# Patient Record
Sex: Male | Born: 1935
Health system: Southern US, Community
[De-identification: ages and names within clinical notes are randomized; demographics above are authoritative.]

## PROBLEM LIST (undated history)

## (undated) DIAGNOSIS — I2510 Atherosclerotic heart disease of native coronary artery without angina pectoris: Secondary | ICD-10-CM

## (undated) DIAGNOSIS — Z77090 Contact with and (suspected) exposure to asbestos: Secondary | ICD-10-CM

## (undated) DIAGNOSIS — F419 Anxiety disorder, unspecified: Secondary | ICD-10-CM

## (undated) DIAGNOSIS — C61 Malignant neoplasm of prostate: Secondary | ICD-10-CM

## (undated) DIAGNOSIS — J309 Allergic rhinitis, unspecified: Secondary | ICD-10-CM

## (undated) DIAGNOSIS — I10 Essential (primary) hypertension: Secondary | ICD-10-CM

## (undated) DIAGNOSIS — J189 Pneumonia, unspecified organism: Secondary | ICD-10-CM

## (undated) DIAGNOSIS — E0590 Thyrotoxicosis, unspecified without thyrotoxic crisis or storm: Secondary | ICD-10-CM

## (undated) DIAGNOSIS — K219 Gastro-esophageal reflux disease without esophagitis: Secondary | ICD-10-CM

## (undated) DIAGNOSIS — M1990 Unspecified osteoarthritis, unspecified site: Secondary | ICD-10-CM

## (undated) DIAGNOSIS — I739 Peripheral vascular disease, unspecified: Secondary | ICD-10-CM

## (undated) DIAGNOSIS — E039 Hypothyroidism, unspecified: Secondary | ICD-10-CM

## (undated) DIAGNOSIS — M545 Low back pain, unspecified: Secondary | ICD-10-CM

## (undated) DIAGNOSIS — I6529 Occlusion and stenosis of unspecified carotid artery: Secondary | ICD-10-CM

## (undated) DIAGNOSIS — D649 Anemia, unspecified: Secondary | ICD-10-CM

## (undated) DIAGNOSIS — H9190 Unspecified hearing loss, unspecified ear: Secondary | ICD-10-CM

## (undated) DIAGNOSIS — F0390 Unspecified dementia without behavioral disturbance: Secondary | ICD-10-CM

## (undated) DIAGNOSIS — C649 Malignant neoplasm of unspecified kidney, except renal pelvis: Secondary | ICD-10-CM

## (undated) DIAGNOSIS — F32A Depression, unspecified: Secondary | ICD-10-CM

## (undated) DIAGNOSIS — L409 Psoriasis, unspecified: Secondary | ICD-10-CM

## (undated) DIAGNOSIS — K649 Unspecified hemorrhoids: Secondary | ICD-10-CM

## (undated) DIAGNOSIS — E782 Mixed hyperlipidemia: Secondary | ICD-10-CM

## (undated) DIAGNOSIS — F329 Major depressive disorder, single episode, unspecified: Secondary | ICD-10-CM

## (undated) DIAGNOSIS — J449 Chronic obstructive pulmonary disease, unspecified: Secondary | ICD-10-CM

## (undated) DIAGNOSIS — Z8619 Personal history of other infectious and parasitic diseases: Secondary | ICD-10-CM

## (undated) HISTORY — PX: CORONARY ANGIOPLASTY WITH STENT PLACEMENT: SHX49

## (undated) HISTORY — PX: POPLITEAL SYNOVIAL CYST EXCISION: SUR555

## (undated) HISTORY — DX: Atherosclerotic heart disease of native coronary artery without angina pectoris: I25.10

## (undated) HISTORY — PX: NASAL SEPTUM SURGERY: SHX37

## (undated) HISTORY — PX: ESOPHAGOGASTRODUODENOSCOPY: SHX1529

## (undated) HISTORY — DX: Malignant neoplasm of unspecified kidney, except renal pelvis: C64.9

## (undated) HISTORY — DX: Anemia, unspecified: D64.9

## (undated) HISTORY — DX: Depression, unspecified: F32.A

## (undated) HISTORY — DX: Contact with and (suspected) exposure to asbestos: Z77.090

## (undated) HISTORY — PX: COLONOSCOPY: SHX174

## (undated) HISTORY — DX: Thyrotoxicosis, unspecified without thyrotoxic crisis or storm: E05.90

## (undated) HISTORY — DX: Malignant neoplasm of prostate: C61

## (undated) HISTORY — PX: KNEE ARTHROPLASTY: SHX992

## (undated) HISTORY — PX: JOINT REPLACEMENT: SHX530

## (undated) HISTORY — DX: Peripheral vascular disease, unspecified: I73.9

## (undated) HISTORY — PX: CATARACT EXTRACTION W/ INTRAOCULAR LENS  IMPLANT, BILATERAL: SHX1307

## (undated) HISTORY — PX: CAROTID ENDARTERECTOMY: SUR193

## (undated) HISTORY — DX: Allergic rhinitis, unspecified: J30.9

## (undated) HISTORY — DX: Personal history of other infectious and parasitic diseases: Z86.19

## (undated) HISTORY — DX: Essential (primary) hypertension: I10

## (undated) HISTORY — PX: CARDIAC CATHETERIZATION: SHX172

## (undated) HISTORY — DX: Mixed hyperlipidemia: E78.2

## (undated) HISTORY — DX: Major depressive disorder, single episode, unspecified: F32.9

## (undated) HISTORY — DX: Occlusion and stenosis of unspecified carotid artery: I65.29

## (undated) HISTORY — DX: Chronic obstructive pulmonary disease, unspecified: J44.9

---

## 1976-05-01 DIAGNOSIS — E0590 Thyrotoxicosis, unspecified without thyrotoxic crisis or storm: Secondary | ICD-10-CM

## 1976-05-01 HISTORY — DX: Thyrotoxicosis, unspecified without thyrotoxic crisis or storm: E05.90

## 1993-10-29 HISTORY — PX: LAPAROSCOPIC CHOLECYSTECTOMY: SUR755

## 1999-05-02 DIAGNOSIS — Z8619 Personal history of other infectious and parasitic diseases: Secondary | ICD-10-CM

## 1999-05-02 HISTORY — DX: Personal history of other infectious and parasitic diseases: Z86.19

## 2000-02-03 ENCOUNTER — Encounter: Admission: RE | Admit: 2000-02-03 | Discharge: 2000-05-03 | Payer: Self-pay | Admitting: Anesthesiology

## 2000-08-17 ENCOUNTER — Encounter: Admission: RE | Admit: 2000-08-17 | Discharge: 2000-11-15 | Payer: Self-pay | Admitting: Anesthesiology

## 2001-11-29 ENCOUNTER — Inpatient Hospital Stay (HOSPITAL_COMMUNITY): Admission: EM | Admit: 2001-11-29 | Discharge: 2001-12-04 | Payer: Self-pay | Admitting: Dentistry

## 2001-12-17 ENCOUNTER — Inpatient Hospital Stay (HOSPITAL_COMMUNITY): Admission: EM | Admit: 2001-12-17 | Discharge: 2001-12-19 | Payer: Self-pay | Admitting: *Deleted

## 2002-03-28 ENCOUNTER — Ambulatory Visit (HOSPITAL_COMMUNITY): Admission: RE | Admit: 2002-03-28 | Discharge: 2002-03-28 | Payer: Self-pay | Admitting: Internal Medicine

## 2002-07-31 HISTORY — PX: BACK SURGERY: SHX140

## 2002-07-31 HISTORY — PX: ANTERIOR CERVICAL DECOMP/DISCECTOMY FUSION: SHX1161

## 2002-08-01 ENCOUNTER — Encounter: Payer: Self-pay | Admitting: Neurosurgery

## 2002-08-05 ENCOUNTER — Encounter: Payer: Self-pay | Admitting: Neurosurgery

## 2002-08-05 ENCOUNTER — Inpatient Hospital Stay (HOSPITAL_COMMUNITY): Admission: RE | Admit: 2002-08-05 | Discharge: 2002-08-06 | Payer: Self-pay | Admitting: Neurosurgery

## 2002-10-20 ENCOUNTER — Encounter: Admission: RE | Admit: 2002-10-20 | Discharge: 2002-10-20 | Payer: Self-pay | Admitting: Neurosurgery

## 2002-10-20 ENCOUNTER — Encounter: Payer: Self-pay | Admitting: Neurosurgery

## 2002-12-16 ENCOUNTER — Inpatient Hospital Stay (HOSPITAL_COMMUNITY): Admission: EM | Admit: 2002-12-16 | Discharge: 2002-12-17 | Payer: Self-pay | Admitting: Cardiology

## 2003-10-30 HISTORY — PX: PARTIAL KNEE ARTHROPLASTY: SHX2174

## 2003-11-26 ENCOUNTER — Inpatient Hospital Stay (HOSPITAL_COMMUNITY): Admission: RE | Admit: 2003-11-26 | Discharge: 2003-11-29 | Payer: Self-pay | Admitting: Orthopedic Surgery

## 2004-02-11 ENCOUNTER — Inpatient Hospital Stay (HOSPITAL_COMMUNITY): Admission: AD | Admit: 2004-02-11 | Discharge: 2004-02-18 | Payer: Self-pay | Admitting: Cardiology

## 2004-02-29 ENCOUNTER — Inpatient Hospital Stay (HOSPITAL_COMMUNITY): Admission: EM | Admit: 2004-02-29 | Discharge: 2004-03-01 | Payer: Self-pay | Admitting: *Deleted

## 2004-02-29 ENCOUNTER — Ambulatory Visit: Payer: Self-pay | Admitting: *Deleted

## 2004-03-11 ENCOUNTER — Ambulatory Visit: Payer: Self-pay | Admitting: Cardiology

## 2004-04-20 ENCOUNTER — Ambulatory Visit: Payer: Self-pay | Admitting: Cardiology

## 2004-05-06 ENCOUNTER — Ambulatory Visit: Payer: Self-pay | Admitting: Cardiology

## 2004-10-12 ENCOUNTER — Inpatient Hospital Stay (HOSPITAL_COMMUNITY): Admission: EM | Admit: 2004-10-12 | Discharge: 2004-10-13 | Payer: Self-pay | Admitting: Emergency Medicine

## 2004-10-12 ENCOUNTER — Ambulatory Visit: Payer: Self-pay | Admitting: Cardiology

## 2004-11-10 ENCOUNTER — Ambulatory Visit: Payer: Self-pay | Admitting: Cardiology

## 2005-04-03 ENCOUNTER — Ambulatory Visit: Payer: Self-pay | Admitting: Cardiology

## 2005-04-25 ENCOUNTER — Ambulatory Visit: Payer: Self-pay | Admitting: Cardiology

## 2005-09-07 ENCOUNTER — Ambulatory Visit: Payer: Self-pay | Admitting: Cardiology

## 2005-09-07 ENCOUNTER — Observation Stay (HOSPITAL_COMMUNITY): Admission: EM | Admit: 2005-09-07 | Discharge: 2005-09-10 | Payer: Self-pay | Admitting: Emergency Medicine

## 2005-09-14 ENCOUNTER — Ambulatory Visit: Payer: Self-pay | Admitting: Cardiology

## 2006-02-13 ENCOUNTER — Ambulatory Visit: Payer: Self-pay | Admitting: Cardiovascular Disease

## 2006-02-21 ENCOUNTER — Ambulatory Visit: Payer: Self-pay | Admitting: Cardiovascular Disease

## 2006-02-21 ENCOUNTER — Ambulatory Visit (HOSPITAL_COMMUNITY): Admission: RE | Admit: 2006-02-21 | Discharge: 2006-02-21 | Payer: Self-pay | Admitting: Cardiovascular Disease

## 2006-04-27 ENCOUNTER — Ambulatory Visit: Admission: RE | Admit: 2006-04-27 | Discharge: 2006-07-16 | Payer: Self-pay | Admitting: Radiation Oncology

## 2006-05-28 ENCOUNTER — Ambulatory Visit: Payer: Self-pay | Admitting: Cardiology

## 2006-05-31 ENCOUNTER — Ambulatory Visit: Payer: Self-pay | Admitting: Cardiology

## 2006-11-06 ENCOUNTER — Ambulatory Visit: Payer: Self-pay | Admitting: Cardiology

## 2006-11-07 ENCOUNTER — Ambulatory Visit: Payer: Self-pay | Admitting: Cardiology

## 2006-11-07 ENCOUNTER — Inpatient Hospital Stay (HOSPITAL_COMMUNITY): Admission: AD | Admit: 2006-11-07 | Discharge: 2006-11-09 | Payer: Self-pay | Admitting: Cardiology

## 2006-11-13 ENCOUNTER — Ambulatory Visit: Payer: Self-pay | Admitting: Cardiology

## 2007-02-14 ENCOUNTER — Ambulatory Visit: Payer: Self-pay | Admitting: Cardiology

## 2007-06-08 ENCOUNTER — Encounter: Payer: Self-pay | Admitting: Internal Medicine

## 2007-06-08 ENCOUNTER — Ambulatory Visit: Payer: Self-pay | Admitting: Cardiology

## 2007-08-30 HISTORY — PX: EYE SURGERY: SHX253

## 2007-09-21 ENCOUNTER — Emergency Department (HOSPITAL_COMMUNITY): Admission: EM | Admit: 2007-09-21 | Discharge: 2007-09-21 | Payer: Self-pay | Admitting: Emergency Medicine

## 2007-09-24 ENCOUNTER — Encounter: Payer: Self-pay | Admitting: Internal Medicine

## 2007-10-21 ENCOUNTER — Encounter: Payer: Self-pay | Admitting: Internal Medicine

## 2007-10-21 ENCOUNTER — Ambulatory Visit: Payer: Self-pay | Admitting: Cardiology

## 2007-10-25 ENCOUNTER — Inpatient Hospital Stay (HOSPITAL_BASED_OUTPATIENT_CLINIC_OR_DEPARTMENT_OTHER): Admission: RE | Admit: 2007-10-25 | Discharge: 2007-10-25 | Payer: Self-pay | Admitting: Internal Medicine

## 2007-10-25 ENCOUNTER — Ambulatory Visit: Payer: Self-pay | Admitting: Internal Medicine

## 2007-11-12 ENCOUNTER — Ambulatory Visit: Payer: Self-pay | Admitting: Cardiology

## 2007-11-29 ENCOUNTER — Ambulatory Visit: Payer: Self-pay | Admitting: Internal Medicine

## 2007-11-29 DIAGNOSIS — Z77090 Contact with and (suspected) exposure to asbestos: Secondary | ICD-10-CM | POA: Insufficient documentation

## 2007-11-29 DIAGNOSIS — J439 Emphysema, unspecified: Secondary | ICD-10-CM | POA: Insufficient documentation

## 2007-12-07 DIAGNOSIS — J309 Allergic rhinitis, unspecified: Secondary | ICD-10-CM | POA: Insufficient documentation

## 2007-12-09 ENCOUNTER — Telehealth: Payer: Self-pay | Admitting: Internal Medicine

## 2008-02-28 ENCOUNTER — Encounter: Payer: Self-pay | Admitting: Cardiology

## 2008-03-30 ENCOUNTER — Ambulatory Visit: Payer: Self-pay | Admitting: Internal Medicine

## 2008-05-07 ENCOUNTER — Ambulatory Visit: Payer: Self-pay | Admitting: Cardiology

## 2008-05-18 ENCOUNTER — Encounter: Payer: Self-pay | Admitting: Physician Assistant

## 2008-05-26 ENCOUNTER — Encounter: Payer: Self-pay | Admitting: Cardiology

## 2008-06-03 ENCOUNTER — Ambulatory Visit: Payer: Self-pay | Admitting: Cardiology

## 2008-06-04 ENCOUNTER — Encounter: Payer: Self-pay | Admitting: Cardiology

## 2008-06-08 ENCOUNTER — Encounter: Payer: Self-pay | Admitting: Cardiology

## 2008-06-25 ENCOUNTER — Ambulatory Visit: Payer: Self-pay | Admitting: Vascular Surgery

## 2008-06-30 ENCOUNTER — Ambulatory Visit (HOSPITAL_COMMUNITY): Admission: RE | Admit: 2008-06-30 | Discharge: 2008-06-30 | Payer: Self-pay | Admitting: Urology

## 2008-07-02 ENCOUNTER — Ambulatory Visit (HOSPITAL_COMMUNITY): Admission: RE | Admit: 2008-07-02 | Discharge: 2008-07-02 | Payer: Self-pay | Admitting: Urology

## 2008-08-11 ENCOUNTER — Ambulatory Visit: Payer: Self-pay | Admitting: Cardiology

## 2008-09-02 ENCOUNTER — Ambulatory Visit (HOSPITAL_COMMUNITY): Admission: RE | Admit: 2008-09-02 | Discharge: 2008-09-02 | Payer: Self-pay | Admitting: Ophthalmology

## 2008-09-24 ENCOUNTER — Ambulatory Visit (HOSPITAL_COMMUNITY): Admission: RE | Admit: 2008-09-24 | Discharge: 2008-09-24 | Payer: Self-pay | Admitting: Ophthalmology

## 2008-10-31 ENCOUNTER — Ambulatory Visit: Payer: Self-pay | Admitting: Cardiology

## 2008-10-31 ENCOUNTER — Encounter: Payer: Self-pay | Admitting: Cardiology

## 2008-11-06 ENCOUNTER — Encounter (INDEPENDENT_AMBULATORY_CARE_PROVIDER_SITE_OTHER): Payer: Self-pay | Admitting: *Deleted

## 2008-12-17 ENCOUNTER — Ambulatory Visit: Payer: Self-pay | Admitting: *Deleted

## 2008-12-17 ENCOUNTER — Encounter: Payer: Self-pay | Admitting: Cardiology

## 2008-12-22 DIAGNOSIS — I739 Peripheral vascular disease, unspecified: Secondary | ICD-10-CM

## 2008-12-22 DIAGNOSIS — I10 Essential (primary) hypertension: Secondary | ICD-10-CM | POA: Insufficient documentation

## 2008-12-22 DIAGNOSIS — E782 Mixed hyperlipidemia: Secondary | ICD-10-CM

## 2008-12-22 DIAGNOSIS — R0989 Other specified symptoms and signs involving the circulatory and respiratory systems: Secondary | ICD-10-CM

## 2008-12-22 DIAGNOSIS — I2510 Atherosclerotic heart disease of native coronary artery without angina pectoris: Secondary | ICD-10-CM | POA: Insufficient documentation

## 2008-12-28 ENCOUNTER — Ambulatory Visit: Payer: Self-pay | Admitting: Cardiology

## 2008-12-29 ENCOUNTER — Encounter: Payer: Self-pay | Admitting: Physician Assistant

## 2009-01-15 ENCOUNTER — Encounter (INDEPENDENT_AMBULATORY_CARE_PROVIDER_SITE_OTHER): Payer: Self-pay | Admitting: *Deleted

## 2009-02-01 ENCOUNTER — Encounter: Payer: Self-pay | Admitting: Physician Assistant

## 2009-02-01 ENCOUNTER — Ambulatory Visit: Payer: Self-pay | Admitting: Cardiology

## 2009-02-02 ENCOUNTER — Encounter (INDEPENDENT_AMBULATORY_CARE_PROVIDER_SITE_OTHER): Payer: Self-pay | Admitting: *Deleted

## 2009-02-03 ENCOUNTER — Encounter (INDEPENDENT_AMBULATORY_CARE_PROVIDER_SITE_OTHER): Payer: Self-pay | Admitting: *Deleted

## 2009-02-05 ENCOUNTER — Inpatient Hospital Stay (HOSPITAL_BASED_OUTPATIENT_CLINIC_OR_DEPARTMENT_OTHER): Admission: RE | Admit: 2009-02-05 | Discharge: 2009-02-05 | Payer: Self-pay | Admitting: Cardiovascular Disease

## 2009-02-05 ENCOUNTER — Ambulatory Visit: Payer: Self-pay | Admitting: Cardiovascular Disease

## 2009-02-25 ENCOUNTER — Ambulatory Visit: Payer: Self-pay | Admitting: Cardiology

## 2009-02-25 DIAGNOSIS — F329 Major depressive disorder, single episode, unspecified: Secondary | ICD-10-CM

## 2009-05-01 HISTORY — PX: TOTAL KNEE ARTHROPLASTY: SHX125

## 2009-06-16 ENCOUNTER — Ambulatory Visit: Payer: Self-pay | Admitting: Vascular Surgery

## 2009-07-08 ENCOUNTER — Telehealth (INDEPENDENT_AMBULATORY_CARE_PROVIDER_SITE_OTHER): Payer: Self-pay | Admitting: *Deleted

## 2009-08-05 ENCOUNTER — Encounter: Payer: Self-pay | Admitting: Physician Assistant

## 2009-08-09 ENCOUNTER — Ambulatory Visit: Payer: Self-pay | Admitting: Internal Medicine

## 2009-08-12 ENCOUNTER — Encounter: Payer: Self-pay | Admitting: Internal Medicine

## 2009-10-01 ENCOUNTER — Encounter: Payer: Self-pay | Admitting: Cardiology

## 2009-10-05 ENCOUNTER — Encounter: Payer: Self-pay | Admitting: Internal Medicine

## 2009-10-07 ENCOUNTER — Ambulatory Visit: Payer: Self-pay | Admitting: Physician Assistant

## 2009-10-08 ENCOUNTER — Ambulatory Visit: Payer: Self-pay | Admitting: Internal Medicine

## 2009-10-08 DIAGNOSIS — F17200 Nicotine dependence, unspecified, uncomplicated: Secondary | ICD-10-CM

## 2009-11-29 ENCOUNTER — Inpatient Hospital Stay (HOSPITAL_COMMUNITY): Admission: RE | Admit: 2009-11-29 | Discharge: 2009-12-02 | Payer: Self-pay | Admitting: Orthopedic Surgery

## 2009-12-20 ENCOUNTER — Telehealth (INDEPENDENT_AMBULATORY_CARE_PROVIDER_SITE_OTHER): Payer: Self-pay | Admitting: *Deleted

## 2010-02-02 ENCOUNTER — Telehealth (INDEPENDENT_AMBULATORY_CARE_PROVIDER_SITE_OTHER): Payer: Self-pay | Admitting: *Deleted

## 2010-02-04 ENCOUNTER — Encounter: Payer: Self-pay | Admitting: Cardiology

## 2010-02-04 ENCOUNTER — Ambulatory Visit: Payer: Self-pay | Admitting: Vascular Surgery

## 2010-05-05 ENCOUNTER — Ambulatory Visit
Admission: RE | Admit: 2010-05-05 | Discharge: 2010-05-05 | Payer: Self-pay | Source: Home / Self Care | Attending: Cardiology | Admitting: Cardiology

## 2010-05-28 ENCOUNTER — Inpatient Hospital Stay (HOSPITAL_COMMUNITY)
Admission: EM | Admit: 2010-05-28 | Discharge: 2010-05-30 | Payer: Self-pay | Source: Home / Self Care | Attending: Cardiology | Admitting: Cardiology

## 2010-05-28 ENCOUNTER — Encounter: Payer: Self-pay | Admitting: Cardiology

## 2010-05-28 LAB — CBC
HCT: 34.7 % — ABNORMAL LOW (ref 39.0–52.0)
Hemoglobin: 12 g/dL — ABNORMAL LOW (ref 13.0–17.0)
MCH: 32.4 pg (ref 26.0–34.0)
MCHC: 34.6 g/dL (ref 30.0–36.0)
MCV: 93.8 fL (ref 78.0–100.0)
Platelets: 273 10*3/uL (ref 150–400)
RBC: 3.7 MIL/uL — ABNORMAL LOW (ref 4.22–5.81)
RDW: 14.1 % (ref 11.5–15.5)
WBC: 9.3 10*3/uL (ref 4.0–10.5)

## 2010-05-28 LAB — POCT CARDIAC MARKERS
CKMB, poc: <1 ng/mL — ABNORMAL LOW (ref 1.0–8.0)
CKMB, poc: <1 ng/mL — ABNORMAL LOW (ref 1.0–8.0)
Myoglobin, poc: 92 ng/mL (ref 12–200)
Myoglobin, poc: 55.5 ng/mL (ref 12–200)
Troponin i, poc: <0.05 ng/mL (ref 0.00–0.09)
Troponin i, poc: <0.05 ng/mL (ref 0.00–0.09)

## 2010-05-28 LAB — LIPID PANEL
Cholesterol: 128 mg/dL (ref 0–200)
HDL: 52 mg/dL (ref >39)
LDL Cholesterol: 65 mg/dL (ref 0–99)
Total CHOL/HDL Ratio: 2.5 RATIO
Triglycerides: 55 mg/dL (ref <150)
VLDL: 11 mg/dL (ref 0–40)

## 2010-05-28 LAB — DIFFERENTIAL
Basophils Absolute: 0.1 10*3/uL (ref 0.0–0.1)
Basophils Relative: 1 % (ref 0–1)
Eosinophils Absolute: 0.3 10*3/uL (ref 0.0–0.7)
Eosinophils Relative: 3 % (ref 0–5)
Lymphocytes Relative: 16 % (ref 12–46)
Lymphs Abs: 1.5 10*3/uL (ref 0.7–4.0)
Monocytes Absolute: 0.6 10*3/uL (ref 0.1–1.0)
Monocytes Relative: 7 % (ref 3–12)
Neutro Abs: 6.8 10*3/uL (ref 1.7–7.7)
Neutrophils Relative %: 74 % (ref 43–77)

## 2010-05-28 LAB — BASIC METABOLIC PANEL
BUN: 10 mg/dL (ref 6–23)
CO2: 25 mEq/L (ref 19–32)
Calcium: 9.2 mg/dL (ref 8.4–10.5)
Chloride: 99 mEq/L (ref 96–112)
Creatinine, Ser: 1.07 mg/dL (ref 0.4–1.5)
GFR calc Af Amer: >60 mL/min (ref >60)
GFR calc non Af Amer: >60 mL/min (ref >60)
Glucose, Bld: 104 mg/dL — ABNORMAL HIGH (ref 70–99)
Potassium: 3.6 mEq/L (ref 3.5–5.1)
Sodium: 134 mEq/L — ABNORMAL LOW (ref 135–145)

## 2010-05-28 LAB — BRAIN NATRIURETIC PEPTIDE: Pro B Natriuretic peptide (BNP): <30 pg/mL (ref 0.0–100.0)

## 2010-05-28 LAB — CK TOTAL AND CKMB (NOT AT ARMC)
CK, MB: 1.2 ng/mL (ref 0.3–4.0)
Relative Index: INVALID (ref 0.0–2.5)
Total CK: 61 U/L (ref 7–232)

## 2010-05-28 LAB — TSH: TSH: 2.165 u[IU]/mL (ref 0.350–4.500)

## 2010-05-28 LAB — PROTIME-INR
INR: 0.94 (ref 0.00–1.49)
Prothrombin Time: 12.8 seconds (ref 11.6–15.2)

## 2010-05-28 LAB — MRSA PCR SCREENING: MRSA by PCR: NEGATIVE

## 2010-05-28 LAB — TROPONIN I: Troponin I: <0.01 ng/mL (ref 0.00–0.06)

## 2010-05-28 LAB — APTT: aPTT: 33 seconds (ref 24–37)

## 2010-05-29 ENCOUNTER — Encounter: Payer: Self-pay | Admitting: Cardiology

## 2010-05-29 LAB — CBC
HCT: 37.6 % — ABNORMAL LOW (ref 39.0–52.0)
Hemoglobin: 12.5 g/dL — ABNORMAL LOW (ref 13.0–17.0)
Hemoglobin: 12.7 g/dL — ABNORMAL LOW (ref 13.0–17.0)
MCH: 32.2 pg (ref 26.0–34.0)
MCHC: 33.7 g/dL (ref 30.0–36.0)
MCHC: 33.8 g/dL (ref 30.0–36.0)
MCV: 95.9 fL (ref 78.0–100.0)
RDW: 14.4 % (ref 11.5–15.5)

## 2010-05-29 LAB — CK TOTAL AND CKMB (NOT AT ARMC)
CK, MB: 0.9 ng/mL (ref 0.3–4.0)
CK, MB: 1 ng/mL (ref 0.3–4.0)
Relative Index: INVALID (ref 0.0–2.5)
Total CK: 48 U/L (ref 7–232)

## 2010-05-29 LAB — TROPONIN I
Troponin I: 0.01 ng/mL (ref 0.00–0.06)
Troponin I: 0.03 ng/mL (ref 0.00–0.06)

## 2010-05-29 LAB — HEPARIN LEVEL (UNFRACTIONATED)
Heparin Unfractionated: 0.55 IU/mL (ref 0.30–0.70)
Heparin Unfractionated: <0.1 IU/mL — ABNORMAL LOW (ref 0.30–0.70)

## 2010-05-30 ENCOUNTER — Encounter: Payer: Self-pay | Admitting: Cardiology

## 2010-05-30 LAB — HEPARIN LEVEL (UNFRACTIONATED): Heparin Unfractionated: <0.1 IU/mL — ABNORMAL LOW (ref 0.30–0.70)

## 2010-05-30 LAB — CBC
HCT: 35.8 % — ABNORMAL LOW (ref 39.0–52.0)
Hemoglobin: 12.1 g/dL — ABNORMAL LOW (ref 13.0–17.0)
MCHC: 33.8 g/dL (ref 30.0–36.0)
MCV: 96 fL (ref 78.0–100.0)
WBC: 7.5 10*3/uL (ref 4.0–10.5)

## 2010-05-31 NOTE — Progress Notes (Signed)
Summary: PHONE;MEDICATIONS  Phone Note Call from Patient Call back at [phone]   Caller: Daughter-[name] Reason for Call: Talk to Nurse Summary of Call: has questions about medication and needs refills. Please call Esau Grew [phone] Initial call taken by: Zachary George,  February 02, 2010 10:00 AM  Follow-up for Phone Call        Patient notified.    Follow-up by: Hoover Brunette, LPN,  February 02, 2010 4:21 PM    Prescriptions: ISOSORBIDE MONONITRATE CR 60 MG XR24H-TAB (ISOSORBIDE MONONITRATE) Take one tablet by mouth daily  #30 x 6   Entered by:   Hoover Brunette, LPN   Authorized by:   Lewayne Bunting, MD, [facility]   Signed by:   Hoover Brunette, LPN on 84/69/6295   Method used:   Electronically to        [facility] (retail)       [address]       [place], [place]  [address]       Ph: [phone]       Fax: [phone]   RxID:   [id number]

## 2010-05-31 NOTE — Progress Notes (Signed)
Summary: phone:medication  Phone Note Call from Patient   Caller: Michael [name] Summary of Call: needs to verify dosage of Imdur  please call [name] EXT [phone] @ [facility] Initial call taken by: Zachary George,  December 20, 2009 9:11 AM  Follow-up for Phone Call        Daughter notified of correct dose, Imdur 60mg  daily. Hoover Brunette, LPN  December 21, 2009 8:51 AM

## 2010-05-31 NOTE — Letter (Signed)
Summary: [facility] Office Visit   [facility] Visit   Imported By: Roderic Ovens 02/14/2010 17:22:42  _____________________________________________________________________  External Attachment:    Type:   Image     Comment:   External Document

## 2010-05-31 NOTE — Assessment & Plan Note (Signed)
Summary: surgical clearance   Visit Type:  surgical clearance for left knee surgery Referring Provider:  Dr. Durene Romans Primary Provider:  Dr. Leandrew Koyanagi   History of Present Illness: 77 year old male, well known to [place] with history of CAD, normal LVF, and PAD, now referred for preoperative clearance.  Patient was last seen in clinic by Dr. Andee Lineman last October, following recent cardiac catheterization secondary to recurrent chest pain. This study yielded single-vessel CAD, with patent stents in the mid and distal RCA; normal LVF. At time of post hospital visit, Imdur was increased to 60 mg daily. Since then, patient has not had any recurrent angina pectoris. He has not had to take any nitroglycerin tablets. He has enrolled in the cardiac rehabilitation program, and is able to do 3 miles on a treadmill, with no associated chest pain. He is also able to climb a full flight of stairs without stopping, with only minimal exertional dyspnea. Of note, he does continue to smoke.  Preventive Screening-Counseling & Management  Alcohol-Tobacco     Smoking Status: current     Smoking Cessation Counseling: yes     Packs/Day: <1/3 PPD  Current Medications (verified): 1)  Advair Diskus 250-50 Mcg/dose  Misc (Fluticasone-Salmeterol) .... One Inhalations Two Times A Day 2)  Trazodone Hcl 100 Mg  Tabs (Trazodone Hcl) .... Take 1 By Mouth Every Evening 3)  Lipitor 40 Mg  Tabs (Atorvastatin Calcium) .... Take 1 By Mouth Once Daily 4)  Aspirin Adult Low Strength 81 Mg  Tbec (Aspirin) .... Take 1 By Mouth Once Daily 5)  Zantac 150 Mg  Tabs (Ranitidine Hcl) .... Take 1 By Mouth Two Times A Day 6)  Synthroid 125 Mcg  Tabs (Levothyroxine Sodium) .... Take 1 By Mouth Once Daily 7)  Ferrous Sulfate 325 (65 Fe) Mg Tabs (Ferrous Sulfate) .... Take 1 Tablet By Mouth Once A Day 8)  Flomax 0.4 Mg  Xr24h-Cap (Tamsulosin Hcl) .... Take 1 By Mouth Every Evening 9)  Amlodipine Besylate 10 Mg Tabs (Amlodipine Besylate) ....  Take One Tablet By Mouth Daily 10)  Hydrochlorothiazide 25 Mg Tabs (Hydrochlorothiazide) .... Take 1 By Mouth  Every Morning 11)  Celexa 20 Mg Tabs (Citalopram Hydrobromide) .... Take 1 By Mouth  Every Morning 12)  Multivitamins  Tabs (Multiple Vitamin) .... Take 1 By Mouth Once Daily 13)  Isosorbide Mononitrate Cr 60 Mg Xr24h-Tab (Isosorbide Mononitrate) .... Take One Tablet By Mouth Daily 14)  Nitroglycerin 0.4 Mg Subl (Nitroglycerin) .... Place 1 Tablet Under Tongue As Directed 15)  Fish Oil 1000 Mg Caps (Omega-3 Fatty Acids) .... Take 2 Capsules By Mouth Two Times A Day 16)  Spiriva Handihaler 18 Mcg Caps (Tiotropium Bromide Monohydrate) .Marland Kitchen.. 1 Daily  Allergies (verified): 1)  Pcn 2)  * Oxycodone 3)  Cipro 4)  * Nuiron  Comments:  Nurse/Medical Assistant: The patient's medication list and allergies were reviewed with the patient and were updated in the Medication and Allergy Lists.  Past History:  Past Medical History: Last updated: 12/22/2008 Hyperthyroid-RAI 1978 Shingles 2001 MI Prostate cancer -xrt/IMRT 2008 vitreous hemorrhage  PVD (ICD-443.9) BRUIT (ICD-785.9) CAD, NATIVE VESSEL (ICD-414.01) -stent x 3- one is drug eluting HYPERLIPIDEMIA-MIXED (ICD-272.4) HYPERTENSION, UNSPECIFIED (ICD-401.9) ALLERGIC RHINITIS (ICD-477.9) COPD (ICD-496) ASBESTOS EXPOSURE, HX OF (ICD-V15.84) with arbitration settlement  Social History: Packs/Day:  <1/3 PPD  Review of Systems       No fevers, chills, hemoptysis, dysphagia, melena, hematocheezia, hematuria, rash, claudication, orthopnea, pnd, pedal edema. All other systems negative.   Vital Signs:  Patient profile:   77 year old male Height:      68 inches Weight:      162 pounds Pulse rate:   82 / minute BP sitting:   121 / 72  (left arm) Cuff size:   regular  Vitals Entered By: Carlye Grippe (October 07, 2009 2:58 PM)   Physical Exam  Additional Exam:  GEN: 77 year old male, sitting upright, no distress HEENT:  NCAT,PERRLA,EOMI NECK: palpable pulses, loud right bruit, much softer on the left; no JVD; no TM LUNGS: CTA bilaterally HEART: RRR (S1S2); no significant murmurs; no rubs; no gallops ABD: soft, NT; intact BS EXT: intact distal pulses; 1+ lower extremity edema SKIN: warm, dry MUSC: no obvious deformity NEURO: A/O (x3)     EKG  Procedure date:  10/07/2009  Findings:      NSR with first degree AV block; 78 bpm; LAD; no ischemic changes  Impression & Recommendations:  Problem # 1:  CAD, NATIVE VESSEL (ICD-414.01)  patient presents with no signs or symptoms suggestive of unstable angina pectoris. He had a cardiac catheterization last year which revealed nonobstructive single-vessel CAD and normal LVEF. He is, therefore, cleared to proceed with recommended left TKR, with no further cardiac workup. He can safely come off low-dose aspirin, if needed, which he can then resume when cleared to do so. Will otherwise plan on having him return to Dr. Andee Lineman in 6 months, or sooner if needed  Problem # 2:  HYPERLIPIDEMIA-MIXED (ICD-272.4)  continue current medication regimen, with target LDL of 70 or less, if feasible.  Problem # 3:  PVD (ICD-443.9) patient is followed on a regular basis by the vascular surgical group in [place]. He reportedly had recent surveillance carotid Dopplers, which suggested stable anatomy.  Problem # 4:  HYPERTENSION, UNSPECIFIED (ICD-401.9)  well controlled on current medication regimen.  Other Orders: EKG w/ Interpretation (93000)  Patient Instructions: 1)  Your physician wants you to follow-up in: 6 months. You will receive a reminder letter in the mail one-two months in advance. If you don't receive a letter, please call our office to schedule the follow-up appointment. 2)  Your physician recommends that you continue on your current medications as directed. Please refer to the Current Medication list given to you today.

## 2010-05-31 NOTE — Progress Notes (Signed)
Summary: requests PFT- appts have been scheduled  Phone Note Call from Patient Call back at Home Phone [phone]   Caller: Patient Call For: young Summary of Call: pt states that he needs a pft and f/u w/ cy because he received a letter re: asbestos exposure (time for another breathing test per pt). please advise if dr young wants pt top have a pft.  Initial call taken by: Tivis Ringer, CNA,  July 08, 2009 12:34 PM  Follow-up for Phone Call        Pt states he received a letter from his job the he is due for another pft and f/u with [facility].  Pt alst seen 03/2008. Please advise if you want to order pft for this pt. Carron Curie CMA  July 08, 2009 12:38 PM   Additional Follow-up for Phone Call Additional follow up Details #1::        Wil schedule PFT and return visit Additional Follow-up by: Waymon Budge MD,  July 08, 2009 1:35 PM    Additional Follow-up for Phone Call Additional follow up Details #2::    Please schedule PFT and ROV with [facility]. Reynaldo Minium CMA  July 08, 2009 1:40 PM   Will forward to scheduler MW to make this appt Vernie Murders  July 08, 2009 1:50 PM  since Nicholos Johns took original msg I will forward to her to schedule this pft and rov for pt and call pt with appts  Philipp Deputy [facility]  July 08, 2009 3:43 PM   Additional Follow-up for Phone Call Additional follow up Details #3:: Details for Additional Follow-up Action Taken: Pt has a PFT/ f/u scheduled for 08/09/09. Tivis Ringer, CNA  July 08, 2009 4:00 PM

## 2010-05-31 NOTE — Miscellaneous (Signed)
Summary: Orders Update pft charges  Clinical Lists Changes  Orders: Added new Service order of Carbon Monoxide diffusing w/capacity (94720) - Signed Added new Service order of Lung Volumes (94240) - Signed Added new Service order of Spirometry (Pre & Post) (94060) - Signed 

## 2010-05-31 NOTE — Assessment & Plan Note (Signed)
Summary: SURGERY CLEARANCE//kp   Copy to:  Dr. Durene Romans Primary Provider/Referring Provider:  Dr. Leandrew Koyanagi  CC:  Surgery clearance-Left Knee.  History of Present Illness: History of Present Illness: 03/25/08- Asbestos exposure, chronic monitoring per settlement Feels stable with no change. Denies cough, chest pain, discolored sputum. Had seasonal flu vax and recently resolved an incidental cold. Went again to [facility] in [place] who did repeat PFT. Results not available. Had cxr in [place]. Says he also had w/u in [facility] for diarrhea w/ many tests, neg findings.   August 09, 2009- Asbestos exposure, chronic monitoring per settlement Just had another CXR at [facility] because of hard coughing x 3 weeks. He was given a new inhaler at the ER used as needed. He doesn't know the name of the medicine or exact instructions. He says cough will come and go, usually with scant mucus. Chest rattles with no wheeze. Cough sometimes productive yellow- never bloody or frankly purulent. Denies chest pain, palpitation, ankle edema, nodes. He also takes Clairitn-D for rhinitis. He estimates he is more dyspneic with exertion over past year.  PFT- Moderate obstuctive disease, especially in small airways. FEV1 2.25L, FEV1/FVC 0.57, with response to bronchodilator. Some airtrapping c/w obstructive disease is indicated by increased Residual Volume. Total Lung Capacity is not restricted. DLCO mildly reduced.  October 08, 2009- Asbestos exposure/ chronic monitoring per settlement, COPD, CAD.................daughter here Needs preop clearance anticipating TKR left knee. No hx repiratory problems with previous surgeries.  Again deneis any acute change in breathing or any chest pain,palpitation or swelling.   He continues to smoke, indicates he is trying to quit. We suggested the nicotine patches.     Preventive Screening-Counseling & Management  Alcohol-Tobacco     Smoking Status: current-60 years  Packs/Day: 0.75     Tobacco Counseling: to quit use of tobacco products  Current Medications (verified): 1)  Advair Diskus 250-50 Mcg/dose  Misc (Fluticasone-Salmeterol) .... One Inhalations Two Times A Day 2)  Trazodone Hcl 100 Mg  Tabs (Trazodone Hcl) .... Take 1 By Mouth Every Evening 3)  Lipitor 40 Mg  Tabs (Atorvastatin Calcium) .... Take 1 By Mouth Once Daily 4)  Aspirin Adult Low Strength 81 Mg  Tbec (Aspirin) .... Take 1 By Mouth Once Daily 5)  Zantac 150 Mg  Tabs (Ranitidine Hcl) .... Take 1 By Mouth Two Times A Day 6)  Synthroid 125 Mcg  Tabs (Levothyroxine Sodium) .... Take 1 By Mouth Once Daily 7)  Ferrous Sulfate 325 (65 Fe) Mg Tabs (Ferrous Sulfate) .... Take 1 Tablet By Mouth Once A Day 8)  Flomax 0.4 Mg  Xr24h-Cap (Tamsulosin Hcl) .... Take 1 By Mouth Every Evening 9)  Amlodipine Besylate 10 Mg Tabs (Amlodipine Besylate) .... Take One Tablet By Mouth Daily 10)  Hydrochlorothiazide 25 Mg Tabs (Hydrochlorothiazide) .... Take 1 By Mouth  Every Morning 11)  Celexa 20 Mg Tabs (Citalopram Hydrobromide) .... Take 1 By Mouth  Every Morning 12)  Multivitamins  Tabs (Multiple Vitamin) .... Take 1 By Mouth Once Daily 13)  Isosorbide Mononitrate Cr 60 Mg Xr24h-Tab (Isosorbide Mononitrate) .... Take One Tablet By Mouth Daily 14)  Nitroglycerin 0.4 Mg Subl (Nitroglycerin) .... Place 1 Tablet Under Tongue As Directed 15)  Fish Oil 1000 Mg Caps (Omega-3 Fatty Acids) .... Take 2 Capsules By Mouth Two Times A Day 16)  Spiriva Handihaler 18 Mcg Caps (Tiotropium Bromide Monohydrate) .Marland Kitchen.. 1 Daily  Allergies (verified): 1)  Pcn 2)  * Oxycodone 3)  Cipro 4)  *  Nuiron  Past History:  Past Medical History: Last updated: 12/22/2008 Hyperthyroid-RAI 1978 Shingles 2001 MI Prostate cancer -xrt/IMRT 2008 vitreous hemorrhage  PVD (ICD-443.9) BRUIT (ICD-785.9) CAD, NATIVE VESSEL (ICD-414.01) -stent x 3- one is drug eluting HYPERLIPIDEMIA-MIXED (ICD-272.4) HYPERTENSION, UNSPECIFIED  (ICD-401.9) ALLERGIC RHINITIS (ICD-477.9) COPD (ICD-496) ASBESTOS EXPOSURE, HX OF (ICD-V15.84) with arbitration settlement  Past Surgical History: Last updated: 11/29/2007 Baker's cyst-knee Cholecystectomy Cervical disk fusion Right partial knee replacement SMR/ nasal septoplasty  Family History: Last updated: 12/22/2008 Allergies Heart disease cancer Family History of Coronary Artery Disease:   Social History: Last updated: 10/08/2009 Patient is a current smoker- counseled Widowed Retired Cabin crew, Psychologist, occupational, maintenance Alcohol Use - no  Risk Factors: Smoking Status: current-60 years (10/08/2009) Packs/Day: 0.75 (10/08/2009)  Social History: Patient is a current smoker- counseled Widowed Retired Cabin crew, Psychologist, occupational, maintenance Alcohol Use - no Packs/Day:  0.75 Smoking Status:  current-60 years  Review of Systems      See HPI       The patient complains of shortness of breath with activity and productive cough.  The patient denies shortness of breath at rest, non-productive cough, coughing up blood, chest pain, irregular heartbeats, acid heartburn, indigestion, loss of appetite, weight change, abdominal pain, difficulty swallowing, sore throat, tooth/dental problems, headaches, nasal congestion/difficulty breathing through nose, and sneezing.    Vital Signs:  Patient profile:   77 year old male Height:      68 inches Weight:      164 pounds BMI:     25.03 O2 Sat:      94 % on Room air Pulse rate:   84 / minute BP sitting:   114 / 60  (left arm) Cuff size:   regular  Vitals Entered By: Reynaldo Minium CMA (October 08, 2009 1:54 PM)  O2 Flow:  Room air CC: Surgery clearance-Left Knee   Physical Exam  Additional Exam:  General: A/Ox3; pleasant and cooperative, NAD, WDWN SKIN: no rash, lesions NODES: no lymphadenopathy HEENT: /AT, EOM- WNL, Conjuctivae- clear, PERRLA, TM-WNL, Nose- clear, Throat- clear and wnl, dentures, Mallampati  III NECK: Supple w/ fair ROM, JVD-  none, normal carotid impulses w/o bruits Thyroid- normal to palpation.  CHEST: Some raspy coarse breath sounds, unlabored, no dullness HEART: RRR, no m/g/r heard ABDOMEN: Soft and nl; DGU:YQIH, nl pulses, no edema, cyanosis or clubbing NEURO: Grossly intact to observation      Impression & Recommendations:  Problem # 1:  COPD (ICD-496) Moderately severe COPD with no acute change. There is bronchospastic component based on lung sounds.  He is at more than average risk of surgical complications due to his COPD and ongoing smoking. I have again discussed the smoking with daughter present. I believe he will get through the surgery and see no measures to take now, other than smoking cessation, that would sighnificantly imorve his surgical risk.  Problem # 2:  ALLERGIC RHINITIS (ICD-477.9)  Complains of increased nasal congestion blamed on weather. We discussed Nasonex and he will try it.  His updated medication list for this problem includes:    Nasonex 50 Mcg/act Susp (Mometasone furoate) .Marland Kitchen... 2 sprays each nostril once daily at bedtime  Problem # 3:  TOBACCO USER (ICD-305.1) We discussed cessation. I asked daughter to stop on the way home and pick up some 21 mg patches.  Medications Added to Medication List This Visit: 1)  Nasonex 50 Mcg/act Susp (Mometasone furoate) .... 2 sprays each nostril once daily at bedtime  Other Orders: Est. Patient Level IV (47425)  Patient Instructions: 1)  Keep scheduled appointment 2)  You are stable for necessary surgery 3)  Try sample/ script Nasonex; 2 sprays each nostril once daily at bedtime. 4)  Any that you can cut down somoking between now and surgery will help. Consider the 21 mg otc nicotine patches. Prescriptions: NASONEX 50 MCG/ACT SUSP (MOMETASONE FUROATE) 2 sprays each nostril once daily at bedtime  #1 x prn   Entered and Authorized by:   Waymon Budge MD   Signed by:   Waymon Budge MD on 10/08/2009   Method used:   Print then  Give to Patient   RxID:   [id number]

## 2010-05-31 NOTE — Letter (Signed)
Summary: [facility]  [address]   [place], [place] [address]   Phone: [phone]  Fax: [phone]    08/12/2009    Dear Dr. Leandrew Koyanagi,      The followinfg information is being sent to your office at the request of the patient. If you should have any questions or concerns please feel free to call the office at [phone]; ask to speak with Dr. Jetty Duhamel or his nurse.            Sincerely,       Reynaldo Minium CMA Nurse for Jetty Duhamel, MD

## 2010-05-31 NOTE — Letter (Signed)
Summary: [facility]  [address]   [place], [place] [address]   Phone: [phone]  Fax: [phone]    08/12/2009    Ervin Knack,      The following documents are being sent to your office at our patient's request. If you should have any questions or concerns please feel free to call our office at [phone] and ask to speak with Dr. Jetty Duhamel or his nurse.         Sincerely,       Reynaldo Minium, CMA  Nurse for Jetty Duhamel, MD

## 2010-05-31 NOTE — Letter (Signed)
Summary: External Correspondence/ OFFICE VISIT [facility]  External Correspondence/ OFFICE VISIT [facility]   Imported By: Dorise Hiss 10/14/2009 17:02:01  _____________________________________________________________________  External Attachment:    Type:   Image     Comment:   External Document

## 2010-05-31 NOTE — Assessment & Plan Note (Signed)
Summary: ROV AFTER PFT ///KP   Copy to:  Orvilla Cornwall, FNP Primary Provider/Referring Provider:  Dr. Leandrew Koyanagi  CC:  Follow up visit after PFT.Marland Kitchen  History of Present Illness: 03/25/08- Asbestos exposure, chronic monitoring per settlement Feels stable with no change. Denies cough, chest pain, discolored sputum. Had seasonal flu vax and recently resolved an incidental cold. Went again to [facility] in [place] who did repeat PFT. Results not available. Had cxr in [place]. Says he also had w/u in [facility] for diarrhea w/ many tests, neg findings.   August 09, 2009- Asbestos exposure, chronic monitoring per settlement Just had another CXR at [facility] because of hard coughing x 3 weeks. He was given a new inhaler at the ER used as needed. He doesn't know the name of the medicine or exact instructions. He says cough will come and go, usually with scant mucus. Chest rattles with no wheeze. Cough sometimes productive yellow- never bloody or frankly purulent. Denies chest pain, palpitation, ankle edema, nodes. He also takes Clairitn-D for rhinitis. He estimates he is more dyspneic with exertion over past year.  PFT- Moderate obstuctive disease, especially in small airways. FEV1 2.25L, FEV1/FVC 0.57, with response to bronchodilator. Some airtrapping c/w obstructive disease is indicated by increased Residual Volume. Total Lung Capacity is not restricted. DLCO mildly reduced.   Current Medications (verified): 1)  Advair Diskus 250-50 Mcg/dose  Misc (Fluticasone-Salmeterol) .... One Inhalations Two Times A Day 2)  Trazodone Hcl 100 Mg  Tabs (Trazodone Hcl) .... Take 1 By Mouth Every Evening 3)  Lipitor 40 Mg  Tabs (Atorvastatin Calcium) .... Take 1 By Mouth Once Daily 4)  Aspirin Adult Low Strength 81 Mg  Tbec (Aspirin) .... Take 1 By Mouth Once Daily 5)  Zantac 150 Mg  Tabs (Ranitidine Hcl) .... Take 1 By Mouth Two Times A Day 6)  Synthroid 125 Mcg  Tabs (Levothyroxine Sodium) .... Take 1 By  Mouth Once Daily 7)  Ferrous Sulfate 325 (65 Fe) Mg Tabs (Ferrous Sulfate) .... Take 1 Tablet By Mouth Once A Day 8)  Flomax 0.4 Mg  Xr24h-Cap (Tamsulosin Hcl) .... Take 1 By Mouth Every Evening 9)  Amlodipine Besylate 10 Mg Tabs (Amlodipine Besylate) .... Take One Tablet By Mouth Daily 10)  Hydrochlorothiazide 25 Mg Tabs (Hydrochlorothiazide) .... Take 1 By Mouth  Every Morning 11)  Celexa 20 Mg Tabs (Citalopram Hydrobromide) .... Take 1 By Mouth  Every Morning 12)  Multivitamins  Tabs (Multiple Vitamin) .... Take 1 By Mouth Once Daily 13)  Isosorbide Mononitrate Cr 60 Mg Xr24h-Tab (Isosorbide Mononitrate) .... Take One Tablet By Mouth Daily 14)  Nitroglycerin 0.4 Mg Subl (Nitroglycerin) .... Place 1 Tablet Under Tongue As Directed 15)  Fish Oil 1000 Mg Caps (Omega-3 Fatty Acids) .... Take 2 Capsules By Mouth Two Times A Day  Allergies (verified): 1)  Pcn 2)  * Oxycodone 3)  Cipro 4)  * Nuiron  Past History:  Past Medical History: Last updated: 12/22/2008 Hyperthyroid-RAI 1978 Shingles 2001 MI Prostate cancer -xrt/IMRT 2008 vitreous hemorrhage  PVD (ICD-443.9) BRUIT (ICD-785.9) CAD, NATIVE VESSEL (ICD-414.01) -stent x 3- one is drug eluting HYPERLIPIDEMIA-MIXED (ICD-272.4) HYPERTENSION, UNSPECIFIED (ICD-401.9) ALLERGIC RHINITIS (ICD-477.9) COPD (ICD-496) ASBESTOS EXPOSURE, HX OF (ICD-V15.84) with arbitration settlement  Past Surgical History: Last updated: 11/29/2007 Baker's cyst-knee Cholecystectomy Cervical disk fusion Right partial knee replacement SMR/ nasal septoplasty  Family History: Last updated: 12/22/2008 Allergies Heart disease cancer Family History of Coronary Artery Disease:   Social History: Last updated: 12/22/2008 Patient is a  current smoker.  Widowed Retired Cabin crew, Psychologist, occupational, maintenance Alcohol Use - no  Risk Factors: Smoking Status: current (02/25/2009) Packs/Day: 1/4 PPD (02/25/2009)  Review of Systems      See HPI       The patient  complains of dyspnea on exertion and prolonged cough.  The patient denies anorexia, fever, weight loss, weight gain, vision loss, decreased hearing, hoarseness, chest pain, syncope, peripheral edema, headaches, hemoptysis, abdominal pain, and severe indigestion/heartburn.    Vital Signs:  Patient profile:   77 year old male Height:      68 inches Weight:      162 pounds BMI:     24.72 O2 Sat:      92 % on Room air Pulse rate:   87 / minute BP sitting:   112 / 64  (left arm) Cuff size:   regular  Vitals Entered By: Reynaldo Minium CMA (August 09, 2009 2:07 PM)  O2 Flow:  Room air  Physical Exam  Additional Exam:  General: A/Ox3; pleasant and cooperative, NAD, WDWN SKIN: no rash, lesions NODES: no lymphadenopathy HEENT: [place]/AT, EOM- WNL, Conjuctivae- clear, PERRLA, TM-WNL, Nose- clear, Throat- clear and wnl NECK: Supple w/ fair ROM, JVD- none, normal carotid impulses w/o bruits Thyroid- normal to palpation CHEST: Coarse bilateral rhonchi, no dullness, unlabored HEART: RRR, no m/g/r heard ABDOMEN: Soft and nl; UVO:ZDGU, nl pulses, no edema, cyanosis or clubbing NEURO: Grossly intact to observation      Impression & Recommendations:  Problem # 1:  COPD (ICD-496) Significant bronchitis component now, has apparently been this way through the winter. Zpak and a shot helped temporarily. He denies awareness of acid reflux. We discussed and will try Spiriva. I think the "shot" was probably a steroid. He doesn't describe fever or purulent sputum and I don't expect he would have much response to an antibiotic. He should be clearing. If not, he will need more asessment for this pattern. It is easier for him to get help through his primary physician in [place], but I can see him if needed. PFT also favors an obstructive, not restrictive pattern. There may be some room for additional bronchodilator.  Problem # 2:  ASBESTOS EXPOSURE, HX OF (ICD-V15.84)  The last CXR report available didn't comment  on any changes reflective of asbestos exposure. We will get the most recent report. Previous CT had indicated bilateral pleural thickening not appreciated on CXR.Marland Kitchen  Medications Added to Medication List This Visit: 1)  Spiriva Handihaler 18 Mcg Caps (Tiotropium bromide monohydrate) .Marland Kitchen.. 1 daily  Other Orders: Est. Patient Level III (44034)  Patient Instructions: 1)  Please schedule a follow-up appointment in 1 year- sooner  or call as needed. 2)  Try sample/ script Spiriva. See if this improves the cough and rattle in your chest. 3)  Permission to get your latest CXR report from [facility] Prescriptions: SPIRIVA HANDIHALER 18 MCG CAPS (TIOTROPIUM BROMIDE MONOHYDRATE) 1 daily  #30 x prn   Entered and Authorized by:   Waymon Budge MD   Signed by:   Waymon Budge MD on 08/09/2009   Method used:   Print then Give to Patient   RxID:   [id number]

## 2010-06-02 NOTE — Assessment & Plan Note (Signed)
Summary: 6 mo fu per dec reminder   Visit Type:  Follow-up Referring Provider:  Dr. Durene Romans Primary Provider:  Dr. Leandrew Koyanagi   History of Present Illness: the patient is a 77 year old male with history of coronary artery disease, normal LV function and bilateral carotid artery stenosis. The patient has single vessel coronary artery disease with patent stents in the mid and distal RCA. He has had no recurrent chest pain. He denies any shortness of breath orthopnea PND. He has no palpitations or syncope. He is doing very well from a cardiovascular perspective. The patient has history of asbestosis and is followed for this. He underwent a successful left total knee replacement last year.  The patient also has a new girlfriend and is planning to get married  Preventive Screening-Counseling & Management  Alcohol-Tobacco     Smoking Status: current     Packs/Day: <0.25  Problems Prior to Update: 1)  Depression  (ICD-311) 2)  Pvd  (ICD-443.9) 3)  Bruit  (ICD-785.9) 4)  Cad, Native Vessel  (ICD-414.01) 5)  Hyperlipidemia-mixed  (ICD-272.4) 6)  Hypertension, Unspecified  (ICD-401.9) 7)  Allergic Rhinitis  (ICD-477.9) 8)  Tobacco User  (ICD-305.1) 9)  COPD  (ICD-496) 10)  Asbestos Exposure, Hx of  (ICD-V15.84)  Current Medications (verified): 1)  Advair Diskus 250-50 Mcg/dose  Misc (Fluticasone-Salmeterol) .... One Inhalations Two Times A Day 2)  Trazodone Hcl 100 Mg  Tabs (Trazodone Hcl) .... Take 1 By Mouth Every Evening 3)  Lipitor 40 Mg  Tabs (Atorvastatin Calcium) .... Take 1 By Mouth Once Daily 4)  Aspirin Adult Low Strength 81 Mg  Tbec (Aspirin) .... Take 1 By Mouth Once Daily 5)  Zantac 150 Mg  Tabs (Ranitidine Hcl) .... Take 1 By Mouth Two Times A Day 6)  Synthroid 125 Mcg  Tabs (Levothyroxine Sodium) .... Take 1 By Mouth Once Daily 7)  Ferrous Sulfate 325 (65 Fe) Mg Tabs (Ferrous Sulfate) .... Take 1 Tablet By Mouth Once A Day 8)  Flomax 0.4 Mg  Xr24h-Cap (Tamsulosin Hcl)  .... Take 1 By Mouth Every Evening 9)  Amlodipine Besylate 10 Mg Tabs (Amlodipine Besylate) .... Take One Tablet By Mouth Daily 10)  Hydrochlorothiazide 25 Mg Tabs (Hydrochlorothiazide) .... Take 1 By Mouth  Every Morning 11)  Celexa 20 Mg Tabs (Citalopram Hydrobromide) .... Take 1 By Mouth  Every Morning 12)  Multivitamins  Tabs (Multiple Vitamin) .... Take 1 By Mouth Once Daily 13)  Isosorbide Mononitrate Cr 60 Mg Xr24h-Tab (Isosorbide Mononitrate) .... Take One Tablet By Mouth Daily 14)  Nitroglycerin 0.4 Mg Subl (Nitroglycerin) .... Place 1 Tablet Under Tongue As Directed 15)  Fish Oil 1000 Mg Caps (Omega-3 Fatty Acids) .... Take 2 Capsules By Mouth Two Times A Day 16)  Spiriva Handihaler 18 Mcg Caps (Tiotropium Bromide Monohydrate) .Marland Kitchen.. 1 Daily 17)  Nasonex 50 Mcg/act Susp (Mometasone Furoate) .... 2 Sprays Each Nostril Once Daily At Bedtime 18)  Albuterol Sulfate (5 Mg/ml) 0.5% Nebu (Albuterol Sulfate) .Marland Kitchen.. 1-2 Ouffs Every 4-6 Hours As Needed For Cugh/sob 19)  Guaifenesin 400 Mg Tabs (Guaifenesin) .Marland Kitchen.. 1tab in Am, 1 Tab in Pm For Congestion  Allergies: 1)  ! Norco (Hydrocodone-Acetaminophen) 2)  Pcn 3)  * Oxycodone 4)  Cipro 5)  * Nuiron  Comments:  Nurse/Medical Assistant: The patient's medications and allergies were reviewed with the patient and were updated in the Medication and Allergy Lists. reviewed list w/ pt. Tammi Romine CMA (May 05, 2010 3:01 PM)  Past History:  Past  Medical History: Last updated: 12/22/2008 Hyperthyroid-RAI 1978 Shingles 2001 MI Prostate cancer -xrt/IMRT 2008 vitreous hemorrhage  PVD (ICD-443.9) BRUIT (ICD-785.9) CAD, NATIVE VESSEL (ICD-414.01) -stent x 3- one is drug eluting HYPERLIPIDEMIA-MIXED (ICD-272.4) HYPERTENSION, UNSPECIFIED (ICD-401.9) ALLERGIC RHINITIS (ICD-477.9) COPD (ICD-496) ASBESTOS EXPOSURE, HX OF (ICD-V15.84) with arbitration settlement  Past Surgical History: Last updated: 11/29/2007 Baker's  cyst-knee Cholecystectomy Cervical disk fusion Right partial knee replacement SMR/ nasal septoplasty  Family History: Last updated: 12/22/2008 Allergies Heart disease cancer Family History of Coronary Artery Disease:   Social History: Last updated: 10/08/2009 Patient is a current smoker- counseled Widowed Retired Cabin crew, Psychologist, occupational, maintenance Alcohol Use - no  Risk Factors: Smoking Status: current (05/05/2010) Packs/Day: <0.25 (05/05/2010)  Social History: Smoking Status:  current Packs/Day:  <0.25  Review of Systems  The patient denies fatigue, malaise, fever, weight gain/loss, vision loss, decreased hearing, hoarseness, chest pain, palpitations, shortness of breath, prolonged cough, wheezing, sleep apnea, coughing up blood, abdominal pain, blood in stool, nausea, vomiting, diarrhea, heartburn, incontinence, blood in urine, muscle weakness, joint pain, leg swelling, rash, skin lesions, headache, fainting, dizziness, depression, anxiety, enlarged lymph nodes, easy bruising or bleeding, and environmental allergies.    Vital Signs:  Patient profile:   77 year old male Height:      68 inches Weight:      156 pounds BMI:     23.81 Pulse rate:   68 / minute BP sitting:   115 / 67  (left arm) Cuff size:   regular  Vitals Entered By: Fuller Plan CMA (May 05, 2010 3:02 PM)  Physical Exam  Additional Exam:  General: A/Ox3; pleasant and cooperative, NAD, WDWN SKIN: no rash, lesions NODES: no lymphadenopathy HEENT: [place]/AT, EOM- WNL, Conjuctivae- clear, PERRLA, TM-WNL, Nose- clear, Throat- clear and wnl, dentures, Mallampati  III NECK: Supple w/ fair ROM, JVD- none, normal carotid impulses w/o bruits Thyroid- normal to palpation.  CHEST: Some raspy coarse breath sounds, unlabored, no dullness HEART: RRR, no m/g/r heard ABDOMEN: Soft and nl; UVO:ZDGU, nl pulses, no edema, cyanosis or clubbing NEURO: Grossly intact to observation      Impression &  Recommendations:  Problem # 1:  PVD (ICD-443.9) followed by vascular surgery. The patient has bilateral carotid stenosis between 60 and 79%. Continue risk factor modification.  Problem # 2:  CAD, NATIVE VESSEL (ICD-414.01) the patient reports no angina. He is stable His updated medication list for this problem includes:    Aspirin Adult Low Strength 81 Mg Tbec (Aspirin) .Marland Kitchen... Take 1 by mouth once daily    Amlodipine Besylate 10 Mg Tabs (Amlodipine besylate) .Marland Kitchen... Take one tablet by mouth daily    Isosorbide Mononitrate Cr 60 Mg Xr24h-tab (Isosorbide mononitrate) .Marland Kitchen... Take one tablet by mouth daily    Nitroglycerin 0.4 Mg Subl (Nitroglycerin) .Marland Kitchen... Place 1 tablet under tongue as directed  Problem # 3:  TOBACCO USER (ICD-305.1) I counseled the patient again on his tobacco use.  Problem # 4:  HYPERLIPIDEMIA-MIXED (ICD-272.4) patient is on a statin for secondary risk prevention. His updated medication list for this problem includes:    Lipitor 40 Mg Tabs (Atorvastatin calcium) .Marland Kitchen... Take 1 by mouth once daily  Patient Instructions: 1)  Your physician recommends that you schedule a follow-up appointment in: 6 months 2)  Your physician recommends that you continue on your current medications as directed. Please refer to the Current Medication list given to you today.

## 2010-06-03 NOTE — Letter (Signed)
Summary: Pre-Op Clearance/[facility]  Pre-Op Clearance/[facility]   Imported By: Sherian Rein 10/19/2009 08:35:44  _____________________________________________________________________  External Attachment:    Type:   Image     Comment:   External Document

## 2010-06-08 NOTE — H&P (Signed)
NAMEVELDON, WAGER NO.:  [id number]  MEDICAL RECORD NO.:  [id number]          PATIENT TYPE:  INP  LOCATION:  2925                         FACILITY:  [facility]  PHYSICIAN:  Georga Hacking, M.D.DATE OF BIRTH:  12-Apr-1932  DATE OF ADMISSION:  05/28/2010                             HISTORY & PHYSICAL   The patient is a 77 year old male with known coronary artery disease. He has had stents placed in his right coronary artery on three occasions, the last being in October 2005.  He has had multiple catheterizations done since that time, the last one being February 05, 2009, at which time he had a 20% left main stenosis, 40% tubular stenosis in the LAD, no significant circumflex stenoses, the right coronary artery had minimal in-stent restenosis of 30-40% with 30% stenosis after that and normal LV function.  He has been under significant stress in the past.  He was in his usual state of health and awoke this morning of admission with midsternal chest discomfort and then developed fairly severe aching pain involving his right arm beginning at around 6:00 a.m.  He was brought to the [facility] Emergency Room and had a negative cardiac enzymes initially.  His chest discomfort has resolved, he is having minimal aching involving his right arm at the present time.  EKG was normal.  He is admitted to evaluate prolonged chest pain occurring at rest in a patient with known coronary artery disease.  He continues to smoke.  He has no symptoms of congestive heart failure at this time.  He does have significant peripheral vascular disease.  PAST MEDICAL HISTORY:  Remarkable for: 1. Hypertension. 2. Hyperlipidemia. 3. Previous history of hyperthyroidism, treated with radioactive     iodine in 1978. 4. Previous cervical disk disease with anterior cervical fusion in     April 2004. 5. Significant degenerative disease of his lower back with previous     steroid injections. 6.  Vitreous hemorrhage in his eye and a possible history of ulcers     previously. 7. He has moderate coronary artery disease. 8. Degenerative disease involving his knees. 9. He has COPD and has ongoing smoking and uses inhalers.  PAST SURGICAL HISTORY: 1. Bilateral knee replacement. 2. Cervical disk fusion. 3. Cholecystectomy in July 1995. 4. Baker cyst surgery in 1980. 5. Bilateral cataract extraction.  ALLERGIES:  PENICILLIN causes hives, OXYCODONE causes hives, CIPRO causes nausea, and NU-IRON causes itching, NORCO causes confusion and rash.  He has intolerance to [facility] and to MORPHINE.  CURRENT MEDICATIONS: 1. Ranitidine 150 mg q.a.m. 2. Amlodipine 10 mg daily. 3. Hydrochlorothiazide 25 mg daily. 4. Celexa 20 mg daily. 5. Levothyroxine 0.125 mg daily. 6. Aspirin 81 mg daily. 7. Ferrous sulfate 325 mg daily. 8. Advair Diskus 250/50 one puff b.i.d. 9. Lipitor 40 mg daily. 10.Flomax 0.4 mg daily. 11.Trazodone 100 mg nightly for sleep. 12.Isosorbide 60 mg before breakfast for chest pain. 13.Omega fish oil tablets two in the morning and the evening. 14.Multivitamins daily. 15.Nitroglycerin p.r.n. 16.Nasonex 2 sprays at bedtime. 17.Spiriva inhaler 1 puff daily. 18.ProAir 1-2 puffs q.4-6 hours as needed. 19.Zyrtec over the counter  recently begun.  SOCIAL HISTORY:  He currently lives alone.  He is a retired Psychologist, occupational from [employer].  He reports that he also has asbestosis.  He continues to smoke cigarettes less than one half pack per day and has smoked for over 60 years.  No significant alcohol usage.  FAMILY HISTORY:  Father died of a motor vehicle accident.  Mother died at age 54.  Three brothers had cardiac disease.  REVIEW OF SYSTEMS:  His weight has been stable.  He has had bilateral cataract extraction and has had vitreous hemorrhage previously.  He has no eye or nose or throat problems.  No difficulty swallowing.  No diarrhea, hematochezia, or melena.  He does have a  possible history of peptic ulcer disease.  He has some nocturia.  He has a history of prostate cancer treated with radiation therapy about 5 years ago.  He reports he is in remission.  He has had significant arthritic problems involving his neck although he does not currently have recurrent neck complaints.  He has significant bilateral knee problems and has had previous epidural steroid injections for his lumbar spine.  He has moderate dyspnea with exertion and has had a recent upper respiratory infection.  He has known hypothyroidism following treatment for hyperthyroidism.  He has a history of stroke or TIA.  He has moderate carotid artery stenosis and is followed by the vascular surgeon and has a 60-80% stenosis on the right and 40-60% on the left.  Other than as noted above, remainder of review of systems is unremarkable.  PHYSICAL EXAMINATION:  GENERAL:  He is a pleasant male, appearing stated age, currently in no acute distress. VITAL SIGNS:  Blood pressure is 135/70, pulse is 60 and regular. SKIN:  Warm and dry without obvious mass or lesion. HEENT:  EOMI.  PERRLA.  C and S clear.  Fundi not examined.  Pharynx is negative. NECK:  Supple without masses.  There is a right carotid bruit noted. There is no JVD noted. LUNGS:  Increased AP diameter.  No rales. CARDIOVASCULAR:  Normal S1 and S2.  No S3 or murmur.  PMI not displaced. ABDOMEN:  Soft and nontender without aneurysm.  There are bilateral femoral bruits noted.  Dorsalis pedis and posterior tibial pulses are reduced.  There is no peripheral edema noted, no venous varicosities noted. NEUROLOGIC:  Normal cranial nerves.  Sensory and motor are intact.  EKG shows left axis deviation but no acute ST changes. Initial point-of-care enzymes, CMET, and CBC were normal. Chest x-ray showed a possible right nipple shadow versus nodule.  IMPRESSION: 1. Prolonged chest discomfort with some residual aching in the arm     with negative  enzymes and EKG, rule out myocardial infarction or     unstable angina. 2. Coronary artery disease with 3 previous stents involving the right     coronary artery, patent at last catheterization in October 2010,     moderate disease in the left anterior descending with minimal     disease in the circumflex, normal left ventricular function. 3. Hypertension. 4. Chronic obstructive pulmonary disease with ongoing tobacco abuse,     advised to stop. 5. Hyperlipidemia, under treatment. 6. Carotid artery disease with 60-80% right carotid artery stenosis     and 40-60% left carotid stenosis. 7. Peripheral vascular disease with bilateral carotid bruits and     reduced pedal pulses. 8. History of prostate cancer, treated with radiation therapy. 9. Hypothyroidism following treatment for hyperthyroidism with  radioactive iodine. 10.Significant cervical disk disease with previous anterior cervical     operation and lumbar disk disease with previous epidural steroid     injections.  RECOMMENDATIONS:  The patient will be placed on heparin.  He will have serial enzymes drawn, question will be whether he will need a nuclear study versus repeat catheterization for evaluation.     Georga Hacking, M.D.     WST/MEDQ  D:  05/28/2010  T:  05/29/2010  Job:  [id number]  cc:   Learta Codding, MD,FACC Dr. Quintin Alto, [facility]  Electronically Signed by Lacretia Nicks. Donnie Aho M.D. on 06/08/2010 09:26:33 AM

## 2010-06-15 NOTE — Discharge Summary (Signed)
NAMEHARSHA, YUSKO NO.:  [id number]  MEDICAL RECORD NO.:  [id number]          PATIENT TYPE:  INP  LOCATION:  3739                         FACILITY:  [facility]  PHYSICIAN:  Cassell Clement, M.D. DATE OF BIRTH:  05-13-1931  DATE OF ADMISSION:  05/28/2010 DATE OF DISCHARGE:  05/30/2010                              DISCHARGE SUMMARY   DISCHARGE DIAGNOSIS:  Noncardiac chest pain. A.  Ruled out with serial negative cardiac enzymes.  The patient had a treadmill stress Myoview, May 30, 2010:  Left ventricular ejection fraction 70%, no wall motion abnormalities.  Normal treadmill Myoview exercise test.  SECONDARY DIAGNOSES: 1. Coronary artery disease.     a.     Multiple interventions to the right coronary, last one in      October 2005.     b.     Multiple catheterizations since interventions, last      catheterization on February 05, 2009:  20% left main, 40% 2 tubular      stenosis in the left anterior descending, no significant      circumflex stenosis, right coronary artery had minimal in-stent      restenosis of 30-40% with 30% stenosis after that and normal left      ventricular function. 2. Hypertension. 3. Hyperlipidemia. 4. Peripheral vascular disease.     a.     A 60-79% bilateral carotid artery stenosis.     b.     Decreased pedal pulses. 5. Ongoing tobacco. 6. Chronic obstructive pulmonary disease. 7. History of hyperparathyroidism.     a.     Treated with radioactive iodine, 1958. 8. Cervical disk disease with anterior cervical fusion, April 2004. 9. Degenerative disk disease in lower back with previous steroid     injections. 10.Degenerative joint disease involving knees bilaterally. 11.Vitreous hemorrhage in his eye. 12.Questionable history of ulcers.  PAST SURGICAL HISTORY: 1. Bilateral knee replacement. 2. Cervical disk fusion. 3. Cholecystectomy, July 1995. 4. Baker cyst surgery, 1980. 5. Bilateral cataract extraction.  ALLERGIES  AND INTOLERANCES: 1. PENICILLIN (hives). 2. LEVOFLOXACIN (nausea/vomiting). 3. GABAPENTIN (unknown reaction). 4. MORPHINE SULFATE (itching). 5. OXYCODONE (hives). 6. IRON COMPLEX (itching). 7. CIPROFLOXACIN (nausea).  PROCEDURES: 1. EKG, May 28, 2010:  Sinus bradycardia, 56 bpm, no significant     ST-T wave changes, nor any significant Q-waves with RSR prime     pattern in V1 and V2.  QRS of 94, borderline first-degree AV block     with PR of 212, QTC 421, left axis deviation without evidence of     hypertrophy. 2. A chest x-ray, May 28, 2010:  No acute cardiopulmonary process.     Emphysematous changes.  Nodular density projecting over the right     lower lobe may be nipple shadow ? 3. EKG, May 29, 2010:  No significant change from prior tracing. 4. Treadmill Myoview, May 30, 2010:  LVEF 70% with no wall motion     abnormalities.  Normal treadmill Myoview.  HISTORY OF PRESENT ILLNESS:  Mr. Cromartie is a 77 year old gentleman with the above-noted complex medical history who presented to [facility] ED on May 28, 2010, with  complaints of chest discomfort.  He has been in his usual state of health except for having increased stress recently when he awoke on the morning of his presentation with midsternal chest discomfort, and then he developed fairly severe aching pain involving his right arm beginning around 6:00 a.m.  He was brought to [facility] ED and has since had negative cardiac enzymes initially along with EKG without acute changes.  His chest discomfort had resolved by the time he was evaluated by Cardiology.  He still had residual right upper extremity aching.  Unfortunately, the patient admits to ongoing tobacco abuse despite multiple attempts at convincing the importance of cessation and encouraging him to quit entirely.  He denies any symptoms consistent with congestive heart failure.  He does have significant peripheral vascular disease on exam.  HOSPITAL  COURSE:  The patient was admitted and serial cardiac enzymes were negative.  He was set up for treadmill stress Myoview as soon as possible, which was completed on May 30, 2010, and was read and determined to be normal as described above by attending cardiologist, Dr. Cassell Clement.  As the patient had no recurrence of symptoms and no objective evidence of a cardiac etiology to the symptoms leading to his presentation, he was deemed stable for discharge.  He will be instructed to follow up with his primary care provider in the next 1-2 weeks.  He will also be set up to see his primary cardiologist, Dr. Lewayne Bunting, or his physician assistant, Gene Serpe, at [facility] in the next 3-4 weeks.  At the time of his discharge, the patient received his old medication list without changes, followup instructions, and all questions and concerns were addressed prior to him leaving the hospital.  DISCHARGE LABORATORY DATA:  WBC 7.5, HGB 12.1, HCT 35.8, PLT count is 252.  Protime 12.8, INR 0.94.  Sodium 134, potassium 3.6, chloride 99, bicarb 25, BUN 10, creatinine 1.07, glucose 104.  BNP less than 30. First set of point-of-care markers was negative and second 2 full sets of cardiac enzymes were also negative.  Total cholesterol 128, triglycerides 55, HDL 52, LDL 65, total cholesterol/HDL ratio 2.5.  TSH 2.165.  FOLLOWUP PLANS AND APPOINTMENTS:  Please see hospital course.  DISCHARGE MEDICATIONS: 1. Advair Diskus 250/50 one puff inhaled b.i.d. 2. Amlodipine 10 mg 1 tablet p.o. q.a.m. 3. Enteric coated aspirin 81 mg p.o. q.a.m. 4. Citalopram 20 mg 1 tablet p.o. q.a.m. 5. Ferrous sulfate 325 mg 1 tablet p.o. nightly. 6. Fish oil 1 g 2 capsules p.o. b.i.d. 7. Flomax 0.4 mg 1 capsule p.o. nightly. 8. Guaifenesin 400 mg 1 tablet p.o. b.i.d. 9. Hydrochlorothiazide 25 mg 1 tablet p.o. q.a.m. 10.Imdur 60 mg 1 tablet p.o. q.a.m. 11.Levothyroxine 125 mcg p.o. q.a.m. 12.Lipitor 40  mg 1 tablet p.o. nightly. 13.Multivitamin 1 tablet p.o. q.a.m. 14.Nasonex 2 sprays nasally nightly. 15.Sublingual nitroglycerin q.5 minutes up to 3 doses p.r.n. for chest     discomfort. 16.ProAir inhaler 1-2 puffs inhaled q.4 h. p.r.n. 17.Ranitidine 150 mg 1 tablet p.o. q.a.m. 18.Spiriva 18 mcg p.o. q.a.m. 19.Trazodone 100 mg 1 tablet p.o. nightly p.r.n. 20.Zyrtec 10 mg 1 tablet p.o. daily p.r.n.  DURATION OF DISCHARGE ENCOUNTER:  Including physician time was 35 minutes.     Jarrett Ables, PAC   ______________________________ Cassell Clement, M.D.    MS/MEDQ  D:  05/30/2010  T:  05/31/2010  Job:  [id number]  cc:   Learta Codding, MD,FACC Gene Serpe, PA-C Dr. Donavan Foil D.  Maple Hudson, MD, [facility], FACP  Electronically Signed by Jarrett Ables PAC on 06/08/2010 02:59:44 PM Electronically Signed by Cassell Clement M.D. on 06/15/2010 01:03:03 PM

## 2010-07-07 ENCOUNTER — Encounter: Payer: Self-pay | Admitting: Cardiology

## 2010-07-07 ENCOUNTER — Encounter (INDEPENDENT_AMBULATORY_CARE_PROVIDER_SITE_OTHER): Payer: Medicare Other | Admitting: Cardiology

## 2010-07-07 DIAGNOSIS — R072 Precordial pain: Secondary | ICD-10-CM

## 2010-07-07 DIAGNOSIS — I2510 Atherosclerotic heart disease of native coronary artery without angina pectoris: Secondary | ICD-10-CM

## 2010-07-08 ENCOUNTER — Encounter: Payer: Self-pay | Admitting: Cardiology

## 2010-07-15 LAB — CBC
Hemoglobin: 9.2 g/dL — ABNORMAL LOW (ref 13.0–17.0)
MCH: 33.4 pg (ref 26.0–34.0)
MCHC: 34.4 g/dL (ref 30.0–36.0)
Platelets: 217 10*3/uL (ref 150–400)
RBC: 2.74 MIL/uL — ABNORMAL LOW (ref 4.22–5.81)
RDW: 14.6 % (ref 11.5–15.5)
WBC: 7.7 10*3/uL (ref 4.0–10.5)

## 2010-07-15 LAB — BASIC METABOLIC PANEL
BUN: 9 mg/dL (ref 6–23)
Calcium: 8 mg/dL — ABNORMAL LOW (ref 8.4–10.5)
Calcium: 8.5 mg/dL (ref 8.4–10.5)
Chloride: 98 mEq/L (ref 96–112)
Creatinine, Ser: 1.17 mg/dL (ref 0.4–1.5)
Creatinine, Ser: 1.31 mg/dL (ref 0.4–1.5)
GFR calc Af Amer: >60 mL/min (ref >60)
GFR calc Af Amer: >60 mL/min (ref >60)
GFR calc non Af Amer: >60 mL/min (ref >60)
Potassium: 4.2 mEq/L (ref 3.5–5.1)
Sodium: 132 mEq/L — ABNORMAL LOW (ref 135–145)
Sodium: 136 mEq/L (ref 135–145)

## 2010-07-15 LAB — TYPE AND SCREEN
ABO/RH(D): O POS
Antibody Screen: NEGATIVE

## 2010-07-15 LAB — ABO/RH: ABO/RH(D): O POS

## 2010-07-16 LAB — DIFFERENTIAL
Basophils Absolute: 0.1 10*3/uL (ref 0.0–0.1)
Basophils Relative: 1 % (ref 0–1)
Eosinophils Absolute: 0.2 10*3/uL (ref 0.0–0.7)
Eosinophils Relative: 3 % (ref 0–5)
Monocytes Absolute: 0.9 10*3/uL (ref 0.1–1.0)
Monocytes Relative: 9 % (ref 3–12)
Neutro Abs: 7 10*3/uL (ref 1.7–7.7)

## 2010-07-16 LAB — BASIC METABOLIC PANEL
BUN: 10 mg/dL (ref 6–23)
CO2: 24 mEq/L (ref 19–32)
GFR calc non Af Amer: >60 mL/min (ref >60)
Glucose, Bld: 98 mg/dL (ref 70–99)
Potassium: 3.2 mEq/L — ABNORMAL LOW (ref 3.5–5.1)
Sodium: 128 mEq/L — ABNORMAL LOW (ref 135–145)

## 2010-07-16 LAB — CBC
HCT: 37.3 % — ABNORMAL LOW (ref 39.0–52.0)
Hemoglobin: 12.8 g/dL — ABNORMAL LOW (ref 13.0–17.0)
MCH: 33.5 pg (ref 26.0–34.0)
MCHC: 34.2 g/dL (ref 30.0–36.0)
RDW: 14.8 % (ref 11.5–15.5)

## 2010-07-16 LAB — URINALYSIS, ROUTINE W REFLEX MICROSCOPIC
Bilirubin Urine: NEGATIVE
Hgb urine dipstick: NEGATIVE
Ketones, ur: NEGATIVE mg/dL
Nitrite: NEGATIVE
Urobilinogen, UA: 0.2 mg/dL (ref 0.0–1.0)

## 2010-07-16 LAB — SURGICAL PCR SCREEN
MRSA, PCR: NEGATIVE
Staphylococcus aureus: NEGATIVE

## 2010-07-16 LAB — APTT: aPTT: 35 seconds (ref 24–37)

## 2010-07-19 NOTE — Letter (Signed)
Summary: External Correspondence/  [facility]  External Correspondence/  [facility]   Imported By: Dorise Hiss 07/11/2010 14:12:24  _____________________________________________________________________  External Attachment:    Type:   Image     Comment:   External Document

## 2010-07-19 NOTE — Assessment & Plan Note (Signed)
Summary: [facility]-POST [facility] SACH 1/30 PER [facility] -SRS   Visit Type:  Follow-up Referring Provider:  Dr. Durene Romans Primary Provider:  Dr. Leandrew Koyanagi   History of Present Illness: The patient was recently admitted to [facility].  He presented with atypical chest pain.  He ruled out for myocardial infarction.  Stress Myoview showed an ejection fraction of 70% no wall motion abnormalities and no ischemia. The patient's last cardiac catheterization was in October of 2010 which showed a 20% left main, 40% x 2 tubular stenosis in the left anterior descending artery, no significant circumflex stenosis, right coronary artery with minimal in-stent restenosis of 30 to 40% and normal LV function. The patient has a history of hypertension and hyperlipidemia as well as perform vascular disease and ongoing tobacco use.  He also has COPD. he now presents for follow up.  He denies any recurrent chest pain.  He did develop pneumonia in February and has been treated with prednisone.  He scheduled for a follow-up chest x-ray.  He really has no shortness of breath however.  He continues to smoke but he does use nicotine gum.   Preventive Screening-Counseling & Management  Alcohol-Tobacco     Smoking Status: current     Smoking Cessation Counseling: yes     Packs/Day: <1/4 PPD  Current Medications (verified): 1)  Advair Diskus 250-50 Mcg/dose  Misc (Fluticasone-Salmeterol) .... One Inhalations Two Times A Day 2)  Trazodone Hcl 100 Mg  Tabs (Trazodone Hcl) .... Take 1 By Mouth Every Evening 3)  Lipitor 40 Mg  Tabs (Atorvastatin Calcium) .... Take 1 By Mouth Once Daily 4)  Aspirin Adult Low Strength 81 Mg  Tbec (Aspirin) .... Take 1 By Mouth Once Daily 5)  Zantac 150 Mg  Tabs (Ranitidine Hcl) .... Take 1 Tablet By Mouth Once A Day 6)  Synthroid 125 Mcg  Tabs (Levothyroxine Sodium) .... Take 1 By Mouth Once Daily 7)  Ferrous Sulfate 325 (65 Fe) Mg Tabs (Ferrous Sulfate) .... Take 1 Tablet By Mouth Once A  Day 8)  Flomax 0.4 Mg  Xr24h-Cap (Tamsulosin Hcl) .... Take 1 By Mouth Every Evening 9)  Amlodipine Besylate 10 Mg Tabs (Amlodipine Besylate) .... Take One Tablet By Mouth Daily 10)  Hydrochlorothiazide 25 Mg Tabs (Hydrochlorothiazide) .... Take 1 By Mouth  Every Morning 11)  Celexa 20 Mg Tabs (Citalopram Hydrobromide) .... Take 1 By Mouth  Every Morning 12)  Multivitamins  Tabs (Multiple Vitamin) .... Take 1 By Mouth Once Daily 13)  Isosorbide Mononitrate Cr 60 Mg Xr24h-Tab (Isosorbide Mononitrate) .... Take One Tablet By Mouth Daily 14)  Nitroglycerin 0.4 Mg Subl (Nitroglycerin) .... Place 1 Tablet Under Tongue As Directed 15)  Fish Oil Maximum Strength 1200 Mg Caps (Omega-3 Fatty Acids) .... Take 2 Tablet By Mouth Two Times A Day 16)  Spiriva Handihaler 18 Mcg Caps (Tiotropium Bromide Monohydrate) .Marland Kitchen.. 1 Daily 17)  Nasonex 50 Mcg/act Susp (Mometasone Furoate) .... 2 Sprays Each Nostril Once Daily At Bedtime 18)  Proair Hfa 108 (90 Base) Mcg/act Aers (Albuterol Sulfate) .Marland Kitchen.. 1-2 Puffs Every 4-6 Hours As Needed Cough/sob 19)  Guaifenesin 400 Mg Tabs (Guaifenesin) .Marland Kitchen.. 1tab in Am, 1 Tab in Pm For Congestion 20)  Zyrtec Allergy 10 Mg Tabs (Cetirizine Hcl) .... Take 1 Tablet By Mouth Once A Day  Allergies (verified): 1)  ! Norco (Hydrocodone-Acetaminophen) 2)  Pcn 3)  * Oxycodone 4)  Cipro 5)  * Nuiron  Comments:  Nurse/Medical Assistant: The patient's medication list and allergies were  reviewed with the patient and were updated in the Medication and Allergy Lists.  Past History:  Past Medical History: Last updated: 12/22/2008 Hyperthyroid-RAI 1978 Shingles 2001 MI Prostate cancer -xrt/IMRT 2008 vitreous hemorrhage  PVD (ICD-443.9) BRUIT (ICD-785.9) CAD, NATIVE VESSEL (ICD-414.01) -stent x 3- one is drug eluting HYPERLIPIDEMIA-MIXED (ICD-272.4) HYPERTENSION, UNSPECIFIED (ICD-401.9) ALLERGIC RHINITIS (ICD-477.9) COPD (ICD-496) ASBESTOS EXPOSURE, HX OF (ICD-V15.84) with  arbitration settlement  Past Surgical History: Last updated: 11/29/2007 Baker's cyst-knee Cholecystectomy Cervical disk fusion Right partial knee replacement SMR/ nasal septoplasty  Family History: Last updated: 12/22/2008 Allergies Heart disease cancer Family History of Coronary Artery Disease:   Social History: Last updated: 10/08/2009 Patient is a current smoker- counseled Widowed Retired Cabin crew, Psychologist, occupational, maintenance Alcohol Use - no  Risk Factors: Smoking Status: current (07/07/2010) Packs/Day: <1/4 PPD (07/07/2010)  Social History: Packs/Day:  <1/4 PPD  Review of Systems  The patient denies fatigue, malaise, fever, weight gain/loss, vision loss, decreased hearing, hoarseness, chest pain, palpitations, shortness of breath, prolonged cough, wheezing, sleep apnea, coughing up blood, abdominal pain, blood in stool, nausea, vomiting, diarrhea, heartburn, incontinence, blood in urine, muscle weakness, joint pain, leg swelling, rash, skin lesions, headache, fainting, dizziness, depression, anxiety, enlarged lymph nodes, easy bruising or bleeding, and environmental allergies.    Vital Signs:  Patient profile:   77 year old male Height:      68 inches Weight:      159 pounds Pulse rate:   80 / minute BP sitting:   119 / 62  (left arm) Cuff size:   regular  Vitals Entered By: Carlye Grippe (July 07, 2010 1:17 PM)  Physical Exam  Additional Exam:  General: Well-developed, well-nourished in no distress head: Normocephalic and atraumatic eyes PERRLA/EOMI intact, conjunctiva and lids normal nose: No deformity or lesions mouth normal dentition, normal posterior pharynx neck: Supple, no JVD.  No masses, thyromegaly or abnormal cervical nodes lungs: Normal breath sounds bilaterally without wheezing.  Normal percussion heart: regular rate and rhythm with normal S1 and S2, no S3 or S4.  PMI is normal.  No pathological murmurs.normal2 abdomen: Normal bowel sounds, abdomen is  soft and nontender without masses, organomegaly or hernias noted.  No hepatosplenomegaly musculoskeletal: Back normal, normal gait muscle strength and tone normal pulsus: Pulse is normal in all 4 extremities Extremities: No peripheral pitting edema neurologic: Alert and oriented x 3 skin: Intact without lesions or rashes cervical nodes: No significant adenopathy psychologic: Normal affect    Impression & Recommendations:  Problem # 1:  CAD, NATIVE VESSEL (ICD-414.01) substernal chest pain.  No recurrent chest pain.  Negative recent Myoview study. coronary artery disease: Refer to details above.  Normal LV function  His updated medication list for this problem includes:    Aspirin Adult Low Strength 81 Mg Tbec (Aspirin) .Marland Kitchen... Take 1 by mouth once daily    Amlodipine Besylate 10 Mg Tabs (Amlodipine besylate) .Marland Kitchen... Take one tablet by mouth daily    Isosorbide Mononitrate Cr 60 Mg Xr24h-tab (Isosorbide mononitrate) .Marland Kitchen... Take one tablet by mouth daily    Nitroglycerin 0.4 Mg Subl (Nitroglycerin) .Marland Kitchen... Place 1 tablet under tongue as directed  Problem # 2:  PVD (ICD-443.9) peripheral vascular disease with 60 to 79% bilateral carotid artery stenosis.  Carotid Dopplers were done in 2010.  Reportedly the patient has carotid Dopplers every 6 months in [place] with vascular surgery.  Follow-up will be with his office.  Problem # 3:  HYPERLIPIDEMIA-MIXED (ICD-272.4) dyslipidemia: Followed by Dr. Leandrew Koyanagi.  Patient scheduled for blood  work in the next few weeks. His updated medication list for this problem includes:    Lipitor 40 Mg Tabs (Atorvastatin calcium) .Marland Kitchen... Take 1 by mouth once daily  Patient Instructions: 1)  Your physician recommends that you continue on your current medications as directed. Please refer to the Current Medication list given to you today. 2)  Follow up in  6 months

## 2010-08-08 ENCOUNTER — Other Ambulatory Visit: Payer: Self-pay | Admitting: *Deleted

## 2010-08-08 MED ORDER — ISOSORBIDE MONONITRATE ER 60 MG PO TB24: 60.0000 mg | ORAL_TABLET | Freq: Every day | ORAL | Status: DC

## 2010-08-10 LAB — BASIC METABOLIC PANEL
BUN: 16 mg/dL (ref 6–23)
CO2: 30 mEq/L (ref 19–32)
Chloride: 97 mEq/L (ref 96–112)
Creatinine, Ser: 1.33 mg/dL (ref 0.4–1.5)
Glucose, Bld: 88 mg/dL (ref 70–99)
Potassium: 4.2 mEq/L (ref 3.5–5.1)

## 2010-08-10 LAB — HEMOGLOBIN AND HEMATOCRIT, BLOOD: Hemoglobin: 13.5 g/dL (ref 13.0–17.0)

## 2010-08-11 LAB — CREATININE, SERUM
Creatinine, Ser: 1.21 mg/dL (ref 0.4–1.5)
GFR calc non Af Amer: 59 mL/min — ABNORMAL LOW (ref >60)

## 2010-08-15 ENCOUNTER — Ambulatory Visit (INDEPENDENT_AMBULATORY_CARE_PROVIDER_SITE_OTHER): Payer: Worker's Compensation | Admitting: Internal Medicine

## 2010-08-15 ENCOUNTER — Encounter: Payer: Self-pay | Admitting: Internal Medicine

## 2010-08-15 VITALS — BP 120/60 | HR 61 | Ht 68.0 in | Wt 163.6 lb

## 2010-08-15 DIAGNOSIS — F17200 Nicotine dependence, unspecified, uncomplicated: Secondary | ICD-10-CM

## 2010-08-15 DIAGNOSIS — J449 Chronic obstructive pulmonary disease, unspecified: Secondary | ICD-10-CM

## 2010-08-15 DIAGNOSIS — Z77090 Contact with and (suspected) exposure to asbestos: Secondary | ICD-10-CM

## 2010-08-15 MED ORDER — THEOPHYLLINE 100 MG PO TB12: 100.0000 mg | ORAL_TABLET | Freq: Two times a day (BID) | ORAL | Status: DC

## 2010-08-15 NOTE — Assessment & Plan Note (Signed)
Current smoker despite repeated counseling and education efforts. He is now concerned about asbestos / mesothelioma. Give abnl CXr recently, I will have him get CT.

## 2010-08-15 NOTE — Patient Instructions (Signed)
Order- CT chest non contrast- asbestos exposure. Carry CXR disk with you  Please keep trying to stop smoking. Maybe you can find some other ways to keep your hands busy.  Try adding theophylline  1 twice daily after meals.

## 2010-08-15 NOTE — Progress Notes (Signed)
  Subjective:    Patient ID: Michael [name], male    DOB: 12-Jan-1932, 77 y.o.   MRN: [id number]  HPI 77 yo M current smoker, coming for f/u of COPD and allergic rhinitis. Had pneumonia about a month ago and brings CXR from 07/18/10 done by [name], showing COPD but NAD on my screen. . Still smokes 1 Pack/ 3days.  Admits daily morning cough, usually clear or slight yellow sputum but no blood. Admits some SOB, but exercises 3 days/ week at rehab center. Wheezes some. Continues Advair, Spiriva. Proair doesn't help a lot. Had cardiac evaluation/ stress test by cardiologist- "ok".  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 He brings a newsletter from [employer] and from [place] and [place], and from them he asks about CT to assess for asbestos related changes.    Review of Systems See HPI Constitutional:   No weight loss, night sweats,  Fevers, chills, fatigue, lassitude. HEENT:   No headaches,  Difficulty swallowing,  Tooth/dental problems,  Sore throat,                No sneezing, itching, ear ache, nasal congestion, post nasal drip,   CV:  No chest pain,  Orthopnea, PND, swelling in lower extremities, anasarca, dizziness, palpitations  GI  No heartburn, indigestion, abdominal pain, nausea, vomiting, diarrhea, change in bowel habits, loss of appetite  Resp.  No excess mucus,,  No coughing up of blood.  No change in color of mucus.  No wheezing.  No chest wall deformity  Skin: no rash or lesions.  GU: no dysuria, change in color of urine, no urgency or frequency.  No flank pain.  MS:  No joint pain or swelling.  No decreased range of motion.  No back pain.  Psych:  No change in mood or affect. No depression or anxiety.  No memory loss.       Objective:   Physical Exam General- Alert, Oriented, Affect-appropriate, Distress- none acute  Skin- rash-none, lesions- none, excoriation- none  Lymphadenopathy- none  Head- atraumatic  Eyes-  Gross vision intact, PERRLA, conjunctivae clear secretions  Ears- Hearing Normal-canals, Tm L ,   R ,  Nose- Clear,  No-Septal dev, mucus, polyps, erosion, perforation   Throat- Mallampati II , mucosa clear , drainage- none, tonsils- atrophic  Neck- flexible , trachea midline, no stridor , thyroid nl, carotid no bruit  Chest - symmetrical excursion , unlabored     Heart/CV- RRR , no murmur , no gallop  , no rub, nl s1 s2                     - JVD- none , edema- none, stasis changes- none, varices- none     Lung-Coarse wheeze bilaterally, cough- none , dullness-none, rub- none. i can't hear fine crackles.     Chest wall- Abd- tender-no, distended-no, bowel sounds-present, HSM- no  Br/ Gen/ Rectal- Not done, not indicated  Extrem- cyanosis- none, clubbing, none, atrophy- none, strength- nl  Neuro- grossly intact to observation        Assessment & Plan:

## 2010-08-15 NOTE — Assessment & Plan Note (Signed)
Moderate residual bronchitis after pneumonia. I will add trial of theophylline.

## 2010-08-16 ENCOUNTER — Ambulatory Visit (INDEPENDENT_AMBULATORY_CARE_PROVIDER_SITE_OTHER)
Admission: RE | Admit: 2010-08-16 | Discharge: 2010-08-16 | Disposition: A | Discharge status: Home / Self Care | Payer: Worker's Compensation | Source: Ambulatory Visit | Attending: Internal Medicine | Admitting: Internal Medicine

## 2010-08-16 DIAGNOSIS — Z77090 Contact with and (suspected) exposure to asbestos: Secondary | ICD-10-CM

## 2010-08-18 ENCOUNTER — Encounter: Payer: Self-pay | Admitting: Internal Medicine

## 2010-08-18 NOTE — Assessment & Plan Note (Signed)
I re-emphasized the importance of stopping completely, and outlined supportive resources.

## 2010-08-24 ENCOUNTER — Encounter: Payer: Self-pay | Admitting: Internal Medicine

## 2010-08-26 NOTE — Progress Notes (Signed)
Quick Note:  Spoke with patient-aware of results and request we fax results to [facility] in [place] as pt has appt there Monday with his PCP. ______

## 2010-08-29 ENCOUNTER — Telehealth: Payer: Self-pay | Admitting: Internal Medicine

## 2010-08-29 NOTE — Telephone Encounter (Signed)
Spoke w/ Daughter and advised her report was already faxed by Florentina Addison. She states [facility] never received report. i advised her I will re fax report.

## 2010-09-13 NOTE — Discharge Summary (Signed)
Michael [name], Michael [name] NO.:  [id number]   MEDICAL RECORD NO.:  [id number]          PATIENT TYPE:  INP   LOCATION:  3712                         FACILITY:  [facility]   PHYSICIAN:  Michael Morton. Riley Kill, MD, FACCDATE OF BIRTH:  1931-10-28   DATE OF ADMISSION:  11/07/2006  DATE OF DISCHARGE:  11/09/2006                               DISCHARGE SUMMARY   PROCEDURE:  1. Cardiac catheterization.  2. Coronary arteriogram.  3. Left ventriculogram.   DISCHARGE DIAGNOSES:  1. Chest pain with medical therapy for coronary artery disease.  2. Status post percutaneous transluminal coronary angioplasty and      power failed balloon stent to the right coronary artery in 11/2001.  3. Status post drug eluting stent to the distal right coronary artery      in 01/2004 with subendocardial myocardial infarction secondary to      distal embolization.  4. Chronic obstructive pulmonary disease.  5. Hyperlipidemia.  6. Ongoing tobacco abuse.  7. Possible remote history of peptic ulcer disease.  8. History of thyroid toxicosis treated with iodine 131 and iatrogenic      hypothyroidism.  9. Status post right knee arthroscopy.  10.Cholecystectomy.  11.Osteoarthritis.  12.Shingles.  13.ALLERGY OR INTOLERANCE TO PENICILLIN, OXYCODONE, MORPHINE, CIPRO,      NEW IRON, LEVOFLOXACIN, GABAPENTIN.  14.Family history of coronary artery disease in three brothers.  15.Status post neck disk lesion.  16.Gallbladder removal.  17.Partial knee replacement.  18.Esophagogastroduodenoscopy and colonoscopy.  19.History of prostate cancer with biopsy and radiation treatment      January through March 2008.   TIME OF DISCHARGE:  47 minutes.   HOSPITAL COURSE:  Michael [name] is a 77 year old male with known coronary  artery disease.  He went to hospital for chest pain and was evaluated by  cardiology who felt that he should be referred to [facility] for further  evaluation of cardiac catheterization.   Mr.  [name] enzymes were negative for MI.  His TSH was within normal  limits to 0.938.  Total cholesterol 122, triglycerides 85, HDL 51, LDL  54.  Hemoglobin 10.4, hematocrit 31.9, which is consistent with previous  values.   Cardiac catheterization showed 40-50% LAD and 50% diffuse in-stent  restenosis in the proximal stent with the distal stent showing no in-  stent restenosis.  He has 1+ MR and his VF was normal.   Michael [name] was evaluated by cardiac rehab and will return to cardiac  rehab maintenance as an outpatient.  The next day, his groin was without  ecchymosis or hematoma.  He was ambulating without chest pain or  shortness of breath and discharged on November 09, 2006 with outpatient  followup arranged.  Of note, there is concern that anemia is  contributing to his symptoms.  He is being discharged home with stool  guaiac cards and is to followup with Dr. Clelia Croft.   DISCHARGE INSTRUCTIONS:  1. His activity level is to be increased gradually.  2. He is to call the office for any problems with his cath site.  3. He is to follow up with Dr. Andee Lineman  on July 15th and schedule and      follow up with Dr. Clelia Croft as well.   DISCHARGE MEDICATIONS:  1. Levothyroxine 125 mcg daily.  2. Plavix 75 mg daily.  3. Lisinopril 10 mg daily.  4. Protonix 40 mg daily.  5. Aspirin 81 mg daily.  6. Lipitor 40 mg daily.  7. Lyrica 50 mg q.h.s.  8. Multivitamin and over the counter iron tablets daily.  9. Ambien 10 mg or trazodone 15 mg q.h.s. p.r.n.  10.Nitroglycerin sublingual p.r.n.  11.Advair 250 b.i.d.  12.Combivent MDI p.r.n.  13.Lexapro 10 mg daily.  14.Flomax 0.4 mg daily.  15.Allegra 180 mg daily.      Michael Demark, PA-C      Michael Morton. Riley Kill, MD, [facility]  Electronically Signed    RB/MEDQ  D:  11/09/2006  T:  11/10/2006  Job:  [id number]   cc:   [facility]  Dr. Clelia Croft

## 2010-09-13 NOTE — Assessment & Plan Note (Signed)
OFFICE VISIT   MANNIX, KROEKER L  DOB:  [birth year]                                       02/04/2010  UJWJX#:[id number]   HISTORY OF PRESENT ILLNESS:  This is a 77 year old gentleman who is  being followed for bilateral carotid stenosis, who presents today with a  chief complaint of followup bilateral carotid duplexes.  This patient  previously had been diagnosed with bilateral internal carotid stenoses.  The right side was 60% to 79% and the left 40% to 59% on a study  June 16, 2009.  Has never had any symptoms or signs of transient  ischemic attacks or cerebrovascular accidents.  Never has had any  amaurosis fugax or monocular blindness, no episodes of facial drooping  or hemiplegia.  He never had any expressive or receptive aphasia.  His  risk factors for carotid disease include hyperlipidemia, hypertension.  Denies diabetes but he does have a smoking history.  For his risk factor  management he is taking Lipitor for his hyperlipidemia, aspirin for  antiplatelets.  Is not on any type of diabetic regimen.  For his  hypertension he has hydrochlorothiazide and Norvasc currently.  On  neurologic review of systems he denies any dizziness, headache vertigo,  dementia, seizures, Parkinson's or Alzheimer's disease, and no known  paresthesias or anesthesias.   PAST MEDICAL HISTORY:  1. Coronary artery disease.  2. Peripheral arterial disease.  3. Hypertension.  4. Hyperlipidemia.  5. COPD.  6. Tobacco abuse.  7. It is not clear if it is hypothyroidism or hyperthyroidism which he      required radioactive iodine.  8. Shingles.  9. Prostate cancer, for which he underwent radiation therapy.  10.Right eye vitreous hemorrhage.   PAST SURGICAL HISTORY:  Some type of Baker cyst procedure, a  cholecystectomy, cervical fusion, a left total knee replacement, right  partial knee replacement, cardiac catheterization on 3 different  occasions with stent placement,  bilateral cataract surgery, and then  steroid injections in the back.   SOCIAL HISTORY:  He is an active smoker with greater than 40 pack-year  history.  No alcohol or illicit drug use.  He is widowed with 5 children  and retired [employer].   FAMILY HISTORY:  Neither mother or father had any medical problems.  Died of old age.  He had 3 brothers, however, with coronary artery  disease.   REVIEW OF SYSTEMS:  He has joint pain and shortness of breath with  exertion.  Otherwise, the rest of his 12-point review of system was  noted to be negative.   MEDICATIONS:  1. Zantac  2. Norvasc.  3. Hydrochlorothiazide.  4. Celexa.  5. Synthroid.  6. Aspirin.  7. Ferrous sulfate.  8. Advair.  9. Lipitor.  10.Flomax.  11.Trazodone  12.Imdur.  13.Omega-3 fish oil.  14.Multivitamin.  15.Nitroglycerin.  16.Nasonex.  17.Spiriva.  18.Albuterol inhaler.  19.Guaifenesin.   ALLERGIES:  1. Penicillin, which gives him hives.  2. Oxycodone, which gives him hives.  3. Cipro gives him nausea.  4. Nu-Iron causes itching.  5. Norco gives him confusion, rash.  6. Lyrica.  7. Morphine.   PHYSICAL EXAMINATION:  Blood pressure is 133/69 with a heart rate of 80,  respirations were 12.  On general examination, alert and oriented x3, well-developed, well-  nourished, no apparent distress.  Head exam:  Normocephalic, atraumatic.  No temporalis wasting.  ENT examination:  Hearing is grossly intact.  Nares without erythema or  exudate.  Oropharynx without any erythema or exudate.  Eye exam:  Pupils are equal, round and react to light.  Extraocular  movements are intact.  Neck: Neck was supple with no nuchal rigidity.  There was no palpable  lymphadenopathy.  Pulmonary exam:  He has symmetric expansion, good air movement.  Clear  to auscultation bilaterally.  No rales, rhonchi or wheezing.  Cardiac exam:  Regular rate and rhythm.  Normal S1 and S2.  No murmurs,  rubs, thrills or gallops.  Back exam:   Easily palpable pulses throughout  all extremities.  Carotids did not have any bruits.  The aorta was not  palpable.  GI exam:  Soft, nontender, nondistended.  No guarding, no rebound, no  hepatosplenomegaly, no masses.  No costovertebral angle tenderness.  Musculoskeletal exam:  All extremities had 5/5 strength.  There were no  wounds on any extremities.  Neurologic exam:  Cranial nerves II-XII were intact.  The sensation to  all extremities was intact.  The motor exam was as noted above.  Psych exam:  Judgment was intact.  Mood and affect were appropriate for  his clinical situation.  Skin exam was as noted in the extremity exam.  Otherwise, I did not note  any rashes otherwise.  Lymphatic exam:  There was no obviously palpable lymphadenopathy in the  cervical, axillary or inguinal basins.   NONINVASIVE VASCULAR IMAGING:  I finalized the review of his bilateral  carotid duplex.  On the right side we have a peak systolic of 215/31  with an ICA to CC ratio of 3.6.  This would make on the right side  consistent with a 60%-79% stenosis.  On the left side he had a peak  systolic of 162 with diastolic of 32.  This would be consistent with a  40%-59% stenosis.  Compared to his previous studies, we see that this is  completely unchanged from his previous exam, which was unchanged from  the one previous to that.   MEDICAL DECISION-MAKING:  This is a 77 year old gentleman with  completely stable bilateral internal carotid artery disease that is  asymptomatic.  From ACAS data we note that the maximal benefit from  carotid endarterectomy is actually performed in those with greater than  80% stenosis.  This patient does not meet that criteria, in fact, his  velocities would put him on the lower end of the 60%-79%, so there is no  advantage in this patient who is on appropriate medical management to  proceeding froward with a carotid endarterectomy.  I stressed to him  that he needs to stop  smoking.  We discussed this for about 5 minutes.  He is aware of what the data shows.  He is going to discuss it with his  primary care doctor considering possibly using a combination of  counseling and pharmacotherapy to help with the smoking cessation.  At  this point that I would continue with yearly screening and if at any  point he develops TIA or stroke symptoms, clearly the data would  demonstrate that he needs a carotid endarterectomy but as he is  asymptomatic at this point there is no advantage, I believe, until he  exceeds the 80% threshold.   I appreciate being given the opportunity to participate in this  patient's care.  Thank you very much.  We will continue to on an annual  basis.  Leonides Sake, MD  Electronically Signed   BC/MEDQ  D:  02/04/2010  T:  02/07/2010  Job:  [id number]   cc:   [facility] Dr. Trena Platt, MD,FACC

## 2010-09-13 NOTE — Assessment & Plan Note (Signed)
[facility]                          [facility] NOTE   Michael [name], Michael [name]                      MRN:          [id number]  DATE:08/11/2008                            DOB:          Oct 08, 1931    PRIMARY CARE PHYSICIAN:  Dr. Kirstie Peri.   HISTORY OF PRESENT ILLNESS:  The patient is a 77 year old male with a  history of coronary artery disease.  The patient was recently admitted  to [facility] and ruled out for myocardial function.  A stress  test was within normal limits.  Last catheterization was in 2009 when he  had nonobstructive coronary artery disease.  He has had stent placed to  the RCA in 2005.  He also has moderate right carotid artery disease and  was referred to the Vascular surgeons.  The patient denies coronary  chest pressure, shortness of breath, orthopnea, PND, palpitations, or  syncope.  He is doing well.   MEDICATIONS:  1. Zantac 150 mg p.o. b.i.d.  2. Amlodipine 10 mg p.o. q.a.m.  3. Hydrochlorothiazide 25 mg q.a.m.  4. Celexa 20 mg q.a.m.  5. Levothyroxine 125 mcg p.o. q.a.m.  6. Aspirin 81 mg q.a.m.  7. Ferrous sulfate 325 q.p.m.  8. Advair 250/50 one puff b.i.d.  9. Lipitor 40 mg p.o. q.p.m.  10.Flomax 0.4 mg p.o. q.p.m.  11.Trazodone 100 mg p.o. nightly.  12.Isosorbide 30 mg q.a.m.  13.Omega-3 fish oil.  14.Multivitamin.   PHYSICAL EXAMINATION:  VITAL SIGNS:  Blood pressure 128/66, heart rate  63, and weight 160.  GENERAL:  A well-nourished white male in no apparent distress.  HEENT:  Pupils are equal.  Conjunctivae clear.  NECK:  Supple.  Normal carotid upstrokes.  The patient does have right  carotid bruit.  LUNGS:  Clear breath sounds bilaterally.  HEART:  Regular rate and rhythm.  Normal S1 and S2.  No murmurs or  gallops.  ABDOMEN:  Soft and nontender.  No rebound or guarding.  Good bowel  sounds.  EXTREMITIES:  No cyanosis, clubbing, or edema.   PROBLEM LIST:  1. Coronary artery  disease.      a.     Nonobstructive coronary artery disease with patent right       coronary artery stent in June 2009.      b.     Normal left ventricular ejection fraction.      c.     Status post multiple prior percutaneous coronary       intervention of the right coronary artery and Cypher stenting of       the right coronary artery in 2005.  2. Peripheral arterial disease.      a.     Moderate abdominal aortic plaque.      b.     50% right renal artery stenosis.  3. Right carotid bruit.  4. History of heme-positive anemia.  5. History of right eye vitreous hemorrhage in May 2009.  6. Chronic obstructive pulmonary disease, ongoing tobacco use.  7. Dyslipidemia.  8. Hypertension.  9. Hypothyroidism.   PLAN:  1. The patient is planning to  undergo knee surgery.  I do not think he      needs any further stress testing or cardiac catheterization, is      currently asymptomatic.  2. I have made no changes in medical regimen.  He will follow up with      Dr. Edilia Bo regarding his carotid artery disease.     Learta Codding, MD,FACC  Electronically Signed    GED/MedQ  DD: 08/11/2008  DT: 08/12/2008  Job #: [id number]   cc:   Kirstie Peri, MD

## 2010-09-13 NOTE — Cardiovascular Report (Signed)
NAMESHAARAV, RIPPLE NO.:  [id number]   MEDICAL RECORD NO.:  [id number]          PATIENT TYPE:  OIB   LOCATION:  1963                         FACILITY:  [facility]   PHYSICIAN:  Bevelyn Buckles. Bensimhon, MDDATE OF BIRTH:  07/02/1931   DATE OF PROCEDURE:  10/25/2007  DATE OF DISCHARGE:  10/25/2007                            CARDIAC CATHETERIZATION   PRIMARY CARE Erek Kowal:  Ms. Loman Chroman, nurse practitioner at Dr. Sandy Salaam office in [place].   CARDIOLOGIST:  Learta Codding, MD, [facility]   INDICATIONS:  [name] is a very pleasant 77 year old male with a  history of coronary artery disease status post several stents to the  right coronary artery in the past.  He also has COPD.  Over the past  month, he has had progressive chest pain and dyspnea as well as fatigue.  He was seen in Dr. Margarita Mail office and referred for outpatient  catheterization.   PROCEDURES PERFORMED:  1. Right selective coronary angiography.  2. Left heart cath.  3. Left ventriculogram.  4. Abdominal aortogram.   DESCRIPTION OF PROCEDURE:  The risks and indications of catheterization  were explained.  Consent was signed and placed on the chart.  The right  groin area was prepped and draped in routine sterile fashion, and  anesthetized with 4% local lidocaine.  Standard catheters including a JL-  4, 3-D RCA and angled pigtail were used for catheterization.  All  catheter exchanges were made over wire.  There were no apparent  complications.   HEMODYNAMICS:  Central aortic pressure was 97/47 with a mean of 67.  LV  pressure was 98/70 with an EDP of 11.  There was no aortic stenosis.   Coronary anatomy:  Left main:  Had distal 20% lesion.   LAD was a long vessel wrapping the apex.  It gave off a tiny first  diagonal and a large branching second diagonal.  In the ostium of the  first diagonal, there was a 50% stenosis.  In the mid-LAD just after the  second diagonal, there was tubular but smooth  40%-50% lesion.  This was  not flow-limiting.   Left circumflex was a moderate-sized vessel made up primarily of an OM-  1.  It is angiographically normal.   Right coronary artery was a large dominant vessel.  It gave off a PDA  and several posterolateral branches.  There was a large area of stenting  throughout the midportion of the RCA and also a focal area of stenting  distally prior to the PDA.  Throughout the stenting of the midportion,  there was approximately 30%-40% in-stent narrowing.  This area seemed  calcified.   Just before the distal stent, there was a 30% focal stenosis and just  mild narrowing of 20%-30% in the more distal stent.   Left ventriculogram done in the RAO position showed an EF of 50% with no  regional wall motion abnormalities.  No significant mitral  regurgitation.   Abdominal aortogram showed a moderate abdominal aortic plaquing.  There  was heavy calcification and no aneurysm.  The right renal artery was  difficult to see.  The ostium apparently appeared to have a 40%-50%  focal ostial lesion.  The left renal artery just had some mild tapering.   ASSESSMENT/PLAN:  1. Nonobstructive coronary artery disease as described above.  There      are patent right coronary artery stents with just minimal in-stent      narrowing.  2. Normal left ventricular ejection fraction with normal left      ventricular end-diastolic pressure.  3. Peripheral arterial disease as described above.   Mr. Rinker coronary anatomy appear stable and stents are patent.  We  would proceed with medical therapy at this time.      Bevelyn Buckles. Bensimhon, MD  Electronically Signed     DRB/MEDQ  D:  10/25/2007  T:  10/25/2007  Job:  [id number]   cc:   Learta Codding, MD,FACC

## 2010-09-13 NOTE — Assessment & Plan Note (Signed)
[facility]                          [facility] NOTE   Michael [name], Michael [name]                      MRN:          [id number]  DATE:11/13/2006                            DOB:          1931-12-24    [facility] NOTE   PRIMARY CARDIOLOGIST:  Dr. Lewayne Bunting.   REASON FOR VISIT:  Post-catheterization followup.   Michael [name] is a 77 year old male, with known coronary artery disease  status post multiple prior percutaneous interventions, who was recently  seen here in consultation at [facility] by Dr. Andee Lineman for  evaluation of chest pain.  His symptoms were worrisome for angina  pectoris, despite negative serial cardiac markers.  He was thus  transferred for cardiac catheterization to rule out CAD progression.  Of  note, his previous catheterization in May of 2007 suggested a 50-60% in-  stent restenosis of the RCA.   Subsequent cardiac catheterization by Dr. Shawnie Pons, however,  suggested continued patency of the RCA with approximately 50% diffuse  stent healing in the mid non-drug-eluting stent, as well as a widely  patent distal drug-eluting stent.  Residual anatomy suggested  nonobstructive disease and left ventricular function was preserved with  mild mitral regurgitation.   Dr. Riley Kill recommended continued medical management and also noted the  mild anemia with a hemoglobin of 11.3.   Since his release, the patient has not had any recurrent angina  pectoris.  He was discharged with Hemoccult cards, which he is still in  the midst of using.  He denies any overt evidence of bleeding, but does  cite dark stools secondary to supplemental iron.   Unfortunately, the patient continues to smoke but appears motivated to  stop doing so.   CURRENT MEDICATIONS:  1. Plavix.  2. Aspirin 81 daily.  3. Levoxyl 0.125 daily.  4. Lisinopril 10 daily.  5. Protonix.  6. Lipitor 40 daily.  7. Advair 250/50 b.i.d.  8.  Combivent p.r.n.  9. Lyrica 50 daily.  10.Flomax 0.4 daily.  11.Lexapro 10 daily.  12.Allegra.   PHYSICAL EXAM:  Blood pressure 147/70, pulse 57 and regular.  Weight  156.  GENERAL:  A 77 year old male sitting upright in no distress.  HEENT:  Normocephalic, atraumatic.  NECK:  Palpable bilateral carotid pulses without bruits.  LUNGS:  Clear to auscultation in all fields.  HEART:  Regular rate and rhythm (S1, S2).  No significant murmurs.  ABDOMEN:  Soft and nontender with intact bowel sounds.  EXTREMITIES:  Right groin is soft, nontender, with no hematoma; minimal  ecchymosis; faint bilateral bruits (left greater than right); palpable  distal pulses.  NEURO:  No focal deficits.   IMPRESSION:  1. Coronary artery disease.      a.     Status post recent cardiac catheterization revealing       continued patency of the right coronary artery stent sites.      b.     Normal left ventricular function.      c.     Status post multiple prior cardiac catheterizations and a       stenting  of the right coronary artery, most recently October 2005.  2. Chronic asymptomatic bradycardia.  3. Normocytic anemia.      a.     Previously evaluated by Dr. Cyndie Chime.  4. Hyperlipidemia.  5. Chronic obstructive pulmonary disease/ongoing tobacco.  6. Hypothyroidism.   PLAN:  1. Continue medical management.  2. Prescribe Chantix for aggressive smoking cessation.  3. The patient was instructed to keep scheduled followup with Dr. Sherryll Burger      in the next several weeks for further evaluation of recently noted      anemia.  In the meantime, we will check a followup CBC today.  4. Schedule return clinic followup with myself and Dr. Andee Lineman in      approximately 3 months.      Gene Serpe, PA-C  Electronically Signed      Learta Codding, MD,FACC  Electronically Signed   GS/MedQ  DD: 11/13/2006  DT: 11/13/2006  Job #: [id number]   cc:   Kirstie Peri, MD

## 2010-09-13 NOTE — Consult Note (Signed)
VASCULAR SURGERY CONSULTATION   CUFFEE, Bascom L  DOB:  06-07-1931                                       06/25/2008  ZOXWR#:[id number]   REFERRING PHYSICIAN:  Learta Codding, MD,FACC, [facility].   PRIMARY CARE PHYSICIAN:  Kirstie Peri, MD, [facility].   REFERRAL DIAGNOSIS:  Bilateral carotid artery stenosis.   HISTORY:  The patient is a 77 year old gentleman referred for evaluation  of abnormal carotid Dopplers.  He has no history of stroke.  Denies  visual, sensory or motor deficit.  No speech problems.  No gait  abnormality.  No syncope or presyncope.   Risk factors for carotid artery disease include coronary artery disease,  peripheral vascular disease, dyslipidemia, hypertension and tobacco use.   He underwent carotid Doppler evaluation at [facility] which  reveals a 50-69% proximal right internal carotid artery stenosis and a  less than 50% left internal carotid artery stenosis.  Calcified and  noncalcified plaque present at the bulb bilaterally.  Antegrade  vertebral flow bilaterally.   PAST MEDICAL HISTORY:  1. Coronary artery disease.  2. Peripheral vascular disease.  3. Hypertension.  4. Hyperlipidemia.  5. COPD.  6. Tobacco abuse.  7. Hypothyroidism.   MEDICATIONS:  1. Zantac 150 mg b.i.d.  2. Amlodipine 10 mg daily.  3. Hydrochlorothiazide 25 mg daily.  4. Celexa 20 mg daily.  5. Levothyroxine 125 mcg daily.  6. Aspirin 81 mg daily.  7. Ferrous sulfate 325 mg daily.  8. Advair Diskus 250/50 b.i.d.  9. Lipitor 40 mg daily.  10.Flomax 0.4 mg daily.  11.Trazodone 200 mg q.h.s.  12.Imdur 30 mg daily.  13.Lidoderm patch 12 hours daily.  14.Omega 3 fish oil 2 tablets b.i.d.  15.Multivitamin 1 tablet daily.  16.Nitroglycerin 0.4 mg p.r.n.   ALLERGIES:  Penicillin, oxycodone, Cipro and NuIron.   SOCIAL HISTORY:  The patient is widowed with five children.  He is a  retired Psychologist, occupational.  Smokes one pack of  cigarettes every 3 days.  No regular  alcohol use.   FAMILY HISTORY:  Mother deceased age 30 of complications of old age.  Father died age 66 in an accident.  Three brothers are deceased from  complications of coronary artery disease.   REVIEW OF SYSTEMS:  Refer to patient encounter form.  This was reviewed  today.  The patient notes shortness of breath with exertion.  He has  some depression feelings and a history of chronic anemia.   PHYSICAL EXAM:  General:  A 77 year old male appears approximately his  stated age.  Alert and oriented.  No acute distress.  Vital signs:  BP  133/70 both arms, pulse 80 per minute, respirations 16 per minute.  HEENT:  Mouth and throat are clear.  Normocephalic.  Extraocular  movements intact.  Neck:  Supple.  No thyromegaly or adenopathy.  Cardiovascular:  Right carotid bruit.  Normal heart sounds without  murmurs.  Regular rate and rhythm.  No gallops or rubs.  Chest:  Distant  breath sounds.  No rales or rhonchi.  Abdomen:  Soft, nontender.  No  masses or organomegaly.  Normal bowel sounds.  Extremities:  Full range  of motion.  No ankle edema.  Neurological:  Cranial nerves intact.  Strength equal bilaterally.  1+ reflexes.   IMPRESSION:  1. Moderate asymptomatic right internal carotid  artery stenosis.  2. Coronary artery disease.  3. Hypertension.  4. Hyperlipidemia.  5. Chronic obstructive pulmonary disease.  6. Tobacco abuse.   RECOMMENDATIONS:  The patient has asymptomatic moderate right internal  carotid artery stenosis.  No history of stroke.  Will plan followup in 6  months with carotid Doppler.  Continue current medications for chronic  medical conditions.  Counseled regarding smoking cessation.   Balinda Quails, M.D.  Electronically Signed  PGH/MEDQ  D:  06/25/2008  T:  06/26/2008  Job:  [id number]   cc:   Kirstie Peri, MD  Learta Codding, MD,FACC

## 2010-09-13 NOTE — Cardiovascular Report (Signed)
Michael [name], GRILLO NO.:  [id number]   MEDICAL RECORD NO.:  [id number]          PATIENT TYPE:  INP   LOCATION:  3712                         FACILITY:  [facility]   PHYSICIAN:  [name], MD, FACCDATE OF BIRTH:  Sep 23, 1931   DATE OF PROCEDURE:  DATE OF DISCHARGE:                            CARDIAC CATHETERIZATION   INDICATIONS:  The patient has had intermittent recurrent chest pain.  He  has had multiple prior stents in the right coronary artery.  The current  study was done to assess coronary anatomy.   PROCEDURES:  1. Left heart catheterization  2. Selective coronary arteriography.  3. Selective left ventriculography.   DESCRIPTION OF PROCEDURE:  The patient was brought to the  catheterization laboratory approximately 7-1/2 hours after the  administration of enoxaparin.  A right femoral puncture was utilized and  we used small 5-French catheters.  Views of the left and right  coronaries were then obtained in multiple angiographic projections.  Central aortic and left ventricular pressures were measured with pigtail  and ventriculography was performed in the RAO projection.  He tolerated  procedure without complication.  He was taken to the holding area in  satisfactory clinical condition.  I have reviewed the films with the  patient's daughter and also reviewed the films with the patient.   HEMODYNAMIC DATA:  1. Central aortic pressure 155/60, mean 97.  2. Left ventricular pressure 153/16.  3. No gradient on pullback across aortic valve.   ANGIOGRAPHIC DATA:  1. Ventriculography done in the RAO projection reveals preserved      global systolic function.  There is perhaps very mild mitral      regurgitation.  2. The right coronary artery is a previously-stented vessel that is      markedly tortuous in the proximal vessel.  There is a long area of      stenting in the mid artery, which represents a non drug-eluting      platform from 2003.  In the  area of the right coronary, there is      about 50% diffuse stent healing.  This does not represent high-      grade restenosis and it appears relatively smooth and stable.  The      distal vessel has a drug-eluting stent, it is widely patent without      significant renarrowing.  There is a large posterior descending and      posterolateral system, all with minimal luminal irregularity but no      critical disease.  3. The left main is free of critical disease.  4. The LAD courses to the apex after the major diagonal, and      overlapping the septal is an area of segmental plaquing of about      40%.  This appears to be largely unchanged from the previous study,      and the distal vessel wraps the apex.  There is a major diagonal      branch, which is free of critical disease.  The AV circumflex is      small and  without critical narrowing.   CONCLUSIONS:  1. Preserved left ventricular function with very mild mitral      regurgitation.  2. Continued patency of the right coronary artery with a non drug-      eluting stent mid vessel with moderate healing, and a widely patent      drug-eluting stent in the distal vessel.  3. Segmental plaquing of the mid left anterior descending artery      without critical stenosis.   DISPOSITION:  The patient has been under quite a bit of stress recently.  He has also been somewhat anemic.  We will arrange early follow-up with  Dr. Andee Lineman.  His MCV is 83 and his hemoglobin 11.3.      [name], MD, [facility]  Electronically Signed     TDS/MEDQ  D:  11/08/2006  T:  11/09/2006  Job:  [id number]

## 2010-09-13 NOTE — Assessment & Plan Note (Signed)
[facility]                          [facility] OFFICE NOTE   KENNETT, SYMES                      MRN:          [id number]  DATE:11/12/2007                            DOB:          Dec 15, 1931    CARDIOLOGIST:  Learta Codding, MD, [facility]   PRIMARY CARE Zuleika Gallus:  Orvilla Cornwall at Dr. Margaretmary Eddy office.   REASON FOR VISIT:  Postcatheterization followup.   HISTORY OF PRESENT ILLNESS:  Mr. Carew is a 77 year old male patient  with a history of CAD status post multiple percutaneous coronary  interventions to the RCA who recently presented for followup.  He  complained of chest pain, and we set him up for cardiac catheterization  to rule out progression of disease.  Cardiac catheterization was done on  October 25, 2007, by Dr. Gala Romney.  This revealed a distal left main  stenosis of 20%, ostial first diagonal stenosis of 50%, 40-50% mid LAD  stenosis, normal circumflex, and 30-40% in-stent restenosis in the RCA.  His EF was noted to be 50%.  He did have 40-50% ostial right renal  artery stenosis.  Continued medical therapy is warranted.  He returns  today for followup.   The patient notes his tightness resolved initially but then returned  recently with a cough and wheezing.  He has seen Ms. Hairfield in  followup and apparently received some sort of injection and is now on  antibiotic therapy for COPD exacerbation.  His shortness of breath with  exertion is essentially unchanged.  He denies orthopnea, PND, or pedal  edema.  He denies any syncope.   Of note, during his pre-catheterization workup, his labs revealed a  hemoglobin of 11.6 and MCV of 94.4.  He had previously followed with Dr.  Cyndie Chime for anemia.  The patient tells me he did see Dr. Cleotis Nipper,  and it was not felt that he needed relook colonoscopy.  He did have  stool cards, but these have not been completed yet.  Some other type of  radiographic study was ordered by Dr.  Cleotis Nipper, and followup is planned  in several weeks.   Also, pre-catheterization labs revealed a potassium of 5.3 and  lisinopril was held.  Followup blood work revealed a potassium of 4.6.  The patient is now back on his lisinopril.   The patient needs an upcoming dental procedure.  He needs to hold his  aspirin and Plavix.  He is inquiring whether or not he can come off his  aspirin and Plavix.   CURRENT MEDICATIONS:  1. Ranitidine 150 mg daily.  2. Allegra 180 mg daily.  3. Levoxyl 0.125 mg daily.  4. Plavix 75 mg daily.  5. Lisinopril 10 mg daily.  6. Aspirin 81 mg daily.  7. Lipitor 40 mg daily.  8. Multivitamin daily.  9. Trazodone p.r.n.  10.Advair 250/50 b.i.d.  11.Flomax 0.4 mg daily.   PHYSICAL EXAMINATION:  GENERAL:  He is well nourished, well developed,  in no distress.  VITAL SIGNS:  Blood pressure is 129/76, pulse 84, and weight 159.2  pounds.  HEENT:  Normal.  NECK:  Without  JVD.  CARDIAC:  S1 and S2.  Regular rate and rhythm.  LUNGS:  Clear to auscultation bilaterally.  ABDOMEN:  Soft and nontender.  EXTREMITIES:  Without edema.  VASCULAR:  Right femoral arteriotomy site without hematoma, but positive  bruit is noted.   IMPRESSION:  1. Coronary artery disease.      a.     Recent episode of chest discomfort - likely noncardiac.      b.     Status post multiple percutaneous coronary interventions to       the right coronary artery (Cypher stenting to the right coronary       artery in 2005).      c.     Recent cardiac catheterization, with patent right coronary       artery stent and mild to moderate nonobstructive disease - medical       therapy.  2. Good left ventricular function with an ejection fraction of 50% per      recent cardiac catheterization.  3. Peripheral arterial disease.      a.     Ostial 40% to 50% right renal artery stenosis per recent       cardiac catheterization.  4. Normocytic anemia.      a.     Previously followed by Dr.  Cyndie Chime.  5. Hyperlipidemia.  6. Chronic obstructive pulmonary disease with recent exacerbation.  7. Hypothyroidism.  8. Gastroesophageal reflux disease.  9. History of vitreous hemorrhage.  10.Right groin bruit status post catheterization.   PLAN:  1. Mr. Wessinger returns for postcatheterization followup.  Overall, he      is doing well.  I think most of his symptoms are probably related      to his pulmonary status rather than his cardiac status.  His      coronary anatomy looked to be stable.  He did not have any      improvement in symptoms with the nitrate therapy.  He is currently      being treated with antibiotic therapy for COPD exacerbation.  2. The patient had anemia noted recently on pre-catheterization labs.      He is being followed closely by Ms. Romeo Apple for this.  He is in      need of blood work today as outlined below, and we will get a      repeat CBC with that blood work and pass along to Ms. Romeo Apple.  Of      note, he has followup pending with Dr. Cleotis Nipper in the near      future.  3. The patient had recent hyperkalemia on lisinopril and does have      moderate nonobstructive renal artery stenosis on the right.  He is      back on lisinopril.  We will recheck a BMET today.  If his      potassium goes up again, then he will need his ACE inhibitor      discontinued.  4. He has a right groin bruit status post catheterization.  He will      have a right groin ultrasound to rule out pseudoaneurysm.  5. The patient would like a referral back to cardiac rehabilitation      and we will facilitate that.  6. The patient will follow up with Dr. Andee Lineman in the next 3 months or      sooner p.r.n.      Tereso Newcomer, PA-C  Electronically Signed  Jonelle Sidle, MD  Electronically Signed   SW/MedQ  DD: 11/12/2007  DT: 11/13/2007  Job #: [id number]   cc:   Orvilla Cornwall, NP

## 2010-09-13 NOTE — Procedures (Signed)
CAROTID DUPLEX EXAM   INDICATION:  Carotid disease.   HISTORY:  Diabetes:  No.  Cardiac:  Yes.  Hypertension:  Yes.  Smoking:  Yes.  Previous Surgery:  No.  CV History:  Currently asymptomatic.  Amaurosis Fugax No, Paresthesias No, Hemiparesis No                                       RIGHT             LEFT  Brachial systolic pressure:         126               126  Brachial Doppler waveforms:         Normal            Normal  Vertebral direction of flow:        Antegrade         Antegrade  DUPLEX VELOCITIES (cm/sec)  CCA peak systolic                   88                73  ECA peak systolic                   190               138  ICA peak systolic                   242               188  ICA end diastolic                   29                39  PLAQUE MORPHOLOGY:                  Mixed / calcific  Mixed / calcific  PLAQUE AMOUNT:                      Moderate / severe Moderate  PLAQUE LOCATION:                    ICA / ECA         ICA / ECA / CCA   IMPRESSION:  1. Low end 60%-79% stenosis of the right internal carotid artery.  2. 40%-59% stenosis of the left internal carotid artery.  3. No significant change noted when compared to the previous exam on      12/17/2008.   ___________________________________________  Quita Skye. Hart Rochester, M.D.   CH/MEDQ  D:  06/17/2009  T:  06/17/2009  Job:  [id number]

## 2010-09-13 NOTE — Procedures (Signed)
CAROTID DUPLEX EXAM   INDICATION:  Evaluation of carotid artery disease.   HISTORY:  Diabetes:  No.  Cardiac:  Yes.  Hypertension:  Yes.  Smoking:  Yes.  Previous Surgery:  No.  CV History:  No.  Amaurosis Fugax No, Paresthesias No, Hemiparesis No                                       RIGHT             LEFT  Brachial systolic pressure:         120               120  Brachial Doppler waveforms:         Biphasic          Biphasic  Vertebral direction of flow:        Antegrade         Antegrade  DUPLEX VELOCITIES (cm/sec)  CCA peak systolic                   115               99  ECA peak systolic                   253               217  ICA peak systolic                   288               211  ICA end diastolic                   55                47  PLAQUE MORPHOLOGY:                  Calcified         Calcified  PLAQUE AMOUNT:                      Moderate          Moderate  PLAQUE LOCATION:                    Bif/ICA/ECA       Bif/ICA/ECA   IMPRESSION:  1. 60-79% right internal carotid artery stenosis.  2. High end 40-59% left internal carotid artery stenosis.  3. Bilateral external carotid artery stenosis.         ___________________________________________  Di Kindle. Edilia Bo, M.D.   AC/MEDQ  D:  12/17/2008  T:  12/17/2008  Job:  [id number]

## 2010-09-13 NOTE — Assessment & Plan Note (Signed)
[name]                          [facility]ICE NOTE   Michael, [name]                      MRN:          [id number]  DATE:10/21/2007                            DOB:          09/26/1931    CARDIOLOGIST:  Michael Codding, MD, [name]   PRIMARY CARE Michael [name]:  Ms. Michael Chroman, NP at [name] office.   REASON FOR VISIT:  Annual followup and chest pain.   HISTORY OF PRESENT ILLNESS:  Michael [name] is a very pleasant 77 year old  male patient with a history of CAD status post multiple cardiac  catheterizations and PCI's, mainly to the RCA in the past.  He last  underwent cardiac catheterization in July 2008.  At that time, he had  preserved LV function and patent stents in the RCA and nonobstructive  disease in the LAD.  He was treated medically.  He was apparently  somewhat anemic at that time.  We have not seen him since followup in  July 2008.   In the office today, the patient notes recurrence of chest discomfort  over the last 3-4 weeks.  He also notes significant fatigue as well as  dyspnea with exertion.  He has seen Michael [name], Nurse Practitioner  with [name] recently, who performed blood work.  His labs from Sep 03, 2007 revealed a creatinine of 1.2, normal LFTs, sodium 134, potassium  4.9, iron 43, TSH 1.27, hemoglobin 13.3, and platelet count 358,000.  He  is apparently being set up with [name] for a colonoscopy.  Recently, he developed a vitreous hemorrhage and is under the care of  [name] at [name].  Apparently, his vitreous  hemorrhage is improving, and there are no plans for surgery at this  point in time.  He is to remain on aspirin and Plavix therapy.   The patient's chest discomfort is fairly constant and it is left-sided.  He describes it as a tightness.  He notes some tingling in his left arm  as well.  He notes dyspnea with exertion with just about any activity.  He describes  NYHA Class II-IIB symptoms.  He denies orthopnea or PND.  Denies any pedal edema.  Denies any syncope or near syncope.  He notes  that his chest discomfort and shortness of breath are quite similar to  what he has had in the past with his anginal symptoms that required  stenting.  He thinks that his shortness of breath with exertion at this  point in time is worse than it has been in the past.  He does note that  his chest pain was constant prior to his previous stenting.   CURRENT MEDICATIONS:  1. Levoxyl 125 mcg daily.  2. Plavix 75 mg daily.  3. Lisinopril 10 mg daily.  4. Aspirin 81 mg daily.  5. Lipitor 40 mg daily.  6. Multivitamin daily.  7. Trazodone p.r.n.  8. Advair 250/50 b.i.d.  9. Lexapro 10 mg daily.  10.Omeprazole 20 mg daily.  11.Flomax 0.4 mg daily.  12.Nitroglycerin p.r.n.  13.Allegra p.r.n.   ALLERGIES:  PENICILLIN  causes hives, OXYCODONE, CIPRO, and NEW IRON.   SOCIAL HISTORY:  The patient continues to smoke cigarettes.   REVIEW OF SYSTEMS:  Please see HPI.  Denies any fevers, chills, melena,  hematochezia, hematuria, or dysuria.  He does note a cough that is  productive of clear sputum.  He denies any dysphagia or odynophagia.  Denies any significant GERD symptoms.  Rest of the review of systems are  negative.   PHYSICAL EXAMINATION:  GENERAL:  He is a well-nourished and well-  developed man in no distress.  VITAL SIGNS:  Blood pressure is 131/71, pulse 67, weight 159.8 pounds,  and oxygen saturation 97% on room air.  HEENT:  Normal.  NECK:  Without JVD.  No lymphadenopathy.  No thyromegaly.  CARDIAC:  Normal S1 and S2.  Regular rate and rhythm.  LUNGS:  Decreased breath sounds bilaterally.  Mild scattered expiratory  wheezes.  ABDOMEN:  Soft and nontender with normoactive bowel sounds.  No  organomegaly.  EXTREMITIES:  Without clubbing, cyanosis, or edema.  MUSCULOSKELETAL:  Without joint deformity.  NEUROLOGIC:  He is alert and oriented x3.   Cranial II-XII are grossly  intact.  VASCULAR:  I cannot appreciate any carotid artery bruits bilaterally.  Femoral pulses are 2+ bilaterally without bruits.   Electrocardiogram in the office today reveals sinus rhythm with a heart  rate of 67, normal axis, and no acute changes.   IMPRESSION:  1. Chest pain with shortness of breath.  2. Coronary artery disease.      a.     Status post multiple prior cardiac catheterizations and       multiple episodes of stenting to the right carotid artery.      b.     Cardiac catheterization in July 2008 with patent RCA stent       with 50% in-stent restenosis in the mid RCA and nonobstructive       disease in the left anterior descending.  3. Preserved left ventricular function.  4. Peripheral arterial disease.      a.     Distal aortogram performed by Michael [name], October 2007:  30%       proximal SMA stenosis, 50% ostial celiac artery stenosis - lesions       were not felt to be severe enough to cause mesenteric ischemia at       that time.  5. History of normocytic anemia, previously evaluated by Dr.      Cyndie [name].      a.     Recent normal hemoglobin and hematocrit by labs done in May       2009.  6. Hyperlipidemia.  7. Chronic obstructive pulmonary disease with ongoing tobacco abuse.  8. Treated hypothyroidism.  9. Gastroesophageal reflux disease.  10.Recent history of vitreous hemorrhage.   PLAN:  1. Michael [name] returns to the office today for followup.  He has had      symptoms of chest discomfort and shortness of breath over the last      several weeks.  His chest pain is somewhat atypical for ischemia in      that he has had constant chest discomfort over the last 3-4 weeks.      However, his dyspnea with exertion is reminiscent of his symptoms      prior to his RCA stenting in the past.  I discussed this with Dr.      Andee [name], who also saw the patient today.  At this point in time,  we      recommend proceeding with cardiac  catheterization to further assess      his symptoms.  Risks and benefits have been explained.  The patient      agrees to proceed.  We will plan on outpatient cardiac      catheterization later this week.  2. I have initiated Imdur 30 mg a day for symptom management.  I      elected not to start him on a beta-blocker secondary to his ongoing      wheezing and COPD.  3. He will continue on aspirin and Plavix and use p.r.n.      nitroglycerin.  4. A chest x-ray will be obtained with his pre-catheterization workup.  5. The patient's omeprazole will be discontinued and he will be placed      on ranitidine 150 mg b.i.d. given the recent evidence of proton      pump inhibitors interfering with Plavix therapy.  6. The patient will return for followup post catheterization.      Tereso Newcomer, PA-C  Electronically Signed      Michael Codding, MD,FACC  Electronically Signed   SW/MedQ  DD: 10/21/2007  DT: 10/22/2007  Job #: [id number]   cc:   Kirstie Peri, MD  Randall An, MD

## 2010-09-13 NOTE — Assessment & Plan Note (Signed)
OFFICE VISIT   Michael [name], Michael [name]  DOB:  [birth year]                                       12/17/2008  [id number]#:[id number]   The patient is a 77 year old gentleman initially seen in consultation in  February of this year with bilateral carotid stenosis.  He was  asymptomatic at that time.  Severity was moderate on the left and  moderate to severe on the right by Doppler.   He returns at this time for elective followup visit.  He has remained  asymptomatic.  He notes some mild blurring of vision in his eyes.  No  amaurosis fugax.  No sensory motor or visual deficit.   His carotid Doppler reveals a 60-79% right ICA stenosis, 40-59% left ICA  stenosis.   On evaluation the patient appears well.  Alert and oriented.  BP is  128/61 in both arms, pulse is 60 per minute.  He has no carotid bruits.  Cranial nerves intact.  Strength equal bilaterally.  1+ reflexes.   The patient continues to do well without neurologic symptoms.  We will  plan followup again in 6 months with repeat carotid Doppler and office  visit.   Balinda Quails, M.D.  Electronically Signed   PGH/MEDQ  D:  12/17/2008  T:  12/18/2008  Job:  [id number]   cc:   Learta Codding, MD,FACC  Margot Ables, MD

## 2010-09-13 NOTE — Procedures (Signed)
CAROTID DUPLEX EXAM   INDICATION:  Carotid disease.   HISTORY:  Diabetes:  No.  Cardiac:  Yes.  Hypertension:  Yes.  Smoking:  Yes.  Previous Surgery:  No.  CV History:  Currently asymptomatic.  Amaurosis Fugax No, Paresthesias No, Hemiparesis No.                                       RIGHT             LEFT  Brachial systolic pressure:         120               122  Brachial Doppler waveforms:         Normal            Normal  Vertebral direction of flow:        Antegrade         Antegrade  DUPLEX VELOCITIES (cm/sec)  CCA peak systolic                   60                89  ECA peak systolic                   154               144  ICA peak systolic                   215               162  ICA end diastolic                   31                32  PLAQUE MORPHOLOGY:                  Mixed/calcific    Mixed/calcific  PLAQUE AMOUNT:                      Moderate/severe   Moderate  PLAQUE LOCATION:                    ICA/ECA           ICA/ECA/CCA   IMPRESSION:  1. Doppler velocities suggest a high-end 40% to 59% stenosis of the      right proximal internal carotid artery and a 40% to 59% stenosis of      the left proximal internal carotid artery; however, the velocities      may be underestimated due to calcific  plaque shadowing.  2. No significant change in Doppler velocities when compared to a      previous examination on 06/16/2009.   ___________________________________________  Leonides Sake, MD   CH/MEDQ  D:  02/04/2010  T:  02/04/2010  Job:  [id number]

## 2010-09-13 NOTE — Assessment & Plan Note (Signed)
[facility]                          [facility] OFF[facility] NOTE   RENE, Michael [name]                      MRN:          [id number]  DATE:05/07/2008                            DOB:          02/20/1932    REASON FOR VISIT:  Scheduled followup.   Mr. Baggerly reports no interim development of exertional angina pectoris,  since his last office visit here in July.  At that time, he was referred  for right groin ultrasound for further evaluation of a noted bruit, and  this was negative for pseudoaneurysm.  He was also noted to have mild  hyperkalemia on an ACE inhibitor, and I subsequently stopped this  secondary to potassium level of 5.4, with no supplemental potassium on  board.  Given his history of normal left ventricular function, and mild  renal artery stenosis, I felt that we could discontinue treatment with  an ACE inhibitor for his hypertension.  He has since been placed on  amlodipine 5 mg daily, with noted improvement in his blood pressure.   Mr. Littler has also since undergone extensive evaluation for previously  documented heme-positive anemia.  He reports having had a negative  colonoscopy, and we also have the results of a small bowel/upper GI  series, which was within normal limits.   Mr. Franchini has also since followed with Dr. Jetty Duhamel in [place],  for his history of COPD, and follows with him annually.   CURRENT MEDICATIONS:  1. Plavix.  2. Aspirin 81 daily.  3. Lipitor 40 daily.  4. Amlodipine 5 daily.  5. Ranitidine.  6. Celexa.  7. Levothyroxine 0.125 daily.  8. Trazodone.  9. Fergon.  10.Flomax.  11.Advair.   PHYSICAL EXAMINATION:  VITAL SIGNS:  Blood pressure 148/74, pulse 80 and  regular, and weight 161.  GENERAL:  A 77 year old male, sitting upright, in no distress.  HEENT:  Normocephalic, atraumatic.  NECK:  Palpable bilateral carotid pulses with soft right carotid bruit.  LUNGS:  Clear to auscultation in  all fields.  HEART:  Regular rate and rhythm.  No significant murmurs.  ABDOMEN:  Soft, intact bowel sounds.  EXTREMITIES:  Minimally palpable peripheral pulses with no significant  edema.  NEUROLOGIC:  Flat affect, but no focal deficit.   IMPRESSION:  1. Coronary artery disease.      a.     Nonobstructive CAD with patent RCA stent, June 2009.      b.     Normal LVF.      c.     Status post multiple prior PCIs of RCA; Cypher stenting of       RCA in 2005.  2. Peripheral arterial disease.      a.     Moderate abdominal aortic plaquing.      b.     A 50% ostial right renal artery stenosis.  3. Right carotid bruit.  4. History of heme-positive anemia.  5. History of right eye vitreous hemorrhage, May 2009.  6. COPD/ongoing tobacco.  7. Dyslipidemia.  8. Hypertension.  9. Hypothyroidism.   PLAN:  1. Discontinue Plavix.  The patient  is now several years out since      undergoing drug-eluting stenting and most recent catheterization in      last June showed continued patency of the RCA stent sites.      Moreover, given his history of heme-positive anemia, as well as      vitreous hemorrhage, I feel that he no longer needs to be on      bimodal therapy with aspirin and Plavix.  Therefore, he is to      remain on low-dose aspirin, indefinitely.  2. Add omega-3 fish oil to his lipid-lowering regimen.  Most recent      profile notable for an LDL of 80, this past September.  I prefer to      add fish oil, rather than increase his Lipitor to full dose, at      this point in time.  3. I will schedule carotid Dopplers to exclude significant right ICA      disease.  4. Schedule return clinic followup with myself and Dr. Andee Lineman in 1      year, or sooner as needed.      Gene Serpe, PA-C  Electronically Signed      Learta Codding, MD,FACC  Electronically Signed   GS/MedQ  DD: 05/07/2008  DT: 05/08/2008  Job #: [id number]   cc:   Kirstie Peri, MD

## 2010-09-16 NOTE — Discharge Summary (Signed)
Michael [name], MCCORKEL                           ACCOUNT NO.:  [id number]   MEDICAL RECORD NO.:  [id number]                   PATIENT TYPE:  INP   LOCATION:  6524                                 FACILITY:  [facility]   PHYSICIAN:  Annett Fabian, PA [facility]              DATE OF BIRTH:  [birth year]   DATE OF ADMISSION:  12/17/2001  DATE OF DISCHARGE:  12/19/2001                                 DISCHARGE SUMMARY   DISCHARGE DIAGNOSES:  1. Chest pain, status post cardiac catheterization.  2. Coronary artery disease, status post stent to the right coronary artery     on 12/03/01.  3. Chronic obstructive pulmonary disease.  4. Hypothyroidism.  5. Arthritis.  6. History of shingles.  7. Hyperlipidemia.   HOSPITAL COURSE:  The patient is a 77 year old male who had a stent placed  on 12/03/01 to the right coronary artery.  Of note, there was residual disease  of 70 to 75% in the left anterior descending artery.  On the day of  admission, the patient initially presented to [facility] ER with  chest pain.  He was subsequently transferred to [facility] for  further evaluation.  The patient was seen and admitted by Dr. Graceann Congress.  Given the similarity of the patient's symptoms to his prior  angina, Dr. Corinda Gubler felt that the patient should undergo coronary  angiography the next day.   The next day the patient was taken to the catheterization laboratory by Dr.  Revonda Humphrey.  Dr. Joycelyn Rua noted that the previously placed stent in the  right coronary artery was widely patent.  The residual disease in the left  anterior descending was felt to be hemodynamically insignificant by pressure  wire criteria.  He felt that the patient's chest pain was likely non-cardiac  in origin.   The next day, the patient was seen by Dr. Rollene Rotunda.  Dr. Antoine Poche felt  the patient's groin was stable.  He also noted the patient was mildly  anemic, and recommended followup by the patient's primary  care physician.  He also noted the patient's TSH was markedly elevated at 8.025, and he felt  that followup should be with the patient's primary care physician.  Otherwise, the patient was felt to be ready for discharge.   DISCHARGE MEDICATIONS:  1. Combivent 2 puffs b.i.d.  2. Zoloft 60 mg q.d.  3. Plavix 75 mg q.d.  4. Enteric coated aspirin 325 mg q.d.  5. Synthroid 100 mcg q.d.  6. Pravachol 40 mg q.h.s.  7. Advair discus 100/50 one puff b.i.d.  8. Trazodone 50 to 100 mg at bedtime p.r.n.  9. Sublingual nitroglycerin p.r.n.   LABORATORY DATA:  White blood cell count 10.1, hemoglobin 11.3, hematocrit  33.2, platelets 307.  TSH 8.025.  Sodium 136, potassium 4.4, chloride 105,  CO2 27, BUN 12, creatinine 1.2, glucose 97.   ACTIVITY:  No driving,  heavy lifting or tub baths for two days.   DIET:  He is to follow a low fat, low cholesterol diet.   WOUND CARE:  He is to watch the catheterization site for any pain, bleeding,  or swelling, and call the [place] office with any of these problems.   FOLLOWUP:  1. Dr. Eliberto Ivory p.r.n. or as scheduled.  2. Dr. Diona Browner as previously scheduled.                                               Annett Fabian, PA [facility]    CKM/MEDQ  D:  12/19/2001  T:  12/22/2001  Job:  [id number]   cc:   Haig Prophet

## 2010-09-16 NOTE — H&P (Signed)
NAMETRAVONTE, BYARD NO.:  [id number]   MEDICAL RECORD NO.:  [id number]          PATIENT TYPE:  EMS   LOCATION:  MAJO                         FACILITY:  [facility]   PHYSICIAN:  Willa Rough, M.D.     DATE OF BIRTH:  Feb 24, 1932   DATE OF ADMISSION:  10/12/2004  DATE OF DISCHARGE:                                HISTORY & PHYSICAL   HISTORY OF THE PRESENT ILLNESS:  Michael [name] is 77 years old.  He lives in  the [place] area.  He is followed by Dr. Diona Browner in the [place] office.  The  patient has known coronary disease.  His last intervention was done on  February 17, 2005.  The patient had a distal embolus at that time with some  enzyme elevation.  Because of that and because of some recurring pain he had  a relook cath on February 28, 2005.  The site looked good.  There was  residual 70% LAD disease. He did have a follow up Cardiolite scan done at  [facility] in January.  The family reports that this was okay.  I  have office notes from our [place] office as late as 2004, but there are none  available to me from 2005 or 2006.   During the past month or two the patient has had some type of  pulmonary  illness.  He was treated by Dr. Eliberto Ivory with meds including a Z-Pak and  tapering steroids.  I think that the dose of steroids was 60, 60, 40, 40,  followed by 30 mg once a day for one week and 20 mg once a day for one week.  Currently he is in the middle of the one-week of 30 mg of prednisone.   Today the patient had pain in his left anterior chest that was more similar  to his original to his original cardiac pain.  He became quite anxious, was  assessed and is brought to [facility].  He did receive nitroglycerin and  feels better.  He has no acute EKG changes.  Enzymes so far are negative.  He is stable.   ALLERGIES:  There is a PENICILLIN allergy.   MEDICATIONS:  1.  Advair 1 b.i.d.  2.  Combivent inhaler 2 puffs q.i.d.  3.  Trazodone 50 mg q.h.s. p.r.n.  4.   Synthroid 0.125 mg daily.  5.  Plavix 75 mg daily.  6.  Aspirin 325 mg daily.  7.  Protonix 40 mg daily.  8.  Vitamin.  9.  Lisinopril 10 mg p.o. daily.   PAST MEDICAL HISTORY:  For other medical problems please see the complete  list below.   SOCIAL HISTORY:  The patient has smoked over time.  He lives with his wife  in [place] and he is retired.   FAMILY HISTORY:  The family history is positive for coronary disease.   REVIEW OF SYSTEMS:  As mentioned the patient has had some type of  respiratory illness.  He does have a COPD history.  He had decreased  appetite for a while, but this has improved in  the last few days.  He is not  having any GU symptoms.  He has no major musculoskeletal symptoms.  Otherwise his review of systems is negative.   PHYSICAL EXAMINATION:  VITAL SIGNS:  Blood pressure is 120/60 with a pulse  of 80.  Respirations are 18 and not labored.  Temperature is 98.2.  GENERAL APPEARANCE:  The patient is oriented to person, time and place.  His  affect is normal.  LUNGS:  The lungs are clear.  Respiratory effort is not labored.  HEENT:  The HEENT reveals no xanthelasma.  He has normal extraocular motion.  NECK:  There are no carotid bruits.  Thee is no jugular venous distention.  CHEST:  The chest exam is stable.  HEART:  The cardiac exam reveals an S1 with an S2.  There are no clicks or  significant murmurs.  ABDOMEN:  The patient's abdomen is soft.  There is no organomegaly.  No  bruits are heard.  There is no liver tenderness.  VASCULAR:  The distal pulses reveal 2+ pulses in both feet.  He has no  significant peripheral edema.  There are no major musculoskeletal  deformities.   LABORATORY DATA:  The EKG shows not acute abnormality.  His creatinine is  1.2.  Hemoglobin is 11.   PROBLEM LIST:  Problems include:  1.  Coronary artery disease with unstable angina at this time.  We known      that he had stents in the past, the last of which placed in October       2005.  He has a residual 70% left anterior descending lesion.   Because of his presentation with pain today, the prior history and the  residual to 70% lesion I feel that the most appropriate approach will be  catheterization; and, this will be arranged for tomorrow.  The patient and  his wife are in agreement.   1.  History of hyperthyroidism followed by ablation, followed by replacement      therapy.   The thyroid stimulating hormone will be checked.   1.  Lipid abnormalities.  2.  Chronic obstructive pulmonary disease.  3.  Cigarette history.  4.  Anxiety.  5.  Insomnia.  6.  Status post cholecystectomy.  7.  Gastroesophageal reflux disease history.  8.  PENICILLIN allergy.  9.  Pulmonary illness over several weeks with a tapering steroid dose and      the patient is still on 30 milligrams of steroids daily.   PLAN:  The patient is admitted with steroids to be continued.  Aspirin is  being used.  The patient is already on Plavix.  Heparin will be started,  enzymes will be checked and the patient will undergo catheterization  tomorrow.       JK/MEDQ  D:  10/12/2004  T:  10/12/2004  Job:  [id number]   cc:   Dr. Edilia Bo , [facility]  [address]  [place], [place] Robbie Lis   Jonelle Sidle, M.D. [facility]

## 2010-09-16 NOTE — Procedures (Signed)
[facility]  Patient:    Michael [name], Michael [name]                        MRN: [id number] Proc. Date: 09/11/00 Adm. Date:  [id number] Attending:  Thyra Breed CC:         Weyman Pedro, M.D.   Procedure Report  PROCEDURE:  Left occipital nerve block.  DIAGNOSIS:  Occipital headaches with underlying cervical spondylosis.  INTERVAL HISTORY:  The patient has noted that the right side of his head is essentially pain free.  He now has some mild pain at level of ______ over the left side, predominantly emanating from the base of the neck and the front of the trigger point and at the left lesser occipital groove.  His medications are unchanged.  PHYSICAL EXAMINATION:  Blood pressure 129/61, heart rate 63, respiratory rate 18, O2 saturations 96%.  Pain level is 4/10.  The patient demonstrates moderate restriction of range of motion of the neck with tenderness over the left lesser occipital groove and a trigger point in the body of the trapezius. Both of these areas are marked.  Deep tendon reflexes are symmetric.  DESCRIPTION OF PROCEDURE:  After informed consent was obtained, the patient was placed in the sitting position and monitored.  The areas marked were prepped with Betadine x 3, and I injected 2 cc of local anesthetic at each site.  The local anesthetic consisted of a mixture of 1% lidocaine and 0.5% levobupivacaine in a 1:1 ratio.  The needle was removed intact.  Postprocedure condition - stable.  DISCHARGE INSTRUCTIONS: 1. Resume previous diet. 2. Limitations on activities per instruction sheet. 3. Continue on current medications. 4. I advised the patient that we could schedule him back in six months for a    repeat series if he needs it or if he still has some residual pain on the    left side, he could let us know, and we would see him back sooner. DD:  09/11/00 TD:  09/11/00 Job: [id number]

## 2010-09-16 NOTE — Cardiovascular Report (Signed)
Michael [name], ARORA NO.:  [id number]   MEDICAL RECORD NO.:  [id number]          PATIENT TYPE:  INP   LOCATION:  6524                         FACILITY:  [facility]   PHYSICIAN:  Carole Binning, M.D. LHCDATE OF BIRTH:  06-07-1931   DATE OF PROCEDURE:  10/13/2004  DATE OF DISCHARGE:                              CARDIAC CATHETERIZATION   PROCEDURE PERFORMED:  Left heart catheterization with coronary angiography  and left ventriculography.   INDICATIONS:  The patient is a 77 year old male with a history of coronary  artery disease and prior stents to the right coronary artery.  He was  admitted to hospital with chest pain and referred for cardiac  catheterization.   PROCEDURAL NOTE:  A 6-French sheath was placed in the right femoral artery.  Coronary angiography was performed standard Judkins 6-French catheters.  Left ventriculography was performed with an angled pigtail catheter.  Contrast was Omnipaque.  There were no complications.   RESULTS:   HEMODYNAMICS:  Left ventricular pressure 145/16.  Aortic pressure 140/56.  There is no aortic valve gradient.   LEFT VENTRICULOGRAM:  Wall motion is normal.  Ejection fraction estimated at  65%.  There is no mitral regurgitation.   CORONARY ARTERIOGRAPHY:  Left main is normal.   Left anterior descending artery has a tubular 60-70% stenosis in the mid  vessel.  This is unchanged from previous catheterization.  The LAD gives  rise to a small first diagonal branch and a large second diagonal branch.   Left circumflex gives rise to single normal sized obtuse marginal branch.  The left circumflex is normal.   Right coronary artery is a dominant vessel.  There are two overlapping  stents in the proximal vessel.  In the proximal stent there is a diffuse 50%  stenosis.  The second stent in the mid vessel is widely patent.  The distal  right coronary artery has a 20% stenosis.  Following this, there is a stent  in the  distal right coronary which is widely patent with 0% stenosis within  the stent.  The distal right coronary artery gives rise to normal sized  posterior descending artery and two small to normal sized posterolateral  branches.   IMPRESSION:  1.  Normal left ventricular systolic function.  2.  Moderate, but nonobstructive coronary artery disease with patent stents      in the right coronary artery.  There is a 50% stenosis within the more      proximal stent in the right coronary artery.  However, this is unchanged      compared to previous catheterization.  In addition, the disease in the      left anterior descending artery is unchanged compared to previous      catheterization.   RECOMMENDATIONS:  Medical therapy.  If the patient does have recurrent  symptoms would consider a stress nuclear study.  However, patient reports  having had a stress nuclear study since his last catheterization which did  not show any significant ischemia.       MWP/MEDQ  D:  10/13/2004  T:  10/13/2004  Job:  [id number]   cc:   Weyman Pedro, M.D.

## 2010-09-16 NOTE — Consult Note (Signed)
Michael [name], ZACCONE NO.:  [id number]   MEDICAL RECORD NO.:  [id number]          PATIENT TYPE:  INP   LOCATION:  1823                         FACILITY:  [facility]   PHYSICIAN:  Charlton Haws, M.D.     DATE OF BIRTH:  16-Jul-1931   DATE OF CONSULTATION:  02/29/2004  DATE OF DISCHARGE:                                   CONSULTATION   HISTORY OF PRESENT ILLNESS:  The patient is a delightful 77 year old patient  of Dr. Jonelle Sidle and Dr. Linna Darner in [place].  He was recently admitted  at the end of October with unstable angina, and had a angioplasty and  stenting of the distal right coronary artery.  He did have a subendocardial  myocardial infarction with distal embolization.  He had some residual LAD  disease.  Since going home, he has had some recurrent chest pain.  Some of this is  exertional, but a lot of it is constant.  It is relieved with nitroglycerin.  He came to the emergency room where there were no acute electrocardiogram  changes.  I talked to the patient and his wife at length and recommended a re-look  heart catheterization, particularly given the distal embolization, to make  sure that there is no restenosis.  The patient has hypercholesterolemia, and is on Lipitor.  He has some chronic obstructive pulmonary disease and takes Combivent.  The past medical history if otherwise remarkable for hypothyroidism on  replacement.  The patient has been taking his aspirin and Plavix.  He has  also been ambulating.  He has gotten up to 30 minutes of walking before he  started having chest pains.  The past medical history is otherwise remarkable for a history of tobacco  abuse, anxiety, insomnia.  He is status post a cholecystectomy.   ALLERGIES:  PENICILLIN which causes a rash.  His blood pressure gets low on  MORPHINE.  He also gets a rash with OXYCODONE.   MEDICATIONS:  1.  Lipitor 40 mg daily.  2.  Combivent p.r.n.  3.  Nexium 40 mg daily.  4.  Advair  100/50 mg b.i.d.  5.  Synthroid 100 mcg daily.  6.  Trazodone 50 mg daily.  7.  Plavix 75 mg daily.  8.  One aspirin daily.   PHYSICAL EXAMINATION:  SKIN:  He has a tattoo on his right forearm.  VITAL SIGNS:  Blood pressure 130/80, pulse 88 and regular.  LUNGS:  Clear.  NECK:  Carotids are normal.  HEART:  There is an S1, S2 without murmur, rub, gallop, or click.  There are  no rubs.  Distal pulses are intact.  He has no bruits and no significant  bruising.  His electrocardiogram shows no acute changes.  A chest x-ray and labs are pending.   IMPRESSION:  Recurrent chest pain soon after subendocardial myocardial  infarction with stenting of his right coronary artery and residual disease.   PLAN:  He will be referred for a cardiac catheterization.  He is on heparin  and nitroglycerin.  Further recommendations will be based on the results of  this.        ___________________________________________  Charlton Haws, M.D.    PN/MEDQ  D:  02/29/2004  T:  02/29/2004  Job:  [id number]   cc:   Jonelle Sidle, M.D. [facility]   Michael [name], M.D.

## 2010-09-16 NOTE — Letter (Signed)
February 13, 2006    Michael Pedro, MD  [address]  [place], [place] [address]   RE:  Michael [name], RADLER  MRN:  [id number]  /  DOB:  [birth year]   Dear Dr. Eliberto Ivory,   It was my pleasure to see Michael [name] today at the [facility].  As you know, Michael [name] is a very pleasant 77 year old gentleman  who is well known to our practice from previous percutaneous coronary  interventions.  He has had recent problems with postprandial abdominal pain  and has undergone an initial evaluation that has included a CAT scan of his  abdomen.  The CT scan demonstrated significant stenosis in the proximal  portion of the superior mesenteric artery.  He had some plaque seen in the  celiac artery but this appeared nonobstructive and he had a small diameter  inferior mesenteric artery.  As you know, he has been seen by Dr. Cleotis Nipper  who has recommended evaluation for intestinal angina.   Michael [name] describes a three-month history of right-sided abdominal pain.  He has some abdominal pain at rest but has worsened symptoms after eating.  He states that large meals significantly exacerbates his pain.  The pain  occurs throughout the right side and sometimes goes to the back.  He has not  had any nausea, vomiting or diarrhea.   He has had a thorough evaluation that has included upper endoscopy as well  as a CT scan of the abdomen.  He has a prior cholecystectomy back in 1995.  He denies anorexia or weight loss.  The specific type of food does not seem  to make a difference in his pain symptoms.  He has noted that occasional  nitroglycerin has helped with his abdominal pain.   PAST MEDICAL HISTORY:  1. Pertinent for coronary artery disease.  He has undergone multiple      cardiac catheterizations and he has had coronary stent placement on      three separate occasions, most recently in October of 2005.  His most      recent heart cath in May of this year did not necessitate any  intervention.  His chart is in our [place] office and I do not have all      the specifics of his coronary disease at present time.  Of note, he has      not had any change in his anginal pattern.  He takes approximately two      nitroglycerin per month.  2. Cholecystectomy in 1995.  3. Knee replacement in 2005.  4. Neck disc effusion 2004.  5. Gastroesophageal reflux disease.  6. Chronic obstructive pulmonary disease.  7. Osteoarthritis.  8. Thyroid disease.  9. Dyslipidemia.   FAMILY HISTORY:  Pertinent for his father died at age 17 in his car  accident.  His mother died at age 79 of dementia.  He has three brothers who  died in their 34's of heart disease.   SOCIAL HISTORY:  The patient lives in [place].  He is widowed and has  five children.  He continues to smoke less than a half pack per day but has  been smoking for 50 years.  He does not drink alcohol.  He exercises  regularly at cardiac rehab three times weekly.   CURRENT MEDICATIONS:  1. Levothyroxine 125 mcg daily.  2. Plavix 75 mg daily.  3. Lisinopril 10 mg daily.  4. Protonix 40 mg daily.  5. Aspirin 81 mg daily.  6.  Lipitor 40 mg daily.  7. Multivitamin daily.  8. Trazodone 100 mg at bed time.  9. Nitroglycerin as needed.  10.Advair Diskus as needed.  11.Combivent as needed.  12.Carafate 1 gram daily.   ALLERGIES:  1. PENICILLIN.  2. OXYCODONE.   REVIEW OF SYSTEMS:  A complete 12-point review of systems was performed.  Pertinent positives includes anemia, seasonal allergies, COPD, depression  and elevated PSA level, gastroesophageal reflux, joint pain secondary to  arthritis.  All other systems were reviewed and are negative except as  described above.   PHYSICAL EXAMINATION:  The patient is alert and oriented.  He is in no acute  distress.  His weight is 160 pounds.  Blood pressure is 90/60, heart rate  46, respiratory rate is 12.  HEENT:  Oropharynx is clear.  Moist oral mucosa.  Sclerae anicteric.   Conjunctivae pink.  NECK:  Normal carotid upstrokes without bruits.  Jugular venous pressure is  normal.  LUNGS:  Clear to auscultation bilaterally.  CARDIOVASCULAR:  The apex is discreet and nondisplaced.  The heart sounds  are slightly distant.  There are no murmurs or gallops.  ABDOMEN:  Soft.  There is right-sided tenderness, most pronounced in the  right lower quadrant.  There is no rebound or guarding.  There are positive  bowel sounds.  There are no abdominal bruits present.  There is no  organomegaly present.  EXTREMITIES:  There is no clubbing, cyanosis, or edema.  Peripheral pulses  are intact and equal throughout.  SKIN:  There is no rash.  Skin is warm and dry.  LYMPHATICS:  There is no adenopathy.  NEUROLOGICAL:  Grossly intact.  5/5 motor strength in the arms and legs  bilaterally.   EKG demonstrates marked sinus bradycardia with first degree AV block with a  PR interval of 214 msec.  There is left axis deviation and the ECG is  otherwise normal.  CT scan of the abdomen is reported as high-grade stenosis  in the superior mesenteric artery approximately 2 cm from the origin.  There  is calcific plaque near the celiac artery origin that does not appear  obstructive and there is a small internal mesenteric artery measuring less  than 2 mm at the origin.  There were some hypodense cortical lesions in the  kidneys and no other abnormalities were seen.   ASSESSMENT:  Michael [name] is a 77 year old gentleman with coronary artery  disease as well as peripheral arterial disease.  His symptoms are somewhat  suggestive of mesenteric ischemia and intestinal angina.  He does not have a  classic story as he has not loss any weight in the recent past.  However,  his symptoms are clearly postprandial and he has no other explanation based  on other studies.  I have reviewed the risks and indications for abdominal angiography and potential percutaneous superior mesenteric artery   intervention.  The patient understands and would like to proceed.  I have  told Mr. Hollibaugh that I think there is a reasonable chance that intervening  on a high-grade superior mesenteric artery stenosis may improve his symptoms  but I am not certain of this.  He and his daughter both understand this and  would like to proceed accordingly.  We will continue him on his current  medical therapy and schedule him for an  abdominal angiography and potential superior mesenteric artery intervention.  I will plan on doing this procedure along side with Dr. Geralynn Rile in the  peripheral vascular  lab.   Dr. Eliberto Ivory, I would like to thank you again for the opportunity to evaluate  Mr. Santerre.  Please feel free to contact me at any time with questions  regarding his care.  I will be in contact with you after his abdominal  angiogram next week.   Sincerely,     Veverly Fells. Excell Seltzer, MD    MDC/MedQ  /  Job #:  [id number]  DD:  02/13/2006 / DT:  02/14/2006   CC:    Sherilyn Cooter A. Cleotis Nipper, M.D.

## 2010-09-16 NOTE — Procedures (Signed)
[facility]  Patient:    Michael [name], Michael [name]                        MRN: [id number] Proc. Date: 08/28/00 Adm. Date:  [id number] Attending:  Thyra Breed CC:         Weyman Pedro, M.D.   Procedure Report  PROCEDURE:  Right occipital nerve block.  DIAGNOSIS:  Recurrent headaches with underlying cervical spondylosis, neck pain, and headache.  INTERVAL HISTORY:  The patient continues to note improvement with the injections.  His medications are unchanged.  He does have a bit of pain today. He rates it at 5/10.  PHYSICAL EXAMINATION:  Blood pressure 141/71, heart rate 61, respiratory rate 16, O2 saturations 97%.  The patient demonstrates no change with range of motion of his neck.  He is tender over the right greater and lesser occipital grooves.  These areas were marked.  DESCRIPTION OF PROCEDURE:  After informed consent was obtained, the patient was placed in the sitting position and monitored.  The areas marked were prepped with Betadine x 3.  In injected each site with 1.5 cc of a mixture of 1% lidocaine and 0.5% levobupivacaine in a 1:1 ratio mixed with 0.25 cc of Medrol in a total volume of 5 cc.  The needle was removed intact.  The patient noted that he had good anesthesia over the occipital area on the right side.  Postprocedure condition - stable.  DISCHARGE INSTRUCTIONS: 1. Resume previous diet. 2. Limitations on activities per instruction sheet. 3. Continue on current medications. 4. Follow up with me in two weeks for a repeat injection. DD:  08/28/00 TD:  08/28/00 Job: [id number]

## 2010-09-16 NOTE — Consult Note (Signed)
Michael [name], Michael [name]                           ACCOUNT NO.:  [id number]   MEDICAL RECORD NO.:  [id number]                  PATIENT TYPE:   LOCATION:                                       FACILITY:   PHYSICIAN:  Lionel December, M.D.                 DATE OF BIRTH:  [birth year]   DATE OF CONSULTATION:  03/18/2002  DATE OF DISCHARGE:                                   CONSULTATION   REASON FOR CONSULTATION:  Chest pain.   HISTORY OF PRESENT ILLNESS:  The patient is a 77 year old Caucasian male who  is referred through the courtesy of Dr. Eliberto Ivory for evaluation of chest pain  that he has had for over a year.  He describes this to be a bad cold or  gnawing which is almost constant.  At times he is more aware than at other  times.  He has had intermittent hoarseness, and he also has cough, usually  in the a.m. and p.m.  He has not experienced any nausea, vomiting, or  dysphagia but does complain of intermittent soreness.  He has a history of  cardiac disease.  He had coronary stenting on December 03, 2001, to the artery  that was 85% occluded.  He was noted to have noncritical 65% stenosis to  another artery.  He was readmitted two weeks later and had another  catheterization on December 17, 2001, and the stented artery was patent, and  there were no new abnormalities.  He recalled that while he continues to  have chest pain he has not experienced dyspnea or weakness that he was  having prior to coronary stenting.  He has been on PPI for three months but  really could not tell any difference.  He denies abdominal pain, melena, or  rectal bleeding.  His bowels move regularly.  He has a very good appetite  and has not lost any weight.   MEDICATIONS:  1. Levoxyl 100 mcg q.d.  2. Enteric-coated aspirin 81 mg q.d.  3. MVI q.d.  4. Prilosec 20 mg q.d.  5. Nitroglycerin spray p.r.n.  6. Combivent inhaler 2 puffs b.i.d.  7. Advair Diskus 1 puff b.i.d.  8. Zoloft 50 mg q.h.s.  9. Pravachol 40 mg  q.h.s.  10.      Trazodone 50-100 mg q.h.s.   PAST MEDICAL HISTORY:  Medical problems include hypercholesterolemia,  anxiety, insomnia, asthma, and COPD.  Remote history of peptic ulcer disease  which was a clinical diagnosis.  He also has a history of thyrotoxicosis  treated with radioiodine ablation, and he is now on maintenance therapy.  History of coronary artery disease as above.  He had right knee arthroscopy  with excision of a cyst in the 1980s.  He had cholecystectomy in 1995, for  biliary dyskinesia.  At that time he presented with abdominal pain.  He did  have colonoscopy by Dr. Cleotis Nipper in 1996, which  reportedly was normal.   ALLERGIES:  PENICILLIN, causing rash.   FAMILY HISTORY:  Both parents are deceased.  Father died of MVA at age 23,  and mother lived to be 20.  He has two sisters in good health.  One brother  is living and four are deceased.  One died at a young age.  Three died of  MI, all in their 58s.  Two brothers died a week apart nine months ago,  resulting in stress disorder and insomnia, etc..   SOCIAL HISTORY:  He is married.  His wife has had recurrence of breast CA,  another source of a great deal of stress for him.  They have five children  who are in good health.  He is a retired Psychologist, occupational.  He retired in 1997.  He  also worked in [employer] for over 20 years.  He has been smoking since he was  77 years old and trying very hard to quit.  He is now smoking a pack every  four days.  He drank alcohol socially and every now and then heavily but has  not had any in the last six years.   PHYSICAL EXAMINATION:  GENERAL:  Pleasant, well-developed, well-nourished  Caucasian male who is in no acute distress.  VITAL SIGNS:  Weight 163 pounds, height 5 feet 8 inches.  Pulse 60 per  minute, blood pressure 128/72, temperature 96.5.  HEENT:  Conjunctivae pink.  Sclerae nonicteric.  Oropharyngeal mucosa is  normal.  He has complete upper and partial lower plate.  NECK:   Without masses or thyromegaly.  Carotids are 2+ bilaterally.  Without  bruits.  CARDIAC:  With regular rhythm.  Normal S1 and S2.  No murmur or gallop  noted.  LUNGS:  Clear to auscultation.  ABDOMEN:  Symmetrical.  Bowel sounds are normal.  Palpation reveals soft  abdomen without tenderness, organomegaly, or masses.  He does not have  clubbing or peripheral edema.   ASSESSMENT:  The patient is a 77 year old Caucasian male who has chronic  retrosternal chest pain.  He was found to have high-grade stricture to one  of the coronary arteries which has been stented, but this has not had any  effect on his chest pain.  He also has been on a PPI for three months but,  again, without any benefit.  He does have intermittent hoarseness and cough  as well as soreness.  These symptoms could be a clue that this is due to  gastroesophageal reflux disease, but these could also be originating from  his respiratory tract given that he is a chronic smoker.  He describes his  pain to be bad cold, and I wonder if he has chronic tracheobronchitis.  He  could have reflux esophagitis not responding to therapy.   He may have Helicobacter pylori infection according to Dr. Magnus Ivan note.  He mentions not sure whether he should be treated or not.  I do not feel  that if he has Helicobacter pylori that it has anything to do with his  symptoms.   RECOMMENDATIONS:  1. The patient is strongly advised that he must quit cigarette smoking     altogether.  2. He will undergo a diagnostic esophagogastroduodenoscopy at [facility] in the     near future.   I have reviewed the procedure and risks with the patient, and he is  agreeable.   I would like to thank Dr. Eliberto Ivory for the opportunity to participate in the  care of  this nice patient.                                               Lionel December, M.D.    NR/MEDQ  D:  03/18/2002  T:  03/19/2002  Job:  [id number]   cc:   Weyman Pedro, M.D.   Dr. Diona Browner [facility]

## 2010-09-16 NOTE — Cardiovascular Report (Signed)
[name], [name] NO.:  [id number]   MEDICAL RECORD NO.:  [id number]          PATIENT TYPE:  INP   LOCATION:  2027                         FACILITY:  [facility]   PHYSICIAN:  Salvadore Farber, M.D. LHCDATE OF BIRTH:  [birth year]   DATE OF PROCEDURE:  09/08/2005  DATE OF DISCHARGE:                              CARDIAC CATHETERIZATION   PROCEDURE:  Left heart catheterization, left ventriculography, coronary  angiography, Starclose closure right common femoral arteriotomy site.   INDICATIONS:  Michael [name] is a 77 year old gentleman with coronary disease  for which is status post multiple prior stent placements in his RCA.  He now  presents with chest discomfort occurring primarily at rest.  He is ruled out  for myocardial infarction by serial enzymes and electrocardiograms.  Given  his known disease, he is referred for diagnostic angiography.   PROCEDURAL TECHNIQUE:  Informed consent was obtained.  Under 1% lidocaine  local anesthesia, a 5-French sheath was placed in the right common femoral  artery using modified Seldinger technique.  Diagnostic angiography and  ventriculography were performed using JL-4, JR-4 and pigtail catheters.  The  arteriotomy was then closed using a Starclose device.  Complete hemostasis  was obtained.  He was then transferred to holding room in stable condition.   COMPLICATIONS:  None.   FINDINGS:  1.  LV:  123/5/12.  EF 60% without regional wall motion abnormality.  2.  No aortic stenosis or mitral regurgitation.  3.  Left main:  Angiographically normal.  4.  LAD:  Relatively large vessel giving rise to a single large diagonal.      There is a 50-60% stenosis of the mid portion of the LAD which is      unchanged from previous.  5.  Circumflex:  Relatively small vessel giving rise to a single marginal.      It is angiographically normal.  6.  RCA:  Large and tortuous vessel.  There are two stents in the proximal      and mid vessel and  which overlapped.  There is area of 40-50% in-stent      restenosis in the mid portion of the stents.  This is unchanged from      previous.  The previously placed Cypher stent in the distal RCA is      widely patent without any in-stent restenosis.   IMPRESSION/PLAN:  There has been no change in his coronary anatomy compared  with June 2006.  I suspect a noncardiac etiology to his chest pain, perhaps  stress due to his wife's recent diagnosis of pancreatic cancer.  We will  discharge him home today.      Salvadore Farber, M.D. [facility]  Electronically Signed     WED/MEDQ  D:  09/08/2005  T:  09/09/2005  Job:  [id number]   cc:   Truitt Merle, M.D. [facility]  [address]  [place]  [place] [address]

## 2010-09-16 NOTE — Discharge Summary (Signed)
NAMEROYER, CRISTOBAL NO.:  [id number]   MEDICAL RECORD NO.:  [id number]          PATIENT TYPE:  INP   LOCATION:  2027                         FACILITY:  [facility]   PHYSICIAN:  Salvadore Farber, M.D. LHCDATE OF BIRTH:  [birth year]   DATE OF ADMISSION:  09/07/2005  DATE OF DISCHARGE:  09/08/2005                                 DISCHARGE SUMMARY   TIME AT DISCHARGE:  22 minutes.   PROCEDURES:  1.  Cardiac catheterization.  2.  Coronary arteriogram.  3.  Left ventriculogram.   HOSPITAL COURSE:  Mr. Yanke is a 77 year old male with a history of  coronary artery disease.  He had a stent to his RCA in 2005 and a moderate  amount of obstructive disease by catheterization in 2006.  He recently had  weakness with lack of energy and then had an episode of substernal chest  pain.  Symptoms were relieved by nitroglycerin.  He was seen in the office  and admitted for further evaluation and treatment.   His cardiac enzymes were negative for MI.  It was felt that a cardiac  catheterization was indicated to further define his anatomy.  The cardiac  catheterization was performed on Sep 08, 2005.  The cardiac catheterization  showed 40% in-stent restenosis in the right coronary artery and 50-60%  stenosis in the LAD, which was unchanged.  Dr. Samule Ohm evaluated the films  and felt that there was no change in his coronary anatomy since June 2006.  Dr. Samule Ohm suspected a noncardiac etiology to his chest pain, as he has been  under significant stress recently because of his wife's diagnosis with  pancreatic cancer.   Post procedure Mr. Bogan vital signs are within normal limits.  His groin  is without ecchymosis or hematoma.  If he completes his bed rest without  difficulty, he is tentatively considered stable for discharge on Sep 08, 2005, with outpatient follow-up arranged.   DISCHARGE INSTRUCTIONS:  1.  His activity level is to be increased gradually.  2.  He is toc all  our office with problems with the catheterization site.  3.  He is to follow up with Dr. Andee Lineman on May 17 at 2:20 and with Dr. Eliberto Ivory      as needed.   DISCHARGE MEDICATIONS:  1.  Levothyroxine 125 mcg daily.  2  Plavix 75 mg daily.  1.  Lisinopril 10 mg daily.  2.  Protonix 40 mg daily.  3.  Aspirin 81 mg daily.  4.  Lipitor 40 mg daily.  5.  Multivitamin daily.  6.  Trazodone 50-100 mg p.r.n.  7.  Nitroglycerin sublingual p.r.n.  8.  Advair b.i.d.  9.  Combivent p.r.n.      Theodore Demark, P.A. [facility]      Salvadore Farber, M.D. [facility]  Electronically Signed    RB/MEDQ  D:  09/08/2005  T:  09/09/2005  Job:  [id number]   cc:   [facility] [place], [place]   Dr. Edilia Bo, [place]

## 2010-09-16 NOTE — Discharge Summary (Signed)
NAMECASTLE, LAMONS NO.:  [id number]   MEDICAL RECORD NO.:  [id number]          PATIENT TYPE:  OBV   LOCATION:  2027                         FACILITY:  [facility]   PHYSICIAN:  Learta Codding, M.D. LHCDATE OF BIRTH:  1931-08-01   DATE OF ADMISSION:  09/07/2005  DATE OF DISCHARGE:  09/10/2005                                 DISCHARGE SUMMARY   ADDENDUM:  Mr. Tetrault was initially prepared for discharge on Sep 09, 2005;  however, he experienced some post cardiac catheterization using to his right  groin cath site.  Pressure dressing was applied with continued oozing.  Sandbag was placed on patient overnight.  The patient was up and ambulating  this morning with just a trace of oozing; otherwise stable.  The patient is  being discharged home at this time.  At time of discharge, the patient was  sinus brady and sinus rhythm, afebrile, heart rate 51, blood pressure 130/69  and satting 94% on room air.  The patient is being discharged home with  instructions to keep dressing to cath site x24 hours and remove in a.m.  The  patient instructed to call answering service with any further problems from  cath site.  Follow up with Dr. Andee Lineman on May 17 at 2:20 and Dr. Eliberto Ivory as  needed.      Dorian Pod, NP      Learta Codding, M.D. [facility]  Electronically Signed    MB/MEDQ  D:  09/10/2005  T:  09/11/2005  Job:  [id number]   cc:   Dr. Eliberto Ivory

## 2010-09-16 NOTE — Cardiovascular Report (Signed)
Michael [name], Michael [name] NO.:  [id number]   MEDICAL RECORD NO.:  [id number]          PATIENT TYPE:  INP   LOCATION:  3733                         FACILITY:  [facility]   PHYSICIAN:  Carole Binning, M.D. LHCDATE OF BIRTH:  23-Feb-1932   DATE OF PROCEDURE:  02/15/2004  DATE OF DISCHARGE:                              CARDIAC CATHETERIZATION   PROCEDURES PERFORMED:  1.  Left heart catheterization with coronary angiography and left      ventriculography.  2.  Percutaneous transluminal coronary angioplasty with placement of a drug-      eluting stent in the distal right coronary artery.   INDICATION:  Michael [name] is a 77 year old male who has had previous coronary  interventions.  He presented to [facility] with unstable angina and  was transferred to [facility].   CATHETERIZATION PROCEDURAL NOTE:  A 6-French sheath was placed in the right  femoral artery.  Coronary angiography was performed using standard Judkins 6-  French catheters.  Left ventriculography was performed with an angled  pigtail catheter.  Contrast was Omnipaque.  There were no complications.   RESULTS:   HEMODYNAMICS:  Left ventricular pressure 146/14.  Aortic pressure 142/70.  There is minimal to no gradient across the aortic valve on catheter  pullback.   LEFT VENTRICULOGRAPHY:  There is possibly mild hypokinesis of the anterior  wall.  Otherwise, wall motion is normal, ejection fraction calculated at  65%.  There is no mitral regurgitation.   CORONARY ARTERIOGRAPHY (RIGHT DOMINANT):  Left main is normal.   Left anterior descending artery has a tubular 70% stenosis in the mid-  vessel.  This has been interrogated previously with fractional flow reserve  and found not to be hemodynamically significant.  It does not appear to be  changed compared to previous catheterizations.  The distal LAD has a 30%  stenosis.  The LAD gives rise to a small first diagonal and a normal-sized  second  diagonal branch.   Left circumflex gives rise to a single small obtuse marginal branch.  The  left circumflex is normal.   Right coronary artery is a large dominant vessel.  It is very tortuous with  a very sharp shepherd's crook-type bend in the proximal vessel.  There is a  30% stenosis in the proximal right coronary artery.  There are tandem  overlapping stents in the mid-vessel with a 40% to 50% stenosis within these  stents.  Distal to the stents there is a tubular 20% stenosis.  In the  distal right coronary artery just proximal to the posterior descending  artery, there is a discrete 95% stenosis.  The distal right coronary artery  gives rise to a normal-sized posterior descending artery which has a 30%  stenosis at its ostium.  There is also a small first posterolateral branch  and normal-sized second and third posterolateral branches arising from the  distal right coronary artery.   IMPRESSIONS:  1.  Preserved left ventricular systolic function.  2.  Two-vessel coronary artery disease characterized by critical disease in      the distal right coronary  artery.  There is moderate disease in the mid      left anterior descending artery which is not obstructed, based on      previous functional assessments.   PLAN:  Percutaneous coronary intervention to the right coronary artery, see  below.   PERCUTANEOUS TRANSLUMINAL CORONARY ANGIOPLASTY PROCEDURAL NOTE:  Following  completion of the diagnostic catheterization, we proceeded with percutaneous  coronary intervention.  We utilized the pre-existing 6-French sheath in the  right femoral artery.  Angiomax was administered per protocol.  We used a 6-  [place] shepherd's crook right guiding catheter.  We initially advanced an  ASAHI soft coronary guidewire under fluoroscopic guidance into the distal  vessel.  However, because of significant tortuosity of the proximal and mid-  vessel, we were not able to advance this wire __________   much beyond the  lesion.  We therefore advanced a luge coronary guidewire along side our  ASAHI wire and were able to advance this luge wire into the very distal  vessel.  We then left both wires in place to utilize the buddy-wire  technique.  We then performed PTCA with a 3.0 x 12.0-mm Quantum balloon  advanced over the luge wire.  This balloon was inflated to 6 atmospheres.  Following this, we advanced a 3.0 x 13.0-mm CYPHER drug-eluting stent over a  luge wire.  We positioned this stent across the lesion and pulled out ASAHI  wire back.  We then deployed the stent at 11 atmospheres.  Following this,  we went back in with a 3.0 x 12.0-mm Quantum balloon and inflated it to 16  and then 18 atmospheres within the stent.  Intermittent doses of  nitroglycerin were administered.  Final angiographic images were obtained,  revealing patency of the right coronary artery at the stent site with 0%  residual stenosis and TIMI-3 flow.  Of note, the very distal portion of the  third posterolateral branch appeared to be occluded by a distal  thromboembolism.  Because of the very distal nature of this embolus and  small vessel size and extreme tortuosity of the vessel proximal, we did not  attempt to treat that area.   COMPLICATIONS:  Distal embolization with occlusion of the very distal  portion of a small posterolateral branch, as described.   RESULTS:  Successful PTCA with placement of a drug-eluting stent in the  distal right coronary artery.  A 95% stenosis with probable thrombus was  reduced to 0% residual with TIMI-3 flow.   PLAN:  Angiomax will be discontinued.  The patient will be treated with  Plavix for the recommended 1 year.  He needs continued aggressive risk  factor modification.  The patient will be observed in our stepdown unit and  serial enzyme markers will be obtained to rule out and assess degree of myocardial damage from the distal thromboembolism.         ___________________________________________  Carole Binning, M.D. [facility]   MWP/MEDQ  D:  02/15/2004  T:  02/15/2004  Job:  [id number]   cc:   Weyman Pedro, M.D   Jonelle Sidle, M.D. [facility]

## 2010-09-16 NOTE — Discharge Summary (Signed)
Michael [name], Michael [name]                         ACCOUNT NO.:  [id number]   MEDICAL RECORD NO.:  [id number]                   PATIENT TYPE:  INP   LOCATION:  5029                                 FACILITY:  [facility]   PHYSICIAN:  Loreta Ave, M.D.              DATE OF BIRTH:  [birth year]   DATE OF ADMISSION:  11/26/2003  DATE OF DISCHARGE:  11/29/2003                                 DISCHARGE SUMMARY   ADMISSION DIAGNOSIS:  Advanced degenerative joint disease of the right knee.   DISCHARGE DIAGNOSES:  1.  Advanced degenerative joint disease of the right knee.  2.  History of asthma.   PROCEDURE:  Right total knee replacement.   HISTORY:  A 77 year old white male with persistent pain and discomfort of  the right knee over the past three years.  He is now having pain with all  activity as well as pain at nighttime.  He does have radiographic end-stage  DJD of the medial compartment.  He is indicated at this time for right total  knee replacement versus medial unit compartmental knee replacement.   HOSPITAL COURSE:  A 77 year old white male admitted November 26, 2003 after  appropriate laboratory studies were obtained, as well as 1 gram vancomycin  IV on call at the operating room, he was taken to the operating room where  he underwent a right lateral meniscectomy arthroscopically and medial  unicompartmental knee replacement.  He tolerated the procedure well.  He was  continued on vancomycin 1 gram IV q.12h. times two doses.  Heparin 5000  units subcu q.12h. was started until his Coumadin became therapeutic.  A  Foley was placed intraoperatively.  Consults with PT and OT were made.  Ambulating weight-bearing as tolerated on the right.  CPM 0-50 degrees for 8-  10 hours per day increasing daily by 10 degrees.  Hemovacs were placed  intraoperatively.  He was placed on a reduced dose Dilaudid PCA pump.  He  was allowed out of bed to a chair the following day.  OS-Cal 500 mg daily  was  started for hypocalcemia.  Benadryl 25 mg p.o. q.6h. p.r.n. pruritus.  On postop day two he had his dressing removed and his Hemovacs were pulled.  He was able to ambulate in PT comfortably.  His heparin was discontinued on  November 28, 2003, and he was started on Lovenox 30 mg injected b.i.d.  He was  instructed in Lovenox teaching, however, by the time of his discharge on  November 29, 2003, his pro time was therapeutic and this was discontinued.  He  was then discharged on November 29, 2003, to return back to our office in 10-14  days for recheck evaluation.  EKG reveals marked sinus bradycardia with  occasional premature ventricular complexes which are new since prior  tracing.  Inferior infarct age indeterminate.  Radiographic studies right  knee revealed satisfactory appearance following medial compartment joint  replacement.   LABORATORY STUDIES:  Admitted with a hemoglobin of 13.6, hematocrit 40.4  percent, white count 11,700, platelets 307,000.  Discharge hemoglobin 11.9,  hematocrit 35.4 percent, white count 8900, platelets 259,000.  Discharge pro  time was 19.5 with an INR of 2.0.  Preop chemistries -- sodium 138,  potassium 4.6, chloride 104, CO2 29, glucose 85, BUN 10, creatinine 1.2,  calcium 9.4, total protein 6.4, albumin 3.8, AST 23, ALT 16, ALP78, total  bilirubin 0.6.  Discharge sodium 138, potassium 4.1, chloride 103, CO2 29,  glucose 98, BUN 5, creatinine 1.2, calcium 9.0.  Urinalysis was benign for a  voided urine.  Blood type was O positive, antibody screen negative.   DISCHARGE INSTRUCTIONS:  Given a prescription for Percocet 5/325 1 to 2  every 4 hours as needed for pain.  Coumadin 5 mg as directed by Genevieve Norlander  pharmacist.  Colace 100 mg b.i.d.  Iron sulfate 325 1 daily.  Activity as  taught in PT.  No restrictions in diet.  Keep his wound clean and dry and  cover daily.  He may shower.  He is also to follow the blue instruction  sheet.  [facility] PT, RN, and CPM as  provided.  Follow up in 10-14  days for recheck evaluation.  Call in the interim if he has problems.      Oris Drone Petrarca, P.A.-C.                Loreta Ave, M.D.    BDP/MEDQ  D:  01/01/2004  T:  01/02/2004  Job:  [id number]

## 2010-09-16 NOTE — Op Note (Signed)
NAMEDEREK, LAUGHTER NO.:  [id number]   MEDICAL RECORD NO.:  [id number]                   PATIENT TYPE:  OIB   LOCATION:  2899                                 FACILITY:  [facility]   PHYSICIAN:  Kathaleen Maser. Pool, M.D.                 DATE OF BIRTH:  27-Jan-1932   DATE OF PROCEDURE:  08/05/2002  DATE OF DISCHARGE:                                 OPERATIVE REPORT   PREOPERATIVE DIAGNOSES:  C3-4, C4-5, and C5-6 spondylosis with instability  and chronic neck pain.   POSTOPERATIVE DIAGNOSES:  C3-4, C4-5, C5-6 spondylosis with instability and  chronic neck pain.   PROCEDURE:  C3-4, C4-5, and C5-6 anterior cervical diskectomy and fusion  with allograft anterior plating.   SURGEON:  Kathaleen Maser. Pool, M.D.   ASSISTANT:  Donalee Citrin, M.D.   ANESTHESIA:  General orotracheal.   INDICATIONS:  The patient is a 77 year old male with history of chronic neck  pain failing all conservative management.  Work-up has demonstrated severe  spondylosis at C3-4, C4-5, and C5-6 with evidence of dynamic instability  present on bending films.  The patient had been counseled as to his options.  He has decided to proceed with three level anterior cervical diskectomy and  fusion with allograft anterior plating with hopes of improvement of  symptoms.   OPERATIVE NOTE:  The patient brought to the operating room and placed on the  operating table in a supine position.  After an adequate level of anesthesia  achieved, patient positioned supine with his neck slightly extended, held in  place with halter traction.  The patient's anterior cervical region was  prepped and draped sterilely.  A 10 blade was used to make a linear incision  overlying the C4 vertebral body.  This was carried down sharply to the  platysma.  The platysma was then divided vertically.  Dissection then  proceeded along the medial border of the sternocleidomastoid muscle and  carotid sheath.  Trachea and esophagus were  mobilized and retracted towards  the left.  Prevertebral fascia was stripped off the anterior spinal column  bilaterally using electrocautery.  Deep self-retaining retractor was placed.  Intraoperative fluoroscopy was used and the C3-4, C4-5, and C5-6 levels were  confirmed.  Disk space was then incised with 15 blade in rectangular  fashion.  A wide disk space clean-out was then achieved at all three levels  using pituitary Rongeurs, forward- and back-angled Carlens curettes,  Kerrison rongeurs, and the high-speed drill.  All elements of the disk were  removed down to the level of the posterior annulus.  Microscope was brought  into the field and used throughout the remainder of diskectomies, starting  first at C3-4.  At C3-4, remaining aspects of annulus and osteophytes  removed using the high-speed drill down to the level of the posterior  longitudinal ligament.  Posterior longitudinal ligament was then elevated  and  resected free from the fascia using Kerrison rongeurs.  The underlying  thecal sac was identified.  Wide central decompression was performed by  undercutting the bodies of C3 and C4.  Decompression then proceeded out to  each neural foramen.  C4 nerve roots were identified proximally and wide  anterior foraminotomies were formed along their course.  At this point, a  very thorough decompression was achieved.  There was no evidence of injury  to thecal sac or nerve roots.  The procedure was then repeated at C4-5 and  C5-6, again without complication.  At all three levels the wound was  irrigated with antibiotic solution.  Gelfoam was placed topically for  hemostasis, found to be good.  A 7-mm patellar wedge allograft was then  impacted into place at all three levels and recessed approximately 1 mm from  the anterior cortical surface.  A 57 mm Atlantis anterior cervical plate was  then placed over the C3, C4, C5, C6 levels.  This was then attached under  fluoroscopic guidance  using 15 mm variable-angle screws, two each at all  four levels.  All eight screws were given a final tightening and found to be  solid within bone.  Locking screws were engaged at all levels.  Final images  revealed good position of bone grafts and hardware at proper operative level  with normal alignment of the spine.  Wound was then irrigated with  antibiotic solution.  Gelfoam was placed along the longus for hemostasis,  found to be good.  It was then removed.  Wound was then inspected for  hemostasis, found to be good.  It was then closed in typical fashion.  Steri-  Strips and sterile dressing were applied.  There were no apparent  intraoperative complications.  The patient tolerated procedure well and he  returned to recovery room postoperatively.                                               Henry A. Pool, M.D.    HAP/MEDQ  D:  08/05/2002  T:  08/05/2002  Job:  [id number]

## 2010-09-16 NOTE — Procedures (Signed)
[facility]  Patient:    Michael [name], Michael [name]                        MRN: [id number] Proc. Date: 02/20/00 Adm. Date:  [id number] Attending:  Thyra Breed CC:         Stefani Dama, M.D.  Weyman Pedro, M.D.   Procedure Report  PREOPERATIVE DIAGNOSIS:  Cervical spondylosis with neck pain.  POSTOPERATIVE DIAGNOSIS:  Cervical spondylosis with neck pain.  PROCEDURE:  Trigger point injection in the right side of the neck.  SURGEON:  Thyra Breed, M.D.  INTERVAL HISTORY:  The patients occipital neuralgia has essentially resolved. He now has a discrete area of tenderness on the right side of his neck at about C3 or 4, in the region of the facet joints.  PHYSICAL EXAMINATION:  Blood pressure 147/79, heart rate 90, respiratory rate 12, and O2 saturations 95%.  Pain level is 4 out of 10.  He has intact range of motion of the neck with no tenderness over the greater or lesser occipital grooves.  Neurologic exam and the upper extremities and neck is unchanged.  DESCRIPTION OF PROCEDURE:  The area of tenderness on the right side of the neck was prepped with Betadine x 3, and injected with 1 cc of a mixture of 1% lidocaine to 0.5% levobupivacaine in a 1:1 ratio with approximately 8 mg of Medrol.  The needle was removed intact.  POSTPROCEDURE CONDITION:  Stable.  DISPOSITION: 1. Resume previous diet. 2. Limitation and activities per instruction sheet as outlined by assistant    today. 3. Continue current medications. 4. Followup with me in 3-6 months if his pain recurs. DD:  02/20/00 TD:  02/20/00 Job: [id number]

## 2010-09-16 NOTE — Procedures (Signed)
[facility]  Patient:    Michael [name], Michael [name]                        MRN: [id number] Proc. Date: 08/10/00 Adm. Date:  [id number] Attending:  Thyra Breed CC:         Weyman Pedro, M.D.  Stefani Dama, M.D.   Procedure Report  PROCEDURE:  Bilateral occipital nerve block.  DIAGNOSIS:  Cervical spondylosis with neck pain.  INTERVAL HISTORY:  The patient noted marked improvement after his last series of injection only to have the pain reoccur when he had to do more plowing on the tractor. The constant twisting and rotation of his neck increases his neck discomfort significantly. Its bilateral but right is greater than left. He notes that nothing seems to decrease his pain. He has no associated numbness and tingling, weakness, or bowel or bladder incontinence.  The patients had a bout of shingles and has neuralgia over his anterior chest wall for which he was treated by Dr. Eliberto Ivory but does not recall the medications except that was some OxyCodone and Neurontin and the Neurontin was not helpful and resulted in CNS side effects. He states he still has some discomfort from this.  CURRENT MEDICATIONS:  Levothroid, Serevent, Beconase and multivitamin.  ALLERGIES:  penicillin.  PHYSICAL EXAMINATION:  Blood pressure is 147/93, heart rate is 68, respiratory rate 18, O2 saturations 97%, and pain level is 6/10. The patient demonstrates crepitus on range of motion of his neck but fairly intact range of motion. Deep tendon reflexes were symmetric in the upper extremities. Motor was grossly intact. Sensory showed no focal neuro deficits of the upper extremities.  DESCRIPTION OF PROCEDURE:  After informed consent was obtained, the patient had the greater and lesser occipital grooves bilaterally marked and prepped with alcohol. I injected each site with 1.5 cc of a mixture of 1% lidocaine-- 5 ml mixed with 0.5% levobupivacaine 5 ml with a 0.5 cc of Medrol  containing 20 mg. The needle was removed intact. The patient noted occipital anesthesia over the posterior aspect of his scalp bilaterally. His pain was reduced.  CONDITION POST PROCEDURE:  Stable.  DISPOSITION: 1. Resume previous diet. 2. Limitations in activities per instruction sheet as outlined by my assistant    today. His wife was his driver today. 3. Continue on current medications. We discussed starting some nonsteroidals    but he has had some dyspepsia with vioxx and does not feel like Celebrex    helped him in the past. 4. He plans to bring copies of medications used for his post herpetic    neuralgia and we will address whether something like a tricyclic    antidepressant may be of some benefit in reducing some of his discomfort. 5. Followup with me in one week. DD:  08/20/00 TD:  08/20/00 Job: [id number]

## 2010-09-16 NOTE — Assessment & Plan Note (Signed)
[facility]                          [facility] OFFICE NOTE   Michael [name], Michael [name]                      MRN:          [id number]  DATE:05/31/2006                            DOB:          1931/09/04    REFERRING PHYSICIAN:  Weyman Pedro   HISTORY OF PRESENT ILLNESS:  The patient is a 77 year old male with a  history of known coronary artery disease.  He was recently hospitalized  January 27, 2007 with chest pain.  The patient ruled out for  myocardial infarction.  An outpatient Cardiolite study was requested.  The perfusion study was within normal limits.  No ischemia was noted and  ejection fraction was 60%.   The patient presents now for followup in the office.  He reports no  recurrent substernal chest pain.  He has no shortness of breath,  orthopnea, PND.  Unfortunately, he continues to smoke.   MEDICATIONS:  Levoxyl.  Plavix.  Lisinopril.  Protonix.  Aspirin.  Lipitor.  Multivitamin.  Trazodone.  Advair.  Combivent.  Lyrica.  Flomax.   PHYSICAL EXAMINATION:  VITAL SIGNS:  Blood pressure 140/70, heart rate  68 beats per minute.  GENERAL:  Well-nourished white male in no apparent distress.  HEENT:  Pupils and eyes are clear.  Conjunctivae clear.  NECK:  Supple.  Normal carotid upstroke.  No carotid bruits.  LUNGS:  Clear breath sounds bilaterally.  HEART:  Regular rate and rhythm.  Normal S1, S2.  No murmurs, rubs, or  gallops.  ABDOMEN:  Soft.  EXTREMITIES:  No cyanosis, clubbing, or edema.   PROBLEM LIST:  1. Coronary artery disease.      a.     Negative Cardiolite stress study.      b.     Catheterization February 21, 2006 showed nonobstructive       disease.  2. Dyslipidemia.  3. Chronic obstructive pulmonary disease.  4. Tobacco use.  5. Hypothyroidism on treatment.  6. Mild anemia.  7. Asymptomatic bradycardia.  8. Prostate cancer.   PLAN:  1. The patient's chest pain has essentially resolved.  2. Cardiolite study was  negative for ischemia and appears to be low      risk.  3. The patient to continue on his current medical regimen and I      counseled him again regarding his tobacco use.     Learta Codding, MD,FACC  Electronically Signed    GED/MedQ  DD: 06/03/2006  DT: 06/03/2006  Job #: [id number]   cc:   Weyman Pedro

## 2010-09-16 NOTE — H&P (Signed)
Michael [name], Michael [name] NO.:  [id number]   MEDICAL RECORD NO.:  [id number]                   PATIENT TYPE:  INP   LOCATION:  2001                                 FACILITY:  [facility]   PHYSICIAN:  Cecil Cranker, M.D. [facility]         DATE OF BIRTH:  Feb 25, 1932   DATE OF ADMISSION:  12/17/2001  DATE OF DISCHARGE:                                HISTORY & PHYSICAL   HISTORY OF PRESENT ILLNESS:  The patient is a 77 year old white married male  who was recently admitted to [facility] for chest pain and  transferred here for cardiac evaluation.  He had a Cardiolite scan from  February 2003 revealing a mild inferior defect which was equivocal.  He  underwent coronary angiography by Dr. Riley Kill who found two-vessel disease  with an ejection fraction of 53%.  The left main was normal.  The LAD had a  70 to 75% stenosis in the mid vessel.  The __________ circumflex was normal.  The RCA was a large dominant vessel with a moderate amount of calcium and  there was a lesion of 70 to 80% proximally.  There were normal right heart  pressures.  Dr. Riley Kill performed PTCA and stent with a Zeta stent to the  RCA and continued medical therapy.  The patient now returns with a 24-hour  history of recurrent chest pain similar to that which he had prior to his  initial study.  He describes this as a squeezing-type pain with some  shortness of breath and nausea.   MEDICATIONS:  1. Prevacid 40 mg.  2. Plavix 675 mg.  3. Aspirin 325.  4. Nitroglycerin.  5. Levothroid.  6. Trazodone.  7. Zoloft 50 mg daily.   PAST MEDICAL HISTORY:  1. Coronary artery disease.  2. Chronic obstructive pulmonary disease and unfortunately he continues to     smoke.  3. Arthritis.  4. History of shingles.   SOCIAL HISTORY:  He is married and lives in [place].  He has five  children.  No alcohol. He worked as a Psychologist, occupational.   FAMILY HISTORY:  Father died of a motor vehicle accident.   Mother died at  3.  Three brothers have heart disease.   REVIEW OF SYSTEMS:  Unchanged.   EKG:  Sinus bradycardia, poor R wave progression in inferior leads.   BMET and CBC are unremarkable.  Enzymes are negative.   IMPRESSION:  1. Known coronary artery disease with chest pain suggesting unstable angina     with an ejection fraction of 53%.  2. Chronic obstructive pulmonary disease.  3. Hypothyroidism.  4. Hyperlipidemia.   PLAN:  Intravenous heparin, intravenous nitroglycerin and plan  catheterization tomorrow.  EKG now is completely normal.   Since this could represent restenosis of his stent or progression of the LAD  which was not intervened, we plan to schedule him for a coronary angiography  on December 18, 2001.  Cecil Cranker, M.D. [facility]    EJL/MEDQ  D:  12/17/2001  T:  12/17/2001  Job:  [id number]   cc:   Jonelle Sidle, M.D. [facility]   Haig Prophet

## 2010-09-16 NOTE — Cardiovascular Report (Signed)
NAMEGALAN, GHEE                         ACCOUNT NO.:  [id number]   MEDICAL RECORD NO.:  [id number]                   PATIENT TYPE:  INP   LOCATION:  6526                                 FACILITY:  [facility]   PHYSICIAN:  Salvadore Farber, M.D.             DATE OF BIRTH:  1931/11/11   DATE OF PROCEDURE:  12/16/2002  DATE OF DISCHARGE:                              CARDIAC CATHETERIZATION   PROCEDURE:  Left heart catheterization, left ventriculography, coronary  angiography.   INDICATIONS:  Mr. Fees is a 77 year old gentleman with known coronary  disease, status post stenting of the RCA in August 2003.  He also has a 70-  75% stenosis of the mid LAD that was not physiologically significant based  on FFR measurement in August 2003.  He now presents with unstable angina,  having had the abrupt onset of severe substernal chest pain associated with  profound diaphoresis on the night prior to presentation.  He then awoke with  recurrent chest discomfort during that same night and presented this  morning.  Initial cardiac enzymes were normal, as was electrocardiogram.  He  was referred for urgent angiography.   PROCEDURAL TECHNIQUE:  Informed consent was obtained.  Under 1% lidocaine  local anesthesia, a 6-French sheath was placed in the right femoral artery  using the modified Seldinger technique.  Diagnostic angiography and  ventriculography were performed JL4, JR4, and pigtail catheters.  The case  then proceeded to intervention.   Anticoagulation was initiated eptifibatide and heparin to achieve an ACT of  greater than 200 seconds. Plavix 300 mg was administered orally.  A 6-French  Kiesz right guide was advanced over a wire and engaged in the ostium of the  right coronary.  A Luge wire was advanced over the wire and positioned in  the distal RCA without difficulty.  Attempt was then made to perform IVUS.  However, due to extreme tortuosity of the proximal vessel, I was unable  to  pass the IVUS catheter to the lesion.  It was, therefore, removed.  During  removal, the guiding wire became entangled with the IVUS catheter.  Both  were, therefore, removed via the guide.  The lesion was then again crossed  with a second Luge wire.  The lesion was then predilated using a 2.75 x 8 mm  Quantum at 10 atm.  The lesion yielded readily.  It was then stented using a  2.75 x 13 mm CYPHER at 16 atm.  Final angiograms demonstrated no residual  stenosis and TIMI-3 flow to the distal vessel.  The patient tolerated the  procedure well and was transferred to the holding room in stable condition.   COMPLICATIONS:  None.   FINDINGS:  1. LV:  130/6/13.  EF 72%, without regional wall motion abnormality.  2. Left main:  Angiographically normal.  3. LAD:  Moderate-sized vessel giving rise to a single large diagonal  branch.  There is a 75% stenosis of the mid LAD which is unchanged     compared with the prior films.  4. Circumflex:  Small vessel which is angiographically normal.  5. RCA:  Large, dominant vessel which has diffuse but mild disease     throughout its course.  The previously placed stent in the mid RCA is     widely patent.  Just distal to this stent there is a new very focal 80%     stenosis.  This was successfully stented using drug-eluding stent with no     residual stenosis.    IMPRESSION/RECOMMENDATIONS:  Successful drug-eluding stent placement in the  thrombotic lesion at the mid right coronary artery.  Recommend Plavix for 12  months given his acute presentation.  Aspirin should be continued  indefinitely.  Eptifibatide will be continued for 18 hours.  Beta blocker is  currently contraindicated due to relative bradycardia.                                               Salvadore Farber, M.D.    WED/MEDQ  D:  12/16/2002  T:  12/17/2002  Job:  [id number]   cc:   Jonelle Sidle, M.D. [facility]

## 2010-09-16 NOTE — Cardiovascular Report (Signed)
NAME:  Michael [name], Michael [name] NO.:  [id number]   MEDICAL RECORD NO.:  [id number]                    PATIENT TYPE:   LOCATION:                                       FACILITY:   PHYSICIAN:  Salvadore Farber, MD [facility]            DATE OF BIRTH:   DATE OF PROCEDURE:  DATE OF DISCHARGE:                              CARDIAC CATHETERIZATION   PROCEDURE:  Coronary angiography, pressure wire measurement of the  LAD.   INDICATIONS:  The patient is a  77 year old gentleman with coronary disease.  On December 03, 2001, he underwent placement of a stent in his mid right  coronary artery by Dr. Riley Kill. He now presents with rest chest pain with  negative enzymes and an unchanged electrocardiogram. He is referred for  diagnostic angiography.   DIAGNOSTIC TECHNIQUE:  Informed consent was obtained. Under 3% lidocaine  local anesthesia, the 6 French sheath was placed in the right femoral artery  using the modified Seldinger technique. Angiography was performed with a 6  [place] JR-4 catheter  and a 6 [place] CLS guiding catheter. Angiography  was  performed by hand injection. Images revealed an unchanged moderate stenosis  of the  mid LAD. I therefore proceeded to evaluate this with a flow wire.   I administered 15 units/kg of heparin. The ACT was confirmed to be greater  than 225 seconds. The flow wire was zeroed outside of the  body  and then  normalized to guide tip pressure in the left main coronary artery. The wire  was then advanced to the LAD just distal to the lesion. Baseline pressure  ratio was 0.97.  Then 40 mcg of intracoronary adenosine administered. The  pressure ratio nadired at 0.92. The wire was then removed. Final angiograms  demonstrated no change in the stenosis.   FINDINGS:  1. Left main:  Angiographically normal.   1. LAD:  The LAD is a large vessel which gave rise to one small and one very     large diagonal branch. Just distal to the large second  diagonal branch,     there is a 60% to 70% smooth stenosis which is unchanged from December 03, 2001.   1. Circumflex:  The circumflex is small and gave rise to a single small     obtuse marginal branch.   1. RCA. The RCA is a large vessel, supplying the PDA and a very large     posterolateral branch. The mid RCA stent is widely patent with TIMI 3     flow.   1. Flow wire findings:  Baseline pressure ratio 0.97, hyperemic pressure     ratio 0.92.    IMPRESSION/PLAN:  The patient has a widely patent RCA stent.  The moderate  LAD stenosis is unchanged from previous. A flow wire demonstrates it to be  hemodynamically insignificant. Plan to discharge him to home in  the morning  and evaluate for a noncardiac source of chest pain.                                                 Salvadore Farber, MD [facility]    WED/MEDQ  D:  12/18/2001  T:  12/21/2001  Job:  [id number]

## 2010-09-16 NOTE — Discharge Summary (Signed)
[name], BALLOW NO.:  [id number]   MEDICAL RECORD NO.:  [id number]          PATIENT TYPE:  INP   LOCATION:  6524                         FACILITY[facility]  [facility]   PHYSICIAN:  Carole Binning, M.D. LHCDATE OF BIRTH:  07-09-1931   DATE OF ADMISSION:  10/12/2004  DATE OF DISCHARGE:  10/13/2004                                 DISCHARGE SUMMARY   SUMMARY OF HISTORY:  [name] is a 77 year old male who is followed by Dr.  Diona Browner in the [facility] office with known coronary artery disease.  Over the  preceding month or two, the patient has had and been treated for some type  of pulmonary illness by his primary care physician, which has included  antibiotics and tapering steroids.  However, on the day of admission he  developed left anterior chest discomfort that was similar to his original  chest discomfort with his cardiac history.  He took a nitroglycerin and felt  better.  On transport to [facility], he did not have any acute EKG changes.   His history is notable for coronary artery disease.  His last intervention  was in October.  He had recurring discomfort with a re-look catheterization  in October.  However, this site looked good.  He did have a residual 70% LAD  and a follow-up stress test in [place] in January, and this was okay.  His medical history is also notable for COPD, hyperlipidemia, continued  tobacco use, anxiety, insomnia, status post cholecystectomy, GERD.   PENICILLIN allergy.   LABORATORY DATA:  H&H of 10.8 and 32.3, normal indices, platelets 337, WBC  12.5.  PT 12.9, PTT 26.  Sodium 129, potassium 4.7, BUN 16, creatinine 1.2,  glucose 115.  Normal LFTs.  CK, MBs and troponins x2 were negative for  myocardial infarction.  TSH was 0.602.  EKG showed sinus bradycardia with a  rate of 43.  EKG showed COPD, minimal left basilar atelectasis.   HOSPITAL COURSE:  [name] was admitted to [facility] by Dr. Myrtis Ser  for further evaluation.  He  was continued on his home medications and placed  on IV heparin.  On October 13, 2004, he underwent cardiac catheterization by  Dr. Gerri Spore.  Ejection fraction was normal, and he had a 60-70% mid-LAD.  The stents in the RCA proximally and distally were patent; however, the  proximal stent did show a 50% restenosis.  Recommended continued medical  treatment.  Post catheterization he was ambulating without difficulty and  catheterization site was intact.  It was felt that he could be discharged  home.   DISCHARGE DIAGNOSES:  1.  Chest discomfort of undetermined etiology.  2.  Coronary artery disease as previously described.  3.  Recent pulmonary illness of uncertain etiology.  4.  Sinus bradycardia.  5.  Normocytic anemia.  6.  History as previously.   DISPOSITION:  He is asked to maintain a low salt, fat and cholesterol diet.  He was discharged per catheterization instructions discharge sheet.  His  medications remain the same.  These include:   1.  Advair b.i.d.  2.  Combivent two puffs four times a day.  3.  Trazodone 50 mg q.h.s. p.r.n.  4.  Synthroid 0.125 mg daily.  5.  Plavix 75 mg daily.  6.  Aspirin 325 mg daily.  7.  Protonix 40 mg daily.  8.  Multivitamin daily.  9.  Lisinopril 10 mg daily.  10. Nitroglycerin 0.4 mg p.r.n.  11. He was asked to finish his prednisone taper per Dr. Raul Del.  12. Lipitor 40 mg q.h.s.   He was asked to call Dr. Ival Bible office the following morning to arrange  a two-week appointment.      Joellyn Rued, P.A. [facility]      Carole Binning, M.D. [facility]     EW/MEDQ  D:  01/10/2005  T:  01/10/2005  Job:  [id number]   cc:   Wyline Copas, M.D. [facility]  [address]  [place]  [place] [address]

## 2010-09-16 NOTE — Discharge Summary (Signed)
Michael [name], [name] NO.:  [id number]   MEDICAL RECORD NO.:  [id number]                   PATIENT TYPE:  INP   LOCATION:  6522                                 FACILITY:  [facility]   PHYSICIAN:  Joellyn Rued                        DATE OF BIRTH:  1932/03/04   DATE OF ADMISSION:  11/29/2001  DATE OF DISCHARGE:  12/04/2001                           DISCHARGE SUMMARY - REFERRING   SUMMARY AND HISTORY:  [name] is a 77 year old white male who was  admitted to [facility] by Dr. Eliberto Ivory.  We were asked to see in  consultation on November 29, 2001.  He gave a past medical history of fairly  progressive fatigue, dyspnea on exertion, and intermittent chest tightness  with radiation to both arms over the last few months.  The symptoms would  occur with activity, but more recently have occurred at rest, specifically,  on the morning of admission after he got up to go to the bathroom.  The  patient was not clear on whether there were specific external triggers for  his symptoms.  He does state that at times he was able to walk around  without problems.  A prior exercise Cardiolite in February of 2003 did not  show any EKG changes, did show a mild inferior wall defect, and was felt to  be equivocal on review by Dr. Bethann Punches.  His EF was 55%, and Dr. Eliberto Ivory has  proceeded with a pulmonary workup, including pulmonary function tests, which  were reported to be mildly abnormal.   PAST MEDICAL HISTORY:  His past medical history is notable for some  arthritis, recent stress secondary to the recent death of his 2 brothers,  hypothyroidism, possible COPD, history of asbestos exposure, allergic  rhinitis, shingles with post-therapeutic neuralgia, intolerant to Neurontin,  hypothyroidism, status post thyroidectomy, and tobacco use.   LABORATORY DATA:  At [facility], H&H was 14.8 and 43.0, normal  indices.  Platelets 299.  WBCs 8.9.  PT 12.2.  PTT 30.1.  Sodium 134,  potassium 4.1, BUN 14, creatinine 1.4, glucose 102.  Admission total CK at  [facility] was 70, with MB of 0.8 and troponin of 0.01.  Second CK total was  58, MB 0.6, troponin less than 0.01.  TSH was 4.24.  On transfer from  [facility], serial 3 CK-MBs were negative for  myocardial infarction.  Fasting lipids showed a total cholesterol 194,  triglycerides 153, HDL 44, LDL 119.  On the morning of discharge, H&H was  12.5 and 37.1, normal indices.  Platelets were 233.  WBCs 7.3.  Post-  procedure CK-MB were within normal limits.  Initially, sodium was 135,  potassium 4.2, BUN 11, creatinine 1.3.  Chest x-ray did not show any active  disease.  EKG showed sinus bradycardia, first degree AV block, nonspecific  ST-T wave  changes.   HOSPITAL COURSE:  [name] was transferred to [facility] facility in  anticipation of cardiac catheterization.  Overnight, he did not have any  further chest discomfort of shortness of breath.  Repeat enzymes were  negative for myocardial infarction.  On December 02, 2001, right and left heart  catheterization was performed by Dr. Riley Kill.  According to his progress  note, he had a 70-80% proximal mid calcified RCA, 70-75% mid LAD.  EF of  63%.  AV were 123/6, aorta 132/63, PA 15/7, RV 16/3, RA 2, mean PA recovery  was 6, cardiac output thermo was 3.86, 3.7.  Sats showed a 59% in the PA,  59% in the FVC, and 94% in the aorta.  Dr. Riley Kill spent approximately 1  hour discussing results and possible options in regard to his coronary  artery disease.  Dr. Riley Kill favored angioplasty and possible stenting of  the RCA, but he felt that the drug-eluding stent may not be available due to  the bend in the RCA and the proximal tortuosity.  Dr. Diona Browner reviewed the  films with Dr. Riley Kill, and he also felt that the RCA stenosis was clearly  more significant than the LAV.  Cardiolite from February 2003 did show a  mild inferior defect that was somewhat  equivocal.  No anterior defect.  Thus, he agreed with the tent intervention on the RCA.  On August 5, Dr.  Riley Kill performed angioplasty stenting to the RCA lesion, reducing it to  less than 10% without difficulty.  Thus, he was moved on to bed rest.  He  was able to ambulate in the hall without difficulty.  Dr. Eden Emms and Dr.  Riley Kill saw him on the morning of the 6th and felt that he could be  discharged home.    DISCHARGE DIAGNOSES:  1. Unstable angina status post angioplasty stenting of the RCA as previously     described.  2. Residual coronary artery disease in the LAV distribution.  3. Hyperlipidemia.   DISPOSITION:  He is discharged home.  He was given new prescription for  Pravachol 40 mg q. h.s., Plavix 75 mg q.d. for 3 months, and asked to  increase his aspirin to 325 q.d., and a new prescription for nitroglycerin  0.4 as needed.  He was asked to continue his Combivent inhaler 2 puffs  b.i.d., Levothroid 0.1 q.d., Advair Diskus 100/50 1 puff b.i.d., trazodone  50 to 100 mg at bedtime as needed, Zoloft 50 mg q.d., and to continue his  antibiotic for another 6 days in regards to his prostate.  He was advised no  lifting, driving, sexual activity, or heavy exertion for 2 days, maintain  low-salt/fat/cholesterol diet.  If he has any problems with his  catheterization site, he was asked to call our office immediately.  He was  advised no smoking or tobacco products.  He will see Dr. Grayling Congress. in  the [place] office on Wednesday, August 20, at 3 p.m.  In approximately 6  weeks, he will need fasting lipids and LFTs to re-evaluate the  administration of Pravachol.  He will follow up with Dr. Rito Ehrlich and Dr.  Eliberto Ivory as well.  Dr. Riley Kill notes that he should be on antibiotics for 2  months if he has any dental or medical procedures.  Followup Cardiolite scan  in approximately 6 months to assess the LAV.  Just prior to discharge, [name] received instructions cardiac rehab,  dietary, and information about.  His new prescriptions.  Joellyn Rued    EW/MEDQ  D:  12/04/2001  T:  12/08/2001  Job:  [id number]   cc:   Dennie Maizes, M.D.   [facility]  [address]  [address]  [place], [place] [address]   Dr. Eliberto Ivory

## 2010-09-16 NOTE — Cardiovascular Report (Signed)
Michael [name], FISCHEL NO.:  [id number]   MEDICAL RECORD NO.:  [id number]                   PATIENT TYPE:  INP   LOCATION:  6522                                 FACILITY:  [facility]   PHYSICIAN:  [name], M.D. [facility]         DATE OF BIRTH:  15-Feb-1932   DATE OF PROCEDURE:  12/02/2001  DATE OF DISCHARGE:  12/04/2001                              CARDIAC CATHETERIZATION   INDICATIONS:  The patient is a very delightful gentleman who has had some  shortness of breath. This has been evaluated with pulmonary function  studies, as well as radionuclide imaging. A Cardiolite study suggested the  possibility of some inferior ischemia. The patient had a segmental lesion of  the right coronary artery as well as a moderate lesion of the mid left  anterior descending artery, and various options were discussed with the  patient in detail. Dr. Diona Browner and I then extensively reviewed the films,  and Dr. Diona Browner spoke with the patient in detail as well. Risks, benefits  and alternatives were discussed in detail with the patient and his family.   PROCEDURES:  Percutaneous stenting of the right coronary artery.   DESCRIPTION OF PROCEDURE:  Following informed consent, the patient was  brought to the catheterization lab and prepped and draped  in the usual fashion. Through anterior puncture, the left femoral artery was  easily entered. The patient had received preprocedural Plavix.  Following  access with the sheath, heparin and Integrilin were given according to  protocol. Versed was given for relaxation. The ACT was checked and was 237.  Prior to Integrilin bolus a right 3.5 mm shepherd's crook guiding catheter  was utilized to intubate the right artery. We were unable to use a luge wire  and replaced this with a BMW wire. Using the [facility] wire, we were able to cross  the lesion. By lifting up on the guide, we were able to deliver a 2.75 x 28  mm length Zeta stent to  the treatment site. We elected not to use a Cordis  Cypher stent largely because of a steep bend in the distal portion of the  lesion. Because of this, we then crossed with the stent, and this was taken  up to 14 atmospheres. The lesion was then post-dilated using a 3.0 x 8  PowerSail balloon up to 16 atmospheres.  Because of the distal portion of  the lesion being larger, we then passed a 3.5 mm x 8 PowerSail balloon and  this was taken up to as high as 11 atmospheres throughout the course of the  stent. There was marked improvement in the appearance of the artery. All  catheters were subsequently removed and the femoral sheath sewn into place.  The patient was taken to the holding area in satisfactory clinical  condition.   ANGIOGRAPHIC DATA:  The right coronary artery is as previously noted in the diagnostic study.  There  is a shepherd's crook takeoff with a steep bend. There is moderate  calcification throughout the vessel. There is a long 70-80%  lesion leading into a steep bend just after the proximal portion of the mid  vessel.  Following stenting, this is reduced to less than 10% residual  luminal narrowing with a smooth transition into the bend with what appears  to be full apposition of the stent.  Distal runoff is excellent and the  distal vessel was unchanged from the diagnostic study.   CONCLUSIONS:  Successful percutaneous stenting of the right coronary artery.   DISPOSITION:  The patient will follow up in [place]. He will need aspirin and  Plavix.  As noted, we elected not to use a drug-eluting stent partially  because of delivery, but more importantly because of the steep bend in the  distal portion of the lesion. The final angiographic result was excellent.  The patient did not have known ischemia in the  LAD territory, but ultimately functional testing will be necessary to  reassess this. Risk factor reduction and discontinuation of smoking would  also be recommended.  Followup will be with Dr. Eliberto Ivory and Dr. Diona Browner. I  have notified both physicians of the outcome.                                                    [name], M.D. [facility]    TDS/MEDQ  D:  12/07/2001  T:  12/12/2001  Job:  [id number]   cc:   Jonelle Sidle, M.D. [facility]   Winona Legato, M.D.  [place]   [facility]

## 2010-09-16 NOTE — Procedures (Signed)
[facility]  Patient:    Michael [name], Michael [name]                        MRN: [id number] Proc. Date: 02/06/00 Adm. Date:  [id number] Attending:  Thyra Breed CC:         Weyman Pedro, M.D.  Stefani Dama, M.D.   Procedure Report  PREOPERATIVE DIAGNOSIS:  Neck and occipital discomfort.  POSTOPERATIVE DIAGNOSIS:  Neck and occipital discomfort.  PROCEDURE:  Right occipital nerve block.  SURGEON:  Thyra Breed, M.D.  INTERVAL HISTORY:  The patient is a very pleasant 77 year old gentleman who was sent to [place] by Dr. Barnett Abu for trial of occipital nerve blocks.  The patient states that he was in his usual state of health up until approximately 18 months ago when he developed the gradual onset of neck discomfort radiating up into the right occipital region.  He was seen by Dr. Eliberto Ivory, his primary care physician, and an MRI was performed at [facility] back in December 24, 1998.  This showed cervical spondylosis with neural canal impingement at C4-5 level and C5-6.  He was sent to Dr. Barnett Abu who I initially saw back in October of 2000.  He was treated conservatively with Vioxx, chiropractic, and physical therapy with no sustained improvement.  He presents today with what he describes as a discomfort in his right occipital and neck region which is made worse by turning his head from side-to-side, and he is still looking for something that makes him feel better.  He denied any numbness or tingling or bowel or bladder incontinence or weakness from it.  He has stopped the Vioxx and has not noted appreciable deterioration of his neck discomfort.  He rates his pain at 6 out of 10.  CURRENT MEDICATIONS:  Levothroid, vitamin E, multivitamins, Allegra, and Beconase.  ALLERGIES:  PENICILLIN CHARACTERIZED BY RASHES.  FAMILY HISTORY:  Positive for coronary artery disease and cancer.  PAST SURGICAL HISTORY:  Significant for arthroscopy of his knee,  gallbladder surgery, and turbinectomies of his nose.  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  He smokes a half-pack-per-day of cigarettes and does not drink alcohol.  ACTIVE MEDICAL PROBLEMS:  Include questionable history of asbestos which he is currently being evaluated for, hypothyroidism on replacement, nasal stuffiness, breathing problems related to his allergies.  REVIEW OF SYSTEMS:  General negative.  Head significant for pain on the back of his head which he describes as a vague discomfort, more neck pain than anything else.  Eyes negative.  Nose, mouth, and throat is significant for nasal stuffiness.  Ears negative.  Lungs significant for cough on occasion. Cardiovascular negative.  GI negative.  GU negative.  Musculoskeletal significant for knee discomfort and neck discomfort.  Neurologic - see HPI for pertinent positives.  No history of seizure or stroke.  Cutaneous negative. Hematologic negative.  Endocrine - thyroid disregulation with hypothyroidism for 15 years.  No history of diabetes.  Psychiatric negative.  Allergy and immunologic positive for hay fever.  PHYSICAL EXAMINATION:  Blood pressure is 162/72, heart rate is 53, respiratory rate if 14, O2 saturations are 96%.  Temperature is 97.4.  Pain level is 6 out of 10.  In general, this is a pleasant male in no acute distress.  The head was normocephalic and atraumatic.  Eyes - extraocular movements intact with conjunctivae  and sclerae clear.  Nose patent.  Nares clear.  The oropharynx is free of lesions with upper dental plate.  Neck demonstrated modest restriction and range of motion of the neck with tenderness over the right occipital and right neck regions.  Carotids are 2+ and symmetric without bruits.  Lungs are clear.  Heart was regular rate and rhythm.  Extremities - no cyanosis, clubbing, or edema.  Radial pulses and  dorsalis pedis pulses 2+ and symmetric. Back exam revealed no tenderness to percussion over the vertebrae with negative straight leg raise signs.  Genitalia, rectal, and abdominal exams were not performed.  Neurologic - the patient was oriented x 4.  Cranial nerves 2-12 are grossly intact.  Deep tendon reflexes are symmetric in the upper and lower extremities with downgoing toes.  The motor was 5/5 with symmetric bulk and tone.  Coordination was grossly intact.  Sensory exam was intact to pinprick, light touch, and vibratory sense.  Coordination was grossly intact.  He had no clonus noted.  IMPRESSION: 1. Neck pain with occipital neuralgia on the basis of a cervical spondylosis. 2. Allergic rhinitis and cough related to his allergies. 3. Hypothyroidism on replacement. 4. Osteoarthritis involving his knee as well as his neck.  DISPOSITION:  I discussed the potential risks, benefits, and limitations of an occipital nerve block in detail.  He wishes to proceed.  DESCRIPTION OF PROCEDURE:  After informed consent was obtained, the patient was placed in the sitting position and monitored.  His neck was prepped with Betadine x 3 over the occipital groove on the right side.  Using a 25-gauge needle, I injected 2 cc of 1% lidocaine.  The patient noted decreased sensation over the occipital nerve distribution on the right side, relative to the left and a continuation of his neck discomfort.  POSTPROCEDURE CONDITION:  Stable.  DISCHARGE INSTRUCTIONS: 1. Resume previous diet. 2. Limitation and activities per instruction sheet. 3. Continue on current medications. 4. Followup with me next week.  In the interim, I have encouraged him to use    apple cider vinegar with honey to see if this will reduce some of his    discomfort. DD:  02/06/00 TD:  02/07/00 Job: [id number]

## 2010-09-16 NOTE — H&P (Signed)
Michael [name], Michael [name]                         ACCOUNT NO.:  [id number]   MEDICAL RECORD NO.:  [id number]                   PATIENT TYPE:  INP   LOCATION:  6526                                 FACILITY:  [facility]   PHYSICIAN:  Vida Roller, M.D.                DATE OF BIRTH:  Jan 18, 1932   DATE OF ADMISSION:  12/16/2002  DATE OF DISCHARGE:  12/17/2002                                 DISCHARGE SUMMARY   HISTORY OF PRESENT ILLNESS:  The patient is a 77 year old white male who  presented to [facility] with recurrent chest discomfort.  He claims that on  the evening prior to admission, he abruptly broke out in sweats.  He went to  bed and was awakened at 5 a.m. with substernal left upper chest discomfort  that radiated to his left neck, jaw, and down his arm and into his hand. His  fingers felt tingly. His jaw felt numb. He tried three sublingual  nitroglycerin without relief and presented to the emergency room where he  received an additional four sublingual nitroglycerin, placed on  nitroglycerin, placed on O2, and received morphine. After that, he was  almost totally pain-free, except for some residual shoulder discomfort.  He  did state that he was cutting his lawn yesterday with a riding lawnmower and  may have twisted something. He also states that he has recently had an  anterior cervical diskectomy in April and was cleared prior to surgery.  Since that surgery, he has had some left neck and left arm paresthesia and  discomfort.  At [facility], initial enzymes and EKG's did not show  any changes.   His history is notable for hypertension, hyperlipidemia, continued tobacco  use, known coronary artery disease with stenting to the right coronary  artery on December 03, 2001.  A recath two weeks after that continued to show  patency with recurrent discomfort.  He did not have any change at that time  in his 30 to 75% LAD which was felt not to be hemodynamically significant.  For  recurrent chest discomfort, he has also had an upper GI by Dr. Sharyon Medicus  which showed some anterior gastritis and esophagitis.  A stress test for  recurring discomfort in February of 2004 showed an EF of 64%, no evidence of  ischemia.   LABORATORY DATA:  PT at [facility] was 12.5, PTT 31.7, sodium 138, potassium  4.1, BUN 12, creatinine 1.2, glucose 116.  H&H 13.9 and 40.6, normal  indices, platelets 312, WBC's 9.9.  CK total, MB, and troponin were within  normal limits at [facility].   At [facility], post procedure CK-MB was negative for myocardial  infarction.  On August 18, post procedure, sodium was 135, potassium 3.1,  BUN 11, creatinine 1.1, glucose 124.  H&H of 11.9 and 34.2, normal indices.  Platelets 242.  WBC 7.2.  Repeat chemistry at noon post supplementation  showed a magnesium of 2.0 and potassium of 4.5.   EKG from [facility] showed normal sinus rhythm, first degree AV block, left  axis deviation, left anterior fascicular block. Subsequent EKG's were  essentially the same.   HOSPITAL COURSE:  The patient was transferred to [facility] for  cardiac catheterization. This was performed on December 16, 2002, by Dr.  Samule Ohm. According to Dr. Melinda Crutch note, his EF was 72% without wall motion  abnormalities. He has 75% mid LAD stent to the RCA was patent, however, he  did have an 80% lesion post stent in the mid RCA.  Dr. Samule Ohm utilized a new  Cypher stent and reduced this lesion from 80% to 0% without difficulty.  He  recommended Plavix for at least 12 months and aspirin indefinitely.  Post  sheath removal and bed rest, the patient was ambulating without difficulty.  Catheterization site was intact. On August 18, his labs showed him to be  hypokalemic. He was supplemented.  Dr. Dorethea Clan felt that if his potassium was  within normal limits at noon, he could be discharged home.  Further check on  his potassium was within normal limits and he was discharged home.  Cardiac  rehab saw the patient prior to discharge for education and ambulation.    DISCHARGE DIAGNOSES:  1. Unstable angina.  2. Progressive coronary artery disease, status post angioplasty stenting of     the right coronary artery as previously described.  3. Continue tobacco use.  4. History as previously.   DISCHARGE MEDICATIONS:  He received a new prescription for Plavix 75 mg  daily for one year as well as sublingual nitroglycerin 0.4 as needed.  He  was asked to increase his aspirin to 325 mg daily.  He will continue his  Advair 100/50 one puff b.i.d., Trazodone 50 q.h.s., Pravachol 80 q.h.s.,  however at the time of review of medications, the patient states that Dr.  Raul Del changed him to Lipitor of unknown dosage.  Levothroid 0.1 mg daily  and Nexium 40 mg daily.   He was advised no lifting, driving, sexual activity, or heavy exertion for  two days. Maintain low salt, fat, and cholesterol diet.  If he has any  problems with his catheterization site, he is asked to call immediately. He  was advised no smoking or tobacco products. He will see Jonelle Sidle,  M.D. [facility] in the [facility] on September 8, at 11:30 a.m.   The patient was asked to bring him all of his medications for review with  Dr. Diona Browner.  Dr. Diona Browner will clarify if he should continue his Lipitor  or be maintained on Pravachol.      Joellyn Rued, P.A. [facility]                    Vida Roller, M.D.    EW/MEDQ  D:  12/17/2002  T:  12/17/2002  Job:  [id number]   cc:   Elvin So, M.D., [facility]

## 2010-09-16 NOTE — Cardiovascular Report (Signed)
NAMEKODEY, XUE               ACCOUNT NO.:  [id number]   MEDICAL RECORD NO.:  [id number]          PATIENT TYPE:  AMB   LOCATION:  SDS                          FACILITY:  [facility]   PHYSICIAN:  Veverly Fells. Excell Seltzer, MD  DATE OF BIRTH:  04-Oct-1931   DATE OF PROCEDURE:  DATE OF DISCHARGE:  02/21/2006                              CARDIAC CATHETERIZATION   DATE OF PROCEDURE:  February 21, 2006   DATE OF BIRTH:  1932-02-18   PROCEDURE:  Aortogram, selective superior mesenteric artery angiogram,  selective celiac angiogram, and Star[employer] of the right femoral artery.   INDICATION:  Mr. Fuhs is a very nice 77 year old gentleman who was  referred for abdominal pain and question of mesenteric ischemia.  He has  known coronary artery disease and underwent a CT scan of the abdomen that  demonstrated SMA stenosis that was estimated in the range of 70% to 75%.  He  was subsequently referred for peripheral vascular angiogram and potential  stenting of the SMA for mesenteric ischemia.   PROCEDURAL DETAILS:  Risks and indications of the procedure were explained  in detail to the patient.  Informed consent was obtained.  The right groin  was prepped, draped, and anesthetized with 1% lidocaine under normal sterile  conditions.  Using the modified Seldinger technique, a 5-French arterial  sheath was placed in the right common femoral artery.  A pigtail catheter  was placed in the abdominal aorta just above the celiac axis and an AP and  lateral aortogram was performed.  Following aortography, selective SMA  angiography was performed utilizing a 5-French LIMA catheter to select the  SMA.  These images were taken in multiple angiographic views.  Following SMA  angiography, the LIMA catheter was used to image the celiac artery, also in  multiple views.   FINDINGS:  Aortic pressure was 116/50 with a mean of 73.   The abdominal aorta had mild luminal irregularities in the infrarenal area.  There was  no significant stenoses in the aorta or proximal iliac vessels.   Renal arteries appeared widely patent bilaterally.   The SMA had a 30% tubular proximal stenosis.  It appeared nonobstructive.  The ostium of the SMA was angiographically normal.  There was mild calcium  in the stenotic area of the SMA.   The celiac artery had a focal 50% ostial stenosis.  There was a 15-20 mm  pressure gradient of that vessel.  Otherwise, the celiac artery and its  branches were widely patent.   ASSESSMENT:  1. Moderate celiac stenosis.  2. Mild superior mesenteric artery stenosis.   PLAN:  The patient's SMA and celiac disease do not appear angiographically  severe enough to cause significant mesenteric ischemia.  I would recommend  continued medical therapy for the patient's overall vascular disease.  He  will undergo further evaluation as indicated for his abdominal pain.      Veverly Fells. Excell Seltzer, MD  Electronically Signed     MDC/MEDQ  D:  02/21/2006  T:  02/22/2006  Job:  [id number]   cc:   Weyman Pedro

## 2010-09-16 NOTE — Discharge Summary (Signed)
NAMEJODEN, Michael [name] NO.:  [id number]   MEDICAL RECORD NO.:  [id number]          PATIENT TYPE:  INP   LOCATION:  3709                         FACILITY:  [facility]   PHYSICIAN:  Carole Binning, M.D. LHCDATE OF BIRTH:  1931/08/03   DATE OF ADMISSION:  02/11/2004  DATE OF DISCHARGE:  02/18/2004                           DISCHARGE SUMMARY - REFERRING   DISCHARGE DIAGNOSES:  1.  Coronary artery disease, status post percutaneous coronary intervention      to the right coronary artery.  2.  Subendocardial myocardial infarction with distal embolization.  3.  Hyperlipidemia, treated.  4.  Chronic obstructive pulmonary disease.  5.  History of tobacco abuse.  6.  Allergy to morphine which causes a rash and hypotension.  7.  Allergy to oxycodone which causes rash.   HOSPITAL COURSE:  [name] is a 77 year old male patient who was admitted  to [facility] with recurrent chest pain on February 11, 2004.  He was  consulted by Jonelle Sidle, M.D., and he felt that his pain was  concerning for unstable angina.  He does have known coronary artery disease  and had a stent placed to the right coronary artery in 2002 and in 2004 with  residual 70% LAD disease.   The patient was transferred and underwent cardiac catheterization on February 15, 2004.  He was found to have stenosis in the distal right coronary artery  and this area underwent drug-eluding stent placement resulting in 0% post  procedure.  Distal embolization with occlusion of a small distal PLA did  occur with a mild bump in troponin.  The patient was kept in the hospital  for several days for observation and by February 18, 2004, he was ready for  discharge to home.   DISCHARGE MEDICATIONS:  He was discharged home on the following medications:  1.  Lipitor 40 mg q.h.s.  2.  Combivent.  3.  Nexium.  4.  Advair.  5.  Levothyroxine.  6.  Trazodone.  7.  Plavix 75 mg a day for one year.  8.   Enteric-coated aspirin 325 mg a day.  9.  Sublingual nitroglycerin p.r.n. chest pain.   ACTIVITY:  No straining or heavy lifting for one week.  No lifting over 10  pounds for one week.  No driving for two days.   DIET:  Remain on a low-fat diet.   WOUND CARE:  Clean over catheterization site with soap and water.  No  scrubbing.   SPECIAL INSTRUCTIONS:  No smoker.  Call for questions or concerns at [phone].   FOLLOWUP:  He has a followup appointment with Suszanne Conners. [name], M.D., on March 03, 2004, at 11:30 a.m.       ________________________________________  Guy Franco, P.A. [facility]  ___________________________________________  Carole Binning, M.D. [facility]    LB/MEDQ  D:  02/18/2004  T:  02/18/2004  Job:  [id number]   cc:   Weyman Pedro, M.D.   [facility]  [address]  [place], [place] [address]

## 2010-09-16 NOTE — Op Note (Signed)
Michael [name], Michael [name]                         ACCOUNT NO.:  [id number]   MEDICAL RECORD NO.:  [id number]                   PATIENT TYPE:  INP   LOCATION:  5029                                 FACILITY:  [facility]   PHYSICIAN:  Loreta Ave, M.D.              DATE OF BIRTH:  1931-05-02   DATE OF PROCEDURE:  11/26/2003  DATE OF DISCHARGE:                                 OPERATIVE REPORT   PREOPERATIVE DIAGNOSES:  End-stage degenerative arthritis, medial  compartment, right knee.  Lateral meniscal tear.   POSTOPERATIVE DIAGNOSES:  End-stage degenerative arthritis, medial  compartment, right knee.  Lateral meniscal tear.   OPERATION PERFORMED:  1. Examination under anesthesia, arthroscopy with partial medial and lateral     meniscectomies and assessment of degree of degenerative changes.  2. Medial unicompartmental joint replacement, Osteonics prosthesis.  Euis     prosthesis.  A cemented medium medial femoral component.  A cemented     large medial 8 mm tibial component.  Soft tissue balancing with medial     capsular release.   SURGEON:  Loreta Ave, M.D.   ASSISTANT:  Arlys John D. Petrarca, P.A.-C.   ANESTHESIA:  General.   ESTIMATED BLOOD LOSS:  Minimal.   SPECIMENS:  Excised bone and soft tissue.   TOURNIQUET TIME:  One hour 30 minutes.   CULTURES:  None.   COMPLICATIONS:  None.   DRESSING:  Soft compressive with knee immobilizer.   DRAINS:  Hemovac times one.   DESCRIPTION OF PROCEDURE:  The patient was brought to the operating room and  placed on the operating table in supine position.  After adequate anesthesia  had been obtained, the knee examined.  Varus correctable to neutral from 5  degrees.  Very mild flexion contracture.  Good flexion, good stability.  Tourniquet and leg holder applied.  Leg prepped and draped in the usual  sterile fashion.  Exsanguinated elevation Esmarch.  Tourniquet inflated to  350 mmHg.  Three portals were created, one  superolateral, one each medial  and lateral parapatellar.  Inflow catheter introduced.  Knee distended.  Arthroscope introduced, knee inspected.  Patellofemoral joint had excellent  cartilage, good tracking.  Lateral compartment had a little fraying on the  tibial plateau but otherwise looked excellent.  Marked complex tearing,  entire lateral meniscus saucerized out  leaving about half the meniscus all  the way around at completion.  Cruciate ligaments intact.  Medical  compartment extensive grade 4 changes.  I did remove all of the remaining  medial meniscus in preparation for unicompartmental replacement.  Felt that  the changes in the knee definitely lent themselves to uni-knee versus total  knee replacement.  Instruments and fluid removed. Incision was then made  medial parapatellar from the patella down to the tibial tubercle.  Skin,  subcutaneous tissue and capsule divided.  Medial capsular release.  Knee  inspected.  Remnant of menisci, hypertrophic synovial tissue,  periarticular  spurs removed.  Attention turned to the tibia.  Proximal cut with the  extramedullary guide.  8 mm removed protecting the intercondylar spine.  All  of the structures protected. Sized for a large 8 mm trial component.  Attention turned to the femur.  This was prepared with the appropriate jigs  which were used to size for a medium component bringing up to the tide mark.  Posterior cut was made and then the burs were used to trough out the condyle  for definitive placement of the femoral component.  Attention turned to the  appropriate tilting of the component along the condyle and also appropriate  longitudinal alignment.  When that was complete, trials put in place.  Medium on the femur and a large 8 mm on the tibia.  With these components  and with medial capsular release and removal of spurs, I had a nicely  balanced knee with full extension, full flexion, no liftoff and I was very  pleased with  alignment and stability.  The tibia was marked for appropriate  placement and then punched for the keel.  All trials were removed.  Copious  irrigation with the pulse irrigating device.  Cement prepared and placed on  both components which were firmly seated in place.  All excessive cement  removed.  Tongue blade placed between components to hold them in place while  the cement hardened.  Once the cement was hardened, the knee was re-  examined.  Again, full extension, full flexion, good alignment, good  stability, set at about 5 degrees of valgus.  Wounds irrigated.  Hemovac  placed.  Arthrotomy closed with #1 Vicryl.  Skin and subcutaneous tissue  with Vicryl and staples.  Portals were closed with staples as well.  Margins  of the wound and knee injected with Marcaine.  Sterile compressive dressing  applied.  Tourniquet deflated and removed.  Knee immobilizer applied.  Anesthesia reversed.  Brought to recovery room.  Tolerated surgery well.  No  complications.                                               Loreta Ave, M.D.    DFM/MEDQ  D:  11/26/2003  T:  11/26/2003  Job:  [id number]

## 2010-09-16 NOTE — Procedures (Signed)
[facility]  Patient:    Michael [name], Michael [name]                        MRN: [id number] Proc. Date: 02/13/00 Adm. Date:  [id number] Attending:  Thyra Breed                           Procedure Report  PROCEDURE:  Occipital nerve block.  DIAGNOSIS:  Occipital neuralgia with neck pain and headaches.  INTERVAL HISTORY:  The patients noted marked improvement with regard to his neck discomfort and headaches after his first injection. He has mild discomfort at best which he rates at 6/10.  PHYSICAL EXAMINATION:  VITAL SIGNS:  Blood pressure 139/71, respiratory rate 18, O2 saturations 97%, pain level 6/10.  He exhibits tenderness over both lesser occipital grooves bilaterally. Range of motion of his neck and deep tendon reflexes are unchanged from previously.  DESCRIPTION OF PROCEDURE:  After informed consent was obtained, the patient was placed in the sitting position and monitored. The greater occipital grooves were prepped out bilaterally with Betadine and injected with 2 cc of a mixture of 1% lidocaine with 0.5% levobupivacaine in a 1:1 ratio. The needle was removed intact.  CONDITION POST PROCEDURE:  The patient noted marked attenuation of his pain after 15 minutes.  DISPOSITION: 1. Resume previous diet. 2. Limitations in activities per instruction sheet as outlined by my assistant    today. 3. Continue on current medications. 4. Followup with me in 1 week if he continues to have discomfort otherwise    follow-up with me on an as needed basis. DD:  02/13/00 TD:  02/13/00 Job: [id number]

## 2010-09-16 NOTE — H&P (Signed)
NAMEARISTIDES, LUCKEY NO.:  [id number]   MEDICAL RECORD NO.:  [id number]            PATIENT TYPE:   LOCATION:                                 FACILITY:   PHYSICIAN:  Learta Codding, M.D. [facility]     DATE OF BIRTH:   DATE OF ADMISSION:  DATE OF DISCHARGE:                                HISTORY & PHYSICAL   PRIMARY CARDIOLOGIST:  Dr. Diona Browner   REASON FOR ADMISSION:  Unstable angina.   HISTORY OF PRESENT ILLNESS:  The patient is a 77 year old male with a  history of known coronary artery disease status post drug-eluting stent  placement to the right coronary artery in 2005.  Previously he had also two  stents to the mid right coronary artery.  The patient had a repeat cardiac  catheterization in June of 2006 and he was found to have 50% stenosis of the  proximal stent in the mid RCA and 70% LAD stenosis.  Cardiolite in follow-up  showed no definite ischemia.  The patient had been doing well up until  recently.  He states that one day several days ago he started complaining of  significant weakness and lack of energy.  Wednesday, the day he went to see  Dr. Eliberto Ivory in the morning he had an episode of severe substernal chest pain  lasting approximately 30 minutes with radiation to the left neck, left  shoulder, and left arm.  He took three nitroglycerin and aborted his  symptoms.  He was then seen in the office by Dr. Eliberto Ivory and mentioned his  complaints.  He was set up for an office appointment today.  Yesterday in  the office also he was found to have a heart rate of approximately 40 beats  per minute.  Today when seen in the office the patient again reported an  episode of chest pain early this morning around 6 a.m., although again he  had left-sided chest pain radiating to the left arm requiring three  nitroglycerin with subsequent resolution.  He also reports ongoing symptoms  of weakness and lethargy and chest pressure on exertion.  He denies any  orthopnea or PND.   He has no palpitation or syncope.  The patient is rather  tearful in the office today as his wife has also been recently diagnosed  with pancreatic cancer.  He is here in the office today with his daughter  and they are anticipating that he may require an admission to the hospital  for management of unstable angina.   ALLERGIES:  PENICILLIN, MORPHINE, and OXYCODONE.   MEDICATIONS:  1.  ADVAIR DISKUS b.i.d.  2.  Trazodone 50 mg p.o. q.h.s.  3.  Lipitor 40 mg p.o. daily.  4.  Levoxyl 0.125 mg p.o. daily.  5.  Protonix 40 mg p.o. daily.  6.  Plavix 75 mg daily.  7.  Enteric-coated aspirin 81 mg a day.  8.  Nasonex.   PAST MEDICAL HISTORY:  1.  Please see problem list below regarding history of coronary artery      disease.  The patient never  presented with myocardial infarction      previously.  2.  Chronic obstructive pulmonary disease.  3.  Gastroesophageal reflux disease.  4.  Dyslipidemia.   SOCIAL HISTORY:  The patient has a history of tobacco use and continues to  smoke a small amount.  He denies any significant alcohol use.  He is married  and lives with his wife who has been diagnosed with pancreatic cancer.  He  has been taking care of his wife.   FAMILY HISTORY:  Contributory for history of coronary artery disease.   REVIEW OF SYSTEMS:  As described in the H&P.  No cough or hemoptysis.  Denies any fever, chills.  No orthopnea, PND.  No palpitations or syncope.  No melena or hematochezia.  No dysuria or frequency.   PHYSICAL EXAMINATION:  VITAL SIGNS:  Blood pressure 142/63, heart rate is 61  beats per minute.  NECK:  Normal carotid upstroke.  No carotid bruits.  LUNGS:  Clear breath sounds bilaterally.  HEART:  Regular rate and rhythm with a normal S1, S2.  No murmurs, rubs, or  gallops.  ABDOMEN:  Soft, nontender.  No rebound or guarding.  Good bowel sounds.  EXTREMITIES:  No clubbing, cyanosis, edema.  NEUROLOGIC:  The patient is alert, oriented, and grossly  nonfocal.   LABORATORIES:  Pending and laboratory work was done by Dr. Eliberto Ivory in the  office yesterday.  He reports to me that his hemoglobin was 12.  Also, his  lipid panel has been under control.   EKG:  Normal sinus rhythm.  No acute ischemic changes.   PROBLEM LIST:  1.  Unstable angina.  2.  History of coronary artery disease.      1.  Status post stent x2 to the mid right coronary artery.      2.  Status post CYPHER stent to the distal right coronary artery October          2005.      3.  Relook catheterization June 2006 with 50% stenosis, proximal stent          70% left anterior descending stenosis.      4.  Normal Cardiolite in 2006 with no ischemia.      5.  Ejection fraction 65%.  3.  Dyslipidemia.  4.  Allergy to penicillin, morphine, and oxycodone.  5.  Chronic obstructive pulmonary disease.  6.  Tobacco use.  7.  History of anemia (stable with recent hemoglobin 12).      1.  Status post work-up per Dr. Cyndie Chime.      2.  Gastrointestinal work-up with evidence for subacute gastritis felt          to be secondary to aspirin and Plavix, although the patient is          currently tolerating this.  8.  Pruritus secondary to Nu-Iron.  9.  Bradycardia, asymptomatic.  Heart rate in the 40s in Dr. Magnus Ivan      office.  10. Elevated PSA.   PLAN:  1.  The patient's symptoms are consistent with unstable angina.  He has had      two episodes in succession over the last two days, each time resolved      with several nitroglycerin.  The patient's coronary anatomy is high risk      and will plan on admitting him for cardiac catheterization.  2.  The patient will continue aspirin and Plavix and will prescribe him      Nitro-Paste  and possibly calcium channel blocker.  Will hold off on beta      blocker secondary to low heart rate.  3.  Lovenox will be instituted at 1 mg/kg subcutaneous q.12h.  4.  We have notified [facility] and the patient will be scheduled      for a catheterization tomorrow.  He will go immediate to the ER today.      Learta Codding, M.D. [facility]  Electronically Signed     GED/MEDQ  D:  09/07/2005  T:  09/07/2005  Job:  [id number]

## 2010-09-16 NOTE — Cardiovascular Report (Signed)
NAMEJABRIEL, Michael [name] NO.:  [id number]   MEDICAL RECORD NO.:  [id number]          PATIENT TYPE:  INP   LOCATION:  2021                         FACILITY:  [facility]   PHYSICIAN:  Charlton Haws, M.D.     DATE OF BIRTH:  1931-06-02   DATE OF PROCEDURE:  02/29/2004  DATE OF DISCHARGE:                              CARDIAC CATHETERIZATION   PROCEDURE:  Coronary arteriography.   INDICATIONS FOR PROCEDURE:  Recent angioplasty and stenting of distal right  coronary artery with recurrent chest pain within a two weeks time.   DESCRIPTION OF PROCEDURE:  Cardiac catheterization was done from the right  femoral artery.   The left main coronary artery has a 20% discrete stenosis.   The left anterior descending artery had a smooth 70% tubular lesion which  was seen previously.   The circumflex coronary artery was normal.   The right coronary artery had stents in the proximal to mid portion with 30%  residual stenosis.  The new stents in the distal right coronary artery were  widely patent just before the PDA.  The PDA, itself, was normal.  Apparently  during the last case, there was some distal embolization to the  posterolateral branches, these were not seen and would appear to have lysed  as both posterolateral branches were widely patent without significant  disease or cut off.   Ventriculography was not performed since the patient just had cath less than  two weeks ago. Aortic pressure was 110/64.  LV pressure was 115/8.   IMPRESSION:  The patient's chest pain would appear to be noncardiac in  etiology.  He will need a follow up Cardiolite study in 8-12 weeks.  I  suspect at some point in the future, he may need intervention to the LAD but  it does not appear to be currently flow limiting.  Dr. Gerri Spore commented  on his last cath that there was no ischemia by Cardiolite in the LAD  distribution.  If the patient's groin heals well, he will be discharged in  the  morning.       PN/MEDQ  D:  02/29/2004  T:  02/29/2004  Job:  [id number]   cc:   Jonelle Sidle, M.D. [facility]   Carole Binning, M.D. [facility]

## 2010-09-16 NOTE — Cardiovascular Report (Signed)
NAMERivka Barbara NO.:  [id number]   MEDICAL RECORD NO.:  [id number]                   PATIENT TYPE:   LOCATION:                                       FACILITY:   PHYSICIAN:  [name], M.D. [facility]         DATE OF BIRTH:   DATE OF PROCEDURE:  12/02/2001  DATE OF DISCHARGE:                              CARDIAC CATHETERIZATION   INDICATIONS:  The patient is a 77 year old gentleman who has had exertional  shortness of breath. He has had a prior Cardiolite done which suggested  possible inferior ischemia. The current study was done to assess coronary  anatomy.   PROCEDURES:  1. Right and left heart catheterization.  2. Selective coronary arteriography.  3. Selective left ventriculography.   DESCRIPTION OF PROCEDURE:  The procedure was performed from the right  femoral artery using 6 French catheters.  He tolerated the procedure without  complication. We did initially left heart catheterization with selective  coronary arteriography. Because of his history of dyspnea and the fact that  the patient was scheduled for right heart catheterization, right heart  catheterization was then performed to exclude alternative explanations for  dyspnea.  There were no complications from the procedure. He was taken to  the holding area in satisfactory clinical condition.   HEMODYNAMIC DATA:   PRESSURES:  1. Right atrium 2.  2. Right ventricle 16/13.  3. Pulmonary artery 15/7.  4. Pulmonary capillary wedge 6.  5. Left ventricle 123/6.  6. Aortic 132/63.  7. Pulmonary artery saturation 59%.  8. Superior vena cava saturation 59%.  9. Aortic saturation 94%.  10.      Thermodilution cardiac outputs 3.8 L/min., 2.06 L/min. per sq m.  11.      Fick output 3.7 L/min., 2.0 L/min. per sq m.   ANGIOGRAPHIC DATA:  1. Ventriculography was performed in the RAO projection. Overall systolic     function was preserved, but ejection fraction appeared to be  low-normal.     EF was calculated at 53%.  2. The left main coronary artery was long and was free of critical disease.  3. The LAD coursed to the apex.  There was a large diagonal. Beyond the     large diagonal, at the origin of the large septal there is about a 70-75%     area of stenosis in the mid vessel. This is slightly enhanced by     intracoronary nitroglycerin.  The distal vessel was free of critical     disease.  4. The AV circumflex is without critical disease.  5. The right coronary artery is a large dominant vessel. There is a fair     amount of calcification in the mid LAD. There is a shepherd's crook     takeoff. Beyond the shepherd's crook takeoff is a segmental area of     disease measuring about 70% to at worst 80% luminal reduction.  The vessel     bends in the midportion distally.  The PDA and posterolateral are free of     critical disease.   CONCLUSION:  1. Normal right heart pressures.  2. Preserved overall left ventricular function with low-normal calculated     ejection fraction.  3. Two-vessel coronary artery disease with 70-75% narrowing in the mid left     anterior descending and 70-80% narrowing segmentally in the right     coronary artery.   DISPOSITION:  I plan to review with my colleagues. There are multiple  options here. I will discuss potential options with the patient.                                                 [name], M.D. [facility]    TDS/MEDQ  D:  12/02/2001  T:  12/06/2001  Job:  [id number]   cc:   [facility]   Weyman Pedro, M.D.   Jonelle Sidle, M.D. [facility]

## 2010-09-16 NOTE — Op Note (Signed)
NAMECELEDONIO, SORTINO                           ACCOUNT NO.:  [id number]   MEDICAL RECORD NO.:  [id number]                   PATIENT TYPE:  AMB   LOCATION:  DAY                                  FACILITY:  [facility]   PHYSICIAN:  Lionel December, M.D.                 DATE OF BIRTH:  02-Nov-1931   DATE OF PROCEDURE:  03/28/2002  DATE OF DISCHARGE:                                 OPERATIVE REPORT   PROCEDURE:  Esophagogastroduodenoscopy.   ENDOSCOPIST:  Lionel December, M.D.   INDICATIONS:  This patient is a 77 year old Caucasian male who has chest  pain felt to be noncardiac.  He has not responded to acid suppression. He  undergoing a diagnostic esophagogastroduodenoscopy.   The risks were reviewed with the patient and informed consent was obtained.   PREOPERATIVE MEDICATIONS:  Cetacaine spray for oropharyngeal topical  anesthesia, Demerol 25 mg IV and Versed 4 mg IV in divided dose.   INSTRUMENT:  Olympus video system.   FINDINGS:  Procedure performed in endoscopy suite.  The patient's vital  signs and O2 saturation were monitored during the procedure and remained  stable.  The patient was placed in the left lateral recumbent position and  endoscope was passed via the oropharynx without any difficulty into the  esophagus.   ESOPHAGUS:  Mucosa of the esophagus was normal throughout.  There was focal  erythema at the GE junction.  No hernia was noted.   STOMACH:  It was empty and distended very well with insufflation.  The folds  of the proximal stomach were normal.  Examination of the mucosa revealed  patchy erythema at antrum.  Please note that the patient was treated for H.  pylori infection in 1996 by Dr. Eliberto Ivory.  Pyloric channel was patent.  Angularis and fundus were examined by retroflexing the scope and were  normal.   DUODENUM:  Examination of the bulb and second and third part of the duodenum  was normal.   The endoscope was withdrawn.  The patient tolerated the procedure  well.   FINAL DIAGNOSES:  1. Mild changes of reflux esophagitis limited to GE junction.  2. Nonerosive antral gastritis.   DISCUSSION:  These findings would not explain patient's symptomatology.    RECOMMENDATIONS:  1. He will continue PPI for now.  2. Antibiotics trial with Levaquin 500 mg p.o. q.i.d. for 10 days to see if     it helps with symptoms (he may have chronic bronchitis).  The patient     will call me with progress report 2 weeks from now.                                               Lionel December, M.D.    NR/MEDQ  D:  03/28/2002  T:  03/28/2002  Job:  [id number]   cc:   Weyman Pedro, M.D.  [facility]

## 2010-09-16 NOTE — Discharge Summary (Signed)
Michael [name], Michael [name] NO.:  [id number]   MEDICAL RECORD NO.:  [id number]          PATIENT TYPE:  INP   LOCATION:  2021                         FACILITY:  [facility]   PHYSICIAN:  Guy Franco, P.A. [facility]   DATE OF BIRTH:  05/25/1931   DATE OF ADMISSION:  02/29/2004  DATE OF DISCHARGE:  03/01/2004                           DISCHARGE SUMMARY - REFERRING   DISCHARGE DIAGNOSES:  1.  Chest pain.  2.  Known coronary artery disease.  3.  Thyroid toxicosis, status post ablation, now on thyroid replacement      therapy.  4.  Hyperlipidemia.  5.  Chronic obstructive pulmonary disease.  6.  History of tobacco abuse.  7.  Anxiety.  8.  Insomnia.  9.  Status post cholecystectomy.   HISTORY OF PRESENT ILLNESS:  The patient is a 77 year old male patient with  a known history of coronary artery disease who was admitted to [facility] in mid-October 2005, with unstable angina, who proceeded  to receive a drug-eluting stent to right coronary artery with distal  embolization, and therefore ruled in for a subendocardial myocardial  infarction.  He was eventually discharged on February 18, 2004.  He did have  some post-PCI chest discomfort but stated that he had no discomfort with  ambulation and the pain was similar to cardiac pain; however, over the past  week he has complained of chest pain that has worsened and is similar to his  cardiac pain, and he is now here for a re-look cardiac catheterization.   HOSPITAL COURSE:  He underwent a re-look cardiac catheterization on February 29, 2004.  He was found to have the following:  Left main 20%, LAD 70%, mid-  circumflex angiographically normal.  The RCA had a 30% in-stent restenosis.  At this point Dr. Charlton Haws and Dr. Emilie Rutter. Pulsipher consulted regarding  this patient, and felt that the patient needs to have an outpatient  Cardiolite in approximately six to eight weeks to assess the LAD territory.  Apparently he  has had a Cardiolite in the past that did not show any  ischemia in that area; however, this needs to be redefined.   DISPOSITION:  The patient is discharged to home on March 01, 2004, in  stable condition.   DISCHARGE MEDICATIONS:  1.  Protonix 40 mg daily.  2.  Otherwise he is to continue his same medications which include Lipitor      40 mg q.h.s.  3.  Combivent MDI.  4.  Advair 100/50 mg b.i.d.  5.  Levothyroxine 100 mcg daily.  6.  Trazodone 50 mg q.h.s.  7.  Plavix 75 mg daily.  8.  Aspirin 325 mg daily.  9.  Sublingual nitroglycerin p.r.n. chest pain.   DISCHARGE INSTRUCTIONS:  1.  No strenuous activity or heavy lifting of over 10 pounds for one week.  2.  He may resume cardiac rehabilitation next week.  3.  To remain on a low-fat diet.  4.  Clean the cardiac catheterization site with soap and water.  5.  Call for questions or concerns.  FOLLOWUP:  Follow up with Suszanne Conners. Julious Oka. on March 10, 2004, at 11  a.m.  At this appointment we need to set up the re-stress Cardiolite to  assess his LAD territory.        ___________________________________________  Guy Franco, P.A. [facility]    LB/MEDQ  D:  03/01/2004  T:  03/01/2004  Job:  [id number]   cc:   Jonelle Sidle, M.D. [facility]   Weyman Pedro, M.D

## 2010-09-19 ENCOUNTER — Encounter: Payer: Self-pay | Admitting: *Deleted

## 2010-09-23 ENCOUNTER — Encounter: Payer: Self-pay | Admitting: Internal Medicine

## 2010-09-23 ENCOUNTER — Ambulatory Visit (INDEPENDENT_AMBULATORY_CARE_PROVIDER_SITE_OTHER): Payer: TRICARE For Life (TFL) | Admitting: Internal Medicine

## 2010-09-23 VITALS — BP 122/62 | HR 89 | Ht 68.0 in | Wt 160.2 lb

## 2010-09-23 DIAGNOSIS — I2510 Atherosclerotic heart disease of native coronary artery without angina pectoris: Secondary | ICD-10-CM

## 2010-09-23 DIAGNOSIS — F17200 Nicotine dependence, unspecified, uncomplicated: Secondary | ICD-10-CM

## 2010-09-23 DIAGNOSIS — Z77090 Contact with and (suspected) exposure to asbestos: Secondary | ICD-10-CM

## 2010-09-23 DIAGNOSIS — J449 Chronic obstructive pulmonary disease, unspecified: Secondary | ICD-10-CM

## 2010-09-23 NOTE — Patient Instructions (Signed)
Please don't smoke !!  Ok to go forward with [name] planning surgery as guided by him.

## 2010-09-23 NOTE — Progress Notes (Signed)
Subjective:    Patient ID: Michael [name], male    DOB: [birth year], 77 y.o.   MRN: [id number]  HPI    Review of Systems     Objective:   Physical Exam        Assessment & Plan:   Subjective:    Patient ID: Michael [name], male    DOB: Jul 09, 1931, 77 y.o.   MRN: [id number]  HPI 08/14/00- 60 yo M current smoker, coming for f/u of COPD and allergic rhinitis. Had pneumonia about a month ago and brings CXR from 07/18/10 done by [name], showing COPD but NAD on my screen. . Still smokes 1 Pack/ 3days.  Admits daily morning cough, usually clear or slight yellow sputum but no blood. Admits some SOB, but exercises 3 days/ week at rehab center. Wheezes some. Continues Advair, Spiriva. Proair doesn't help a lot. Had cardiac evaluation/ stress test by cardiologist- "ok".  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 He brings a newsletter from [employer] and from [place] [facility] [place], and from them he asks about CT to assess for asbestos related changes.   09/23/10 Smoker with hx asbestos exposure, COPD, allergic rhinitis. Newly dx'd renal cancer- to see [name]. Daughter here He notes a little cough occasionallly , not much. Denies chest pain, palpitation.He has not really tried to stop smoking. I reviewed his CT abd which included some lung level views and shared the images with them today. No lung abnormality described, but thee is significant atherosclerosis.   Review of Systems See HPI Constitutional:   No weight loss, night sweats,  Fevers, chills, fatigue, lassitude. HEENT:   No headaches,  Difficulty swallowing,  Tooth/dental problems,  Sore throat,                No sneezing, itching, ear ache, nasal congestion, post nasal drip,   CV:  No chest pain,  Orthopnea, PND, swelling in lower extremities, anasarca, dizziness, palpitations  GI  No heartburn, indigestion, abdominal pain, nausea, vomiting, diarrhea, change in bowel habits, loss of  appetite  Resp.  No excess mucus,,  No coughing up of blood.  No change in color of mucus.  No wheezing.    Skin: no rash or lesions.  GU: no dysuria, change in color of urine, no urgency or frequency.  No flank pain.  MS:  No joint pain or swelling.  No decreased range of motion.  No back pain.  Psych:  No change in mood or affect. No depression or anxiety.  No memory loss.       Objective:   Physical Exam General- Alert, Oriented, Affect-appropriate, Distress- none acute  Skin- rash-none, lesions- none, excoriation- none  Lymphadenopathy- none  Head- atraumatic  Eyes- Gross vision intact, PERRLA, conjunctivae clear secretions  Ears- Hearing Normal-  Nose- Clear,  No-Septal dev, mucus, polyps, erosion, perforation   Throat- Mallampati II , mucosa clear , drainage- none, tonsils- atrophic  Neck- flexible , trachea midline, no stridor , thyroid nl, carotid no bruit  Chest - symmetrical excursion , unlabored     Heart/CV- RRR , no murmur , no gallop  , no rub, nl s1 s2                     - JVD- none , edema- none, stasis changes- none, varices- none     Lung-Coarse sounds bilaterally, cough- none , dullness-none, rub- none. I can't hear fine crackles.  Chest wall- Abd- tender-no, distended-no, bowel sounds-present, HSM- no  Br/ Gen/ Rectal- Not done, not indicated  Extrem- cyanosis- none, clubbing, none, atrophy- none, strength- nl  Neuro- grossly intact to observation        Assessment & Plan:

## 2010-09-23 NOTE — Assessment & Plan Note (Signed)
PFT reviewed. I don't see major barrier to necessary Urologic surgery. He is stable now.

## 2010-09-26 ENCOUNTER — Encounter: Payer: Self-pay | Admitting: Internal Medicine

## 2010-09-26 NOTE — Assessment & Plan Note (Signed)
Smoking cessation reiterated.

## 2010-09-26 NOTE — Assessment & Plan Note (Signed)
Extensive atherosclerotic disease.

## 2010-09-26 NOTE — Assessment & Plan Note (Signed)
We will need to target the lungs with CT to really address the question of asbestos disease.

## 2010-09-30 ENCOUNTER — Encounter: Payer: Self-pay | Admitting: Internal Medicine

## 2010-10-06 ENCOUNTER — Other Ambulatory Visit: Payer: Self-pay | Admitting: *Deleted

## 2010-10-06 ENCOUNTER — Other Ambulatory Visit: Payer: Self-pay | Admitting: Urology

## 2010-10-06 DIAGNOSIS — N2889 Other specified disorders of kidney and ureter: Secondary | ICD-10-CM

## 2010-10-06 MED ORDER — TIOTROPIUM BROMIDE MONOHYDRATE 18 MCG IN CAPS: 18.0000 ug | ORAL_CAPSULE | Freq: Every day | RESPIRATORY_TRACT | Status: DC

## 2010-10-13 ENCOUNTER — Encounter: Payer: Self-pay | Admitting: Cardiology

## 2010-10-19 ENCOUNTER — Ambulatory Visit
Admission: RE | Admit: 2010-10-19 | Discharge: 2010-10-19 | Disposition: A | Discharge status: Home / Self Care | Payer: Medicare Other | Source: Ambulatory Visit | Attending: Urology | Admitting: Urology

## 2010-10-19 VITALS — BP 122/64 | HR 73 | Resp 16 | Ht 68.0 in | Wt 160.0 lb

## 2010-10-19 DIAGNOSIS — N2889 Other specified disorders of kidney and ureter: Secondary | ICD-10-CM

## 2010-10-20 ENCOUNTER — Ambulatory Visit (INDEPENDENT_AMBULATORY_CARE_PROVIDER_SITE_OTHER): Payer: Medicare Other | Admitting: Adult Health

## 2010-10-20 ENCOUNTER — Encounter: Payer: Self-pay | Admitting: Adult Health

## 2010-10-20 VITALS — BP 121/86 | HR 76 | Ht 68.0 in | Wt 159.0 lb

## 2010-10-20 DIAGNOSIS — R9389 Abnormal findings on diagnostic imaging of other specified body structures: Secondary | ICD-10-CM

## 2010-10-20 DIAGNOSIS — R918 Other nonspecific abnormal finding of lung field: Secondary | ICD-10-CM

## 2010-10-20 DIAGNOSIS — I2510 Atherosclerotic heart disease of native coronary artery without angina pectoris: Secondary | ICD-10-CM

## 2010-10-20 DIAGNOSIS — I10 Essential (primary) hypertension: Secondary | ICD-10-CM

## 2010-10-20 NOTE — Progress Notes (Signed)
HPI: [name] is a pleasant 77 y/o male patient of Dr. Andee [name] and Michael Searing PA in the [place] office.  He is followed there for ongoing assessment and treatment of of nonobstructive CAD, with most recent stress myoview in January of 2012 demonstrating no ischemia and normal LV fx.  He also has history of hypertension, hypothyroidism, hyperlipidemia, and asbestosis exposure with associated lung disease.  He is followed by Dr. Maple [name] for this.  He has had a recent CT scan of the chest demonstrating bilateral pleural plaques with subpleural reticulation, also incidental imaging of the upper abdomen which showed a 8 mm low attenuation lesion in the dome of the liver, low attenuation lesions in the right kidney measuring up to 1.2 cm.  He is to undergo biopsy of this lesion under general anesthesia and is to have a pre-op clearance from cardiology.  He is without symptoms of chest pain, diaphoresis or exertional angina.  He continues to have chronic issues with his breathing.  Allergies  Allergen Reactions  . Ciprofloxacin     REACTION: nausea  . Hydrocodone-Acetaminophen   . Oxycodone     hives  . Penicillins     REACTION: hives    Current Outpatient Prescriptions  Medication Sig Dispense Refill  . albuterol (PROAIR HFA) 108 (90 BASE) MCG/ACT inhaler Inhale 2 puffs into the lungs every 6 (six) hours as needed.       Marland Kitchen amLODipine (NORVASC) 10 MG tablet Take 10 mg by mouth daily.        Marland Kitchen aspirin 81 MG tablet Take 81 mg by mouth daily.        Marland Kitchen atorvastatin (LIPITOR) 40 MG tablet Take 40 mg by mouth daily.        . cetirizine (ZYRTEC) 10 MG tablet Take 10 mg by mouth daily.        . citalopram (CELEXA) 20 MG tablet Take 20 mg by mouth daily.        . ferrous sulfate 325 (65 FE) MG tablet Take 325 mg by mouth at bedtime.       . Fluticasone-Salmeterol (ADVAIR DISKUS) 250-50 MCG/DOSE AEPB Inhale 1 puff into the lungs every 12 (twelve) hours.        Marland Kitchen guaifenesin (HUMIBID E) 400 MG TABS Take 400 mg  by mouth 2 (two) times daily.        . hydrochlorothiazide 25 MG tablet Take 25 mg by mouth daily.        . isosorbide mononitrate (IMDUR) 60 MG 24 hr tablet Take 1 tablet (60 mg total) by mouth daily.  30 tablet  6  . levothyroxine (SYNTHROID, LEVOTHROID) 125 MCG tablet Take 125 mcg by mouth daily.        . mometasone (NASONEX) 50 MCG/ACT nasal spray 2 sprays by Nasal route daily.        . Multiple Vitamin (MULTIVITAMIN) tablet Take 1 tablet by mouth daily.        . nitroGLYCERIN (NITROSTAT) 0.4 MG SL tablet Place 0.4 mg under the tongue every 5 (five) minutes as needed.        . Omega-3 Fatty Acids (FISH OIL) 1200 MG CAPS Take 2 capsules by mouth 2 (two) times daily.        . ranitidine (ZANTAC) 150 MG tablet Take 150 mg by mouth daily.        . Tamsulosin HCl (FLOMAX) 0.4 MG CAPS Take 0.4 mg by mouth daily.        . theophylline (THEOPHYLLINE)  100 MG 12 hr tablet Take 1 tablet (100 mg total) by mouth 2 (two) times daily.  60 tablet  2  . tiotropium (SPIRIVA) 18 MCG inhalation capsule Place 1 capsule (18 mcg total) into inhaler and inhale daily.  30 capsule  5  . traZODone (DESYREL) 100 MG tablet Take 100 mg by mouth at bedtime.          Past Medical History  Diagnosis Date  . Hyperthyroidism 1978    RAI  . Shingles 2001  . Heart attack   . Prostate cancer     xrt/IMRT 2008  . Vitreous hemorrhage   . PVD (peripheral vascular disease)   . Other symptoms involving cardiovascular system   . Coronary atherosclerosis of native coronary artery     stent x 3-one drug eluting  . Other and unspecified hyperlipidemia   . Unspecified essential hypertension   . Allergic rhinitis, cause unspecified   . Chronic airway obstruction, not elsewhere classified   . History of asbestos exposure   . Left renal mass     Past Surgical History  Procedure Date  . Popliteal synovial cyst excision     knee  . Cholecystectomy   . Cervical disc surgery     fusion  . Partial knee arthroplasty      Right-replacement  . Nasal septum surgery     SMR    EAV:WUJWJX of systems complete and found to be negative unless listed above PHYSICAL EXAM BP 121/86  Pulse 76  Ht 5\' 8"  (1.727 m)  Wt 159 lb (72.122 kg)  BMI 24.18 kg/m2  SpO2 93% General: Well developed, well nourished, in no acute distress Head: Eyes PERRLA, No xanthomas.   Normal cephalic and atramatic  Lungs: Clear bilaterally to auscultation and percussion. Heart: HRRR S1 S2,.  Pulses are 2+ & equal.            No carotid bruit. No JVD.  No abdominal bruits. No femoral bruits. Abdomen: Bowel sounds are positive, abdomen soft and non-tender without masses or                  Hernia's noted. Msk:  Back normal, normal gait. Normal strength and tone for age. Extremities: No clubbing, cyanosis or edema.  DP +1 Neuro: Alert and oriented X 3. Psych:  Good affect, responds appropriately  EKG: SR with 1st degree AVB, LAFB rate of 68 bpm.  Unchanged from EKG in January of 2012.  ASSESSMENT AND PLAN

## 2010-10-20 NOTE — Patient Instructions (Signed)
Your physician recommends that you schedule a follow-up appointment in:6 months Surgical clearance given

## 2010-10-20 NOTE — Assessment & Plan Note (Signed)
Well controlled at present.  Will not make any medication changes at this time.

## 2010-10-20 NOTE — Assessment & Plan Note (Signed)
Mr. Mengel is stable from a cardiac standpoint. He has no changes in his EKG from January, he is not having chest pain, or cardiac symptoms. He has also been seen by Dr. Juanito Doom on this visit who has also reviewed his history.  He is cleared for biopsy under general anesthesia with low risk for cardiac event. He will follow-up with Dr. Andee Lineman in [place] post operatively.

## 2010-10-28 ENCOUNTER — Other Ambulatory Visit (HOSPITAL_COMMUNITY): Payer: Self-pay | Admitting: Interventional Radiology

## 2010-10-28 ENCOUNTER — Encounter: Payer: Self-pay | Admitting: Adult Health

## 2010-10-28 DIAGNOSIS — C649 Malignant neoplasm of unspecified kidney, except renal pelvis: Secondary | ICD-10-CM

## 2010-10-30 HISTORY — PX: RENAL CRYOABLATION: SHX2322

## 2010-10-31 ENCOUNTER — Encounter: Payer: Self-pay | Admitting: Adult Health

## 2010-11-14 ENCOUNTER — Telehealth: Payer: Self-pay | Admitting: Adult Health

## 2010-11-14 NOTE — Telephone Encounter (Signed)
Faxed EKG & PFT to [facility] ([phone]).

## 2010-11-21 ENCOUNTER — Other Ambulatory Visit: Payer: Self-pay | Admitting: Interventional Radiology

## 2010-11-21 ENCOUNTER — Encounter (HOSPITAL_COMMUNITY): Payer: Medicare Other

## 2010-11-21 LAB — CBC
Platelets: 258 10*3/uL (ref 150–400)
RBC: 4.11 MIL/uL — ABNORMAL LOW (ref 4.22–5.81)
WBC: 9.8 10*3/uL (ref 4.0–10.5)

## 2010-11-21 LAB — BASIC METABOLIC PANEL
Calcium: 9.7 mg/dL (ref 8.4–10.5)
Chloride: 93 mEq/L — ABNORMAL LOW (ref 96–112)
Creatinine, Ser: 1.2 mg/dL (ref 0.50–1.35)
GFR calc Af Amer: >60 mL/min (ref >60)
Sodium: 130 mEq/L — ABNORMAL LOW (ref 135–145)

## 2010-11-21 LAB — PROTIME-INR: INR: 1.04 (ref 0.00–1.49)

## 2010-11-21 LAB — APTT: aPTT: 35 seconds (ref 24–37)

## 2010-11-24 ENCOUNTER — Other Ambulatory Visit: Payer: Self-pay | Admitting: *Deleted

## 2010-11-24 MED ORDER — MOMETASONE FUROATE 50 MCG/ACT NA SUSP: 2.0000 | Freq: Every day | NASAL | Status: DC

## 2010-11-25 ENCOUNTER — Ambulatory Visit (HOSPITAL_COMMUNITY)
Admission: RE | Admit: 2010-11-25 | Discharge: 2010-11-25 | Disposition: A | Discharge status: Home / Self Care | Payer: Medicare Other | Source: Ambulatory Visit | Attending: Interventional Radiology | Admitting: Interventional Radiology

## 2010-11-25 ENCOUNTER — Ambulatory Visit (HOSPITAL_COMMUNITY)
Admission: RE | Admit: 2010-11-25 | Discharge: 2010-11-26 | Disposition: A | Discharge status: Home / Self Care | Payer: Medicare Other | Source: Ambulatory Visit | Attending: Interventional Radiology | Admitting: Interventional Radiology

## 2010-11-25 ENCOUNTER — Other Ambulatory Visit: Payer: Self-pay | Admitting: Interventional Radiology

## 2010-11-25 ENCOUNTER — Other Ambulatory Visit (HOSPITAL_COMMUNITY): Payer: Medicare Other

## 2010-11-25 DIAGNOSIS — D4959 Neoplasm of unspecified behavior of other genitourinary organ: Secondary | ICD-10-CM | POA: Insufficient documentation

## 2010-11-25 DIAGNOSIS — N189 Chronic kidney disease, unspecified: Secondary | ICD-10-CM | POA: Insufficient documentation

## 2010-11-25 DIAGNOSIS — Z77090 Contact with and (suspected) exposure to asbestos: Secondary | ICD-10-CM | POA: Insufficient documentation

## 2010-11-25 DIAGNOSIS — C649 Malignant neoplasm of unspecified kidney, except renal pelvis: Secondary | ICD-10-CM

## 2010-11-25 DIAGNOSIS — Z87891 Personal history of nicotine dependence: Secondary | ICD-10-CM | POA: Insufficient documentation

## 2010-11-25 DIAGNOSIS — J984 Other disorders of lung: Secondary | ICD-10-CM | POA: Insufficient documentation

## 2010-11-25 DIAGNOSIS — Z9861 Coronary angioplasty status: Secondary | ICD-10-CM | POA: Insufficient documentation

## 2010-11-25 DIAGNOSIS — I2510 Atherosclerotic heart disease of native coronary artery without angina pectoris: Secondary | ICD-10-CM | POA: Insufficient documentation

## 2010-11-25 DIAGNOSIS — I129 Hypertensive chronic kidney disease with stage 1 through stage 4 chronic kidney disease, or unspecified chronic kidney disease: Secondary | ICD-10-CM | POA: Insufficient documentation

## 2010-11-25 DIAGNOSIS — J438 Other emphysema: Secondary | ICD-10-CM | POA: Insufficient documentation

## 2010-11-25 DIAGNOSIS — E785 Hyperlipidemia, unspecified: Secondary | ICD-10-CM | POA: Insufficient documentation

## 2010-11-25 DIAGNOSIS — Z01812 Encounter for preprocedural laboratory examination: Secondary | ICD-10-CM | POA: Insufficient documentation

## 2010-11-25 LAB — CROSSMATCH: Unit division: 0

## 2010-11-26 LAB — BASIC METABOLIC PANEL
BUN: 10 mg/dL (ref 6–23)
CO2: 29 mEq/L (ref 19–32)
Calcium: 8.1 mg/dL — ABNORMAL LOW (ref 8.4–10.5)
Glucose, Bld: 95 mg/dL (ref 70–99)
Potassium: 3.1 mEq/L — ABNORMAL LOW (ref 3.5–5.1)
Sodium: 131 mEq/L — ABNORMAL LOW (ref 135–145)

## 2010-11-26 LAB — CBC
HCT: 33.1 % — ABNORMAL LOW (ref 39.0–52.0)
Hemoglobin: 11.5 g/dL — ABNORMAL LOW (ref 13.0–17.0)
MCH: 32.2 pg (ref 26.0–34.0)
RBC: 3.57 MIL/uL — ABNORMAL LOW (ref 4.22–5.81)

## 2010-12-02 ENCOUNTER — Inpatient Hospital Stay (HOSPITAL_COMMUNITY)
Admission: AD | Admit: 2010-12-02 | Discharge: 2010-12-02 | DRG: 287 | Disposition: A | Discharge status: Home / Self Care | Payer: Medicare Other | Source: Other Acute Inpatient Hospital | Attending: Cardiovascular Disease | Admitting: Cardiovascular Disease

## 2010-12-02 DIAGNOSIS — Z880 Allergy status to penicillin: Secondary | ICD-10-CM

## 2010-12-02 DIAGNOSIS — Z77090 Contact with and (suspected) exposure to asbestos: Secondary | ICD-10-CM

## 2010-12-02 DIAGNOSIS — F17200 Nicotine dependence, unspecified, uncomplicated: Secondary | ICD-10-CM | POA: Diagnosis present

## 2010-12-02 DIAGNOSIS — E039 Hypothyroidism, unspecified: Secondary | ICD-10-CM | POA: Diagnosis present

## 2010-12-02 DIAGNOSIS — I2510 Atherosclerotic heart disease of native coronary artery without angina pectoris: Secondary | ICD-10-CM

## 2010-12-02 DIAGNOSIS — Z888 Allergy status to other drugs, medicaments and biological substances status: Secondary | ICD-10-CM

## 2010-12-02 DIAGNOSIS — Z8249 Family history of ischemic heart disease and other diseases of the circulatory system: Secondary | ICD-10-CM

## 2010-12-02 DIAGNOSIS — I10 Essential (primary) hypertension: Secondary | ICD-10-CM | POA: Diagnosis present

## 2010-12-02 DIAGNOSIS — Z886 Allergy status to analgesic agent status: Secondary | ICD-10-CM

## 2010-12-02 DIAGNOSIS — E785 Hyperlipidemia, unspecified: Secondary | ICD-10-CM | POA: Diagnosis present

## 2010-12-02 DIAGNOSIS — IMO0002 Reserved for concepts with insufficient information to code with codable children: Secondary | ICD-10-CM | POA: Diagnosis present

## 2010-12-02 DIAGNOSIS — I200 Unstable angina: Secondary | ICD-10-CM

## 2010-12-02 DIAGNOSIS — Z9861 Coronary angioplasty status: Secondary | ICD-10-CM

## 2010-12-02 DIAGNOSIS — R0789 Other chest pain: Secondary | ICD-10-CM | POA: Diagnosis present

## 2010-12-02 DIAGNOSIS — J449 Chronic obstructive pulmonary disease, unspecified: Secondary | ICD-10-CM | POA: Diagnosis present

## 2010-12-02 DIAGNOSIS — Z96659 Presence of unspecified artificial knee joint: Secondary | ICD-10-CM

## 2010-12-02 DIAGNOSIS — M1990 Unspecified osteoarthritis, unspecified site: Secondary | ICD-10-CM | POA: Diagnosis present

## 2010-12-02 DIAGNOSIS — I679 Cerebrovascular disease, unspecified: Secondary | ICD-10-CM | POA: Diagnosis present

## 2010-12-02 DIAGNOSIS — J4489 Other specified chronic obstructive pulmonary disease: Secondary | ICD-10-CM | POA: Diagnosis present

## 2010-12-02 LAB — PROTIME-INR: INR: 0.99 (ref 0.00–1.49)

## 2010-12-02 LAB — CBC
HCT: 34.9 % — ABNORMAL LOW (ref 39.0–52.0)
Hemoglobin: 12.5 g/dL — ABNORMAL LOW (ref 13.0–17.0)
MCHC: 35.8 g/dL (ref 30.0–36.0)
MCV: 91.4 fL (ref 78.0–100.0)
RDW: 13.9 % (ref 11.5–15.5)
WBC: 8.6 10*3/uL (ref 4.0–10.5)

## 2010-12-02 LAB — APTT: aPTT: 35 seconds (ref 24–37)

## 2010-12-06 ENCOUNTER — Other Ambulatory Visit: Payer: Self-pay | Admitting: Interventional Radiology

## 2010-12-06 DIAGNOSIS — N2889 Other specified disorders of kidney and ureter: Secondary | ICD-10-CM

## 2010-12-22 ENCOUNTER — Ambulatory Visit (INDEPENDENT_AMBULATORY_CARE_PROVIDER_SITE_OTHER): Payer: Medicare Other | Admitting: Cardiovascular Disease

## 2010-12-22 ENCOUNTER — Encounter: Payer: Self-pay | Admitting: *Deleted

## 2010-12-22 DIAGNOSIS — F17200 Nicotine dependence, unspecified, uncomplicated: Secondary | ICD-10-CM

## 2010-12-22 DIAGNOSIS — I2510 Atherosclerotic heart disease of native coronary artery without angina pectoris: Secondary | ICD-10-CM

## 2010-12-22 DIAGNOSIS — Z720 Tobacco use: Secondary | ICD-10-CM | POA: Insufficient documentation

## 2010-12-22 DIAGNOSIS — E785 Hyperlipidemia, unspecified: Secondary | ICD-10-CM

## 2010-12-22 NOTE — Assessment & Plan Note (Signed)
Continue treatment with atorvastatin 40 mg once daily. Goal LDL is less than 70.

## 2010-12-22 NOTE — Assessment & Plan Note (Signed)
The patient has known history of coronary artery disease status post angioplasty and stent placements to the right coronary artery. His recent cardiac catheterization showed patent stents with moderate instent restenosis. He does have a moderate stenosis in left anterior descending artery.  Aggressive medical therapy is recommended. I discussed with him the importance of lifestyle changes as well as regular exercise.

## 2010-12-22 NOTE — Progress Notes (Signed)
HPI  This is a 77 year old gentleman who is here today for a followup visit. He presented recently to [facility] with chest pain. He ruled out for myocardial infarction. Due to his recurrent symptoms of chest pain, I recommended repeat cardiac catheterization which was done at [facility]. This showed patent stents in the right coronary artery with mild instent restenosis. There is a moderate disease in the proximal to mid left anterior descending artery as well as mild disease in the left main and left circumflex arteries. Since then, the patient has not had any further chest pain. He has chronic exertional dyspnea. He does have lung disease thought to be related to previous asbestos exposure. He continues to smoke unfortunately.  Allergies  Allergen Reactions  . Ciprofloxacin     REACTION: nausea  . Hydrocodone-Acetaminophen   . Oxycodone     hives  . Penicillins     REACTION: hives     Current Outpatient Prescriptions on File Prior to Visit  Medication Sig Dispense Refill  . albuterol (PROAIR HFA) 108 (90 BASE) MCG/ACT inhaler Inhale 2 puffs into the lungs every 6 (six) hours as needed.       Marland Kitchen amLODipine (NORVASC) 10 MG tablet Take 10 mg by mouth daily.        Marland Kitchen aspirin 81 MG tablet Take 81 mg by mouth daily.        Marland Kitchen atorvastatin (LIPITOR) 40 MG tablet Take 40 mg by mouth daily.        . cetirizine (ZYRTEC) 10 MG tablet Take 10 mg by mouth daily.        . citalopram (CELEXA) 20 MG tablet Take 20 mg by mouth daily.        . ferrous sulfate 325 (65 FE) MG tablet Take 325 mg by mouth at bedtime.       . Fluticasone-Salmeterol (ADVAIR DISKUS) 250-50 MCG/DOSE AEPB Inhale 1 puff into the lungs every 12 (twelve) hours.        . hydrochlorothiazide 25 MG tablet Take 25 mg by mouth daily.        . isosorbide mononitrate (IMDUR) 60 MG 24 hr tablet Take 1 tablet (60 mg total) by mouth daily.  30 tablet  6  . levothyroxine (SYNTHROID, LEVOTHROID) 125 MCG tablet Take 125 mcg by  mouth daily.        . mometasone (NASONEX) 50 MCG/ACT nasal spray Place 2 sprays into the nose daily.  17 g  2  . Multiple Vitamin (MULTIVITAMIN) tablet Take 1 tablet by mouth daily.        . nitroGLYCERIN (NITROSTAT) 0.4 MG SL tablet Place 0.4 mg under the tongue every 5 (five) minutes as needed.        . Omega-3 Fatty Acids (FISH OIL) 1200 MG CAPS Take 2 capsules by mouth 2 (two) times daily.        . ranitidine (ZANTAC) 150 MG tablet Take 150 mg by mouth daily.        . Tamsulosin HCl (FLOMAX) 0.4 MG CAPS Take 0.4 mg by mouth daily.        . theophylline (THEOPHYLLINE) 100 MG 12 hr tablet Take 1 tablet (100 mg total) by mouth 2 (two) times daily.  60 tablet  2  . tiotropium (SPIRIVA) 18 MCG inhalation capsule Place 1 capsule (18 mcg total) into inhaler and inhale daily.  30 capsule  5  . traZODone (DESYREL) 100 MG tablet Take 100 mg by mouth at bedtime.  Past Medical History  Diagnosis Date  . Hyperthyroidism 1978    RAI  . Shingles 2001  . Heart attack   . Vitreous hemorrhage   . PVD (peripheral vascular disease)   . Other symptoms involving cardiovascular system   . Other and unspecified hyperlipidemia   . Unspecified essential hypertension   . Allergic rhinitis, cause unspecified   . Chronic airway obstruction, not elsewhere classified   . History of asbestos exposure   . Left renal mass   . Tobacco abuse   . Prostate cancer     xrt/IMRT 2008  . Renal cancer     cryoablation in 2012  . Coronary atherosclerosis of native coronary artery     stent x 3-one drug eluting     Past Surgical History  Procedure Date  . Popliteal synovial cyst excision     knee  . Cholecystectomy   . Cervical disc surgery     fusion  . Partial knee arthroplasty     Right-replacement  . Nasal septum surgery     SMR  . Cardiac catheterization   . Coronary angioplasty     RCA      Family History  Problem Relation Age of Onset  . Allergies    . Heart disease    . Cancer      . Coronary artery disease       History   Social History  . Marital Status: Widowed    Spouse Name: N/A    Number of Children: N/A  . Years of Education: N/A   Occupational History  . Retired      [employer]   Social History Main Topics  . Smoking status: Current Everyday Smoker -- 0.2 packs/day for 60 years    Types: Cigarettes  . Smokeless tobacco: Never Used  . Alcohol Use: No  . Drug Use: No  . Sexually Active: Not on file   Other Topics Concern  . Not on file   Social History Narrative  . No narrative on file      PHYSICAL EXAM   BP 114/66  Pulse 80  Ht 5\' 8"  (1.727 m)  Wt 160 lb (72.576 kg)  BMI 24.33 kg/m2  Constitutional: He is oriented to person, place, and time. He appears well-developed and well-nourished. No distress.  HENT: No nasal discharge.  Head: Normocephalic and atraumatic.  Eyes: Pupils are equal, round, and reactive to light. Right eye exhibits no discharge. Left eye exhibits no discharge.  Neck: Normal range of motion. Neck supple. No JVD present. No thyromegaly present.  Cardiovascular: Normal rate, regular rhythm, normal heart sounds and intact distal pulses. Exam reveals no gallop and no friction rub.  No murmur heard.  Pulmonary/Chest: Effort normal and breath sounds normal. No stridor. No respiratory distress. He has no wheezes. He has no rales. He exhibits no tenderness.  Abdominal: Soft. Bowel sounds are normal. He exhibits no distension. There is no tenderness. There is no rebound and no guarding.  Musculoskeletal: Normal range of motion. He exhibits no edema and no tenderness.  Neurological: He is alert and oriented to person, place, and time. Coordination normal.  Skin: Skin is warm and dry. No rash noted. He is not diaphoretic. No erythema. No pallor.  Psychiatric: He has a normal mood and affect. His behavior is normal. Judgment and thought content normal.  Right radial area is intact with no hematoma. Radial  pulse is normal.      ASSESSMENT AND PLAN

## 2010-12-22 NOTE — Patient Instructions (Signed)
Your physician wants you to follow-up in: 6 months. You will receive a reminder letter in the mail one-two months in advance. If you don't receive a letter, please call our office to schedule the follow-up appointment. Your physician recommends that you continue on your current medications as directed. Please refer to the Current Medication list given to you today. Your physician discussed the hazards of tobacco use. Tobacco use cessation is recommended and techniques and options to help you quit were discussed. 

## 2010-12-22 NOTE — Assessment & Plan Note (Signed)
I had a prolonged discussion with him about the importance of smoking cessation. He does not seem to be mentally ready to quit.

## 2010-12-26 ENCOUNTER — Encounter: Payer: Self-pay | Admitting: Vascular Surgery

## 2010-12-27 NOTE — Discharge Summary (Signed)
[name], [name] NO.:  [id number]  MEDICAL RECORD NO.:  [id number]  LOCATION:  6522                         FACILITY:  [facility]  PHYSICIAN:  Michael [name], MDDATE OF BIRTH:  January 21, 1932  DATE OF ADMISSION:  12/02/2010 DATE OF DISCHARGE:  12/02/2010                              DISCHARGE SUMMARY   PROCEDURES: 1. Cardiac catheterization. 2. Coronary arteriogram. 3. Left ventriculogram.  PRIMARY FINAL DISCHARGE DIAGNOSIS:  Chest pain, medical therapy for coronary artery disease recommended.  SECONDARY DIAGNOSES: 1. History of chest pain in January 2012, cardiac enzymes and Myoview     negative. 2. History of multiple percutaneous interventions with stent to the     right coronary artery, none since 2005. 3. Status post cardiac catheterization last in 2010, showing left main     20%, left anterior descending 40%, no obstructive disease in the     circumflex, right coronary artery with in-stent restenosis between     30 and 40% and an ejection fraction of 65%. 4. Hypertension. 5. Hyperlipidemia. 6. Cerebrovascular disease with 60-79% stenosis bilaterally. 7. Ongoing tobacco use. 8. Chronic obstructive pulmonary disease. 9. History of hypothyroidism, thyroidectomy. 10.Cervical disk disease with anterior cervical fusion. 11.Degenerative disk disease and degenerative joint disease. 12.History of hemorrhage. 13.Possible history of ulcers. 14.History of herpes zoster. 15.History of exposure to asbestosis. 16.Family history of coronary artery disease in 3 brothers. 17.Status post cholecystectomy and right knee surgery as well as     colonoscopy. 18.Status post bilateral knee replacements, Baker cyst surgery and     cataract extraction. 19.Allergy or intolerance to penicillin, levofloxacin, gabapentin,     morphine, oxycodone, iron, and Cipro.  TIME AT DISCHARGE:  32 minutes.  HOSPITAL COURSE:  Michael [name] is a 77 year old male with a history  of coronary artery disease.  He had chest pain and went to [facility] where his symptoms were concerning for anginal pain.  He was admitted and seen there by Cardiology.  A D-dimer was elevated but a V/Q scan was low prompt for PE.  Dr. Kirke [name] recommended cardiac catheterization and he was transferred to [facility] for the procedure.  The cardiac catheterization showed no significant disease in the circumflex, LAD 50%, diagonal II 30% and 20-30% lesions including in- stent restenosis in the RCA.  His EF was 55%.  Dr. Gala [name] reviewed the films and recommended medical therapy.  He also recommended smoking cessation strongly.  Postprocedure, Michael [name] is ambulating without chest pain or shortness of breath.  He is tentatively considered stable for discharge, to follow up as an outpatient.  DISCHARGE INSTRUCTIONS:  He is encouraged to increase his activity slowly.  He is to stick to a low-sodium heart-healthy diet.  He is not to use tobacco.  He is not to do any lifting for a week with his right arm.  He is not to do any driving for a day.  He is to call our office for problems with the cath site.  He is to follow up with [facility] and a message will be left.  He is to follow up with primary care as needed.  DISCHARGE MEDICATIONS: 1. Celexa 20 mg a day. 2.  Trazodone 100 mg at bedtime p.r.n. 3. Fish oil 1200 mg 2 capsules b.i.d. 4. Spiriva daily. 5. Nasonex at bedtime. 6. Amlodipine 10 mg a day. 7. Lipitor 40 mg at bedtime. 8. Ranitidine 150 mg q.a.m. 9. Iron 325 mg a day. 10.Multivitamins daily. 11.Imdur 60 mg daily. 12.Sublingual nitroglycerin p.r.n. 13.Aspirin 81 mg a day. 14.Theophylline 200 mg 1-1/2 tablet b.i.d. 15.Zyrtec daily p.r.n. 16.Flomax 0.4 mg daily. 17.Advair 250/50 b.i.d. 18.ProAir q.6 h. p.r.n. 19.Hydrochlorothiazide 25 mg a day. 20.Levothyroxine 125 mcg a day.     Michael Demark, PA-C   ______________________________ Michael Buckles.  Bensimhon, MD    RB/MEDQ  D:  12/02/2010  T:  12/03/2010  Job:  [id number]  cc:   Michael Alto, MD  Electronically Signed by Michael Demark PA-C on 12/08/2010 02:21:55 PM Electronically Signed by Michael Meres MD on 12/27/2010 05:21:14 PM

## 2010-12-27 NOTE — Cardiovascular Report (Signed)
Michael [name] NO.:  [id number]  MEDICAL RECORD NO.:  [id number]  LOCATION:  6522                         FACILITY:  [facility]  PHYSICIAN:  Bevelyn Buckles. Bensimhon, MDDATE OF BIRTH:  07/20/1931  DATE OF PROCEDURE:  12/02/2010 DATE OF DISCHARGE:  12/02/2010                           CARDIAC CATHETERIZATION   PATIENT IDENTIFICATION:  [name] is a 77 year old male with a history of coronary artery disease status post multiple percutaneous interventions on his right coronary artery.  Most recently, he underwent cath in 2010 which showed just mild in-stent restenosis in the right coronary artery with mild nonobstructive disease in the left system. Unfortunately, he continues to smoke cigarettes.  He was admitted to [facility] with recurrent chest pain.  D-dimer is mildly elevated.  He had a V/Q scan for pulmonary embolus with low probability.  His cardiac markers were normal.  EKG was unremarkable.  He was referred for cardiac catheterization for possible unstable angina.  PROCEDURES PERFORMED: 1. Selective coronary angiography. 2. Left heart cath. 3. Left ventriculogram.  DESCRIPTION OF PROCEDURE:  The risks and indications were explained. Consent was signed and placed on the chart.  After confirmation of a normal Allen test in the right upper extremity, the right wrist area was prepped and draped in routine sterile fashion, anesthetized with 1% local lidocaine.  A 5-French arterial sheath was placed using a modified Seldinger technique.  Once the sheath was in place, he got 3 mg of intra- arterial verapamil and 3500 units of systemic heparin.  Standard catheters including JL-3.5, JR-4, and straight pigtail were used.  All catheter exchanges were made over wire.  There were no apparent complications.  Central aortic pressure was 95/55 with a mean of 72.  LV pressure was 111/3 with an EDP of 11.  There was no aortic stenosis.  Left main was normal.  LAD  coursed to the apex.  It gave off a moderate-sized first diagonal, a large second diagonal.  In the mid LAD, there was a 50% smooth lesion. In the second diagonal, there was a 30% mid lesion.  Circumflex was made up of predominantly first marginal branch, was angiographically normal.  Right coronary artery was a large dominant vessel, was tortuous.  It had a long area of stenting in the proximal to mid section.  It gave off a PDA and 2 posterolaterals.  Within the stent, there was about 30% in- stent restenosis.  This was unchanged from 2010.  Around the distal bend, there was a 30% stenosis and more distally, there was a 20% stenosis.  Left ventriculogram done in the RAO position showed an EF of 55% with no regional wall motion abnormalities.  ASSESSMENT: 1. Nonobstructive coronary artery disease.  His right coronary artery     stent is patent with only mild in-stent restenosis.  This is     unchanged from 2010.  There is mild-to-moderate nonobstructive     disease in the left anterior descending. 2. Normal left ventricular function.  PLAN:  [name] continues to have patent revascularization.  We will continue with medical therapy.  I have advised him to quit smoking. Hopefully, he can be discharged home later today.  Bevelyn Buckles. Bensimhon, MD     DRB/MEDQ  D:  12/02/2010  T:  12/03/2010  Job:  [id number]  Electronically Signed by Arvilla Meres MD on 12/27/2010 05:21:36 PM

## 2011-01-04 ENCOUNTER — Encounter (HOSPITAL_COMMUNITY): Payer: Self-pay

## 2011-01-04 ENCOUNTER — Ambulatory Visit
Admission: RE | Admit: 2011-01-04 | Discharge: 2011-01-04 | Disposition: A | Discharge status: Home / Self Care | Payer: Medicare Other | Source: Ambulatory Visit | Attending: Interventional Radiology | Admitting: Interventional Radiology

## 2011-01-04 ENCOUNTER — Ambulatory Visit (HOSPITAL_COMMUNITY)
Admission: RE | Admit: 2011-01-04 | Discharge: 2011-01-04 | Disposition: A | Discharge status: Home / Self Care | Payer: Medicare Other | Source: Ambulatory Visit | Attending: Interventional Radiology | Admitting: Interventional Radiology

## 2011-01-04 VITALS — BP 130/67 | HR 84 | Temp 97.9°F | Resp 16

## 2011-01-04 DIAGNOSIS — N2889 Other specified disorders of kidney and ureter: Secondary | ICD-10-CM

## 2011-01-04 DIAGNOSIS — D4959 Neoplasm of unspecified behavior of other genitourinary organ: Secondary | ICD-10-CM | POA: Insufficient documentation

## 2011-01-04 DIAGNOSIS — C61 Malignant neoplasm of prostate: Secondary | ICD-10-CM | POA: Insufficient documentation

## 2011-01-04 DIAGNOSIS — Q619 Cystic kidney disease, unspecified: Secondary | ICD-10-CM | POA: Insufficient documentation

## 2011-01-04 MED ORDER — IOHEXOL 300 MG/ML  SOLN
75.0000 mL | Freq: Once | INTRAMUSCULAR | Status: AC | PRN
  Administered 2011-01-04: 75 mL via INTRAVENOUS

## 2011-01-06 NOTE — Progress Notes (Signed)
Pt states that he has been experiencing some left flank pain.  Denies hematuria.  Denies problems w/ urination.

## 2011-01-19 ENCOUNTER — Telehealth: Payer: Self-pay | Admitting: Radiology

## 2011-01-19 ENCOUNTER — Other Ambulatory Visit: Payer: Self-pay | Admitting: Interventional Radiology

## 2011-01-19 DIAGNOSIS — N2889 Other specified disorders of kidney and ureter: Secondary | ICD-10-CM

## 2011-01-19 NOTE — Telephone Encounter (Signed)
Phoned pt to check status post Cryo of Left Renal mass (11-25-2010).  Pt states that pain has decreased since f/u office visit w/ Dr Fredia Sorrow on 01/04/2011.    Continues to take Rx pain med as needed, not needed as frequently.  Denies any other concerns at present.    Pt instructed to call as needed.  Our office will contact pt late Dec 2012/early Jan 2013 to schedule 6 mo f/u Cryoablation CT & OV w/ Dr Fredia Sorrow.

## 2011-01-23 ENCOUNTER — Other Ambulatory Visit: Payer: Self-pay | Admitting: Emergency Medicine

## 2011-02-02 ENCOUNTER — Encounter: Payer: Self-pay | Admitting: Vascular Surgery

## 2011-02-03 ENCOUNTER — Other Ambulatory Visit (INDEPENDENT_AMBULATORY_CARE_PROVIDER_SITE_OTHER): Payer: Medicare Other | Admitting: *Deleted

## 2011-02-03 ENCOUNTER — Ambulatory Visit (INDEPENDENT_AMBULATORY_CARE_PROVIDER_SITE_OTHER): Payer: Medicare Other | Admitting: Internal Medicine

## 2011-02-03 ENCOUNTER — Encounter: Payer: Self-pay | Admitting: Vascular Surgery

## 2011-02-03 ENCOUNTER — Encounter: Payer: Self-pay | Admitting: Internal Medicine

## 2011-02-03 ENCOUNTER — Ambulatory Visit (INDEPENDENT_AMBULATORY_CARE_PROVIDER_SITE_OTHER): Payer: Medicare Other | Admitting: Vascular Surgery

## 2011-02-03 VITALS — BP 121/63 | HR 77 | Resp 20 | Ht 68.0 in | Wt 156.0 lb

## 2011-02-03 VITALS — BP 110/68 | HR 81 | Ht 68.0 in | Wt 156.4 lb

## 2011-02-03 DIAGNOSIS — F17200 Nicotine dependence, unspecified, uncomplicated: Secondary | ICD-10-CM

## 2011-02-03 DIAGNOSIS — Z77090 Contact with and (suspected) exposure to asbestos: Secondary | ICD-10-CM

## 2011-02-03 DIAGNOSIS — I6529 Occlusion and stenosis of unspecified carotid artery: Secondary | ICD-10-CM | POA: Insufficient documentation

## 2011-02-03 DIAGNOSIS — J4489 Other specified chronic obstructive pulmonary disease: Secondary | ICD-10-CM

## 2011-02-03 DIAGNOSIS — J449 Chronic obstructive pulmonary disease, unspecified: Secondary | ICD-10-CM

## 2011-02-03 NOTE — Progress Notes (Signed)
Patient ID: Michael [name], male    DOB: 1931/10/06, 77 y.o.   MRN: [id number] PCP Dr Leandrew Koyanagi HPI 08/14/00- 48 yo M current smoker, coming for f/u of COPD and allergic rhinitis. Had pneumonia about a month ago and brings CXR from 07/18/10 done by [name], showing COPD but NAD on my screen. . Still smokes 1 Pack/ 3days.  Admits daily morning cough, usually clear or slight yellow sputum but no blood. Admits some SOB, but exercises 3 days/ week at rehab center. Wheezes some. Continues Advair, Spiriva. Proair doesn't help a lot. Had cardiac evaluation/ stress test by cardiologist- "ok".  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 He brings a newsletter from [employer] and from [place] and [place], and from them he asks about CT to assess for asbestos related changes.   09/23/10 Smoker with hx asbestos exposure, COPD, allergic rhinitis. Newly dx'd renal cancer- to see [name]. Daughter here He notes a little cough occasionallly , not much. Denies chest pain, palpitation.He has not really tried to stop smoking. I reviewed his CT abd which included some lung level views and shared the images with them today. No lung abnormality described, but there is significant atherosclerosis.   02/03/11- Smoker with hx asbestos exposure, COPD, allergic rhinitis. Newly dx'd renal cancer  .. family here Had cryosurgery for his left renal cancer.- so far so good. Has had flu vaccine. Had chest x-ray since last here. Chest feels better with theophylline which was well tolerated. He still smokes one half pack per day we discussed trying again. We suggested nicotine replacement approaches since I notice he is chewing gum today.   Review of Systems See HPI Constitutional:   No-   weight loss, night sweats, fevers, chills, fatigue, lassitude. HEENT:   No-  headaches, difficulty swallowing, tooth/dental problems, sore throat,       No-  sneezing, itching, ear ache, nasal  congestion, post nasal drip,  CV:  No-   chest pain, orthopnea, PND, swelling in lower extremities, anasarca, dizziness, palpitations Resp: No- acute  shortness of breath with exertion or at rest.              No-   productive cough, little non-productive cough,  No-  coughing up of blood.              No-   change in color of mucus.  No- wheezing.   Skin: No-   rash or lesions. GI:  No-   heartburn, indigestion, abdominal pain, nausea, vomiting, diarrhea,                 change in bowel habits, loss of appetite GU: No-   dysuria, change in color of urine, no urgency or frequency.  No- flank pain. MS:  No-   joint pain or swelling.  No- decreased range of motion.  No- back pain. Neuro- grossly normal to observation, Or:  Psych:  No- change in mood or affect. No depression or anxiety.  No memory loss.   OBJ General- Alert, Oriented, Affect-appropriate, Distress- none acute; normal weight Skin- rash-none, lesions- none, excoriation- none Lymphadenopathy- none Head- atraumatic            Eyes- Gross vision intact, PERRLA, conjunctivae clear secretions            Ears- Hearing, canals-normal            Nose- Clear, no-Septal dev, mucus, polyps, erosion, perforation  Throat- Mallampati II , mucosa clear , drainage- none, tonsils- atrophic Neck- flexible , trachea midline, no stridor , thyroid nl, carotid no bruit Chest - symmetrical excursion , unlabored           Heart/CV- RRR , no murmur , no gallop  , no rub, nl s1 s2                           - JVD- none , edema- none, stasis changes- none, varices- none           Lung- decreased breath sounds, unlabored, wheeze- none, slight dry cough , dullness-none, rub- none           Chest wall-  Abd- tender-no, distended-no, bowel sounds-present, HSM- no Br/ Gen/ Rectal- Not done, not indicated Extrem- cyanosis- none, clubbing, none, atrophy- none, strength- nl Neuro- grossly intact to observation

## 2011-02-03 NOTE — Progress Notes (Signed)
Subjective:    Patient ID: Michael [name], male    DOB: [birth year], 77 y.o.   MRN: [id number]  HPI    Review of Systems     Objective:   Physical Exam        Assessment & Plan:   Subjective:    Patient ID: Michael [name], male    DOB: 30-Nov-1931, 77 y.o.   MRN: [id number]  HPI 08/14/00- 28 yo M current smoker, coming for f/u of COPD and allergic rhinitis. Had pneumonia about a month ago and brings CXR from 07/18/10 done by [name], showing COPD but NAD on my screen. . Still smokes 1 Pack/ 3days.  Admits daily morning cough, usually clear or slight yellow sputum but no blood. Admits some SOB, but exercises 3 days/ week at rehab center. Wheezes some. Continues Advair, Spiriva. Proair doesn't help a lot. Had cardiac evaluation/ stress test by cardiologist- "ok".  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 He brings a newsletter from [employer] and from [place] and [place], and from them he asks about CT to assess for asbestos related changes.   09/23/10 Smoker with hx asbestos exposure, COPD, allergic rhinitis. Newly dx'd renal cancer- to see [name]. Daughter here He notes a little cough occasionallly , not much. Denies chest pain, palpitation.He has not really tried to stop smoking. I reviewed his CT abd which included some lung level views and shared the images with them today. No lung abnormality described, but thee is significant atherosclerosis.   Review of Systems See HPI Constitutional:   No weight loss, night sweats,  Fevers, chills, fatigue, lassitude. HEENT:   No headaches,  Difficulty swallowing,  Tooth/dental problems,  Sore throat,                No sneezing, itching, ear ache, nasal congestion, post nasal drip,   CV:  No chest pain,  Orthopnea, PND, swelling in lower extremities, anasarca, dizziness, palpitations  GI  No heartburn, indigestion, abdominal pain, nausea, vomiting, diarrhea, change in bowel habits, loss of  appetite  Resp.  No excess mucus,,  No coughing up of blood.  No change in color of mucus.  No wheezing.    Skin: no rash or lesions.  GU: no dysuria, change in color of urine, no urgency or frequency.  No flank pain.  MS:  No joint pain or swelling.  No decreased range of motion.  No back pain.  Psych:  No change in mood or affect. No depression or anxiety.  No memory loss.       Objective:   Physical Exam General- Alert, Oriented, Affect-appropriate, Distress- none acute  Skin- rash-none, lesions- none, excoriation- none  Lymphadenopathy- none  Head- atraumatic  Eyes- Gross vision intact, PERRLA, conjunctivae clear secretions  Ears- Hearing Normal-  Nose- Clear,  No-Septal dev, mucus, polyps, erosion, perforation   Throat- Mallampati II , mucosa clear , drainage- none, tonsils- atrophic  Neck- flexible , trachea midline, no stridor , thyroid nl, carotid no bruit  Chest - symmetrical excursion , unlabored     Heart/CV- RRR , no murmur , no gallop  , no rub, nl s1 s2                     - JVD- none , edema- none, stasis changes- none, varices- none     Lung-Coarse sounds bilaterally, cough- none , dullness-none, rub- none. I can't hear fine crackles.  Chest wall- Abd- tender-no, distended-no, bowel sounds-present, HSM- no  Br/ Gen/ Rectal- Not done, not indicated  Extrem- cyanosis- none, clubbing, none, atrophy- none, strength- nl  Neuro- grossly intact to observation        Assessment & Plan:

## 2011-02-03 NOTE — Progress Notes (Signed)
[facility]   Established Carotid Patient  History of Present Illness  Michael [name] is a 77 y.o. male who presents with chief complaint: routine carotid surveillance.  Previous carotid studies demonstrated: RICA 40-59% stenosis, LICA 40-59% stenosis.  Patient has no history of TIA or stroke symptom.  The patient has never had amaurosis fugax or monocular blindness.  The patient has never had facial drooping or hemiplegia.  The patient has never had receptive or expressive aphasia.    Past Medical History, Past Surgical History, Social History, Family History, Medications, Allergies, and Review of Systems are unchanged from previous evaluation on 02/04/10.  Physical Examination  Filed Vitals:   02/03/11 1555  BP: 121/63  Pulse: 77  Resp: 20  Height: 5\' 8"  (1.727 m)  Weight: 156 lb (70.761 kg)    General: A&O x 3, WDWN  Eyes: PERRLA, EOMI  Pulmonary: Sym exp, good air movt, CTAB, no rales, rhonchi, & wheezing  Cardiac: RRR, Nl S1, S2, no Murmurs, rubs or gallops  Vascular: Vessel Right Left  Radial Palpable Palpable  Brachial Palpable Palpable  Carotid Palpable, without bruit Palpable, without bruit  Aorta Non-palpable N/A  Femoral Palpable Palpable  Popliteal Non-palpable Non-palpable  PT Palpable Palpable  DP Palpable Palpable   Gastrointestinal: soft, NTND, -G/R, - HSM, - masses, - CVAT B  Musculoskeletal: M/S 5/5 throughout , Extremities without ischemic changes   Neurologic: CN 2-12 intact , Pain and light touch intact in extremities , Motor exam as listed above  Non-Invasive Vascular Imaging  CAROTID DUPLEX (Date: 02/03/11):   R ICA stenosis: 40-59%  R VA: patent and antegrade  L ICA stenosis: 40-59%  L VA:  patent and antegrade  Medical Decision Making  [name] is a 77 y.o. male who presents with: B low grade ICA stenoses.   Unfortunately, calcification is evident on today's study so his carotid velocities may  be artificially lowered by the acoustic shadowing  He recently underwent cryoablation to a kidney for renal cell cancer so I do not recommend immediate exposure to nephrotoxic contrast  Based on the patient's vascular studies and examination, I have offered the patient: CTA Neck in 6 months.  He will follow up with [place] at that time.  Thank you for allowing [place] to participate in this patient's care.  Leonides Sake, MD [facility] [place] Office: [phone] Pager: [phone]

## 2011-02-03 NOTE — Patient Instructions (Signed)
Please keep trying to stop smoking- it's important

## 2011-02-05 ENCOUNTER — Encounter: Payer: Self-pay | Admitting: Internal Medicine

## 2011-02-05 NOTE — Assessment & Plan Note (Signed)
We emphasized the magnified risk from combining asbestos exposure and tobacco exposure. Smoking cessation programs have been offered.

## 2011-02-05 NOTE — Assessment & Plan Note (Signed)
We discussed how often justify the radiation and cost of a chest CT as opposed to x-ray followup. We anticipate chest x-ray on return in 6 months unless symptoms chang

## 2011-02-05 NOTE — Assessment & Plan Note (Signed)
He is tolerating theophylline and believes that helps him.

## 2011-02-07 NOTE — Procedures (Unsigned)
CAROTID DUPLEX EXAM  INDICATION:  Carotid disease.  HISTORY: Diabetes:  No. Cardiac:  Yes. Hypertension:  Yes. Smoking:  Yes. Previous Surgery:  No. CV History:  Currently asymptomatic. Amaurosis Fugax No, Paresthesias No, Hemiparesis No.                                      RIGHT             LEFT Brachial systolic pressure:         120               122 Brachial Doppler waveforms:         Normal            Normal Vertebral direction of flow:        Antegrade         Antegrade DUPLEX VELOCITIES (cm/sec) CCA peak systolic                   71                73 ECA peak systolic                   230               196 ICA peak systolic                   231               250 ICA end diastolic                   34                29 PLAQUE MORPHOLOGY:                  Calcific          Calcific PLAQUE AMOUNT:                      Moderate          Moderate PLAQUE LOCATION:                    ICA, ECA          ICA, ECA, CCA  IMPRESSION: 1. Right internal carotid artery velocities suggest 40% to 59%     stenosis. 2. Left internal carotid velocities suggest 40% to 59% stenosis. 3. Velocities may be under-estimated due to calcific plaque with     acoustic shadowing.  ___________________________________________ [name], MD  EM/MEDQ  D:  02/03/2011  T:  02/03/2011  Job:  [id number]

## 2011-02-14 LAB — LIPID PANEL
Cholesterol: 122
LDL Cholesterol: 54
Total CHOL/HDL Ratio: 2.4

## 2011-02-14 LAB — CARDIAC PANEL(CRET KIN+CKTOT+MB+TROPI)
Relative Index: INVALID
Total CK: 93
Troponin I: 0.01

## 2011-02-14 LAB — BASIC METABOLIC PANEL
BUN: 11
CO2: 25
Chloride: 104
Glucose, Bld: 88
Potassium: 4.3
Sodium: 135

## 2011-02-14 LAB — CBC
HCT: 31 — ABNORMAL LOW
Hemoglobin: 10.2 — ABNORMAL LOW
MCHC: 32.7
MCHC: 33.1
MCV: 85.1
MCV: 86.2
Platelets: 228
Platelets: 234
RBC: 3.75 — ABNORMAL LOW
RDW: 27.1 — ABNORMAL HIGH

## 2011-03-06 ENCOUNTER — Other Ambulatory Visit: Payer: Self-pay | Admitting: *Deleted

## 2011-03-06 MED ORDER — ISOSORBIDE MONONITRATE ER 60 MG PO TB24: 60.0000 mg | ORAL_TABLET | Freq: Every day | ORAL | Status: DC

## 2011-04-12 ENCOUNTER — Other Ambulatory Visit: Payer: Self-pay | Admitting: Interventional Radiology

## 2011-04-12 DIAGNOSIS — N2889 Other specified disorders of kidney and ureter: Secondary | ICD-10-CM

## 2011-05-03 ENCOUNTER — Other Ambulatory Visit: Payer: Self-pay | Admitting: Emergency Medicine

## 2011-05-03 DIAGNOSIS — N2889 Other specified disorders of kidney and ureter: Secondary | ICD-10-CM

## 2011-05-05 DIAGNOSIS — M1990 Unspecified osteoarthritis, unspecified site: Secondary | ICD-10-CM | POA: Diagnosis not present

## 2011-06-06 ENCOUNTER — Other Ambulatory Visit (HOSPITAL_COMMUNITY): Payer: Medicare Other

## 2011-06-12 DIAGNOSIS — I2510 Atherosclerotic heart disease of native coronary artery without angina pectoris: Secondary | ICD-10-CM | POA: Diagnosis not present

## 2011-06-12 DIAGNOSIS — I6529 Occlusion and stenosis of unspecified carotid artery: Secondary | ICD-10-CM | POA: Diagnosis not present

## 2011-06-12 DIAGNOSIS — Z79899 Other long term (current) drug therapy: Secondary | ICD-10-CM | POA: Diagnosis not present

## 2011-06-12 DIAGNOSIS — M542 Cervicalgia: Secondary | ICD-10-CM | POA: Diagnosis not present

## 2011-06-12 DIAGNOSIS — E785 Hyperlipidemia, unspecified: Secondary | ICD-10-CM | POA: Diagnosis not present

## 2011-06-12 DIAGNOSIS — R42 Dizziness and giddiness: Secondary | ICD-10-CM | POA: Diagnosis not present

## 2011-06-12 DIAGNOSIS — J309 Allergic rhinitis, unspecified: Secondary | ICD-10-CM | POA: Diagnosis not present

## 2011-06-12 DIAGNOSIS — I10 Essential (primary) hypertension: Secondary | ICD-10-CM | POA: Diagnosis not present

## 2011-06-12 DIAGNOSIS — M545 Low back pain: Secondary | ICD-10-CM | POA: Diagnosis not present

## 2011-06-12 DIAGNOSIS — J449 Chronic obstructive pulmonary disease, unspecified: Secondary | ICD-10-CM | POA: Diagnosis not present

## 2011-06-12 DIAGNOSIS — R5381 Other malaise: Secondary | ICD-10-CM | POA: Diagnosis not present

## 2011-06-12 DIAGNOSIS — Z9861 Coronary angioplasty status: Secondary | ICD-10-CM | POA: Diagnosis not present

## 2011-06-12 DIAGNOSIS — R0789 Other chest pain: Secondary | ICD-10-CM | POA: Diagnosis not present

## 2011-06-12 DIAGNOSIS — Z880 Allergy status to penicillin: Secondary | ICD-10-CM | POA: Diagnosis not present

## 2011-06-12 DIAGNOSIS — E039 Hypothyroidism, unspecified: Secondary | ICD-10-CM | POA: Diagnosis not present

## 2011-06-12 DIAGNOSIS — Z96659 Presence of unspecified artificial knee joint: Secondary | ICD-10-CM | POA: Diagnosis not present

## 2011-06-12 DIAGNOSIS — R079 Chest pain, unspecified: Secondary | ICD-10-CM | POA: Diagnosis not present

## 2011-06-12 DIAGNOSIS — R0602 Shortness of breath: Secondary | ICD-10-CM | POA: Diagnosis not present

## 2011-06-12 DIAGNOSIS — Z923 Personal history of irradiation: Secondary | ICD-10-CM | POA: Diagnosis not present

## 2011-06-12 DIAGNOSIS — N400 Enlarged prostate without lower urinary tract symptoms: Secondary | ICD-10-CM | POA: Diagnosis not present

## 2011-06-12 DIAGNOSIS — F329 Major depressive disorder, single episode, unspecified: Secondary | ICD-10-CM | POA: Diagnosis not present

## 2011-06-12 DIAGNOSIS — M79609 Pain in unspecified limb: Secondary | ICD-10-CM | POA: Diagnosis not present

## 2011-06-12 DIAGNOSIS — M719 Bursopathy, unspecified: Secondary | ICD-10-CM | POA: Diagnosis not present

## 2011-06-12 DIAGNOSIS — Z8546 Personal history of malignant neoplasm of prostate: Secondary | ICD-10-CM | POA: Diagnosis not present

## 2011-06-12 DIAGNOSIS — M19019 Primary osteoarthritis, unspecified shoulder: Secondary | ICD-10-CM | POA: Diagnosis not present

## 2011-06-12 DIAGNOSIS — F17200 Nicotine dependence, unspecified, uncomplicated: Secondary | ICD-10-CM | POA: Diagnosis not present

## 2011-06-12 DIAGNOSIS — E871 Hypo-osmolality and hyponatremia: Secondary | ICD-10-CM | POA: Diagnosis not present

## 2011-06-13 DIAGNOSIS — R079 Chest pain, unspecified: Secondary | ICD-10-CM | POA: Diagnosis not present

## 2011-06-13 DIAGNOSIS — I658 Occlusion and stenosis of other precerebral arteries: Secondary | ICD-10-CM | POA: Diagnosis not present

## 2011-06-13 DIAGNOSIS — I10 Essential (primary) hypertension: Secondary | ICD-10-CM | POA: Diagnosis not present

## 2011-06-13 DIAGNOSIS — F17200 Nicotine dependence, unspecified, uncomplicated: Secondary | ICD-10-CM | POA: Diagnosis not present

## 2011-06-15 DIAGNOSIS — F17200 Nicotine dependence, unspecified, uncomplicated: Secondary | ICD-10-CM | POA: Diagnosis not present

## 2011-06-15 DIAGNOSIS — E782 Mixed hyperlipidemia: Secondary | ICD-10-CM | POA: Diagnosis not present

## 2011-06-15 DIAGNOSIS — J309 Allergic rhinitis, unspecified: Secondary | ICD-10-CM | POA: Diagnosis not present

## 2011-06-15 DIAGNOSIS — E039 Hypothyroidism, unspecified: Secondary | ICD-10-CM | POA: Diagnosis not present

## 2011-06-15 DIAGNOSIS — I708 Atherosclerosis of other arteries: Secondary | ICD-10-CM | POA: Diagnosis not present

## 2011-06-15 DIAGNOSIS — I10 Essential (primary) hypertension: Secondary | ICD-10-CM | POA: Diagnosis not present

## 2011-06-15 DIAGNOSIS — M1990 Unspecified osteoarthritis, unspecified site: Secondary | ICD-10-CM | POA: Diagnosis not present

## 2011-06-15 DIAGNOSIS — J438 Other emphysema: Secondary | ICD-10-CM | POA: Diagnosis not present

## 2011-06-22 DIAGNOSIS — N200 Calculus of kidney: Secondary | ICD-10-CM | POA: Diagnosis not present

## 2011-06-22 DIAGNOSIS — D4959 Neoplasm of unspecified behavior of other genitourinary organ: Secondary | ICD-10-CM | POA: Diagnosis not present

## 2011-06-27 ENCOUNTER — Ambulatory Visit
Admission: RE | Admit: 2011-06-27 | Discharge: 2011-06-27 | Disposition: A | Discharge status: Home / Self Care | Payer: Medicare Other | Source: Ambulatory Visit | Attending: Interventional Radiology | Admitting: Interventional Radiology

## 2011-06-27 ENCOUNTER — Encounter (HOSPITAL_COMMUNITY): Payer: Self-pay

## 2011-06-27 ENCOUNTER — Ambulatory Visit (HOSPITAL_COMMUNITY)
Admission: RE | Admit: 2011-06-27 | Discharge: 2011-06-27 | Disposition: A | Discharge status: Home / Self Care | Payer: Medicare Other | Source: Ambulatory Visit | Attending: Interventional Radiology | Admitting: Interventional Radiology

## 2011-06-27 DIAGNOSIS — M47814 Spondylosis without myelopathy or radiculopathy, thoracic region: Secondary | ICD-10-CM | POA: Diagnosis not present

## 2011-06-27 DIAGNOSIS — I700 Atherosclerosis of aorta: Secondary | ICD-10-CM | POA: Diagnosis not present

## 2011-06-27 DIAGNOSIS — K7689 Other specified diseases of liver: Secondary | ICD-10-CM | POA: Insufficient documentation

## 2011-06-27 DIAGNOSIS — C649 Malignant neoplasm of unspecified kidney, except renal pelvis: Secondary | ICD-10-CM | POA: Insufficient documentation

## 2011-06-27 DIAGNOSIS — Z5189 Encounter for other specified aftercare: Secondary | ICD-10-CM | POA: Diagnosis not present

## 2011-06-27 DIAGNOSIS — D4959 Neoplasm of unspecified behavior of other genitourinary organ: Secondary | ICD-10-CM | POA: Diagnosis not present

## 2011-06-27 DIAGNOSIS — N2889 Other specified disorders of kidney and ureter: Secondary | ICD-10-CM

## 2011-06-27 DIAGNOSIS — Z9089 Acquired absence of other organs: Secondary | ICD-10-CM | POA: Insufficient documentation

## 2011-06-27 DIAGNOSIS — N289 Disorder of kidney and ureter, unspecified: Secondary | ICD-10-CM | POA: Insufficient documentation

## 2011-06-27 MED ORDER — IOHEXOL 300 MG/ML  SOLN
100.0000 mL | Freq: Once | INTRAMUSCULAR | Status: AC | PRN
  Administered 2011-06-27: 100 mL via INTRAVENOUS

## 2011-06-27 NOTE — Progress Notes (Signed)
Denies flank pain.  Denies hematuria.   Overall, patient is doing well.

## 2011-06-30 ENCOUNTER — Encounter: Payer: Medicare Other | Admitting: Physician Assistant

## 2011-07-03 ENCOUNTER — Encounter: Payer: Self-pay | Admitting: Physician Assistant

## 2011-07-03 ENCOUNTER — Other Ambulatory Visit (HOSPITAL_COMMUNITY): Payer: Self-pay | Admitting: Interventional Radiology

## 2011-07-03 ENCOUNTER — Ambulatory Visit (INDEPENDENT_AMBULATORY_CARE_PROVIDER_SITE_OTHER): Payer: Medicare Other | Admitting: Physician Assistant

## 2011-07-03 ENCOUNTER — Other Ambulatory Visit: Payer: Self-pay | Admitting: Interventional Radiology

## 2011-07-03 DIAGNOSIS — E785 Hyperlipidemia, unspecified: Secondary | ICD-10-CM | POA: Diagnosis not present

## 2011-07-03 DIAGNOSIS — I10 Essential (primary) hypertension: Secondary | ICD-10-CM

## 2011-07-03 DIAGNOSIS — I2510 Atherosclerotic heart disease of native coronary artery without angina pectoris: Secondary | ICD-10-CM

## 2011-07-03 DIAGNOSIS — I6529 Occlusion and stenosis of unspecified carotid artery: Secondary | ICD-10-CM

## 2011-07-03 DIAGNOSIS — F17200 Nicotine dependence, unspecified, uncomplicated: Secondary | ICD-10-CM | POA: Diagnosis not present

## 2011-07-03 NOTE — Progress Notes (Signed)
HPI: Patient presents for post hospital followup.  He was recently briefly hospitalized here at [facility] with chest pain, ruled out for MI with normal cardiac markers, and referred to [place] in consultation. We increased Imdur to 90 mg daily, and suggested considering a stress Myoview, if he were to have persistent CP.  Patient presents today reporting no recurrent angina. Moreover, he has significantly curtailed his tobacco smoking, currently down to 1-2 cigs a day. He has not had to use any SL NTG.  Allergies  Allergen Reactions  . Ciprofloxacin     REACTION: nausea  . Hydrocodone-Acetaminophen   . Iron Itching  . Lyrica (Pregabalin)   . Morphine And Related   . Oxycodone     hives  . Penicillins     REACTION: hives    Current Outpatient Prescriptions  Medication Sig Dispense Refill  . albuterol (PROAIR HFA) 108 (90 BASE) MCG/ACT inhaler Inhale 2 puffs into the lungs every 6 (six) hours as needed.       Marland Kitchen amLODipine (NORVASC) 10 MG tablet Take 10 mg by mouth daily.        Marland Kitchen aspirin 81 MG tablet Take 81 mg by mouth daily.        Marland Kitchen atorvastatin (LIPITOR) 40 MG tablet Take 40 mg by mouth daily.        . cetirizine (ZYRTEC) 10 MG tablet Take 10 mg by mouth daily.        . citalopram (CELEXA) 20 MG tablet Take 20 mg by mouth daily.        . ferrous sulfate 325 (65 FE) MG tablet Take 325 mg by mouth at bedtime.       . Fluticasone-Salmeterol (ADVAIR DISKUS) 250-50 MCG/DOSE AEPB Inhale 1 puff into the lungs every 12 (twelve) hours.        . hydrochlorothiazide 25 MG tablet Take 25 mg by mouth daily.        . isosorbide mononitrate (IMDUR) 60 MG 24 hr tablet Take 90 mg by mouth daily.      Marland Kitchen levothyroxine (SYNTHROID, LEVOTHROID) 125 MCG tablet Take 125 mcg by mouth daily.        . mometasone (NASONEX) 50 MCG/ACT nasal spray Place 2 sprays into the nose daily.  17 g  2  . Multiple Vitamin (MULTIVITAMIN) tablet Take 1 tablet by mouth daily.        . nitroGLYCERIN (NITROSTAT) 0.4 MG SL tablet  Place 0.4 mg under the tongue every 5 (five) minutes as needed.        . Omega-3 Fatty Acids (FISH OIL) 1200 MG CAPS Take 2 capsules by mouth 2 (two) times daily.        . ranitidine (ZANTAC) 150 MG tablet Take 150 mg by mouth 2 (two) times daily.       . Tamsulosin HCl (FLOMAX) 0.4 MG CAPS Take 0.4 mg by mouth daily.        . theophylline (THEOPHYLLINE) 100 MG 12 hr tablet Take 1 tablet (100 mg total) by mouth 2 (two) times daily.  60 tablet  2  . tiotropium (SPIRIVA) 18 MCG inhalation capsule Place 1 capsule (18 mcg total) into inhaler and inhale daily.  30 capsule  5  . traZODone (DESYREL) 100 MG tablet Take 100 mg by mouth at bedtime as needed.         Past Medical History  Diagnosis Date  . Hyperthyroidism 1978    RAI  . Shingles 2001  . Heart attack   .  Vitreous hemorrhage   . PVD (peripheral vascular disease)   . Other symptoms involving cardiovascular system   . Other and unspecified hyperlipidemia   . Unspecified essential hypertension   . Allergic rhinitis, cause unspecified   . Chronic airway obstruction, not elsewhere classified   . History of asbestos exposure   . Left renal mass   . Tobacco abuse   . Prostate cancer     xrt/IMRT 2008  . Renal cancer     cryoablation in 2012  . Coronary atherosclerosis of native coronary artery     stent x 3-one drug eluting  . Depression   . Anemia   . Peripheral arterial disease   . CAD (coronary artery disease)   . Carotid artery occlusion     Past Surgical History  Procedure Date  . Popliteal synovial cyst excision     knee  . Cholecystectomy   . Cervical disc surgery     fusion  . Partial knee arthroplasty     Right-replacement  . Nasal septum surgery     SMR  . Cardiac catheterization   . Coronary angioplasty     RCA   . Neck disk fusion 07/2002    History   Social History  . Marital Status: Widowed    Spouse Name: N/A    Number of Children: N/A  . Years of Education: N/A   Occupational History  .  Retired      [employer]   Social History Main Topics  . Smoking status: Current Everyday Smoker -- 0.2 packs/day for 60 years    Types: Cigarettes  . Smokeless tobacco: Never Used  . Alcohol Use: No  . Drug Use: No  . Sexually Active: Not on file   Other Topics Concern  . Not on file   Social History Narrative  . No narrative on file    Family History  Problem Relation Age of Onset  . Allergies    . Heart disease    . Cancer    . Coronary artery disease    . Coronary artery disease Brother   . Cancer Brother   . Heart disease Brother     ROS: no nausea, vomiting; no fever, chills; no melena, hematochezia; no claudication  PHYSICAL EXAM: BP 114/71  Pulse 79  Ht 5\' 8"  (1.727 m)  Wt 158 lb (71.668 kg)  BMI 24.02 kg/m2  SpO2 95% GENERAL: 78 year old male, sitting upright; NAD HEENT: NCAT, PERRLA, EOMI; sclera clear; no xanthelasma NECK: palpable bilateral carotid pulses, no bruits; no JVD; no TM LUNGS: CTA bilaterally CARDIAC: RRR (S1, S2); no significant murmurs; no rubs or gallops ABDOMEN: soft, non-tender; intact BS EXTREMETIES: intact distal pulses; no significant peripheral edema SKIN: warm/dry; no obvious rash/lesions MUSCULOSKELETAL: no joint deformity NEURO: no focal deficit; NL affect   EKG:    ASSESSMENT & PLAN:

## 2011-07-03 NOTE — Assessment & Plan Note (Addendum)
Recent carotid Dopplers indicated interval progression of bilateral disease, with 70-99% bilateral ICA stenosis (R. greater than left). A followup CT angiogram of the neck has been scheduled.

## 2011-07-03 NOTE — Assessment & Plan Note (Signed)
Patient encouraged to stop smoking altogether. He has significantly decreased his smoking, since his recent hospitalization, and appears motivated to continue doing so.

## 2011-07-03 NOTE — Assessment & Plan Note (Signed)
Well-controlled on current medication regimen 

## 2011-07-03 NOTE — Assessment & Plan Note (Signed)
Aggressive management recommended with target LDL 70 or less, if feasible. 

## 2011-07-03 NOTE — Patient Instructions (Signed)
Your physician wants you to follow-up in: 6 months. You will receive a reminder letter in the mail one-two months in advance. If you don't receive a letter, please call our office to schedule the follow-up appointment. Your physician recommends that you continue on your current medications as directed. Please refer to the Current Medication list given to you today. 

## 2011-07-03 NOTE — Assessment & Plan Note (Signed)
Continue current medication regimen, including recent increased dose of Imdur. Patient now reporting no recurrent CP. We'll plan clinic followup in 6 months, at which time he we'll reestablish with Dr. Andee Lineman.

## 2011-07-21 DIAGNOSIS — H903 Sensorineural hearing loss, bilateral: Secondary | ICD-10-CM | POA: Diagnosis not present

## 2011-07-27 DIAGNOSIS — Z01812 Encounter for preprocedural laboratory examination: Secondary | ICD-10-CM | POA: Diagnosis not present

## 2011-07-27 DIAGNOSIS — I6529 Occlusion and stenosis of unspecified carotid artery: Secondary | ICD-10-CM | POA: Diagnosis not present

## 2011-08-03 ENCOUNTER — Encounter: Payer: Self-pay | Admitting: Neurosurgery

## 2011-08-04 ENCOUNTER — Ambulatory Visit: Payer: Medicare Other | Admitting: Internal Medicine

## 2011-08-04 ENCOUNTER — Ambulatory Visit
Admission: RE | Admit: 2011-08-04 | Discharge: 2011-08-04 | Disposition: A | Discharge status: Home / Self Care | Payer: Medicare Other | Source: Ambulatory Visit | Attending: Vascular Surgery | Admitting: Vascular Surgery

## 2011-08-04 ENCOUNTER — Encounter: Payer: Self-pay | Admitting: Neurosurgery

## 2011-08-04 ENCOUNTER — Telehealth: Payer: Self-pay | Admitting: Cardiology

## 2011-08-04 ENCOUNTER — Ambulatory Visit (INDEPENDENT_AMBULATORY_CARE_PROVIDER_SITE_OTHER): Payer: Medicare Other | Admitting: Neurosurgery

## 2011-08-04 VITALS — BP 132/71 | HR 89 | Resp 16 | Ht 68.0 in | Wt 159.0 lb

## 2011-08-04 DIAGNOSIS — I6529 Occlusion and stenosis of unspecified carotid artery: Secondary | ICD-10-CM | POA: Diagnosis not present

## 2011-08-04 DIAGNOSIS — I6523 Occlusion and stenosis of bilateral carotid arteries: Secondary | ICD-10-CM | POA: Insufficient documentation

## 2011-08-04 DIAGNOSIS — Z01810 Encounter for preprocedural cardiovascular examination: Secondary | ICD-10-CM

## 2011-08-04 MED ORDER — IOHEXOL 350 MG/ML SOLN
80.0000 mL | Freq: Once | INTRAVENOUS | Status: AC | PRN
  Administered 2011-08-04: 80 mL via INTRAVENOUS

## 2011-08-04 NOTE — Progress Notes (Signed)
[facility] [place] HISTORY AND PHYSICAL   CC: Patient of Dr. Nicky Pugh followed today for carotid stenosis. This was his 6 month followup Referring Physician: Imogene Burn  History of Present Illness: 78 year old male patient of Dr. Imogene Burn seen for routine carotid stenosis surveillance. Patient seen with his daughter today, the patient had a hospitalization in February for a cardiac event, MI was ruled out. The patient was hospitalized at [facility] in [place] and while he was there he had some syncopal episodes so they decided to do his carotid duplex while he was hospitalized. The patient also had a CTA this morning a [facility].  Past Medical History  Diagnosis Date  . Hyperthyroidism 1978    RAI  . Shingles 2001  . Heart attack   . Vitreous hemorrhage   . PVD (peripheral vascular disease)   . Other symptoms involving cardiovascular system   . Other and unspecified hyperlipidemia   . Unspecified essential hypertension   . Allergic rhinitis, cause unspecified   . Chronic airway obstruction, not elsewhere classified   . History of asbestos exposure   . Left renal mass   . Tobacco abuse   . Prostate cancer     xrt/IMRT 2008  . Renal cancer     cryoablation in 2012  . Coronary atherosclerosis of native coronary artery     stent x 3-one drug eluting  . Depression   . Anemia   . Peripheral arterial disease   . CAD (coronary artery disease)   . Carotid artery occlusion   . Stroke     ROS: [x]  Positive   [ ]  Denies    General: [ ]  Weight loss, [ ]  Fever, [ ]  chills Neurologic: [ ]  Dizziness, [ ]  Blackouts, [ ]  Seizure [ ]  Stroke, [ ]  "Mini stroke", [ ]  Slurred speech, [ ]  Temporary blindness; [ ]  weakness in arms or legs, [ ]  Hoarseness Cardiac: [ ]  Chest pain/pressure, [ ]  Shortness of breath at rest [ ]  Shortness of breath with exertion, [ ]  Atrial fibrillation or irregular heartbeat Vascular: [ ]  Pain in legs with walking, [ ]  Pain in legs at rest, [ ]   Pain in legs at night,  [ ]  Non-healing ulcer, [ ]  Blood clot in vein/DVT,   Pulmonary: [ ]  Home oxygen, [ ]  Productive cough, [ ]  Coughing up blood, [ ]  Asthma,  [ ]  Wheezing Musculoskeletal:  [ ]  Arthritis, [ ]  Low back pain, [ ]  Joint pain Hematologic: [ ]  Easy Bruising, [ ]  Anemia; [ ]  Hepatitis Gastrointestinal: [ ]  Blood in stool, [ ]  Gastroesophageal Reflux/heartburn, [ ]  Trouble swallowing Urinary: [ ]  chronic Kidney disease, [ ]  on HD - [ ]  MWF or [ ]  TTHS, [ ]  Burning with urination, [ ]  Difficulty urinating Skin: [ ]  Rashes, [ ]  Wounds Psychological: [ ]  Anxiety, [ ]  Depression   Social History History  Substance Use Topics  . Smoking status: Current Everyday Smoker -- 0.2 packs/day for 60 years    Types: Cigarettes  . Smokeless tobacco: Never Used  . Alcohol Use: No    Family History Family History  Problem Relation Age of Onset  . Allergies    . Heart disease    . Cancer    . Coronary artery disease    . Coronary artery disease Brother   . Cancer Brother   . Heart disease Brother     Allergies  Allergen Reactions  . Ciprofloxacin     REACTION:  nausea  . Hydrocodone-Acetaminophen   . Iron Itching  . Lyrica (Pregabalin)   . Morphine And Related   . Oxycodone     hives  . Penicillins     REACTION: hives    Current Outpatient Prescriptions  Medication Sig Dispense Refill  . albuterol (PROAIR HFA) 108 (90 BASE) MCG/ACT inhaler Inhale 2 puffs into the lungs every 6 (six) hours as needed.       Marland Kitchen amLODipine (NORVASC) 10 MG tablet Take 10 mg by mouth daily.        Marland Kitchen aspirin 81 MG tablet Take 81 mg by mouth daily.        Marland Kitchen atorvastatin (LIPITOR) 40 MG tablet Take 40 mg by mouth daily.        . cetirizine (ZYRTEC) 10 MG tablet Take 10 mg by mouth daily.        . citalopram (CELEXA) 20 MG tablet Take 20 mg by mouth daily.        . ferrous sulfate 325 (65 FE) MG tablet Take 325 mg by mouth at bedtime.       . Fluticasone-Salmeterol (ADVAIR DISKUS) 250-50  MCG/DOSE AEPB Inhale 1 puff into the lungs every 12 (twelve) hours.        . hydrochlorothiazide 25 MG tablet Take 25 mg by mouth daily.        . isosorbide mononitrate (IMDUR) 60 MG 24 hr tablet Take 90 mg by mouth daily.      Marland Kitchen levothyroxine (SYNTHROID, LEVOTHROID) 125 MCG tablet Take 125 mcg by mouth daily.        . mometasone (NASONEX) 50 MCG/ACT nasal spray Place 2 sprays into the nose daily.  17 g  2  . Multiple Vitamin (MULTIVITAMIN) tablet Take 1 tablet by mouth daily.        . nitroGLYCERIN (NITROSTAT) 0.4 MG SL tablet Place 0.4 mg under the tongue every 5 (five) minutes as needed.        . Omega-3 Fatty Acids (FISH OIL) 1200 MG CAPS Take 2 capsules by mouth 2 (two) times daily.        . ranitidine (ZANTAC) 150 MG tablet Take 150 mg by mouth 2 (two) times daily.       . Tamsulosin HCl (FLOMAX) 0.4 MG CAPS Take 0.4 mg by mouth daily.        . theophylline (THEOPHYLLINE) 100 MG 12 hr tablet Take 1 tablet (100 mg total) by mouth 2 (two) times daily.  60 tablet  2  . tiotropium (SPIRIVA) 18 MCG inhalation capsule Place 1 capsule (18 mcg total) into inhaler and inhale daily.  30 capsule  5  . traZODone (DESYREL) 100 MG tablet Take 100 mg by mouth at bedtime as needed.        No current facility-administered medications for this visit.   Facility-Administered Medications Ordered in Other Visits  Medication Dose Route Frequency Provider Last Rate Last Dose  . iohexol (OMNIPAQUE) 350 MG/ML injection 80 mL  80 mL Intravenous Once PRN Harold L. Margo Aye, MD   80 mL at 08/04/11 0952    Physical Examination  Filed Vitals:   08/04/11 1026  BP: 132/71  Pulse: 89  Resp: 16    Body mass index is 24.18 kg/(m^2).  General:  WDWN in NAD Gait: Normal HEENT: WNL Eyes: Pupils equal Pulmonary: normal non-labored breathing , without Rales, rhonchi,  wheezing Cardiac: RRR, without  Murmurs, rubs or gallops; Abdomen: soft, NT, no masses Skin: no rashes, ulcers noted  Vascular  Exam Pulses:  Patient has 2+ radial pulses, 1+ PT and DP bilaterally Carotid bruits not auscultated although the patient does have significant blockage bilaterally Extremities without ischemic changes, no Gangrene , no cellulitis; no open wounds;  Musculoskeletal: no muscle wasting or atrophy   Neurologic: A&O X 3; Appropriate Affect ; SENSATION: normal; MOTOR FUNCTION:  moving all extremities equally. Speech is fluent/normal  Non-Invasive Vascular Imaging CAROTID DUPLEX 08/04/2011  Right ICA 60 - 79 % stenosis Left ICA 60 - 79 % stenosis   ASSESSMENT/PLAN: Dr. Imogene Burn spoke with the patient and his daughter at length regarding the need for probable left-sided carotid endarterectomy however the patient does need cardiac clearance and will followup with his cardiologist that'll Nechama Guard in the next week and return to see Dr. Imogene Burn 2 weeks and today to discuss surgical plan. Imogene Burn did explain to the patient and his daughter that per CTA and the last duplex done at [facility] shows a significant increase however due to calcification in the vessels the duplex numbers may be falsely elevated. Their questions were encouraged and answered they will followup here in 2 weeks after the cardiac clearance.  Lauree Chandler ANP  Clinic M.D.: Imogene Burn

## 2011-08-04 NOTE — Telephone Encounter (Signed)
Dr. Imogene Burn needs cardiac clearance for Michael [name] to have left side carotid endarterectomy.  He was seen here in March 2013 by Rozell Searing, PA -C.  Can this be cleared or does patient need another office visit? Please call Jared at Dr. Nicky Pugh office  [phone].

## 2011-08-07 ENCOUNTER — Other Ambulatory Visit: Payer: Self-pay | Admitting: Vascular Surgery

## 2011-08-08 NOTE — Telephone Encounter (Signed)
Yes the  patient can be cleared for carotid surgeryfrom a cardiovascular standpoint.  I will forward this to Gene and  Document a formal note

## 2011-08-22 ENCOUNTER — Ambulatory Visit (HOSPITAL_COMMUNITY)
Admission: RE | Admit: 2011-08-22 | Discharge: 2011-08-22 | Disposition: A | Discharge status: Home / Self Care | Payer: Medicare Other | Source: Ambulatory Visit | Attending: Urology | Admitting: Urology

## 2011-08-22 ENCOUNTER — Other Ambulatory Visit (HOSPITAL_COMMUNITY): Payer: Self-pay | Admitting: Urology

## 2011-08-22 DIAGNOSIS — D4959 Neoplasm of unspecified behavior of other genitourinary organ: Secondary | ICD-10-CM | POA: Insufficient documentation

## 2011-08-22 DIAGNOSIS — Z981 Arthrodesis status: Secondary | ICD-10-CM | POA: Diagnosis not present

## 2011-08-22 DIAGNOSIS — C61 Malignant neoplasm of prostate: Secondary | ICD-10-CM | POA: Diagnosis not present

## 2011-08-22 DIAGNOSIS — J449 Chronic obstructive pulmonary disease, unspecified: Secondary | ICD-10-CM | POA: Diagnosis not present

## 2011-08-22 DIAGNOSIS — J4489 Other specified chronic obstructive pulmonary disease: Secondary | ICD-10-CM | POA: Diagnosis not present

## 2011-08-22 DIAGNOSIS — F17200 Nicotine dependence, unspecified, uncomplicated: Secondary | ICD-10-CM | POA: Insufficient documentation

## 2011-08-23 ENCOUNTER — Other Ambulatory Visit: Payer: Self-pay | Admitting: *Deleted

## 2011-08-23 MED ORDER — NITROGLYCERIN 0.4 MG SL SUBL: 0.4000 mg | SUBLINGUAL_TABLET | SUBLINGUAL | Status: DC | PRN

## 2011-08-24 ENCOUNTER — Ambulatory Visit (INDEPENDENT_AMBULATORY_CARE_PROVIDER_SITE_OTHER): Payer: Medicare Other | Admitting: Internal Medicine

## 2011-08-24 ENCOUNTER — Encounter: Payer: Self-pay | Admitting: Internal Medicine

## 2011-08-24 ENCOUNTER — Encounter: Payer: Self-pay | Admitting: Vascular Surgery

## 2011-08-24 VITALS — BP 112/62 | HR 84 | Ht 68.0 in | Wt 159.0 lb

## 2011-08-24 DIAGNOSIS — F17200 Nicotine dependence, unspecified, uncomplicated: Secondary | ICD-10-CM

## 2011-08-24 DIAGNOSIS — J449 Chronic obstructive pulmonary disease, unspecified: Secondary | ICD-10-CM

## 2011-08-24 DIAGNOSIS — Z77090 Contact with and (suspected) exposure to asbestos: Secondary | ICD-10-CM

## 2011-08-24 DIAGNOSIS — J309 Allergic rhinitis, unspecified: Secondary | ICD-10-CM | POA: Diagnosis not present

## 2011-08-24 DIAGNOSIS — E782 Mixed hyperlipidemia: Secondary | ICD-10-CM | POA: Diagnosis not present

## 2011-08-24 DIAGNOSIS — J4489 Other specified chronic obstructive pulmonary disease: Secondary | ICD-10-CM

## 2011-08-24 NOTE — Progress Notes (Signed)
Patient ID: Michael [name], male    DOB: 07-15-1931, 78 y.o.   MRN: [id number] PCP Dr Leandrew Koyanagi HPI 08/14/00- 78 yo M current smoker, coming for f/u of COPD and allergic rhinitis. Had pneumonia about a month ago and brings CXR from 07/18/10 done by [name], showing COPD but NAD on my screen. . Still smokes 1 Pack/ 3days.  Admits daily morning cough, usually clear or slight yellow sputum but no blood. Admits some SOB, but exercises 3 days/ week at rehab center. Wheezes some. Continues Advair, Spiriva. Proair doesn't help a lot. Had cardiac evaluation/ stress test by cardiologist- "ok".  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 He brings a newsletter from [employer] and from [place] and [place], and from them he asks about CT to assess for asbestos related changes.   09/23/10 Smoker with hx asbestos exposure, COPD, allergic rhinitis. Newly dx'd renal cancer- to see [name]. Daughter here He notes a little cough occasionallly , not much. Denies chest pain, palpitation.He has not really tried to stop smoking. I reviewed his CT abd which included some lung level views and shared the images with them today. No lung abnormality described, but there is significant atherosclerosis.   02/03/11- Smoker with hx asbestos exposure, COPD, allergic rhinitis. Newly dx'd renal cancer  .. family here Had cryosurgery for his left renal cancer.- so far so good. Has had flu vaccine. Had chest x-ray since last here. Chest feels better with theophylline which was well tolerated. He still smokes one half pack per day we discussed trying again. We suggested nicotine replacement approaches since I notice he is chewing gum today.  08/24/11- 78 yoM Smoker with hx asbestos exposure, COPD, allergic rhinitis.  renal cancer  .. daughter here   PCP [name]. [name] Still smoking one quarter pack per day. Blames pollen for head congestion and sneeze. Admits gradually worsening dyspnea  on exertion. Coughs more in drafts or after exertion. Daily cough productive of white or yellow sputum with no blood and no chest pain. Spiriva continues to be helpful. Has 80% left carotid lesion, pending evaluation with Dr. Imogene Burn. Hx bilateral TKR, but does exercise on bike and treadmill. CXR- 08/23/11 CHEST - 2 VIEW  Comparison: 03/03/2011  Findings: COPD. Pulmonary hyperinflation is present with mild  scarring. Negative for pneumonia or mass lesion. Negative for  heart failure or effusion. Surgical fusion is present.  IMPRESSION:  COPD. No acute cardiopulmonary disease.  Original Report Authenticated By: Camelia Phenes, M.D.   Review of Systems-See HPI Constitutional:   No-   weight loss, night sweats, fevers, chills, fatigue, lassitude. HEENT:   No-  headaches, difficulty swallowing, tooth/dental problems, sore throat,       No-  sneezing, itching, ear ache,  +nasal congestion, post nasal drip,  CV:  No-   chest pain, orthopnea, PND, swelling in lower extremities, anasarca, dizziness, palpitations Resp: + shortness of breath with exertion or at rest.              + productive cough, little non-productive cough,  No-  coughing up of blood.              No-   change in color of mucus.  No- wheezing.   Skin: No-   rash or lesions. GI:  No-   heartburn, indigestion, abdominal pain, nausea, vomiting,  GU:  MS:  No-   joint pain or swelling.  Neuro- grossly  normal to observation, Or:  Psych:  No- change in mood or affect. No depression or anxiety.  No memory loss.   OBJ General- Alert, Oriented, Affect-appropriate, Distress- none acute; normal weight Skin- rash-none, lesions- none, excoriation- none Lymphadenopathy- none Head- atraumatic            Eyes- Gross vision intact, PERRLA, conjunctivae clear secretions            Ears- Hearing, canals-normal            Nose- Clear, no-Septal dev, mucus, polyps, erosion, perforation             Throat- Mallampati II , mucosa clear ,  drainage- none, tonsils- atrophic Neck- flexible , trachea midline, no stridor , thyroid nl, carotid no bruit audible by me today Chest - symmetrical excursion , unlabored           Heart/CV- RRR , no murmur , no gallop  , no rub, nl s1 s2                           - JVD- none , edema- none, stasis changes- none, varices- none           Lung- decreased breath sounds, unlabored, wheeze + trace, slight dry cough , dullness-none, rub- none           Chest wall-  Abd-  Br/ Gen/ Rectal- Not done, not indicated Extrem- cyanosis- none, clubbing, none, atrophy- none, strength- nl Neuro- grossly intact to observation

## 2011-08-24 NOTE — Patient Instructions (Signed)
Continue present meds  Ok to take an otc antihistamine like claritin or allegra if needed for allergy

## 2011-08-25 ENCOUNTER — Encounter: Payer: Self-pay | Admitting: Vascular Surgery

## 2011-08-25 ENCOUNTER — Ambulatory Visit (INDEPENDENT_AMBULATORY_CARE_PROVIDER_SITE_OTHER): Payer: Medicare Other | Admitting: Vascular Surgery

## 2011-08-25 VITALS — BP 125/68 | HR 81 | Temp 98.0°F | Ht 68.0 in | Wt 158.0 lb

## 2011-08-25 DIAGNOSIS — I6529 Occlusion and stenosis of unspecified carotid artery: Secondary | ICD-10-CM

## 2011-08-25 NOTE — Progress Notes (Signed)
[facility] [place]  Established Carotid Patient  History of Present Illness  Michael [name] is a 78 y.o. (01/02/1932) male who presents with chief complaint: follow up on cardiac optimization.  Pt's CTA neck demonstrated: RICA 70-75%, L ICA 75-80%.  Patient has no history of TIA or stroke symptom.   The pt was cleared for L CEA by Dr. Guy de Gent.  Past Medical History  Diagnosis Date  . Hyperthyroidism 1978    RAI  . Shingles 2001  . Heart attack   . Vitreous hemorrhage   . PVD (peripheral vascular disease)   . Other symptoms involving cardiovascular system   . Other and unspecified hyperlipidemia   . Unspecified essential hypertension   . Allergic rhinitis, cause unspecified   . Chronic airway obstruction, not elsewhere classified   . History of asbestos exposure   . Left renal mass   . Tobacco abuse   . Prostate cancer     xrt/IMRT 2008  . Renal cancer     cryoablation in 2012  . Coronary atherosclerosis of native coronary artery     stent x 3-one drug eluting  . Depression   . Anemia   . Peripheral arterial disease   . CAD (coronary artery disease)   . Carotid artery occlusion   . Stroke     Past Surgical History  Procedure Date  . Popliteal synovial cyst excision     knee  . Cholecystectomy   . Cervical disc surgery     fusion  . Partial knee arthroplasty     Right-replacement  . Nasal septum surgery     SMR  . Cardiac catheterization   . Coronary angioplasty     RCA   . Neck disk fusion 07/2002    History   Social History  . Marital Status: Widowed    Spouse Name: N/A    Number of Children: N/A  . Years of Education: N/A   Occupational History  . Retired      [employer]   Social History Main Topics  . Smoking status: Current Everyday Smoker -- 0.2 packs/day for 60 years    Types: Cigarettes  . Smokeless tobacco: Never Used  . Alcohol Use: No  . Drug Use: No  . Sexually Active: Not on file   Other  Topics Concern  . Not on file   Social History Narrative  . No narrative on file    Family History  Problem Relation Age of Onset  . Allergies    . Heart disease    . Cancer    . Coronary artery disease    . Coronary artery disease Brother   . Cancer Brother   . Heart disease Brother     Current Outpatient Prescriptions on File Prior to Visit  Medication Sig Dispense Refill  . albuterol (PROAIR HFA) 108 (90 BASE) MCG/ACT inhaler Inhale 2 puffs into the lungs every 6 (six) hours as needed.       . amLODipine (NORVASC) 10 MG tablet Take 10 mg by mouth daily.        . aspirin 81 MG tablet Take 81 mg by mouth daily.        . atorvastatin (LIPITOR) 40 MG tablet Take 40 mg by mouth daily.        . cetirizine (ZYRTEC) 10 MG tablet Take 10 mg by mouth daily.        . citalopram (CELEXA) 20 MG tablet Take 20 mg by mouth   daily.        . ferrous sulfate 325 (65 FE) MG tablet Take 325 mg by mouth at bedtime.       . Fluticasone-Salmeterol (ADVAIR DISKUS) 250-50 MCG/DOSE AEPB Inhale 1 puff into the lungs every 12 (twelve) hours.        . hydrochlorothiazide 25 MG tablet Take 25 mg by mouth daily.        . isosorbide mononitrate (IMDUR) 60 MG 24 hr tablet Take 90 mg by mouth daily.      . levothyroxine (SYNTHROID, LEVOTHROID) 125 MCG tablet Take 125 mcg by mouth daily.        . mometasone (NASONEX) 50 MCG/ACT nasal spray Place 2 sprays into the nose daily.  17 g  2  . Multiple Vitamin (MULTIVITAMIN) tablet Take 1 tablet by mouth daily.        . nitroGLYCERIN (NITROSTAT) 0.4 MG SL tablet Place 1 tablet (0.4 mg total) under the tongue every 5 (five) minutes as needed.  25 tablet  2  . Omega-3 Fatty Acids (FISH OIL) 1200 MG CAPS Take 2 capsules by mouth 2 (two) times daily.        . ranitidine (ZANTAC) 150 MG tablet Take 150 mg by mouth 2 (two) times daily.       . Tamsulosin HCl (FLOMAX) 0.4 MG CAPS Take 0.4 mg by mouth daily.        . theophylline (THEODUR) 200 MG 12 hr tablet Take 100 mg by  mouth 2 (two) times daily.      . tiotropium (SPIRIVA) 18 MCG inhalation capsule Place 1 capsule (18 mcg total) into inhaler and inhale daily.  30 capsule  5  . traZODone (DESYREL) 100 MG tablet Take 100 mg by mouth at bedtime as needed.       . theophylline (THEOPHYLLINE) 100 MG 12 hr tablet Take 1 tablet (100 mg total) by mouth 2 (two) times daily.  60 tablet  2    Allergies  Allergen Reactions  . Ciprofloxacin     REACTION: nausea  . Hydrocodone-Acetaminophen   . Iron Itching  . Lyrica (Pregabalin)   . Morphine And Related   . Oxycodone     hives  . Penicillins     REACTION: hives    Review of Systems (Positive items checked otherwise negative)  General: [ ] Weight loss, [ ] Weight gain, [ ]  Loss of appetite, [ ] Fever  Neurologic: [ ] Dizziness, [ ] Blackouts, [ ] Headaches, [ ] Seizure  Ear/Nose/Throat: [ ] Change in eyesight, [ ] Change in hearing, [ ] Nose bleeds, [ ] Sore throat  Vascular: [ ] Pain in legs with walking, [ ] Pain in feet while lying flat, [ ] Non-healing ulcer, Stroke, [ ] "Mini stroke", [ ] Slurred speech, [ ] Temporary blindness, [ ] Blood clot in vein, [ ] Phlebitis  Pulmonary: [ ] Home oxygen, [ ] Productive cough, [ ] Bronchitis, [ ] Coughing up blood, [ ] Asthma, [ ] Wheezing, [x] COPD  Musculoskeletal: [ ] Arthritis, [x] Joint pain, [ ] Muscle pain  Cardiac: [ ] Chest pain, [ ] Chest tightness/pressure, [ ] Shortness of breath when lying flat, [x] Shortness of breath with exertion, [ ] Palpitations, [ ] Heart murmur, [ ] Arrythmia,  [ ] Atrial fibrillation  Hematologic: [ ] Bleeding problems, [ ] Clotting disorder, [ ] Anemia  Psychiatric:  [ ] Depression, [ ] Anxiety, [ ] Attention deficit disorder    Gastrointestinal:  [ ] Black stool,[ ]  Blood in stool, [ ] Peptic ulcer disease, [ ] Reflux, [ ] Hiatal hernia, [ ] Trouble swallowing, [ ] Diarrhea, [ ] Constipation  Urinary:  [ ] Kidney disease, [ ] Burning with urination, [ ] Frequent  urination, [ ] Difficulty urinating  Skin: [ ] Ulcers, [ ] Rashes  Physical Examination  Filed Vitals:   08/25/11 1606  BP: 125/68  Pulse: 81  Temp: 98 F (36.7 C)  TempSrc: Oral  Height: 5' 8" (1.727 m)  Weight: 158 lb (71.668 kg)   Body mass index is 24.02 kg/(m^2).  General: A&O x 3, WDWN  Neck: tranverse incision right neck  Eyes: PERRLA, EOMI  Pulmonary: Sym exp, good air movt, CTAB, no rales, rhonchi, & wheezing  Cardiac: RRR, Nl S1, S2, no Murmurs, rubs or gallops  Vascular: Vessel Right Left  Radial Palpable Palpable  Brachial Palpable Palpable  Carotid Palpable, without bruit Palpable, without bruit  Aorta Non-palpable N/A  Femoral Palpable Palpable  Popliteal Non-palpable Non-palpable  PT Palpable Palpable  DP Palpable Palpable   Gastrointestinal: soft, NTND, -G/R, - HSM, - masses, - CVAT B  Musculoskeletal: M/S 5/5 throughout , Extremities without ischemic changes   Neurologic: CN 2-12 intact , Pain and light touch intact in extremities , Motor exam as listed above  CTA Neck (08/04/11)  IMPRESSION:  1. Bulky calcified plaque at both carotid bifurcations resulting  in high-grade stenosis by CTA:  - 70-75% right ICA origin stenosis.  - 75-80% left ICA origin stenosis.  2. No atherosclerosis of the vertebral arteries. Mild atherosclerosis of the great vessels but no great vessel origin stenosis.  Based on my interpretation of the CTA, he has significant calcific atherosclerotic plaque at both carotid bifurcation.  I have concern that some segments of the LICA are >80% stenoses.  The RICA appears to be <80% currently.    Medical Decision Making  Tandy L Gongora is a 78 y.o. male who presents with: Asx B ICA stenosis: L > 80%, R < 80%   Based on the patient's vascular studies and examination, I have offered the patient: L CEA.  Given his significant calcification I think carotid stent is relatively contraindicated.  He has been cleared for carotid  surgery and optimized by his cardiologist and pulmonologist. I discussed with the patient the risks, benefits, and alternatives to carotid endarterectomy.   I discussed the procedural details of carotid endarterectomy with the patient.  The patient is aware that the risks of carotid endarterectomy include but are not limited to: bleeding, infection, stroke, myocardial infarction, death, cranial nerve injuries both temporary and permanent, neck hematoma, possible airway compromise, labile blood pressure post-operatively, cerebral hyperperfusion syndrome, and possible need for additional interventions in the future.  The patient is aware of the risks and agrees to proceed forward with the procedure.  This is being scheduled for the 1st May 2013.  I discussed in depth with the patient the nature of atherosclerosis, and emphasized the importance of maximal medical management including strict control of blood pressure, blood glucose, and lipid levels, antiplatelet agents, obtaining regular exercise, and cessation of smoking.  The patient is aware that without maximal medical management the underlying atherosclerotic disease process will progress, limiting the benefit of any interventions.  Thank you for allowing us to participate in this patient's care.  Manases Etchison, MD [facility] [place] Office: [phone] Pager: [phone]  08/25/2011, 4:53 PM     

## 2011-08-27 NOTE — Assessment & Plan Note (Signed)
No associated progressive disease seen so far.

## 2011-08-27 NOTE — Assessment & Plan Note (Signed)
Some exacerbation of seasonal allergic rhinitis.

## 2011-08-27 NOTE — Assessment & Plan Note (Signed)
I have outlined cessation support.

## 2011-08-27 NOTE — Assessment & Plan Note (Signed)
He realizes continued smoking will only make his dyspnea on exertion worse or it we talked about smoking cessation choices again today.

## 2011-08-28 ENCOUNTER — Other Ambulatory Visit: Payer: Self-pay | Admitting: *Deleted

## 2011-08-28 ENCOUNTER — Encounter (HOSPITAL_COMMUNITY): Payer: Self-pay

## 2011-08-29 ENCOUNTER — Encounter (HOSPITAL_COMMUNITY): Payer: Self-pay

## 2011-08-29 ENCOUNTER — Encounter (HOSPITAL_COMMUNITY)
Admission: RE | Admit: 2011-08-29 | Discharge: 2011-08-29 | Disposition: A | Discharge status: Home / Self Care | Payer: Medicare Other | Source: Ambulatory Visit | Attending: Vascular Surgery | Admitting: Vascular Surgery

## 2011-08-29 HISTORY — DX: Hypothyroidism, unspecified: E03.9

## 2011-08-29 LAB — CBC
HCT: 37.3 % — ABNORMAL LOW (ref 39.0–52.0)
MCHC: 35.4 g/dL (ref 30.0–36.0)
MCV: 92.6 fL (ref 78.0–100.0)
Platelets: 221 10*3/uL (ref 150–400)
RDW: 14.2 % (ref 11.5–15.5)

## 2011-08-29 LAB — URINALYSIS, ROUTINE W REFLEX MICROSCOPIC
Hgb urine dipstick: NEGATIVE
Leukocytes, UA: NEGATIVE
Nitrite: NEGATIVE
Protein, ur: NEGATIVE mg/dL
Urobilinogen, UA: 0.2 mg/dL (ref 0.0–1.0)

## 2011-08-29 LAB — COMPREHENSIVE METABOLIC PANEL
Albumin: 3.6 g/dL (ref 3.5–5.2)
BUN: 11 mg/dL (ref 6–23)
Calcium: 9.3 mg/dL (ref 8.4–10.5)
Creatinine, Ser: 1.09 mg/dL (ref 0.50–1.35)
Total Protein: 6.5 g/dL (ref 6.0–8.3)

## 2011-08-29 LAB — APTT: aPTT: 32 seconds (ref 24–37)

## 2011-08-29 LAB — PROTIME-INR: INR: 0.91 (ref 0.00–1.49)

## 2011-08-29 MED ORDER — VANCOMYCIN HCL IN DEXTROSE 1-5 GM/200ML-% IV SOLN
1000.0000 mg | INTRAVENOUS | Status: AC
  Administered 2011-08-30: 1000 mg via INTRAVENOUS
  Filled 2011-08-29: qty 200

## 2011-08-29 NOTE — Pre-Procedure Instructions (Signed)
20 Sayer L Siemen  08/29/2011   Your procedure is scheduled on:  Wednesday May 1  Report to [facility] at 8:30 AM.  Call this number if you have problems the morning of surgery: [phone]   Remember:   Do not eat food:After Midnight.  May have clear liquids: up to 4 Hours before arrival.  Clear liquids include soda, tea, black coffee, apple or grape juice, broth.  Take these medicines the morning of surgery with A SIP OF WATER: Theophyline, Norvasc, Zyrtec, Celexa, Imdur, Synthroid, Zantac. May use inhalers.    Do not wear jewelry, make-up or nail polish.  Do not wear lotions, powders, or perfumes. You may wear deodorant.  Do not shave 48 hours prior to surgery.  Do not bring valuables to the hospital.  Contacts, dentures or bridgework may not be worn into surgery.  Leave suitcase in the car. After surgery it may be brought to your room.  For patients admitted to the hospital, checkout time is 11:00 AM the day of discharge.   Patients discharged the day of surgery will not be allowed to drive home.  Name and phone number of your driver: NA  Special Instructions: CHG Shower Use Special Wash: 1/2 bottle night before surgery and 1/2 bottle morning of surgery.   Please read over the following fact sheets that you were given: Pain Booklet, Coughing and Deep Breathing, Blood Transfusion Information and Surgical Site Infection Prevention

## 2011-08-29 NOTE — Progress Notes (Addendum)
Requested records from pt's recent hospitalization at [facility] for chest pain. Per Dr. Tommi Rumps Gent's note, no stress test or cardiac cath was done during that admission, pt managed medically.  Cardiac cath done at [facility] 11/2010 Stress test at [facility] 05/30/10

## 2011-08-30 ENCOUNTER — Encounter (HOSPITAL_COMMUNITY): Payer: Self-pay | Admitting: *Deleted

## 2011-08-30 ENCOUNTER — Inpatient Hospital Stay (HOSPITAL_COMMUNITY)
Admission: RE | Admit: 2011-08-30 | Discharge: 2011-09-01 | DRG: 039 | Disposition: A | Discharge status: Home / Self Care | Payer: Medicare Other | Source: Ambulatory Visit | Attending: Vascular Surgery | Admitting: Vascular Surgery

## 2011-08-30 ENCOUNTER — Ambulatory Visit (HOSPITAL_COMMUNITY): Payer: Medicare Other | Admitting: Vascular Surgery

## 2011-08-30 ENCOUNTER — Encounter (HOSPITAL_COMMUNITY)
Admission: RE | Disposition: A | Discharge status: Home / Self Care | Payer: Self-pay | Source: Ambulatory Visit | Attending: Vascular Surgery

## 2011-08-30 ENCOUNTER — Telehealth: Payer: Self-pay | Admitting: Vascular Surgery

## 2011-08-30 ENCOUNTER — Encounter (HOSPITAL_COMMUNITY): Payer: Self-pay

## 2011-08-30 ENCOUNTER — Encounter (HOSPITAL_COMMUNITY): Payer: Self-pay | Admitting: Vascular Surgery

## 2011-08-30 DIAGNOSIS — J449 Chronic obstructive pulmonary disease, unspecified: Secondary | ICD-10-CM | POA: Diagnosis present

## 2011-08-30 DIAGNOSIS — I252 Old myocardial infarction: Secondary | ICD-10-CM

## 2011-08-30 DIAGNOSIS — Z8546 Personal history of malignant neoplasm of prostate: Secondary | ICD-10-CM

## 2011-08-30 DIAGNOSIS — F17200 Nicotine dependence, unspecified, uncomplicated: Secondary | ICD-10-CM | POA: Diagnosis present

## 2011-08-30 DIAGNOSIS — E0590 Thyrotoxicosis, unspecified without thyrotoxic crisis or storm: Secondary | ICD-10-CM | POA: Diagnosis present

## 2011-08-30 DIAGNOSIS — Z85528 Personal history of other malignant neoplasm of kidney: Secondary | ICD-10-CM

## 2011-08-30 DIAGNOSIS — Z01812 Encounter for preprocedural laboratory examination: Secondary | ICD-10-CM

## 2011-08-30 DIAGNOSIS — I658 Occlusion and stenosis of other precerebral arteries: Secondary | ICD-10-CM | POA: Diagnosis present

## 2011-08-30 DIAGNOSIS — I6789 Other cerebrovascular disease: Secondary | ICD-10-CM | POA: Diagnosis not present

## 2011-08-30 DIAGNOSIS — I4891 Unspecified atrial fibrillation: Secondary | ICD-10-CM | POA: Diagnosis present

## 2011-08-30 DIAGNOSIS — I2510 Atherosclerotic heart disease of native coronary artery without angina pectoris: Secondary | ICD-10-CM | POA: Diagnosis present

## 2011-08-30 DIAGNOSIS — Z77090 Contact with and (suspected) exposure to asbestos: Secondary | ICD-10-CM

## 2011-08-30 DIAGNOSIS — I6529 Occlusion and stenosis of unspecified carotid artery: Secondary | ICD-10-CM | POA: Diagnosis not present

## 2011-08-30 DIAGNOSIS — J4489 Other specified chronic obstructive pulmonary disease: Secondary | ICD-10-CM | POA: Diagnosis present

## 2011-08-30 DIAGNOSIS — C61 Malignant neoplasm of prostate: Secondary | ICD-10-CM | POA: Diagnosis not present

## 2011-08-30 HISTORY — PX: ENDARTERECTOMY: SHX5162

## 2011-08-30 LAB — TYPE AND SCREEN
ABO/RH(D): O POS
Antibody Screen: NEGATIVE

## 2011-08-30 LAB — CARDIAC PANEL(CRET KIN+CKTOT+MB+TROPI)
CK, MB: 2.3 ng/mL (ref 0.3–4.0)
Troponin I: <0.3 ng/mL (ref <0.30)

## 2011-08-30 SURGERY — ENDARTERECTOMY, CAROTID
Anesthesia: General | Site: Neck | Laterality: Left | Wound class: Clean

## 2011-08-30 MED ORDER — METOPROLOL TARTRATE 1 MG/ML IV SOLN: 2.0000 mg | INTRAVENOUS | Status: DC | PRN

## 2011-08-30 MED ORDER — ALUM & MAG HYDROXIDE-SIMETH 200-200-20 MG/5ML PO SUSP: 15.0000 mL | ORAL | Status: DC | PRN

## 2011-08-30 MED ORDER — TRAZODONE HCL 100 MG PO TABS
100.0000 mg | ORAL_TABLET | Freq: Every evening | ORAL | Status: DC | PRN
  Filled 2011-08-30: qty 1

## 2011-08-30 MED ORDER — THEOPHYLLINE ER 100 MG PO TB12
100.0000 mg | ORAL_TABLET | Freq: Two times a day (BID) | ORAL | Status: DC
  Administered 2011-08-30 – 2011-09-01 (×4): 100 mg via ORAL
  Filled 2011-08-30 (×5): qty 1

## 2011-08-30 MED ORDER — ATORVASTATIN CALCIUM 40 MG PO TABS
40.0000 mg | ORAL_TABLET | Freq: Every day | ORAL | Status: DC
  Administered 2011-08-30 – 2011-08-31 (×2): 40 mg via ORAL
  Filled 2011-08-30 (×3): qty 1

## 2011-08-30 MED ORDER — LABETALOL HCL 5 MG/ML IV SOLN
10.0000 mg | INTRAVENOUS | Status: DC | PRN
  Filled 2011-08-30: qty 4

## 2011-08-30 MED ORDER — ONDANSETRON HCL 4 MG/2ML IJ SOLN: 4.0000 mg | Freq: Four times a day (QID) | INTRAMUSCULAR | Status: DC | PRN

## 2011-08-30 MED ORDER — TRAMADOL HCL 50 MG PO TABS: 50.0000 mg | ORAL_TABLET | Freq: Four times a day (QID) | ORAL | Status: AC | PRN

## 2011-08-30 MED ORDER — GUAIFENESIN-DM 100-10 MG/5ML PO SYRP
15.0000 mL | ORAL_SOLUTION | ORAL | Status: DC | PRN
  Administered 2011-08-30: 15 mL via ORAL
  Filled 2011-08-30: qty 10
  Filled 2011-08-30: qty 5

## 2011-08-30 MED ORDER — AMLODIPINE BESYLATE 10 MG PO TABS
10.0000 mg | ORAL_TABLET | Freq: Every day | ORAL | Status: DC
  Filled 2011-08-30 (×2): qty 1

## 2011-08-30 MED ORDER — ASPIRIN EC 325 MG PO TBEC
325.0000 mg | DELAYED_RELEASE_TABLET | Freq: Every day | ORAL | Status: DC
  Administered 2011-08-31 – 2011-09-01 (×2): 325 mg via ORAL
  Filled 2011-08-30 (×2): qty 1

## 2011-08-30 MED ORDER — ACETAMINOPHEN 650 MG RE SUPP: 325.0000 mg | RECTAL | Status: DC | PRN

## 2011-08-30 MED ORDER — LORATADINE 10 MG PO TABS
10.0000 mg | ORAL_TABLET | Freq: Every day | ORAL | Status: DC
  Administered 2011-08-31 – 2011-09-01 (×2): 10 mg via ORAL
  Filled 2011-08-30 (×3): qty 1

## 2011-08-30 MED ORDER — LACTATED RINGERS IV SOLN
INTRAVENOUS | Status: DC
  Administered 2011-08-30: 12:00:00 via INTRAVENOUS

## 2011-08-30 MED ORDER — PHENYLEPHRINE HCL 10 MG/ML IJ SOLN
10.0000 mg | INTRAVENOUS | Status: DC | PRN
  Administered 2011-08-30: 40 ug/min via INTRAVENOUS

## 2011-08-30 MED ORDER — 0.9 % SODIUM CHLORIDE (POUR BTL) OPTIME
TOPICAL | Status: DC | PRN
  Administered 2011-08-30: 1000 mL

## 2011-08-30 MED ORDER — POTASSIUM CHLORIDE CRYS ER 20 MEQ PO TBCR: 20.0000 meq | EXTENDED_RELEASE_TABLET | Freq: Once | ORAL | Status: AC | PRN

## 2011-08-30 MED ORDER — MEPERIDINE HCL 25 MG/ML IJ SOLN: 6.2500 mg | INTRAMUSCULAR | Status: DC | PRN

## 2011-08-30 MED ORDER — ALBUTEROL SULFATE HFA 108 (90 BASE) MCG/ACT IN AERS: 2.0000 | INHALATION_SPRAY | Freq: Four times a day (QID) | RESPIRATORY_TRACT | Status: DC | PRN

## 2011-08-30 MED ORDER — EPHEDRINE SULFATE 50 MG/ML IJ SOLN
INTRAMUSCULAR | Status: DC | PRN
  Administered 2011-08-30: 5 mg via INTRAVENOUS

## 2011-08-30 MED ORDER — FENTANYL CITRATE 0.05 MG/ML IJ SOLN
INTRAMUSCULAR | Status: DC | PRN
  Administered 2011-08-30: 150 ug via INTRAVENOUS
  Administered 2011-08-30: 100 ug via INTRAVENOUS

## 2011-08-30 MED ORDER — ISOSORBIDE MONONITRATE ER 60 MG PO TB24
90.0000 mg | ORAL_TABLET | Freq: Every day | ORAL | Status: DC
  Filled 2011-08-30: qty 1

## 2011-08-30 MED ORDER — HYDROCHLOROTHIAZIDE 25 MG PO TABS
25.0000 mg | ORAL_TABLET | Freq: Every day | ORAL | Status: DC
  Filled 2011-08-30: qty 1

## 2011-08-30 MED ORDER — PROPOFOL 10 MG/ML IV EMUL
INTRAVENOUS | Status: DC | PRN
  Administered 2011-08-30: 50 mg via INTRAVENOUS
  Administered 2011-08-30: 150 mg via INTRAVENOUS

## 2011-08-30 MED ORDER — SODIUM CHLORIDE 0.9 % IR SOLN
Status: DC | PRN
  Administered 2011-08-30: 13:00:00

## 2011-08-30 MED ORDER — THROMBIN 20000 UNITS EX KIT
PACK | CUTANEOUS | Status: DC | PRN
  Administered 2011-08-30: 15:00:00 via TOPICAL

## 2011-08-30 MED ORDER — VANCOMYCIN HCL IN DEXTROSE 1-5 GM/200ML-% IV SOLN
1000.0000 mg | Freq: Two times a day (BID) | INTRAVENOUS | Status: AC
  Administered 2011-08-31: 1000 mg via INTRAVENOUS
  Filled 2011-08-30: qty 200

## 2011-08-30 MED ORDER — TIOTROPIUM BROMIDE MONOHYDRATE 18 MCG IN CAPS
18.0000 ug | ORAL_CAPSULE | Freq: Every day | RESPIRATORY_TRACT | Status: DC
  Administered 2011-08-31 – 2011-09-01 (×2): 18 ug via RESPIRATORY_TRACT
  Filled 2011-08-30: qty 5

## 2011-08-30 MED ORDER — SODIUM CHLORIDE 0.9 % IV SOLN: INTRAVENOUS | Status: DC

## 2011-08-30 MED ORDER — PHENOL 1.4 % MT LIQD: 1.0000 | OROMUCOSAL | Status: DC | PRN

## 2011-08-30 MED ORDER — GLYCOPYRROLATE 0.2 MG/ML IJ SOLN
INTRAMUSCULAR | Status: DC | PRN
  Administered 2011-08-30: 0.2 mg via INTRAVENOUS
  Administered 2011-08-30: .7 mg via INTRAVENOUS

## 2011-08-30 MED ORDER — HYDROMORPHONE HCL PF 1 MG/ML IJ SOLN
0.2500 mg | INTRAMUSCULAR | Status: DC | PRN
  Administered 2011-08-30 (×2): 0.25 mg via INTRAVENOUS

## 2011-08-30 MED ORDER — NITROGLYCERIN 0.4 MG SL SUBL: 0.4000 mg | SUBLINGUAL_TABLET | SUBLINGUAL | Status: DC | PRN

## 2011-08-30 MED ORDER — FAMOTIDINE 20 MG PO TABS
20.0000 mg | ORAL_TABLET | Freq: Two times a day (BID) | ORAL | Status: DC
  Administered 2011-08-30 – 2011-09-01 (×4): 20 mg via ORAL
  Filled 2011-08-30 (×5): qty 1

## 2011-08-30 MED ORDER — DOPAMINE-DEXTROSE 3.2-5 MG/ML-% IV SOLN: 3.0000 ug/kg/min | INTRAVENOUS | Status: DC

## 2011-08-30 MED ORDER — MAGNESIUM SULFATE 40 MG/ML IJ SOLN
2.0000 g | Freq: Once | INTRAMUSCULAR | Status: AC | PRN
  Filled 2011-08-30: qty 50

## 2011-08-30 MED ORDER — TRAMADOL HCL 50 MG PO TABS: 50.0000 mg | ORAL_TABLET | Freq: Four times a day (QID) | ORAL | Status: DC | PRN

## 2011-08-30 MED ORDER — ATORVASTATIN CALCIUM 40 MG PO TABS: 40.0000 mg | ORAL_TABLET | Freq: Every day | ORAL | Status: DC

## 2011-08-30 MED ORDER — DEXTRAN 40 IN SALINE 10-0.9 % IV SOLN
25.0000 mL/h | INTRAVENOUS | Status: DC
  Administered 2011-08-30: 25 mL/h
  Filled 2011-08-30: qty 500

## 2011-08-30 MED ORDER — FLUTICASONE-SALMETEROL 250-50 MCG/DOSE IN AEPB
1.0000 | INHALATION_SPRAY | Freq: Two times a day (BID) | RESPIRATORY_TRACT | Status: DC
  Administered 2011-08-30 – 2011-09-01 (×4): 1 via RESPIRATORY_TRACT
  Filled 2011-08-30: qty 14

## 2011-08-30 MED ORDER — SENNOSIDES-DOCUSATE SODIUM 8.6-50 MG PO TABS
1.0000 | ORAL_TABLET | Freq: Every evening | ORAL | Status: DC | PRN
  Filled 2011-08-30: qty 1

## 2011-08-30 MED ORDER — BISACODYL 10 MG RE SUPP: 10.0000 mg | Freq: Every day | RECTAL | Status: DC | PRN

## 2011-08-30 MED ORDER — DEXTROSE-NACL 5-0.9 % IV SOLN
INTRAVENOUS | Status: DC
  Administered 2011-08-30: 16:00:00 via INTRAVENOUS

## 2011-08-30 MED ORDER — LEVOTHYROXINE SODIUM 125 MCG PO TABS
125.0000 ug | ORAL_TABLET | Freq: Every day | ORAL | Status: DC
  Administered 2011-08-31 – 2011-09-01 (×2): 125 ug via ORAL
  Filled 2011-08-30 (×3): qty 1

## 2011-08-30 MED ORDER — NEOSTIGMINE METHYLSULFATE 1 MG/ML IJ SOLN
INTRAMUSCULAR | Status: DC | PRN
  Administered 2011-08-30: 5 mg via INTRAVENOUS

## 2011-08-30 MED ORDER — CITALOPRAM HYDROBROMIDE 20 MG PO TABS
20.0000 mg | ORAL_TABLET | Freq: Every day | ORAL | Status: DC
  Administered 2011-08-31 – 2011-09-01 (×2): 20 mg via ORAL
  Filled 2011-08-30 (×2): qty 1

## 2011-08-30 MED ORDER — PROTAMINE SULFATE 10 MG/ML IV SOLN
INTRAVENOUS | Status: DC | PRN
  Administered 2011-08-30: 30 mg via INTRAVENOUS

## 2011-08-30 MED ORDER — ROCURONIUM BROMIDE 100 MG/10ML IV SOLN
INTRAVENOUS | Status: DC | PRN
  Administered 2011-08-30: 50 mg via INTRAVENOUS
  Administered 2011-08-30: 10 mg via INTRAVENOUS

## 2011-08-30 MED ORDER — HYDROMORPHONE HCL PF 1 MG/ML IJ SOLN: 0.5000 mg | INTRAMUSCULAR | Status: DC | PRN

## 2011-08-30 MED ORDER — HYDRALAZINE HCL 20 MG/ML IJ SOLN
10.0000 mg | INTRAMUSCULAR | Status: DC | PRN
  Filled 2011-08-30: qty 0.5

## 2011-08-30 MED ORDER — HEPARIN SODIUM (PORCINE) 1000 UNIT/ML IJ SOLN
INTRAMUSCULAR | Status: DC | PRN
  Administered 2011-08-30: 5500 [IU] via INTRAVENOUS

## 2011-08-30 MED ORDER — HYDROMORPHONE HCL PF 1 MG/ML IJ SOLN
INTRAMUSCULAR | Status: AC
  Filled 2011-08-30: qty 1

## 2011-08-30 MED ORDER — LIDOCAINE HCL 4 % MT SOLN
OROMUCOSAL | Status: DC | PRN
  Administered 2011-08-30: 4 mL via TOPICAL

## 2011-08-30 MED ORDER — TAMSULOSIN HCL 0.4 MG PO CAPS
0.4000 mg | ORAL_CAPSULE | Freq: Every day | ORAL | Status: DC
  Administered 2011-08-30 – 2011-08-31 (×2): 0.4 mg via ORAL
  Filled 2011-08-30 (×3): qty 1

## 2011-08-30 MED ORDER — HEMOSTATIC AGENTS (NO CHARGE) OPTIME: TOPICAL | Status: DC | PRN

## 2011-08-30 MED ORDER — SODIUM CHLORIDE 0.9 % IV SOLN
500.0000 mL | Freq: Once | INTRAVENOUS | Status: AC | PRN
  Administered 2011-08-30: 500 mL via INTRAVENOUS

## 2011-08-30 MED ORDER — LACTATED RINGERS IV SOLN
INTRAVENOUS | Status: DC | PRN
  Administered 2011-08-30 (×2): via INTRAVENOUS

## 2011-08-30 MED ORDER — DOCUSATE SODIUM 100 MG PO CAPS
100.0000 mg | ORAL_CAPSULE | Freq: Every day | ORAL | Status: DC
  Administered 2011-08-31 – 2011-09-01 (×2): 100 mg via ORAL
  Filled 2011-08-30 (×2): qty 1

## 2011-08-30 MED ORDER — PROMETHAZINE HCL 25 MG/ML IJ SOLN: 6.2500 mg | INTRAMUSCULAR | Status: DC | PRN

## 2011-08-30 MED ORDER — DOPAMINE-DEXTROSE 3.2-5 MG/ML-% IV SOLN
3.0000 ug/kg/min | INTRAVENOUS | Status: DC
  Administered 2011-08-30: 3 ug/kg/min via INTRAVENOUS

## 2011-08-30 SURGICAL SUPPLY — 48 items
ADH SKN CLS APL DERMABOND .7 (GAUZE/BANDAGES/DRESSINGS) ×1
BAG DECANTER FOR FLEXI CONT (MISCELLANEOUS) ×3 IMPLANT
CANISTER SUCTION 2500CC (MISCELLANEOUS) ×2 IMPLANT
CATH ROBINSON RED A/P 18FR (CATHETERS) ×2 IMPLANT
CLIP TI MEDIUM 24 (CLIP) ×2 IMPLANT
CLIP TI WIDE RED SMALL 24 (CLIP) ×2 IMPLANT
CLOTH BEACON ORANGE TIMEOUT ST (SAFETY) ×2 IMPLANT
COVER PROBE W GEL 5X96 (DRAPES) ×1 IMPLANT
COVER SURGICAL LIGHT HANDLE (MISCELLANEOUS) ×4 IMPLANT
CRADLE DONUT ADULT HEAD (MISCELLANEOUS) ×2 IMPLANT
DERMABOND ADVANCED (GAUZE/BANDAGES/DRESSINGS) ×1
DERMABOND ADVANCED .7 DNX12 (GAUZE/BANDAGES/DRESSINGS) ×1 IMPLANT
DRAPE WARM FLUID 44X44 (DRAPE) ×2 IMPLANT
ELECT REM PT RETURN 9FT ADLT (ELECTROSURGICAL) ×2
ELECTRODE REM PT RTRN 9FT ADLT (ELECTROSURGICAL) ×1 IMPLANT
GLOVE BIO SURGEON STRL SZ 6.5 (GLOVE) ×3 IMPLANT
GLOVE BIO SURGEON STRL SZ7 (GLOVE) ×3 IMPLANT
GLOVE BIOGEL PI IND STRL 6.5 (GLOVE) IMPLANT
GLOVE BIOGEL PI IND STRL 7.0 (GLOVE) IMPLANT
GLOVE BIOGEL PI IND STRL 7.5 (GLOVE) ×1 IMPLANT
GLOVE BIOGEL PI INDICATOR 6.5 (GLOVE) ×3
GLOVE BIOGEL PI INDICATOR 7.0 (GLOVE) ×2
GLOVE BIOGEL PI INDICATOR 7.5 (GLOVE) ×1
GOWN STRL NON-REIN LRG LVL3 (GOWN DISPOSABLE) ×6 IMPLANT
HEMOSTAT SURGICEL 2X14 (HEMOSTASIS) IMPLANT
KIT BASIN OR (CUSTOM PROCEDURE TRAY) ×2 IMPLANT
KIT ROOM TURNOVER OR (KITS) ×2 IMPLANT
NS IRRIG 1000ML POUR BTL (IV SOLUTION) ×4 IMPLANT
PACK CAROTID (CUSTOM PROCEDURE TRAY) ×2 IMPLANT
PAD ARMBOARD 7.5X6 YLW CONV (MISCELLANEOUS) ×4 IMPLANT
PATCH VASCULAR VASCU GUARD 1X6 (Vascular Products) ×1 IMPLANT
SET COLLECT BLD 21X3/4 12 (NEEDLE) IMPLANT
SHUNT CAROTID BYPASS 10 (VASCULAR PRODUCTS) ×1 IMPLANT
SHUNT CAROTID BYPASS 12FRX15.5 (VASCULAR PRODUCTS) IMPLANT
SPECIMEN JAR SMALL (MISCELLANEOUS) ×2 IMPLANT
SPONGE SURGIFOAM ABS GEL 100 (HEMOSTASIS) IMPLANT
SUT ETHILON 3 0 PS 1 (SUTURE) IMPLANT
SUT MNCRL AB 4-0 PS2 18 (SUTURE) ×2 IMPLANT
SUT PROLENE 6 0 BV (SUTURE) ×3 IMPLANT
SUT PROLENE 7 0 BV 1 (SUTURE) ×5 IMPLANT
SUT VIC AB 3-0 SH 27 (SUTURE) ×2
SUT VIC AB 3-0 SH 27X BRD (SUTURE) ×1 IMPLANT
SYR TB 1ML LUER SLIP (SYRINGE) IMPLANT
SYSTEM CHEST DRAIN TLS 7FR (DRAIN) ×1 IMPLANT
TOWEL OR 17X24 6PK STRL BLUE (TOWEL DISPOSABLE) ×3 IMPLANT
TOWEL OR 17X26 10 PK STRL BLUE (TOWEL DISPOSABLE) ×2 IMPLANT
TRAY FOLEY CATH 14FRSI W/METER (CATHETERS) ×2 IMPLANT
WATER STERILE IRR 1000ML POUR (IV SOLUTION) ×2 IMPLANT

## 2011-08-30 NOTE — Interval H&P Note (Signed)
[facility] [place]  History and Physical Update  The patient was interviewed and re-examined.  The patient's previous History and Physical has been reviewed and is unchanged.  There is no change in the plan of care.  Leonides Sake, MD [facility] [place] Office: [phone] Pager: [phone]  08/30/2011, 8:56 AM

## 2011-08-30 NOTE — Anesthesia Preprocedure Evaluation (Signed)
Anesthesia Evaluation  Patient identified by MRN, date of birth, ID band Patient awake    Reviewed: Allergy & Precautions, H&P , NPO status , Patient's Chart, lab work & pertinent test results  Airway Mallampati: III TM Distance: >3 FB Neck ROM: Full    Dental No notable dental hx. (+) Edentulous Upper and Partial Lower   Pulmonary shortness of breath and with exertion, COPD COPD inhaler, Current Smoker,  breath sounds clear to auscultation  Pulmonary exam normal + decreased breath sounds      Cardiovascular hypertension, Pt. on medications + CAD and + Past MI Rhythm:Regular Rate:Normal + Carotid Bruit    Neuro/Psych Depression CVA, No Residual Symptoms    GI/Hepatic Neg liver ROS, GERD-  Medicated and Controlled,  Endo/Other  Hypothyroidism Hyperthyroidism Hyperlipidemia  Renal/GU Renal Carcinoma     Musculoskeletal negative musculoskeletal ROS (+)   Abdominal (+)  Abdomen: soft.    Peds  Hematology negative hematology ROS (+)   Anesthesia Other Findings   Reproductive/Obstetrics                           Anesthesia Physical Anesthesia Plan  ASA: III  Anesthesia Plan: General   Post-op Pain Management:    Induction: Intravenous  Airway Management Planned: Oral ETT  Additional Equipment: Arterial line  Intra-op Plan:   Post-operative Plan: Extubation in OR  Informed Consent: I have reviewed the patients History and Physical, chart, labs and discussed the procedure including the risks, benefits and alternatives for the proposed anesthesia with the patient or authorized representative who has indicated his/her understanding and acceptance.   Dental advisory given  Plan Discussed with: CRNA, Anesthesiologist and Surgeon  Anesthesia Plan Comments:         Anesthesia Quick Evaluation

## 2011-08-30 NOTE — Progress Notes (Signed)
Dr. Darrick Penna notified of patient's EKG results. New orders for cardiac enzymes received. Will continue to monitor at this time and will notify MD if HR in 30's occurs any further.

## 2011-08-30 NOTE — Progress Notes (Signed)
Patient remains with HR 36-50's sinus brady, blood pressure stable on dopamine gtt at 95mcg/kg/min. Patient asymptomatic. No prior documentation noted of notifying MD of low HR since patient in PACU. Dr. Darrick Penna notified and updated. New orders received for EKG and to notify once complete.

## 2011-08-30 NOTE — Telephone Encounter (Signed)
Message copied by Sara Chu on Wed Aug 30, 2011  3:36 PM ------      Message from: Melene Plan      Created: Wed Aug 30, 2011  3:11 PM                   ----- Message -----         From: Marlowe Shores, PA         Sent: 08/30/2011   2:52 PM           To: Melene Plan, RN            2 weeks F/U Dr Imogene Burn - Left CEA

## 2011-08-30 NOTE — Telephone Encounter (Signed)
Left message for patient with f/u appt date/time per [facility] staff message

## 2011-08-30 NOTE — H&P (View-Only) (Signed)
[facility] [place]  Established Carotid Patient  History of Present Illness  Michael [name] is a 78 y.o. (December 03, 1931) male who presents with chief complaint: follow up on cardiac optimization.  Pt's CTA neck demonstrated: RICA 70-75%, L ICA 75-80%.  Patient has no history of TIA or stroke symptom.   The pt was cleared for L CEA by Dr. Peyton Bottoms.  Past Medical History  Diagnosis Date  . Hyperthyroidism 1978    RAI  . Shingles 2001  . Heart attack   . Vitreous hemorrhage   . PVD (peripheral vascular disease)   . Other symptoms involving cardiovascular system   . Other and unspecified hyperlipidemia   . Unspecified essential hypertension   . Allergic rhinitis, cause unspecified   . Chronic airway obstruction, not elsewhere classified   . History of asbestos exposure   . Left renal mass   . Tobacco abuse   . Prostate cancer     xrt/IMRT 2008  . Renal cancer     cryoablation in 2012  . Coronary atherosclerosis of native coronary artery     stent x 3-one drug eluting  . Depression   . Anemia   . Peripheral arterial disease   . CAD (coronary artery disease)   . Carotid artery occlusion   . Stroke     Past Surgical History  Procedure Date  . Popliteal synovial cyst excision     knee  . Cholecystectomy   . Cervical disc surgery     fusion  . Partial knee arthroplasty     Right-replacement  . Nasal septum surgery     SMR  . Cardiac catheterization   . Coronary angioplasty     RCA   . Neck disk fusion 07/2002    History   Social History  . Marital Status: Widowed    Spouse Name: N/A    Number of Children: N/A  . Years of Education: N/A   Occupational History  . Retired      [employer]   Social History Main Topics  . Smoking status: Current Everyday Smoker -- 0.2 packs/day for 60 years    Types: Cigarettes  . Smokeless tobacco: Never Used  . Alcohol Use: No  . Drug Use: No  . Sexually Active: Not on file   Other  Topics Concern  . Not on file   Social History Narrative  . No narrative on file    Family History  Problem Relation Age of Onset  . Allergies    . Heart disease    . Cancer    . Coronary artery disease    . Coronary artery disease Brother   . Cancer Brother   . Heart disease Brother     Current Outpatient Prescriptions on File Prior to Visit  Medication Sig Dispense Refill  . albuterol (PROAIR HFA) 108 (90 BASE) MCG/ACT inhaler Inhale 2 puffs into the lungs every 6 (six) hours as needed.       Marland Kitchen amLODipine (NORVASC) 10 MG tablet Take 10 mg by mouth daily.        Marland Kitchen aspirin 81 MG tablet Take 81 mg by mouth daily.        Marland Kitchen atorvastatin (LIPITOR) 40 MG tablet Take 40 mg by mouth daily.        . cetirizine (ZYRTEC) 10 MG tablet Take 10 mg by mouth daily.        . citalopram (CELEXA) 20 MG tablet Take 20 mg by mouth  daily.        . ferrous sulfate 325 (65 FE) MG tablet Take 325 mg by mouth at bedtime.       . Fluticasone-Salmeterol (ADVAIR DISKUS) 250-50 MCG/DOSE AEPB Inhale 1 puff into the lungs every 12 (twelve) hours.        . hydrochlorothiazide 25 MG tablet Take 25 mg by mouth daily.        . isosorbide mononitrate (IMDUR) 60 MG 24 hr tablet Take 90 mg by mouth daily.      Marland Kitchen levothyroxine (SYNTHROID, LEVOTHROID) 125 MCG tablet Take 125 mcg by mouth daily.        . mometasone (NASONEX) 50 MCG/ACT nasal spray Place 2 sprays into the nose daily.  17 g  2  . Multiple Vitamin (MULTIVITAMIN) tablet Take 1 tablet by mouth daily.        . nitroGLYCERIN (NITROSTAT) 0.4 MG SL tablet Place 1 tablet (0.4 mg total) under the tongue every 5 (five) minutes as needed.  25 tablet  2  . Omega-3 Fatty Acids (FISH OIL) 1200 MG CAPS Take 2 capsules by mouth 2 (two) times daily.        . ranitidine (ZANTAC) 150 MG tablet Take 150 mg by mouth 2 (two) times daily.       . Tamsulosin HCl (FLOMAX) 0.4 MG CAPS Take 0.4 mg by mouth daily.        . theophylline (THEODUR) 200 MG 12 hr tablet Take 100 mg by  mouth 2 (two) times daily.      Marland Kitchen tiotropium (SPIRIVA) 18 MCG inhalation capsule Place 1 capsule (18 mcg total) into inhaler and inhale daily.  30 capsule  5  . traZODone (DESYREL) 100 MG tablet Take 100 mg by mouth at bedtime as needed.       . theophylline (THEOPHYLLINE) 100 MG 12 hr tablet Take 1 tablet (100 mg total) by mouth 2 (two) times daily.  60 tablet  2    Allergies  Allergen Reactions  . Ciprofloxacin     REACTION: nausea  . Hydrocodone-Acetaminophen   . Iron Itching  . Lyrica (Pregabalin)   . Morphine And Related   . Oxycodone     hives  . Penicillins     REACTION: hives    Review of Systems (Positive items checked otherwise negative)  General: [ ]  Weight loss, [ ]  Weight gain, [ ]   Loss of appetite, [ ]  Fever  Neurologic: [ ]  Dizziness, [ ]  Blackouts, [ ]  Headaches, [ ]  Seizure  Ear/Nose/Throat: [ ]  Change in eyesight, [ ]  Change in hearing, [ ]  Nose bleeds, [ ]  Sore throat  Vascular: [ ]  Pain in legs with walking, [ ]  Pain in feet while lying flat, [ ]  Non-healing ulcer, Stroke, [ ]  "Mini stroke", [ ]  Slurred speech, [ ]  Temporary blindness, [ ]  Blood clot in vein, [ ]  Phlebitis  Pulmonary: [ ]  Home oxygen, [ ]  Productive cough, [ ]  Bronchitis, [ ]  Coughing up blood, [ ]  Asthma, [ ]  Wheezing, [x]  COPD  Musculoskeletal: [ ]  Arthritis, [x]  Joint pain, [ ]  Muscle pain  Cardiac: [ ]  Chest pain, [ ]  Chest tightness/pressure, [ ]  Shortness of breath when lying flat, [x]  Shortness of breath with exertion, [ ]  Palpitations, [ ]  Heart murmur, [ ]  Arrythmia,  [ ]  Atrial fibrillation  Hematologic: [ ]  Bleeding problems, [ ]  Clotting disorder, [ ]  Anemia  Psychiatric:  [ ]  Depression, [ ]  Anxiety, [ ]  Attention deficit disorder  Gastrointestinal:  [ ]  Black stool,[ ]   Blood in stool, [ ]  Peptic ulcer disease, [ ]  Reflux, [ ]  Hiatal hernia, [ ]  Trouble swallowing, [ ]  Diarrhea, [ ]  Constipation  Urinary:  [ ]  Kidney disease, [ ]  Burning with urination, [ ]  Frequent  urination, [ ]  Difficulty urinating  Skin: [ ]  Ulcers, [ ]  Rashes  Physical Examination  Filed Vitals:   08/25/11 1606  BP: 125/68  Pulse: 81  Temp: 98 F (36.7 C)  TempSrc: Oral  Height: 5\' 8"  (1.727 m)  Weight: 158 lb (71.668 kg)   Body mass index is 24.02 kg/(m^2).  General: A&O x 3, WDWN  Neck: tranverse incision right neck  Eyes: PERRLA, EOMI  Pulmonary: Sym exp, good air movt, CTAB, no rales, rhonchi, & wheezing  Cardiac: RRR, Nl S1, S2, no Murmurs, rubs or gallops  Vascular: Vessel Right Left  Radial Palpable Palpable  Brachial Palpable Palpable  Carotid Palpable, without bruit Palpable, without bruit  Aorta Non-palpable N/A  Femoral Palpable Palpable  Popliteal Non-palpable Non-palpable  PT Palpable Palpable  DP Palpable Palpable   Gastrointestinal: soft, NTND, -G/R, - HSM, - masses, - CVAT B  Musculoskeletal: M/S 5/5 throughout , Extremities without ischemic changes   Neurologic: CN 2-12 intact , Pain and light touch intact in extremities , Motor exam as listed above  CTA Neck (08/04/11)  IMPRESSION:  1. Bulky calcified plaque at both carotid bifurcations resulting  in high-grade stenosis by CTA:  - 70-75% right ICA origin stenosis.  - 75-80% left ICA origin stenosis.  2. No atherosclerosis of the vertebral arteries. Mild atherosclerosis of the great vessels but no great vessel origin stenosis.  Based on my interpretation of the CTA, he has significant calcific atherosclerotic plaque at both carotid bifurcation.  I have concern that some segments of the LICA are >80% stenoses.  The RICA appears to be <80% currently.    Medical Decision Making  LEVERETT CAMPLIN is a 78 y.o. male who presents with: Asx B ICA stenosis: L > 80%, R < 80%   Based on the patient's vascular studies and examination, I have offered the patient: L CEA.  Given his significant calcification I think carotid stent is relatively contraindicated.  He has been cleared for carotid  surgery and optimized by his cardiologist and pulmonologist. I discussed with the patient the risks, benefits, and alternatives to carotid endarterectomy.   I discussed the procedural details of carotid endarterectomy with the patient.  The patient is aware that the risks of carotid endarterectomy include but are not limited to: bleeding, infection, stroke, myocardial infarction, death, cranial nerve injuries both temporary and permanent, neck hematoma, possible airway compromise, labile blood pressure post-operatively, cerebral hyperperfusion syndrome, and possible need for additional interventions in the future.  The patient is aware of the risks and agrees to proceed forward with the procedure.  This is being scheduled for the 1st May 2013.  I discussed in depth with the patient the nature of atherosclerosis, and emphasized the importance of maximal medical management including strict control of blood pressure, blood glucose, and lipid levels, antiplatelet agents, obtaining regular exercise, and cessation of smoking.  The patient is aware that without maximal medical management the underlying atherosclerotic disease process will progress, limiting the benefit of any interventions.  Thank you for allowing [place] to participate in this patient's care.  Leonides Sake, MD [facility] [place] Office: [phone] Pager: [phone]  08/25/2011, 4:53 PM

## 2011-08-30 NOTE — Transfer of Care (Signed)
Immediate Anesthesia Transfer of Care Note  Patient: Michael [name]  Procedure(s) Performed: Procedure(s) (LRB): ENDARTERECTOMY CAROTID (Left)  Patient Location: PACU  Anesthesia Type: General  Level of Consciousness: awake and alert   Airway & Oxygen Therapy: Patient Spontanous Breathing  Post-op Assessment: Report given to PACU RN, Post -op Vital signs reviewed and stable, Patient moving all extremities and Patient moving all extremities X 4  Post vital signs: Reviewed and stable  Complications: No apparent anesthesia complications

## 2011-08-30 NOTE — Discharge Summary (Signed)
[facility] Discharge Summary   Patient ID:  Michael [name] MRN: [id number] DOB/AGE: [birth year] 78 y.o.  Admit date: 08/30/2011 Discharge date: 08/30/2011 Date of Surgery: 08/30/2011 Surgeon: Surgeon(s): [name], MD  Admission Diagnosis: Left ICA stenosis  Discharge Diagnoses:  Left ICA stenosis  Secondary Diagnoses: Past Medical History  Diagnosis Date  . Hyperthyroidism 1978    RAI  . Shingles 2001  . Vitreous hemorrhage   . PVD (peripheral vascular disease)   . Other symptoms involving cardiovascular system   . Other and unspecified hyperlipidemia   . Unspecified essential hypertension   . Allergic rhinitis, cause unspecified   . Chronic airway obstruction, not elsewhere classified   . History of asbestos exposure   . Left renal mass   . Tobacco abuse   . Prostate cancer     xrt/IMRT 2008  . Renal cancer     cryoablation in 2012  . Coronary atherosclerosis of native coronary artery     stent x 3-one drug eluting  . Depression   . Anemia   . Peripheral arterial disease   . CAD (coronary artery disease)   . Carotid artery occlusion   . Stroke   . Hypothyroidism   . Heart attack unsure, "quite a few"  . Shortness of breath     Procedure(s): LEFT ENDARTERECTOMY CAROTID  Discharged Condition: good  HPI:  Michael [name] is a 78 y.o. (1932-01-22) male who presents with chief complaint: follow up on cardiac optimization. Pt's CTA neck demonstrated: RICA 70-75%, L ICA 75-80%. Patient has no history of TIA or stroke symptom.  The pt was cleared for L CEA by Dr. Peyton Bottoms. Pt. Admitted for left carotid endarterectomy  Hospital Course:  Michael [name] is a 78 y.o. male is S/P Left ENDARTERECTOMY CAROTID Extubated: POD # 0 Post-op wounds healing well Pt. Ambulating, voiding and taking PO diet without difficulty. Pt pain controlled with PO pain meds. Labs as below Complications:Bradycardia and hypotension likely related to carotid body  manipulation which happens normally during a carotid endarterectomy when the disease extends into internal carotid artery. Pt required Dopamine overnight to 1st POD but was successfully weaned that day. He will call PMD - Dr. Margot Ables, with BP at home for med adjustments   Consults:     Significant Diagnostic Studies: CBC Lab Results  Component Value Date   WBC 9.5 08/29/2011   HGB 13.2 08/29/2011   HCT 37.3* 08/29/2011   MCV 92.6 08/29/2011   PLT 221 08/29/2011    BMET    Component Value Date/Time   NA 131* 08/29/2011 1415   K 3.5 08/29/2011 1415   CL 93* 08/29/2011 1415   CO2 29 08/29/2011 1415   GLUCOSE 107* 08/29/2011 1415   BUN 11 08/29/2011 1415   CREATININE 1.09 08/29/2011 1415   CALCIUM 9.3 08/29/2011 1415   GFRNONAA 63* 08/29/2011 1415   GFRAA 74* 08/29/2011 1415   COAG Lab Results  Component Value Date   INR 0.91 08/29/2011   INR 0.99 12/02/2010   INR 1.04 11/21/2010     Disposition:  Discharge to :Home Discharge Orders    Future Appointments: Provider: Department: Dept Phone: Center:   03/01/2012 3:00 PM Waymon Budge, MD [facility] [phone] None     Future Orders Please Complete By Expires   Resume previous diet      Driving Restrictions      Comments:   No driving for 2 weeks   Lifting  restrictions      Comments:   No lifting for 4 weeks   Call MD for:  temperature >100.5      Call MD for:  redness, tenderness, or signs of infection (pain, swelling, bleeding, redness, odor or green/yellow discharge around incision site)      Call MD for:  severe or increased pain, loss or decreased feeling  in affected limb(s)      Increase activity slowly      Comments:   Walk with assistance use walker or cane as needed   May shower       Scheduling Instructions:   Friday   may wash over wound with mild soap and water      CAROTID [place]: Call MD for difficulty swallowing or speaking; weakness in arms or legs that is a new symtom; severe headache.  If you  have increased swelling in the neck and/or  are having difficulty breathing, CALL 911         Clenney, Sina L  Home Medication Instructions RUE:[id number]   Printed on:08/30/11 1454  Medication Information                    Fluticasone-Salmeterol (ADVAIR DISKUS) 250-50 MCG/DOSE AEPB Inhale 1 puff into the lungs every 12 (twelve) hours.             amLODipine (NORVASC) 10 MG tablet Take 10 mg by mouth daily.            aspirin 81 MG tablet Take 81 mg by mouth daily.             citalopram (CELEXA) 20 MG tablet Take 20 mg by mouth daily.            ferrous sulfate 325 (65 FE) MG tablet Take 325 mg by mouth at bedtime.            Omega-3 Fatty Acids (FISH OIL) 1200 MG CAPS Take 2 capsules by mouth 2 (two) times daily.             Tamsulosin HCl (FLOMAX) 0.4 MG CAPS Take 0.4 mg by mouth daily.             hydrochlorothiazide 25 MG tablet Take 25 mg by mouth daily.             atorvastatin (LIPITOR) 40 MG tablet Take 40 mg by mouth daily.             Multiple Vitamin (MULTIVITAMIN) tablet Take 1 tablet by mouth daily.             albuterol (PROAIR HFA) 108 (90 BASE) MCG/ACT inhaler Inhale 2 puffs into the lungs every 6 (six) hours as needed. For shortness of breath           levothyroxine (SYNTHROID, LEVOTHROID) 125 MCG tablet Take 125 mcg by mouth daily.            traZODone (DESYREL) 100 MG tablet Take 100 mg by mouth at bedtime as needed. For sleep           ranitidine (ZANTAC) 150 MG tablet Take 150 mg by mouth 2 (two) times daily.            cetirizine (ZYRTEC) 10 MG tablet Take 10 mg by mouth daily.            tiotropium (SPIRIVA) 18 MCG inhalation capsule Place 1 capsule (18 mcg total) into inhaler and inhale daily.  mometasone (NASONEX) 50 MCG/ACT nasal spray Place 2 sprays into the nose daily.           isosorbide mononitrate (IMDUR) 60 MG 24 hr tablet Take 90 mg by mouth daily.           theophylline (THEODUR) 200 MG 12 hr tablet Take 100  mg by mouth 2 (two) times daily.           naproxen sodium (ANAPROX) 220 MG tablet Take 440 mg by mouth daily as needed. For pain           nitroGLYCERIN (NITROSTAT) 0.4 MG SL tablet Place 0.4 mg under the tongue every 5 (five) minutes as needed. For chest pain           traMADol (ULTRAM) 50 MG tablet Take 1 tablet (50 mg total) by mouth every 6 (six) hours as needed for pain.            Verbal and written Discharge instructions given to the patient. Wound care per Discharge AVS Follow-up Information    Follow up with Nilda Simmer, MD in 2 weeks. (office will arrange - sent)    Contact information:   [address] [place] [address] [phone]          Signed: Marlowe Shores 08/30/2011, 2:54 PM  Addendum  I have independently interviewed and examined the patient, and I agree with the physician assistant's discharge summary.  This patient underwent a left CEA for high grade LICA stenosis that turned out to be an ulcerated lesion.  His post-operative course was notable for transient hypotension and bradycardia likely related to carotid body manipulation.  This resolved by the afternoon of POD #1.  He was kept in the hospital for an additional day to ensure his blood pressure and heart normalized.  He will follow up with his PCP for further titration of his BP regimen as needed.  He will follow up in the office in 2 weeks.  Leonides Sake, MD [facility] [place] Office: [phone] Pager: [phone]  09/01/2011, 8:33 AM

## 2011-08-30 NOTE — Anesthesia Postprocedure Evaluation (Signed)
Anesthesia Post Note  Patient: Michael [name]  Procedure(s) Performed: Procedure(s) (LRB): ENDARTERECTOMY CAROTID (Left)  Anesthesia type: general  Patient location: PACU  Post pain: Pain level controlled  Post assessment: Patient's Cardiovascular Status Stable  Last Vitals:  Filed Vitals:   08/30/11 1800  BP: 104/45  Pulse: 48  Temp:   Resp: 15    Post vital signs: Reviewed and stable  Level of consciousness: sedated  Complications: No apparent anesthesia complications

## 2011-08-30 NOTE — Op Note (Signed)
OPERATIVE NOTE  PROCEDURE:   1.  left carotid endarterectomy with primary repair 2.  left intraoperative carotid ultrasound  PRE-OPERATIVE DIAGNOSIS: left high grade asymptomatic carotid stenosis  POST-OPERATIVE DIAGNOSIS: same as above   SURGEON: Leonides Sake, MD  ASSISTANT(S): Della Goo, [facility]  ANESTHESIA: general  ESTIMATED BLOOD LOSS: 100 cc  FINDING(S): 1.  Continuous Doppler audible flow signatures are appropriate for each carotid artery. 2.  No evidence of intimal flap visualized on transverse or longitudinal ultrasonography. 3.  Calcified ulcerated carotid plaque.  SPECIMEN(S):  Carotid plaque (sent to Pathology)  INDICATIONS:   Michael [name] is a 78 y.o. male who  presents with left high grade asymptomatic carotid stenosis >80% on CTA Neck.  I discussed with the patient the risks, benefits, and alternatives to carotid endarterectomy.  The patient is not a candidate for carotid artery stenting. I discussed the procedural details of carotid endarterectomy with the patient.  The patient is aware that the risks of carotid endarterectomy include but are not limited to: bleeding, infection, stroke, myocardial infarction, death, cranial nerve injuries both temporary and permanent, neck hematoma, possible airway compromise, labile blood pressure post-operatively, cerebral hyperperfusion syndrome, and possible need for additional interventions in the future. The patient is aware of the risks and agrees to proceed forward with the procedure.  DESCRIPTION: After full informed written consent was obtained from the patient, the patient was brought back to the operating room and placed supine upon the operating table.  Prior to induction, the patient received IV antibiotics.  After obtaining adequate anesthesia, the patient was placed into semi-Fowler position with a shoulder roll in place and the patient's neck slightly hyperextended and rotated away from the surgical site.  The patient  was prepped in the standard fashion for a left carotid endarterectomy.  I made an incision anterior to the sternocleidomastoid muscle and dissected down through the subcutaneous tissue.  The platysmas was opened with electrocautery.  Then I dissected down to the internal jugular vein.  This was dissected posteriorly until I obtained visualization of the common carotid artery.  This was dissected out and then an umbilical tape was placed around the common carotid artery and I loosely applied a Rumel tourniquet.  I then dissected in a periadventitial fashion along the common carotid artery up to the bifurcation.  I then identified the external carotid artery and the superior thyroid artery.  A 2-0 silk tie was looped around the superior thyroid artery, and I also dissected out the external carotid artery and placed a vessel loop around it.  In continuing the dissection to the internal carotid artery, I identified the facial vein.  This was ligated and then transected, giving me improved exposure of the internal carotid artery.  In the process of this dissection, the hypoglossal nerve was identified.  I then dissected out the internal carotid artery until I identified an area of soft tissue in the internal carotid artery.  I dissected slightly distal to this area, and placed an umbilical tape around the artery and loosely applied a Rumel tourniquet.  At this point, we gave the patient a therapeutic bolus of Heparin intravenously (roughly 80 units/kg).  After waiting 3 minutes, then I clamped the internal carotid artery, external carotid artery and then the common carotid artery.  I then made an arteriotomy in the common carotid artery with a 11 blade, and extended the arteriotomy with a Potts scissor down into the common carotid artery, then I carried the arteriotomy through the bifurcation  into the internal carotid artery until I reached an area that was not diseased.  At this point, I took the 10 shunt that  previously been prepared and I inserted it into the internal carotid artery.  The Rumel tourniquet was then applied to this end of the shunt.  I unclamped the shunt to verify retrograde blood flow in the internal carotid artery.  I then placed the other end of the shunt into the common carotid artery after unclamping the artery.  The Rumel was tightened down around the shunt.  At this point, I verified blood flow in the shunt with a continuous doppler.  At this point, I started the endarterectomy in the common carotid artery with a Cytogeneticist and carried this dissection down into the common carotid artery circumferentially.  Then I transected the plaque at a segment where it was adherent.  I then carried this dissection up into the external carotid artery.  The plaque was extracted by unclamping the external carotid artery and everting the artery.  The dissection was then carried into the internal carotid artery, extracting the remaining portion of the carotid plaque.  I passed the plaque off the field as a specimen.  I then spent the next 30 minutes removing intimal flaps and loose debris.  Eventually I reached the point where the residual plaque was densely adherent and any further dissection would compromise the integrity of the wall.  After verifying that there was no more loose intimal flaps or debris, I re-interrogated the entirety of this carotid artery.  At this point, I was satisfied that the minimal remaining disease was densely adherent to the wall and wall integrity was intact.  The common carotid artery and internal carotid arteries were good sized vessels, so I felt patching these arteries would make the vessels effectively aneurysmal.  I repaired the artery with two running stitches of 7-0 Prolene and tying in the middle.  Prior to completing this repair, I removed the shunt first from the internal carotid artery, from which there was excellent backbleeding, and clamped it.  Then I removed the  shunt from the common carotid artery, from which there was excellent antegrade bleeding, and then clamped it.  At this point, I allowed the external carotid artery to backbleed, which was excellent.  Then I instilled heparinized saline in this repaired artery and then tie the sutures together usual fashion.  First, I released the clamp on the external carotid artery, then I released it on the common carotid artery.  After waiting a few seconds, I then released it on the internal carotid artery.  I then interrogated this patient's arteries with the continuous Doppler.  The audible waveforms in each artery were consistent with the expected characteristics for each artery.  The Sonosite probe was then sterilely draped and used to interrogate the carotid artery in both longitudinal and transverse views.  At this point, I washed out the wound, and placed thrombin and Gelfoam throughout.  I also gave the patient 30 mg of protamine to reverse his anticoagulation.   After waiting a few minutes, I removed the thrombin and Gelfoam and washed out the wound.  There was no more active bleeding in the surgical site.   I then reapproximated the platysma muscle with a running stitch of 3-0 Vicryl.  The skin was then reapproximated with a running subcuticular 4-0 Monocryl stitch.  The skin was then cleaned, dried and Dermabond was used to reinforce the skin closure.  The patient woke  without any problems, neurologically intact.     COMPLICATIONS: none  CONDITION: stable  Leonides Sake, MD [facility] [place] Office: [phone] Pager: [phone]  08/30/2011, 2:42 PM

## 2011-08-30 NOTE — Plan of Care (Signed)
Problem: Consults Goal: Diagnosis CEA/CES/AAA Stent Carotid Endarterectomy (CEA)     

## 2011-08-31 ENCOUNTER — Encounter (HOSPITAL_COMMUNITY): Payer: Self-pay | Admitting: Vascular Surgery

## 2011-08-31 LAB — CBC
HCT: 31 % — ABNORMAL LOW (ref 39.0–52.0)
Hemoglobin: 11.1 g/dL — ABNORMAL LOW (ref 13.0–17.0)
MCH: 32.6 pg (ref 26.0–34.0)
MCHC: 35.8 g/dL (ref 30.0–36.0)
RBC: 3.4 MIL/uL — ABNORMAL LOW (ref 4.22–5.81)

## 2011-08-31 LAB — BASIC METABOLIC PANEL
BUN: 9 mg/dL (ref 6–23)
CO2: 22 mEq/L (ref 19–32)
Calcium: 8.4 mg/dL (ref 8.4–10.5)
GFR calc non Af Amer: 66 mL/min — ABNORMAL LOW (ref >90)
Glucose, Bld: 116 mg/dL — ABNORMAL HIGH (ref 70–99)

## 2011-08-31 LAB — CARDIAC PANEL(CRET KIN+CKTOT+MB+TROPI)
CK, MB: 2.1 ng/mL (ref 0.3–4.0)
CK, MB: 2.7 ng/mL (ref 0.3–4.0)
Total CK: 140 U/L (ref 7–232)
Troponin I: <0.3 ng/mL (ref <0.30)

## 2011-08-31 MED ORDER — ISOSORBIDE MONONITRATE ER 60 MG PO TB24
90.0000 mg | ORAL_TABLET | Freq: Every day | ORAL | Status: DC
  Administered 2011-09-01: 90 mg via ORAL
  Filled 2011-08-31: qty 1

## 2011-08-31 NOTE — Consult Note (Signed)
Smokes 8 cigarettes per day down from 1 1/2 ppd. Congratulated pt on cutting down. Wants to use the patch to quit completely. Recommended 14 mg patch to start out with. Discussed patch use instructions as well as how to taper down. Pt voices understanding. He is in action stage. Referred to 1-800 quit now for f/u and support. Discussed oral fixation substitutes, second hand smoke and in home smoking policy. Reviewed and gave pt Written education/contact information.

## 2011-08-31 NOTE — Progress Notes (Signed)
UR COMPLETED  

## 2011-08-31 NOTE — Progress Notes (Addendum)
VASCULAR AND VEIN SURGERY POST - OP CEA PROGRESS NOTE  Date of Surgery: 08/30/2011 Surgeon: Moishe Spice): [name], MD 1 Day Post-Op left Carotid Endarterectomy .  HPI: Michael [name] is a 78 y.o. male who is 1 Day Post-Op left Carotid Endarterectomy . Patient is doing well. Patient denies headache; Patient denies difficulty swallowing; denies weakness in upper or lower extremities; Pt. denies other symptoms of stroke or TIA. On Dopamine for hypotension  IMAGING: No results found.  Significant Diagnostic Studies: CBC Lab Results  Component Value Date   WBC 8.7 08/31/2011   HGB 11.1* 08/31/2011   HCT 31.0* 08/31/2011   MCV 91.2 08/31/2011   PLT 189 08/31/2011    BMET    Component Value Date/Time   NA 131* 08/29/2011 1415   K 3.5 08/29/2011 1415   CL 93* 08/29/2011 1415   CO2 29 08/29/2011 1415   GLUCOSE 107* 08/29/2011 1415   BUN 11 08/29/2011 1415   CREATININE 1.09 08/29/2011 1415   CALCIUM 9.3 08/29/2011 1415   GFRNONAA 63* 08/29/2011 1415   GFRAA 74* 08/29/2011 1415    COAG Lab Results  Component Value Date   INR 0.91 08/29/2011   INR 0.99 12/02/2010   INR 1.04 11/21/2010   No results found for this basename: PTT      Intake/Output Summary (Last 24 hours) at 08/31/11 0718 Last data filed at 08/31/11 0500  Gross per 24 hour  Intake 4742.1 ml  Output   1375 ml  Net 3367.1 ml    Physical Exam:  BP Readings from Last 3 Encounters:  08/31/11 117/54  08/31/11 117/54  08/29/11 144/71   Temp Readings from Last 3 Encounters:  08/31/11 97.6 F (36.4 C) Oral  08/31/11 97.6 F (36.4 C) Oral  08/29/11 97.3 F (36.3 C) Oral   SpO2 Readings from Last 3 Encounters:  08/31/11 94%  08/31/11 94%  08/29/11 96%   Pulse Readings from Last 3 Encounters:  08/31/11 82  08/31/11 82  08/29/11 82    Pt is A&O x 3 Gait is normal Speech is fluent left Neck Wound is healing well Patient with Negative tongue deviation and Negative facial droop Pt has good and equal strength in  all extremities  Assessment:Plan:  Michael [name] is a 78 y.o. male is S/P Left Carotid endarterectomy Hypotension prob. sec to manipulation of carotid sinus during surg - wean dopamine/ ambulate/ hold BP meds this am Discharge to: Home once off dopamine, ambulates and takes PO well Follow-up in 2 weeks   [place] [phone] 08/31/2011 7:18 AM  Addendum  I have independently interviewed and examined the patient, and I agree with the physician assistant's findings.  Neuro intact and incision intact with no neck hematoma.  Bradycardia and hypotension likely related to carotid body manipulation which happens normally during a carotid endarterectomy when the disease extends into internal carotid artery.  Wean dopamine as tolerated.  Anti-HTN rx on hold for now.  Leonides Sake, MD [facility] [place] Office: [phone] Pager: [phone]  08/31/2011, 8:04 AM

## 2011-09-01 MED ORDER — ISOSORBIDE MONONITRATE ER 60 MG PO TB24: 90.0000 mg | ORAL_TABLET | Freq: Every day | ORAL | Status: DC

## 2011-09-01 MED ORDER — AMLODIPINE BESYLATE 10 MG PO TABS: 10.0000 mg | ORAL_TABLET | Freq: Every day | ORAL | Status: DC

## 2011-09-01 NOTE — Discharge Instructions (Signed)
Hold Amlodipine, isosorbide and hydrochlorithiazide if Systolic blood pressure less than 110  Call today - Dr. Margot Ables regarding resuming blood pressure medications.  Take Blood Pressure in AM and in the PM and record results

## 2011-09-01 NOTE — Progress Notes (Addendum)
VASCULAR AND VEIN SURGERY POST - OP CEA PROGRESS NOTE  Date of Surgery: 08/30/2011 Surgeon: Moishe Spice): Michael Hertz, MD 2 Days Post-Op left Carotid Endarterectomy .  HPI: Michael [name] is a 78 y.o. male who is 2 Days Post-Op left Carotid Endarterectomy . Patient denies headache; Patient denies difficulty swallowing; denies weakness in upper or lower extremities; Pt. denies other symptoms of stroke or TIA. No further bradycardia or hypotension over night   Intake/Output Summary (Last 24 hours) at 09/01/11 0742 Last data filed at 08/31/11 1700  Gross per 24 hour  Intake  875.5 ml  Output    800 ml  Net   75.5 ml    Physical Exam:  BP Readings from Last 3 Encounters:  09/01/11 123/61  09/01/11 123/61  08/29/11 144/71   Temp Readings from Last 3 Encounters:  09/01/11 98 F (36.7 C) Oral  09/01/11 98 F (36.7 C) Oral  08/29/11 97.3 F (36.3 C) Oral   SpO2 Readings from Last 3 Encounters:  09/01/11 91%  09/01/11 91%  08/29/11 96%   Pulse Readings from Last 3 Encounters:  09/01/11 64  09/01/11 64  08/29/11 82    Pt is A&O x 3 Gait is normal Speech is fluent left Neck Wound is healing well Patient with Negative tongue deviation and Negative facial droop Pt has good and equal strength in all extremities  Assessment: Michael [name] is a 78 y.o. male is S/P Left Carotid endarterectomy Pt is voiding, ambulating and taking po well  post -op bradycardia and Hypotension resolved Plan: Discharge to: Home Follow-up in 2 weeks   Michael [name] [phone] 09/01/2011 7:42 AM  Addendum  I have independently interviewed and examined the patient, and I agree with the physician assistant's findings.  Pt ambulating around the floor.  Neuro intact.  Inc intact without hematoma.  D/C to home now that hypotension and bradycardia resolved.  Follow up in office in 2 weeks.  Michael Sake, MD [facility] [place] Office: [phone] Pager:  [phone]  09/01/2011, 7:56 AM

## 2011-09-01 NOTE — Progress Notes (Signed)
Discharge instructions and prescription given to pt. Verbalized understanding. Iv removed, not bleeding noted. Pt's ride here for discharge.

## 2011-09-04 DIAGNOSIS — I10 Essential (primary) hypertension: Secondary | ICD-10-CM | POA: Diagnosis not present

## 2011-09-04 DIAGNOSIS — E782 Mixed hyperlipidemia: Secondary | ICD-10-CM | POA: Diagnosis not present

## 2011-09-04 DIAGNOSIS — J438 Other emphysema: Secondary | ICD-10-CM | POA: Diagnosis not present

## 2011-09-04 DIAGNOSIS — F17200 Nicotine dependence, unspecified, uncomplicated: Secondary | ICD-10-CM | POA: Diagnosis not present

## 2011-09-04 DIAGNOSIS — E039 Hypothyroidism, unspecified: Secondary | ICD-10-CM | POA: Diagnosis not present

## 2011-09-04 DIAGNOSIS — I708 Atherosclerosis of other arteries: Secondary | ICD-10-CM | POA: Diagnosis not present

## 2011-09-06 DIAGNOSIS — N529 Male erectile dysfunction, unspecified: Secondary | ICD-10-CM | POA: Diagnosis not present

## 2011-09-06 DIAGNOSIS — D4959 Neoplasm of unspecified behavior of other genitourinary organ: Secondary | ICD-10-CM | POA: Diagnosis not present

## 2011-09-06 DIAGNOSIS — C61 Malignant neoplasm of prostate: Secondary | ICD-10-CM | POA: Diagnosis not present

## 2011-09-14 ENCOUNTER — Encounter: Payer: Self-pay | Admitting: Vascular Surgery

## 2011-09-15 ENCOUNTER — Ambulatory Visit (INDEPENDENT_AMBULATORY_CARE_PROVIDER_SITE_OTHER): Payer: Medicare Other | Admitting: Vascular Surgery

## 2011-09-15 ENCOUNTER — Encounter: Payer: Self-pay | Admitting: Vascular Surgery

## 2011-09-15 VITALS — BP 126/72 | HR 77 | Temp 97.5°F | Ht 68.0 in | Wt 159.0 lb

## 2011-09-15 DIAGNOSIS — I6523 Occlusion and stenosis of bilateral carotid arteries: Secondary | ICD-10-CM

## 2011-09-15 DIAGNOSIS — I658 Occlusion and stenosis of other precerebral arteries: Secondary | ICD-10-CM

## 2011-09-15 NOTE — Progress Notes (Signed)
[facility] [place]  Postoperative Visit  History of Present Illness  Michael [name] is a 78 y.o. male who presents for postoperative follow-up for: L CEA (Date: 08/30/11).  The patient's neck incision is healed.  The patient has had no stroke or TIA symptoms.  Physical Examination  Filed Vitals:   09/15/11 1248  BP: 126/72  Pulse: 77  Temp:     L Neck: Incision is healed Neuro: CN 2-12 are intact , Motor strength is 5/5 bilaterally, sensation is grossly intact  Medical Decision Making  Michael [name] is a 78 y.o. male who presents s/p L CEA.  The patient's neck incision is healing with no stroke symptoms. I discussed in depth with the patient the nature of atherosclerosis, and emphasized the importance of maximal medical management including strict control of blood pressure, blood glucose, and lipid levels, obtaining regular exercise, and cessation of smoking.  The patient is aware that without maximal medical management the underlying atherosclerotic disease process will progress, limiting the benefit of any interventions. The patient's surveillance will included routine carotid duplex studies which will be completed in: 3 months, at which time the patient will be re-evaluated.   I emphasized the importance of routine surveillance of the carotid arteries as recurrence of stenosis is possible, especially with proper management of underlying atherosclerotic disease. The patient agrees to participate in their maximal medical care and routine surveillance.  Thank you for allowing [place] to participate in this patient's care.  Leonides Sake, MD [facility] [place] Office: [phone] Pager: [phone]

## 2011-09-20 DIAGNOSIS — H04129 Dry eye syndrome of unspecified lacrimal gland: Secondary | ICD-10-CM | POA: Diagnosis not present

## 2011-09-20 DIAGNOSIS — H35369 Drusen (degenerative) of macula, unspecified eye: Secondary | ICD-10-CM | POA: Diagnosis not present

## 2011-09-20 DIAGNOSIS — H353190 Nonexudative age-related macular degeneration, unspecified eye, stage unspecified: Secondary | ICD-10-CM | POA: Diagnosis not present

## 2011-09-26 DIAGNOSIS — M545 Low back pain: Secondary | ICD-10-CM | POA: Diagnosis not present

## 2011-10-18 DIAGNOSIS — R05 Cough: Secondary | ICD-10-CM | POA: Diagnosis not present

## 2011-10-18 DIAGNOSIS — J209 Acute bronchitis, unspecified: Secondary | ICD-10-CM | POA: Diagnosis not present

## 2011-10-23 DIAGNOSIS — R05 Cough: Secondary | ICD-10-CM | POA: Diagnosis not present

## 2011-10-23 DIAGNOSIS — J209 Acute bronchitis, unspecified: Secondary | ICD-10-CM | POA: Diagnosis not present

## 2011-10-23 DIAGNOSIS — J441 Chronic obstructive pulmonary disease with (acute) exacerbation: Secondary | ICD-10-CM | POA: Diagnosis not present

## 2011-10-23 DIAGNOSIS — J45901 Unspecified asthma with (acute) exacerbation: Secondary | ICD-10-CM | POA: Diagnosis not present

## 2011-10-24 ENCOUNTER — Other Ambulatory Visit: Payer: Self-pay | Admitting: Emergency Medicine

## 2011-10-24 DIAGNOSIS — C649 Malignant neoplasm of unspecified kidney, except renal pelvis: Secondary | ICD-10-CM

## 2011-11-09 DIAGNOSIS — H5200 Hypermetropia, unspecified eye: Secondary | ICD-10-CM | POA: Diagnosis not present

## 2011-11-09 DIAGNOSIS — H52229 Regular astigmatism, unspecified eye: Secondary | ICD-10-CM | POA: Diagnosis not present

## 2011-11-09 DIAGNOSIS — Z961 Presence of intraocular lens: Secondary | ICD-10-CM | POA: Diagnosis not present

## 2011-11-09 DIAGNOSIS — H04129 Dry eye syndrome of unspecified lacrimal gland: Secondary | ICD-10-CM | POA: Diagnosis not present

## 2011-11-16 DIAGNOSIS — C649 Malignant neoplasm of unspecified kidney, except renal pelvis: Secondary | ICD-10-CM | POA: Diagnosis not present

## 2011-11-21 ENCOUNTER — Other Ambulatory Visit: Payer: Self-pay | Admitting: Internal Medicine

## 2011-11-21 ENCOUNTER — Ambulatory Visit (HOSPITAL_COMMUNITY)
Admission: RE | Admit: 2011-11-21 | Discharge: 2011-11-21 | Disposition: A | Discharge status: Home / Self Care | Payer: Medicare Other | Source: Ambulatory Visit | Attending: Interventional Radiology | Admitting: Interventional Radiology

## 2011-11-21 ENCOUNTER — Encounter (HOSPITAL_COMMUNITY): Payer: Self-pay

## 2011-11-21 DIAGNOSIS — I700 Atherosclerosis of aorta: Secondary | ICD-10-CM | POA: Diagnosis not present

## 2011-11-21 DIAGNOSIS — Z9089 Acquired absence of other organs: Secondary | ICD-10-CM | POA: Diagnosis not present

## 2011-11-21 DIAGNOSIS — Z8546 Personal history of malignant neoplasm of prostate: Secondary | ICD-10-CM | POA: Diagnosis not present

## 2011-11-21 DIAGNOSIS — R091 Pleurisy: Secondary | ICD-10-CM | POA: Insufficient documentation

## 2011-11-21 DIAGNOSIS — C61 Malignant neoplasm of prostate: Secondary | ICD-10-CM | POA: Insufficient documentation

## 2011-11-21 DIAGNOSIS — Q619 Cystic kidney disease, unspecified: Secondary | ICD-10-CM | POA: Diagnosis not present

## 2011-11-21 MED ORDER — TIOTROPIUM BROMIDE MONOHYDRATE 18 MCG IN CAPS: 18.0000 ug | ORAL_CAPSULE | Freq: Every day | RESPIRATORY_TRACT | Status: DC

## 2011-11-21 MED ORDER — IOHEXOL 300 MG/ML  SOLN
100.0000 mL | Freq: Once | INTRAMUSCULAR | Status: AC | PRN
  Administered 2011-11-21: 100 mL via INTRAVENOUS

## 2011-11-29 ENCOUNTER — Ambulatory Visit
Admission: RE | Admit: 2011-11-29 | Discharge: 2011-11-29 | Disposition: A | Discharge status: Home / Self Care | Payer: Medicare Other | Source: Ambulatory Visit | Attending: Interventional Radiology | Admitting: Interventional Radiology

## 2011-11-29 ENCOUNTER — Other Ambulatory Visit: Payer: Medicare Other

## 2011-11-29 ENCOUNTER — Other Ambulatory Visit (HOSPITAL_COMMUNITY): Payer: Medicare Other

## 2011-11-29 DIAGNOSIS — C649 Malignant neoplasm of unspecified kidney, except renal pelvis: Secondary | ICD-10-CM | POA: Diagnosis not present

## 2011-11-29 DIAGNOSIS — Z5189 Encounter for other specified aftercare: Secondary | ICD-10-CM | POA: Diagnosis not present

## 2011-11-29 NOTE — Progress Notes (Signed)
Patient ID: Michael [name], male   DOB: [birth year], 78 y.o.   MRN: [id number]  ESTABLISHED PATIENT OFFICE VISIT  Chief Complaint: Status post percutaneous cryoablation of a left papillary renal carcinoma on 11/25/2010.  History: [name] returns for follow-up 12 months after treatment. He has no complaints.  Review of Systems: The patient denies fever or chills. He has had no hematuria, dysuria or flank pain. He denies numbness or parasthesias at the site of treatment.  Exam: Vital signs: Blood pressure 128/66, pulse 86, respirations 16, temperature 97.4, oxygen saturation 94% on room air. General: No distress. Abdomen: Soft and nontender. No flank tenderness.  Labs: BUN 12, creatinine 1.39 and estimated GFR 49 ml/minute on 11/16/2011.  Imaging: Follow-up CT was performed on 11/21/2011. This demonstrated stable ablation defect of the posterior left kidney without evidence of enhancement to suggest residual or recurrent tumor. An adjacent high density renal cyst is stable. Additional bilateral renal cysts are also stable.  Assessment and Plan: No evidence of tumor recurrence after cryoablation. The ablation defect was felt to be stable. By my measurements, the ablation defect is slightly smaller, currently measuring approximately 1.6 x 1.8 cm compared to 1.8 x 2.3 cm previously. In addition, the defect is better circumscribed and distinct in margination. I have recommended another follow-up in 1 year.

## 2011-11-30 NOTE — Progress Notes (Signed)
Denies hematuria.  Denies problems with urination.    Denies any pain related to cryoablation.  States that HCTZ was recently decreased to 1/2 of 25 mg tab QD.  Overall patient is doing well.

## 2011-12-05 DIAGNOSIS — E039 Hypothyroidism, unspecified: Secondary | ICD-10-CM | POA: Diagnosis not present

## 2011-12-05 DIAGNOSIS — I10 Essential (primary) hypertension: Secondary | ICD-10-CM | POA: Diagnosis not present

## 2011-12-11 DIAGNOSIS — J438 Other emphysema: Secondary | ICD-10-CM | POA: Diagnosis not present

## 2011-12-11 DIAGNOSIS — E039 Hypothyroidism, unspecified: Secondary | ICD-10-CM | POA: Diagnosis not present

## 2011-12-11 DIAGNOSIS — R141 Gas pain: Secondary | ICD-10-CM | POA: Diagnosis not present

## 2011-12-11 DIAGNOSIS — J441 Chronic obstructive pulmonary disease with (acute) exacerbation: Secondary | ICD-10-CM | POA: Diagnosis not present

## 2011-12-11 DIAGNOSIS — E782 Mixed hyperlipidemia: Secondary | ICD-10-CM | POA: Diagnosis not present

## 2011-12-11 DIAGNOSIS — I10 Essential (primary) hypertension: Secondary | ICD-10-CM | POA: Diagnosis not present

## 2011-12-11 DIAGNOSIS — J309 Allergic rhinitis, unspecified: Secondary | ICD-10-CM | POA: Diagnosis not present

## 2011-12-11 DIAGNOSIS — F17200 Nicotine dependence, unspecified, uncomplicated: Secondary | ICD-10-CM | POA: Diagnosis not present

## 2011-12-11 DIAGNOSIS — R05 Cough: Secondary | ICD-10-CM | POA: Diagnosis not present

## 2011-12-14 ENCOUNTER — Encounter: Payer: Self-pay | Admitting: Neurosurgery

## 2011-12-15 ENCOUNTER — Ambulatory Visit: Payer: Medicare Other | Admitting: Vascular Surgery

## 2011-12-15 ENCOUNTER — Ambulatory Visit (INDEPENDENT_AMBULATORY_CARE_PROVIDER_SITE_OTHER): Payer: Medicare Other | Admitting: Neurosurgery

## 2011-12-15 ENCOUNTER — Encounter: Payer: Self-pay | Admitting: Neurosurgery

## 2011-12-15 ENCOUNTER — Other Ambulatory Visit (INDEPENDENT_AMBULATORY_CARE_PROVIDER_SITE_OTHER): Payer: Medicare Other | Admitting: *Deleted

## 2011-12-15 ENCOUNTER — Other Ambulatory Visit: Payer: Medicare Other

## 2011-12-15 VITALS — BP 112/70 | HR 88 | Resp 16 | Ht 67.0 in | Wt 153.3 lb

## 2011-12-15 DIAGNOSIS — Z48812 Encounter for surgical aftercare following surgery on the circulatory system: Secondary | ICD-10-CM

## 2011-12-15 DIAGNOSIS — I6529 Occlusion and stenosis of unspecified carotid artery: Secondary | ICD-10-CM | POA: Diagnosis not present

## 2011-12-15 DIAGNOSIS — I6523 Occlusion and stenosis of bilateral carotid arteries: Secondary | ICD-10-CM

## 2011-12-15 NOTE — Addendum Note (Signed)
Addended by: Sharee Pimple on: 12/15/2011 03:47 PM   Modules accepted: Orders

## 2011-12-15 NOTE — Progress Notes (Signed)
[facility] [place] Carotid Office Note  CC: Postop carotid surveillance Referring Physician: Imogene Burn  History of Present Illness: 77 year old male patient of Dr. Imogene Burn who is status post left CEA in may of 2013. The patient denies any signs or symptoms of CVA, TIA, amaurosis fugax or any neural deficit. The patient denies any new medical diagnoses or other recent surgery.  Past Medical History  Diagnosis Date  . Hyperthyroidism 1978    RAI  . Shingles 2001  . Vitreous hemorrhage   . PVD (peripheral vascular disease)   . Other symptoms involving cardiovascular system   . Other and unspecified hyperlipidemia   . Unspecified essential hypertension   . Allergic rhinitis, cause unspecified   . Chronic airway obstruction, not elsewhere classified   . History of asbestos exposure   . Left renal mass   . Tobacco abuse   . Coronary atherosclerosis of native coronary artery     stent x 3-one drug eluting  . Depression   . Anemia   . Peripheral arterial disease   . CAD (coronary artery disease)   . Carotid artery occlusion   . Stroke   . Hypothyroidism   . Heart attack unsure, "quite a few"  . Shortness of breath   . Prostate cancer dx'd 05/2006    xrt/IMRT 2008  . Renal cancer dx'd 10/2010    cryoablation in 2012    ROS: [x]  Positive   [ ]  Denies    General: [ ]  Weight loss, [ ]  Fever, [ ]  chills Neurologic: [ ]  Dizziness, [ ]  Blackouts, [ ]  Seizure [ ]  Stroke, [ ]  "Mini stroke", [ ]  Slurred speech, [ ]  Temporary blindness; [ ]  weakness in arms or legs, [ ]  Hoarseness Cardiac: [ ]  Chest pain/pressure, [ ]  Shortness of breath at rest [ ]  Shortness of breath with exertion, [ ]  Atrial fibrillation or irregular heartbeat Vascular: [ ]  Pain in legs with walking, [ ]  Pain in legs at rest, [ ]  Pain in legs at night,  [ ]  Non-healing ulcer, [ ]  Blood clot in vein/DVT,   Pulmonary: [ ]  Home oxygen, [ ]  Productive cough, [ ]  Coughing up blood, [ ]  Asthma,  [ ]   Wheezing Musculoskeletal:  [ ]  Arthritis, [ ]  Low back pain, [ ]  Joint pain Hematologic: [ ]  Easy Bruising, [ ]  Anemia; [ ]  Hepatitis Gastrointestinal: [ ]  Blood in stool, [ ]  Gastroesophageal Reflux/heartburn, [ ]  Trouble swallowing Urinary: [ ]  chronic Kidney disease, [ ]  on HD - [ ]  MWF or [ ]  TTHS, [ ]  Burning with urination, [ ]  Difficulty urinating Skin: [ ]  Rashes, [ ]  Wounds Psychological: [ ]  Anxiety, [ ]  Depression   Social History History  Substance Use Topics  . Smoking status: Current Some Day Smoker -- 0.1 packs/day for 60 years    Types: Cigarettes  . Smokeless tobacco: Current User    Types: Chew   Comment: cessation info given and reviewed  . Alcohol Use: No    Family History Family History  Problem Relation Age of Onset  . Allergies    . Heart disease    . Cancer    . Coronary artery disease    . Coronary artery disease Brother   . Cancer Brother   . Heart disease Brother   . Heart attack Brother   . Anesthesia problems Neg Hx     Allergies  Allergen Reactions  . Ciprofloxacin     REACTION: nausea  .  Hydrocodone-Acetaminophen   . Iron Itching  . Lyrica (Pregabalin)   . Morphine And Related   . Oxycodone     hives  . Penicillins     REACTION: hives    Current Outpatient Prescriptions  Medication Sig Dispense Refill  . albuterol (PROAIR HFA) 108 (90 BASE) MCG/ACT inhaler Inhale 2 puffs into the lungs every 6 (six) hours as needed. For shortness of breath      . amLODipine (NORVASC) 10 MG tablet Take 1 tablet (10 mg total) by mouth daily.  30 tablet  0  . aspirin 81 MG tablet Take 81 mg by mouth daily.        Marland Kitchen atorvastatin (LIPITOR) 40 MG tablet Take 40 mg by mouth daily.        . cetirizine (ZYRTEC) 10 MG tablet Take 10 mg by mouth daily.       . citalopram (CELEXA) 20 MG tablet Take 20 mg by mouth daily.       . ferrous sulfate 325 (65 FE) MG tablet Take 325 mg by mouth at bedtime.       . Fluticasone-Salmeterol (ADVAIR DISKUS) 250-50  MCG/DOSE AEPB Inhale 1 puff into the lungs every 12 (twelve) hours.        . hydrochlorothiazide 25 MG tablet Take 25 mg by mouth daily. Take 1/2 tablet in a.m.      Marland Kitchen isosorbide mononitrate (IMDUR) 60 MG 24 hr tablet Take 1.5 tablets (90 mg total) by mouth daily.  30 tablet  0  . levothyroxine (SYNTHROID, LEVOTHROID) 125 MCG tablet Take 125 mcg by mouth daily.       . mometasone (NASONEX) 50 MCG/ACT nasal spray Place 2 sprays into the nose daily.  17 g  2  . Multiple Vitamin (MULTIVITAMIN) tablet Take 1 tablet by mouth daily.        . nitroGLYCERIN (NITROSTAT) 0.4 MG SL tablet Place 0.4 mg under the tongue every 5 (five) minutes as needed. For chest pain      . Omega-3 Fatty Acids (FISH OIL) 1200 MG CAPS Take 2 capsules by mouth 2 (two) times daily.        . ranitidine (ZANTAC) 150 MG tablet Take 150 mg by mouth 2 (two) times daily.       . Tamsulosin HCl (FLOMAX) 0.4 MG CAPS Take 0.4 mg by mouth daily.        . theophylline (THEODUR) 200 MG 12 hr tablet Take 100 mg by mouth 2 (two) times daily.      Marland Kitchen tiotropium (SPIRIVA) 18 MCG inhalation capsule Place 1 capsule (18 mcg total) into inhaler and inhale daily.  30 capsule  3  . traZODone (DESYREL) 100 MG tablet Take 100 mg by mouth at bedtime as needed. For sleep      . naproxen sodium (ANAPROX) 220 MG tablet Take 440 mg by mouth daily as needed. For pain        Physical Examination  Filed Vitals:   12/15/11 1520  BP: 112/70  Pulse: 88  Resp:     Body mass index is 24.01 kg/(m^2).  General:  WDWN in NAD Gait: Normal HEENT: WNL Eyes: Pupils equal Pulmonary: normal non-labored breathing , without Rales, rhonchi,  wheezing Cardiac: RRR, without  Murmurs, rubs or gallops; Abdomen: soft, NT, no masses Skin: no rashes, ulcers noted  Vascular Exam Pulses: 3+ radial pulses bilaterally Carotid bruits: Carotid pulses to auscultation no bruits are heard Extremities without ischemic changes, no Gangrene , no cellulitis; no open  wounds;   Musculoskeletal: no muscle wasting or atrophy   Neurologic: A&O X 3; Appropriate Affect ; SENSATION: normal; MOTOR FUNCTION:  moving all extremities equally. Speech is fluent/normal  Non-Invasive Vascular Imaging CAROTID DUPLEX 12/15/2011  Right ICA 40 - 59 % stenosis Left ICA 0 - 19% stenosis Stable, no change from previous exam  ASSESSMENT/PLAN: Asymptomatic patient status post left CEA doing well. The patient will followup here in 6 months for repeat carotid duplex per protocol, his questions were encouraged and answered, he is in agreement with this plan.  Lauree Chandler ANP  Clinic MD: Imogene Burn

## 2011-12-18 DIAGNOSIS — R143 Flatulence: Secondary | ICD-10-CM | POA: Diagnosis not present

## 2011-12-18 DIAGNOSIS — F17200 Nicotine dependence, unspecified, uncomplicated: Secondary | ICD-10-CM | POA: Diagnosis not present

## 2011-12-18 DIAGNOSIS — R141 Gas pain: Secondary | ICD-10-CM | POA: Diagnosis not present

## 2011-12-20 DIAGNOSIS — R05 Cough: Secondary | ICD-10-CM | POA: Diagnosis not present

## 2011-12-20 DIAGNOSIS — E039 Hypothyroidism, unspecified: Secondary | ICD-10-CM | POA: Diagnosis not present

## 2011-12-20 DIAGNOSIS — R141 Gas pain: Secondary | ICD-10-CM | POA: Diagnosis not present

## 2011-12-20 DIAGNOSIS — J45901 Unspecified asthma with (acute) exacerbation: Secondary | ICD-10-CM | POA: Diagnosis not present

## 2011-12-20 DIAGNOSIS — J309 Allergic rhinitis, unspecified: Secondary | ICD-10-CM | POA: Diagnosis not present

## 2011-12-20 DIAGNOSIS — E782 Mixed hyperlipidemia: Secondary | ICD-10-CM | POA: Diagnosis not present

## 2011-12-20 DIAGNOSIS — J438 Other emphysema: Secondary | ICD-10-CM | POA: Diagnosis not present

## 2011-12-20 DIAGNOSIS — F17200 Nicotine dependence, unspecified, uncomplicated: Secondary | ICD-10-CM | POA: Diagnosis not present

## 2011-12-20 DIAGNOSIS — J441 Chronic obstructive pulmonary disease with (acute) exacerbation: Secondary | ICD-10-CM | POA: Diagnosis not present

## 2011-12-25 ENCOUNTER — Other Ambulatory Visit: Payer: Self-pay | Admitting: *Deleted

## 2011-12-25 MED ORDER — ISOSORBIDE MONONITRATE ER 60 MG PO TB24: 90.0000 mg | ORAL_TABLET | Freq: Every day | ORAL | Status: DC

## 2012-01-08 ENCOUNTER — Ambulatory Visit: Payer: Medicare Other | Admitting: Cardiology

## 2012-01-11 ENCOUNTER — Encounter: Payer: Self-pay | Admitting: Cardiology

## 2012-01-11 ENCOUNTER — Ambulatory Visit (INDEPENDENT_AMBULATORY_CARE_PROVIDER_SITE_OTHER): Payer: Medicare Other | Admitting: Cardiology

## 2012-01-11 VITALS — BP 112/63 | HR 82 | Ht 68.0 in | Wt 156.4 lb

## 2012-01-11 DIAGNOSIS — I6529 Occlusion and stenosis of unspecified carotid artery: Secondary | ICD-10-CM | POA: Diagnosis not present

## 2012-01-11 DIAGNOSIS — I2510 Atherosclerotic heart disease of native coronary artery without angina pectoris: Secondary | ICD-10-CM | POA: Diagnosis not present

## 2012-01-11 NOTE — Progress Notes (Signed)
HPI The patient presents for follow up of CAD.  Since he was last seen here he has done well.  The patient denies any new symptoms such as chest discomfort, neck or arm discomfort. There has been no new shortness of breath, PND or orthopnea. There have been no reported palpitations, presyncope or syncope.  He is somewhat limited by his knees.  However, with his level of activity he gets none of his previous angina.    Allergies  Allergen Reactions  . Ciprofloxacin     REACTION: nausea  . Hydrocodone-Acetaminophen   . Iron Itching  . Lyrica (Pregabalin)   . Morphine And Related   . Oxycodone     hives  . Penicillins     REACTION: hives    Current Outpatient Prescriptions  Medication Sig Dispense Refill  . albuterol (PROAIR HFA) 108 (90 BASE) MCG/ACT inhaler Inhale 2 puffs into the lungs every 6 (six) hours as needed. For shortness of breath      . amLODipine (NORVASC) 10 MG tablet Take 1 tablet (10 mg total) by mouth daily.  30 tablet  0  . aspirin 81 MG tablet Take 81 mg by mouth daily.        Marland Kitchen atorvastatin (LIPITOR) 40 MG tablet Take 40 mg by mouth daily.        . cetirizine (ZYRTEC) 10 MG tablet Take 10 mg by mouth daily.       . citalopram (CELEXA) 20 MG tablet Take 20 mg by mouth daily.       . ferrous sulfate 325 (65 FE) MG tablet Take 325 mg by mouth at bedtime.       . Fluticasone-Salmeterol (ADVAIR DISKUS) 250-50 MCG/DOSE AEPB Inhale 1 puff into the lungs every 12 (twelve) hours.        . hydrochlorothiazide 25 MG tablet Take 25 mg by mouth daily. Take 1/2 tablet in a.m.      Marland Kitchen isosorbide mononitrate (IMDUR) 60 MG 24 hr tablet Take 1.5 tablets (90 mg total) by mouth daily.  45 tablet  3  . levothyroxine (SYNTHROID, LEVOTHROID) 125 MCG tablet Take 125 mcg by mouth daily.       . mometasone (NASONEX) 50 MCG/ACT nasal spray Place 2 sprays into the nose daily.  17 g  2  . Multiple Vitamin (MULTIVITAMIN) tablet Take 1 tablet by mouth daily.        . naproxen sodium (ANAPROX)  220 MG tablet Take 440 mg by mouth daily as needed. For pain      . nitroGLYCERIN (NITROSTAT) 0.4 MG SL tablet Place 0.4 mg under the tongue every 5 (five) minutes as needed. For chest pain      . Omega-3 Fatty Acids (FISH OIL) 1200 MG CAPS Take 2 capsules by mouth 2 (two) times daily.        . ranitidine (ZANTAC) 150 MG tablet Take 150 mg by mouth 2 (two) times daily.       . Tamsulosin HCl (FLOMAX) 0.4 MG CAPS Take 0.4 mg by mouth daily.        . theophylline (THEODUR) 200 MG 12 hr tablet Take 100 mg by mouth 2 (two) times daily.      Marland Kitchen tiotropium (SPIRIVA) 18 MCG inhalation capsule Place 1 capsule (18 mcg total) into inhaler and inhale daily.  30 capsule  3  . traZODone (DESYREL) 100 MG tablet Take 100 mg by mouth at bedtime as needed. For sleep        Past  Medical History  Diagnosis Date  . Hyperthyroidism 1978    RAI  . Shingles 2001  . Vitreous hemorrhage   . PVD (peripheral vascular disease)   . Other symptoms involving cardiovascular system   . Other and unspecified hyperlipidemia   . Unspecified essential hypertension   . Allergic rhinitis, cause unspecified   . Chronic airway obstruction, not elsewhere classified   . History of asbestos exposure   . Left renal mass   . Tobacco abuse   . Coronary atherosclerosis of native coronary artery     stent x 3-one drug eluting  . Depression   . Anemia   . Peripheral arterial disease   . CAD (coronary artery disease)   . Carotid artery occlusion   . Stroke   . Hypothyroidism   . Heart attack unsure, "quite a few"  . Shortness of breath   . Prostate cancer dx'd 05/2006    xrt/IMRT 2008  . Renal cancer dx'd 10/2010    cryoablation in 2012    Past Surgical History  Procedure Date  . Popliteal synovial cyst excision     knee  . Cholecystectomy   . Cervical disc surgery     fusion  . Partial knee arthroplasty     Right-replacement  . Nasal septum surgery     SMR  . Cardiac catheterization   . Coronary angioplasty      RCA   . Neck disk fusion 07/2002  . Eye surgery     bilat cataract  . Knee arthroplasty     bilateral  . Radioactive iodine     treatment for hyperthyroidism  . Endarterectomy 08/30/2011    Procedure: ENDARTERECTOMY CAROTID;  Surgeon: [name], MD;  Location: [facility] OR;  Service: Vascular;  Laterality: Left;  . Joint replacement 2005    Right partial Knee  . Joint replacement 2011    Left toatal Knee    ROS:  As stated in the HPI and negative for all other systems.  PHYSICAL EXAM BP 112/63  Pulse 82  Ht 5\' 8"  (1.727 m)  Wt 156 lb 6.4 oz (70.943 kg)  BMI 23.78 kg/m2  SpO2 95% GENERAL:  Well appearing HEENT:  Pupils equal round and reactive, fundi not visualized, oral mucosa unremarkable NECK:  No jugular venous distention, waveform within normal limits, carotid upstroke brisk and symmetric, right bruits, no thyromegaly LYMPHATICS:  No cervical, inguinal adenopathy LUNGS:  Clear to auscultation bilaterally BACK:  No CVA tenderness CHEST:  Unremarkable HEART:  PMI not displaced or sustained,S1 and S2 within normal limits, no S3, no S4, no clicks, no rubs, no murmurs ABD:  Flat, positive bowel sounds normal in frequency in pitch, no bruits, no rebound, no guarding, no midline pulsatile mass, no hepatomegaly, no splenomegaly EXT:  2 plus pulses throughout, no edema, no cyanosis no clubbing SKIN:  No rashes no nodules NEURO:  Cranial nerves II through XII grossly intact, motor grossly intact throughout PSYCH:  Cognitively intact, oriented to person place and time   ASSESSMENT AND PLAN   CAD, NATIVE VESSEL - The patient has no new sypmtoms.  No further cardiovascular testing is indicated.  We will continue with aggressive risk reduction and meds as listed.  HYPERTENSION, The blood pressure is at target. No change in medications is indicated. We will continue with therapeutic lifestyle changes (TLC).  TOBACCO USER - We discussed a specific strategy for tobacco cessation.   (Greater than three minutes discussing tobacco cessation.)  HYPERLIPIDEMIA-MIXED - Aggressive management  recommended with target LDL 70 or less, if feasible. I will defer to Juliette Alcide, MD  Carotid stenosis, asymptomatic -  He is s/p CEA and is followed by Dr. Imogene Burn

## 2012-01-11 NOTE — Patient Instructions (Addendum)

## 2012-01-19 DIAGNOSIS — M67919 Unspecified disorder of synovium and tendon, unspecified shoulder: Secondary | ICD-10-CM | POA: Diagnosis not present

## 2012-01-19 DIAGNOSIS — M1990 Unspecified osteoarthritis, unspecified site: Secondary | ICD-10-CM | POA: Diagnosis not present

## 2012-01-19 DIAGNOSIS — M719 Bursopathy, unspecified: Secondary | ICD-10-CM | POA: Diagnosis not present

## 2012-02-03 IMAGING — CT CT CHEST W/O CM
2 of 3 series · 15 of 36 positions shown, 18 images · IV contrast (Omnipaque 300)
Comparison: Chest radiograph 05/28/2010 and 09/08/2005

CLINICAL DATA: Asbestos exposure.  Chronic cough with worsening.

CT CHEST WITHOUT CONTRAST
TECHNIQUE: Multidetector CT imaging of the chest was performed
following the standard protocol without IV contrast.

[Series 2: chest routine with · axial · 0.75mm/px · z∈[-288,-12]mm · 12 of 65 slices shown, 15 images]
[im 5/65  mediastinal]
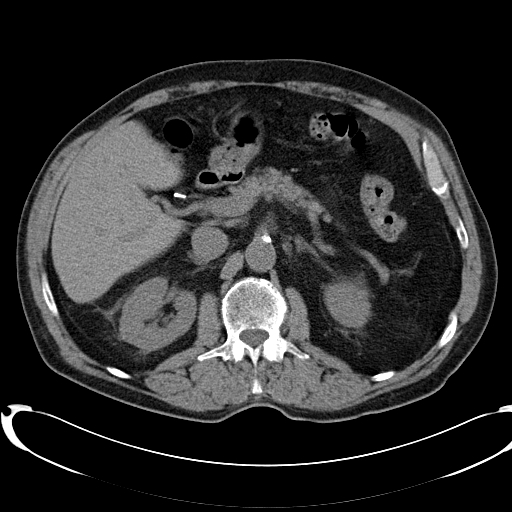
[im 5/65  lung]
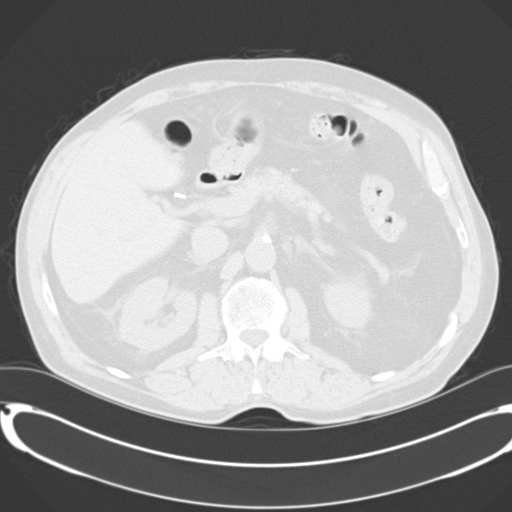
[im 10/65  lung]
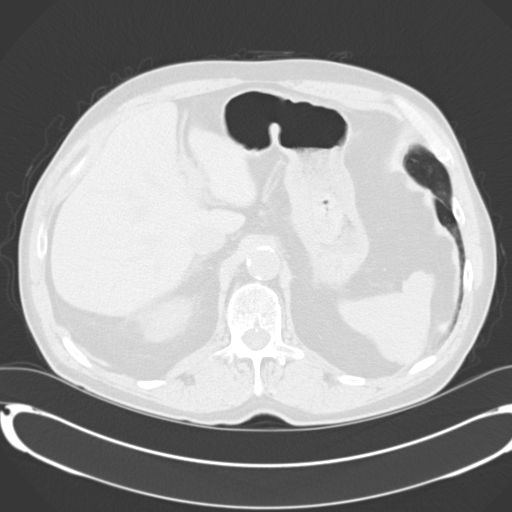
[im 15/65  lung]
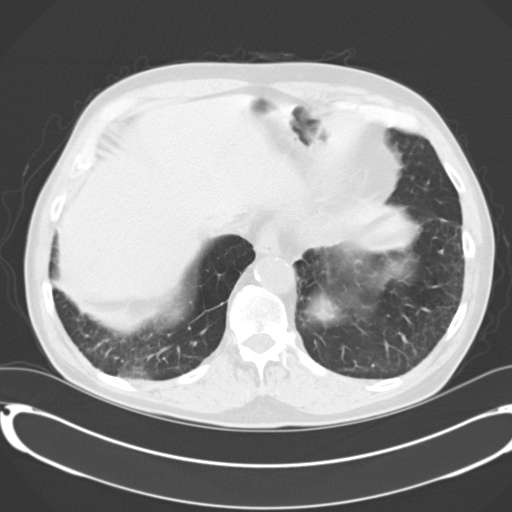
[im 19/65  lung]
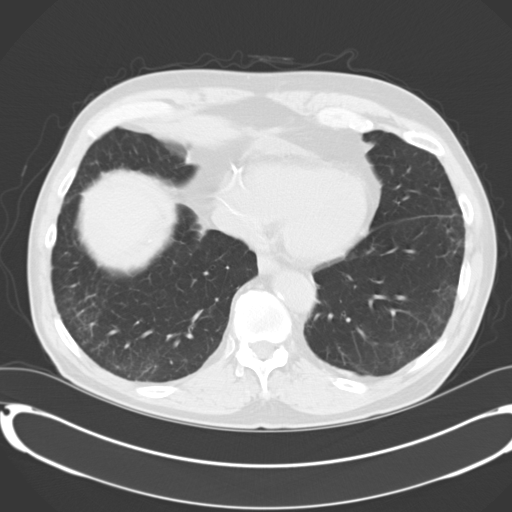
[im 24/65  mediastinal]
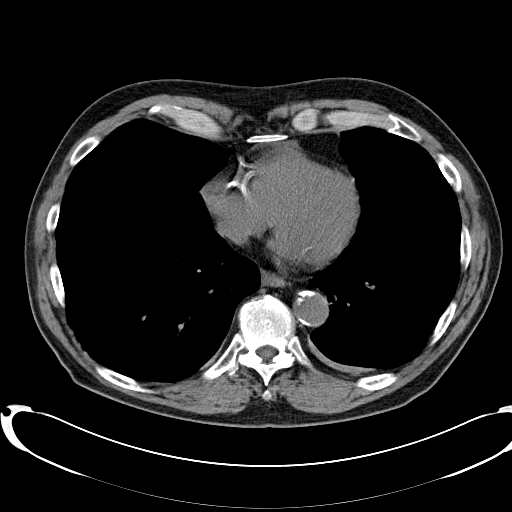
[im 24/65  lung]
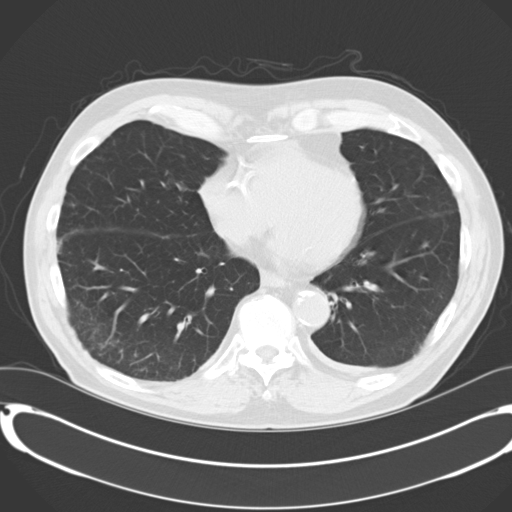
[im 29/65  lung]
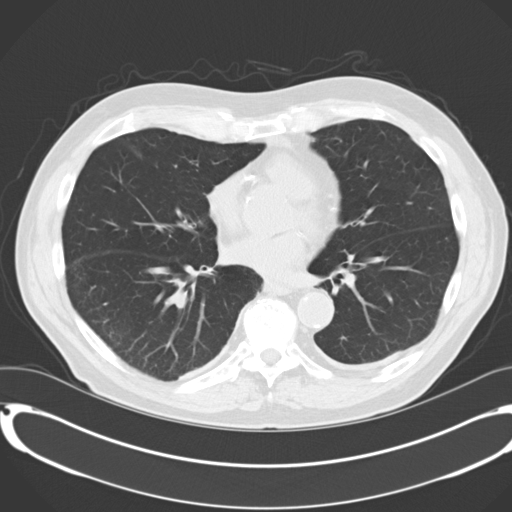
[im 36/65  lung]
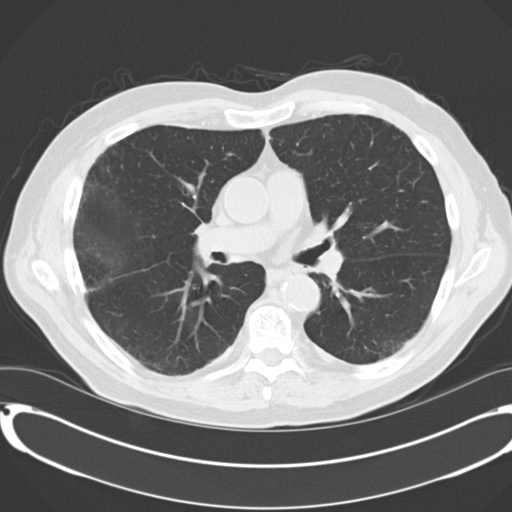
[im 41/65  lung]
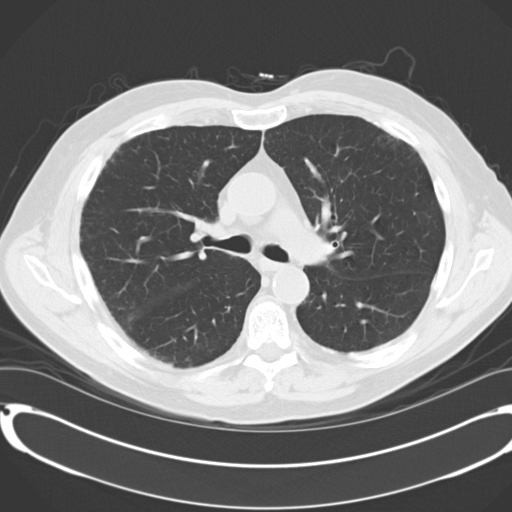
[im 46/65  mediastinal]
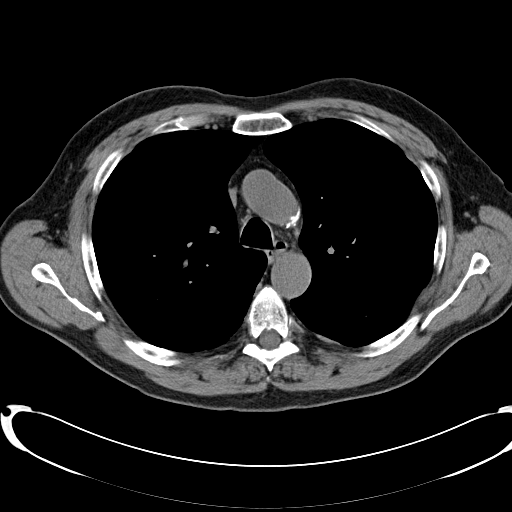
[im 46/65  lung]
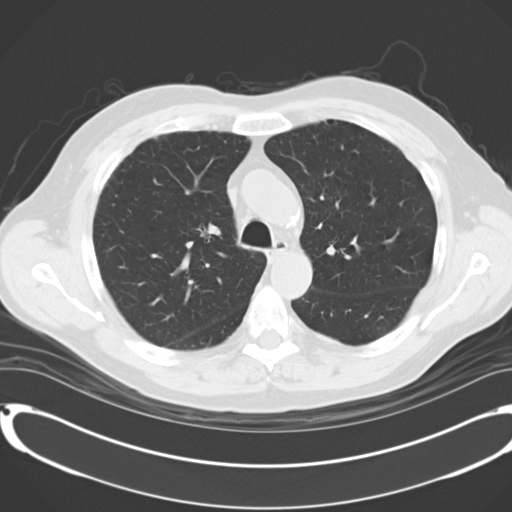
[im 50/65  lung]
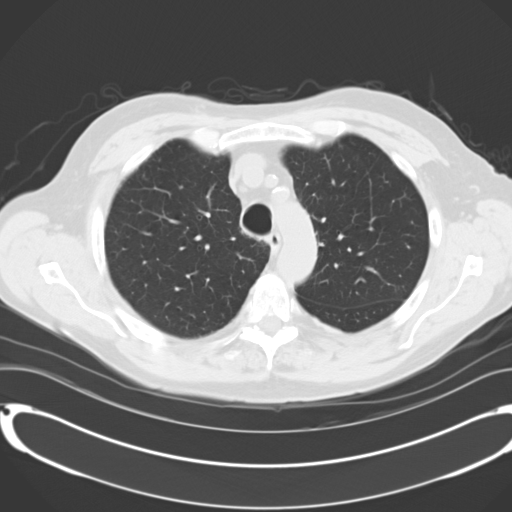
[im 55/65  lung]
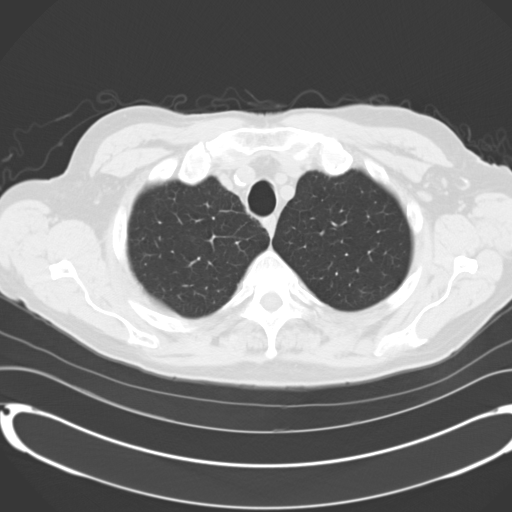
[im 60/65  lung]
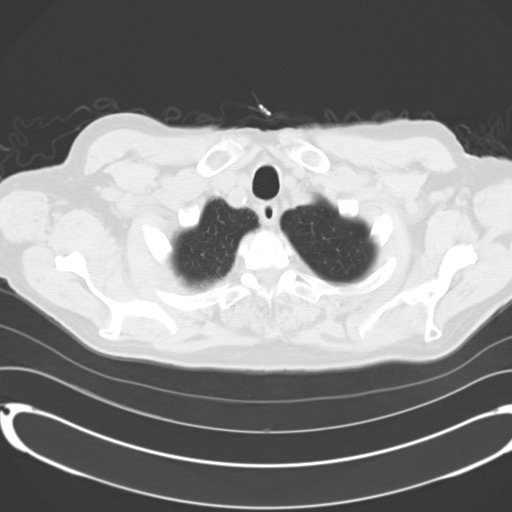

[Series 602: <mpr thick range> · coronal · 0.75mm/px · 3 of 122 slices shown]
[im 25/122  lung]
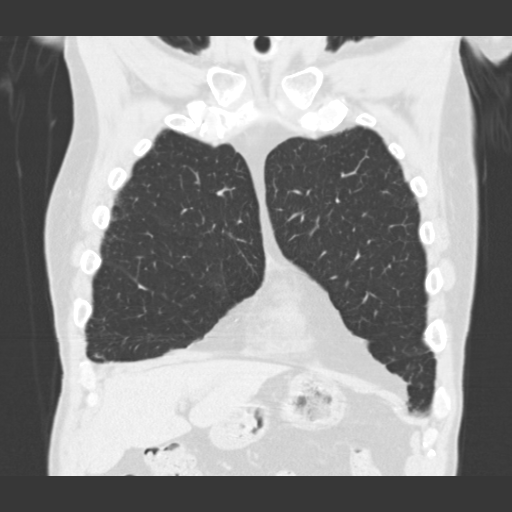
[im 49/122  lung]
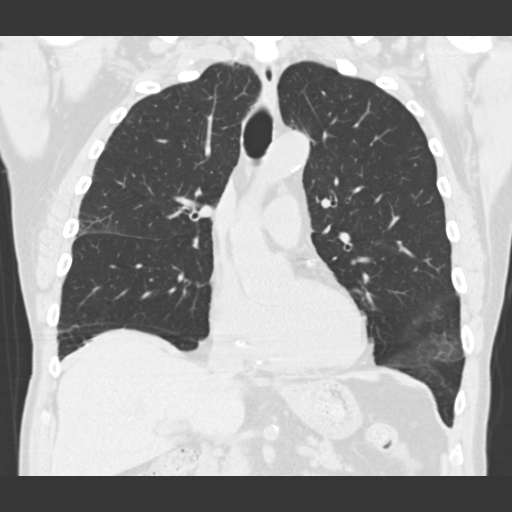
[im 73/122  lung]
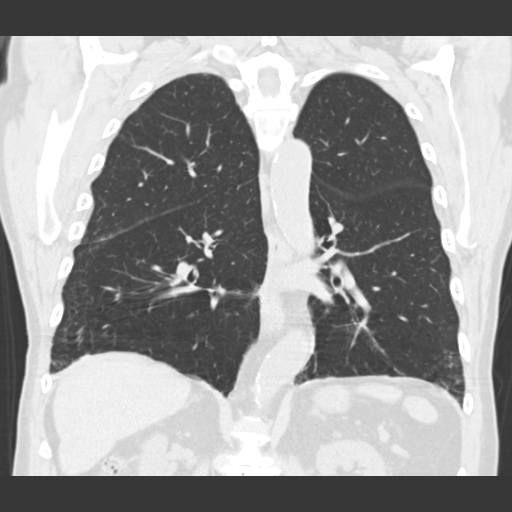

[15 of 36 positions shown; findings below may reference images not displayed]

FINDINGS: No pathologically enlarged mediastinal or axillary lymph
nodes.  Hilar regions are difficult to definitively evaluate
without IV contrast.  Coronary artery calcification.  Heart size
normal.  No pericardial effusion.

Bilateral pleural plaques are predominately noncalcified.  There is
mild subpleural reticulation bilaterally.  No pleural fluid.
Airway is unremarkable.

Incidental imaging of the upper abdomen shows a 8 mm low
attenuation lesion in the dome of the liver.  Low attenuation
lesions in the right kidney measure up to 1.2 cm and may represent
cysts. No worrisome lytic or sclerotic lesions.
IMPRESSION: 1.  Bilateral pleural plaques with subpleural reticulation.
Findings are indicative of asbestosis.
2.  Coronary artery calcification.

## 2012-02-15 DIAGNOSIS — Z23 Encounter for immunization: Secondary | ICD-10-CM | POA: Diagnosis not present

## 2012-03-01 ENCOUNTER — Ambulatory Visit (INDEPENDENT_AMBULATORY_CARE_PROVIDER_SITE_OTHER): Payer: Medicare Other | Admitting: Internal Medicine

## 2012-03-01 ENCOUNTER — Ambulatory Visit (INDEPENDENT_AMBULATORY_CARE_PROVIDER_SITE_OTHER)
Admission: RE | Admit: 2012-03-01 | Discharge: 2012-03-01 | Disposition: A | Discharge status: Home / Self Care | Payer: Medicare Other | Source: Ambulatory Visit | Attending: Internal Medicine | Admitting: Internal Medicine

## 2012-03-01 ENCOUNTER — Encounter: Payer: Self-pay | Admitting: Internal Medicine

## 2012-03-01 VITALS — BP 112/60 | HR 77 | Ht 68.0 in | Wt 158.0 lb

## 2012-03-01 DIAGNOSIS — D4959 Neoplasm of unspecified behavior of other genitourinary organ: Secondary | ICD-10-CM

## 2012-03-01 DIAGNOSIS — Z77090 Contact with and (suspected) exposure to asbestos: Secondary | ICD-10-CM | POA: Diagnosis not present

## 2012-03-01 DIAGNOSIS — J449 Chronic obstructive pulmonary disease, unspecified: Secondary | ICD-10-CM

## 2012-03-01 DIAGNOSIS — F17200 Nicotine dependence, unspecified, uncomplicated: Secondary | ICD-10-CM

## 2012-03-01 DIAGNOSIS — D49519 Neoplasm of unspecified behavior of unspecified kidney: Secondary | ICD-10-CM

## 2012-03-01 DIAGNOSIS — J4489 Other specified chronic obstructive pulmonary disease: Secondary | ICD-10-CM

## 2012-03-01 DIAGNOSIS — C61 Malignant neoplasm of prostate: Secondary | ICD-10-CM | POA: Diagnosis not present

## 2012-03-01 DIAGNOSIS — C649 Malignant neoplasm of unspecified kidney, except renal pelvis: Secondary | ICD-10-CM | POA: Diagnosis not present

## 2012-03-01 NOTE — Patient Instructions (Addendum)
Order- CXR   Dx renal neoplasm 239.5, COPD  Please try hard to stop smoking. We want you to be able to keep dancing! The electronic cigarettes will be a way to change your habits.  Please call as needed

## 2012-03-01 NOTE — Progress Notes (Signed)
Patient ID: Michael [name], male    DOB: [birth year], 78 y.o.   MRN: [id number] PCP Dr Leandrew Koyanagi HPI 08/14/00- 18 yo M current smoker, coming for f/u of COPD and allergic rhinitis. Had pneumonia about a month ago and brings CXR from 07/18/10 done by [name], showing COPD but NAD on my screen. . Still smokes 1 Pack/ 3days.  Admits daily morning cough, usually clear or slight yellow sputum but no blood. Admits some SOB, but exercises 3 days/ week at rehab center. Wheezes some. Continues Advair, Spiriva. Proair doesn't help a lot. Had cardiac evaluation/ stress test by cardiologist- "ok".  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 He brings a newsletter from [employer] and from [place] and [place], and from them he asks about CT to assess for asbestos related changes.   09/23/10 Smoker with hx asbestos exposure, COPD, allergic rhinitis. Newly dx'd renal cancer- to see [name]. Daughter here He notes a little cough occasionallly , not much. Denies chest pain, palpitation.He has not really tried to stop smoking. I reviewed his CT abd which included some lung level views and shared the images with them today. No lung abnormality described, but there is significant atherosclerosis.   02/03/11- Smoker with hx asbestos exposure, COPD, allergic rhinitis. Newly dx'd renal cancer  .. family here Had cryosurgery for his left renal cancer.- so far so good. Has had flu vaccine. Had chest x-ray since last here. Chest feels better with theophylline which was well tolerated. He still smokes one half pack per day we discussed trying again. We suggested nicotine replacement approaches since I notice he is chewing gum today.  08/24/11- 79 yoM Smoker with hx asbestos exposure, COPD, allergic rhinitis.  renal cancer  .. daughter here   PCP [name]. [name] Still smoking one quarter pack per day. Blames pollen for head congestion and sneeze. Admits gradually worsening dyspnea  on exertion. Coughs more in drafts or after exertion. Daily cough productive of white or yellow sputum with no blood and no chest pain. Spiriva continues to be helpful. Has 80% left carotid lesion, pending evaluation with Dr. Imogene Burn. Hx bilateral TKR, but does exercise on bike and treadmill. CXR- 08/23/11 CHEST - 2 VIEW  Comparison: 03/03/2011  Findings: COPD. Pulmonary hyperinflation is present with mild  scarring. Negative for pneumonia or mass lesion. Negative for  heart failure or effusion. Surgical fusion is present.  IMPRESSION:  COPD. No acute cardiopulmonary disease.  Original Report Authenticated By: Camelia Phenes, M.D.   03/01/12- 87 yoM Smoker with hx asbestos exposure, COPD, allergic rhinitis.  renal cancer  .. daughter here  Slight cough at times.   PCP [name] Had CEA in the Spring- did ok.  Has had flu vaccine Light and occasional cough. No change in dyspnea with exertion. Still goes dancing some. Has not tried to stop smoking but we worked on this again today. I'm hoping we can get him motivated to try. His urologist/Dr. Laverle Patter asked him to get another chest x-ray. Patient chooses to get that done while here today COPD assessment test (CAT) score 9/40 CXR 08/22/11-reviewed IMPRESSION:  COPD. No acute cardiopulmonary disease.  Original Report Authenticated By: Camelia Phenes, M.D.   Review of Systems-See HPI Constitutional:   No-   weight loss, night sweats, fevers, chills, fatigue, lassitude. HEENT:   No-  headaches, difficulty swallowing, tooth/dental problems, sore throat,       No-  sneezing, itching, ear ache,  +nasal congestion, post nasal drip,  CV:  No-   chest pain, orthopnea, PND, swelling in lower extremities, anasarca, dizziness, palpitations Resp: + shortness of breath with exertion or at rest.              + productive cough, little non-productive cough,  No-  coughing up of blood.              No-   change in color of mucus.  No- wheezing.     Skin: No-   rash or lesions. GI:  No-   heartburn, indigestion, abdominal pain, nausea, vomiting,  GU:  MS:  No-   joint pain or swelling.  Neuro- nothing unusual Psych:  No- change in mood or affect. No depression or anxiety.  No memory loss.  OBJ General- Alert, Oriented, Affect-appropriate, Distress- none acute; normal weight Skin- rash-none, lesions- none, excoriation- none Lymphadenopathy- none Head- atraumatic            Eyes- Gross vision intact, PERRLA, conjunctivae clear secretions            Ears- Hearing, canals-normal            Nose- Clear, no-Septal dev, mucus, polyps, erosion, perforation             Throat- Mallampati II , mucosa clear , drainage- none, tonsils- atrophic Neck- flexible , trachea midline, no stridor , thyroid nl, carotid no bruit audible by me today Chest - symmetrical excursion , unlabored           Heart/CV- RRR , no murmur , no gallop  , no rub, nl s1 s2                           - JVD- none , edema- none, stasis changes- none, varices- none           Lung- decreased breath sounds, unlabored, + raspy breath sounds, wheeze + trace, slight dry cough , dullness-none, rub- none           Chest wall-  Abd-  Br/ Gen/ Rectal- Not done, not indicated Extrem- cyanosis- none, clubbing, none, atrophy- none, strength- nl Neuro- grossly intact to observation

## 2012-03-08 DIAGNOSIS — D4959 Neoplasm of unspecified behavior of other genitourinary organ: Secondary | ICD-10-CM | POA: Diagnosis not present

## 2012-03-08 DIAGNOSIS — F17200 Nicotine dependence, unspecified, uncomplicated: Secondary | ICD-10-CM | POA: Diagnosis not present

## 2012-03-08 DIAGNOSIS — E782 Mixed hyperlipidemia: Secondary | ICD-10-CM | POA: Diagnosis not present

## 2012-03-08 DIAGNOSIS — E039 Hypothyroidism, unspecified: Secondary | ICD-10-CM | POA: Diagnosis not present

## 2012-03-08 DIAGNOSIS — C61 Malignant neoplasm of prostate: Secondary | ICD-10-CM | POA: Diagnosis not present

## 2012-03-08 DIAGNOSIS — I708 Atherosclerosis of other arteries: Secondary | ICD-10-CM | POA: Diagnosis not present

## 2012-03-08 DIAGNOSIS — R141 Gas pain: Secondary | ICD-10-CM | POA: Diagnosis not present

## 2012-03-08 DIAGNOSIS — N529 Male erectile dysfunction, unspecified: Secondary | ICD-10-CM | POA: Diagnosis not present

## 2012-03-13 DIAGNOSIS — J441 Chronic obstructive pulmonary disease with (acute) exacerbation: Secondary | ICD-10-CM | POA: Diagnosis not present

## 2012-03-13 DIAGNOSIS — I10 Essential (primary) hypertension: Secondary | ICD-10-CM | POA: Diagnosis not present

## 2012-03-13 DIAGNOSIS — J309 Allergic rhinitis, unspecified: Secondary | ICD-10-CM | POA: Diagnosis not present

## 2012-03-13 DIAGNOSIS — R143 Flatulence: Secondary | ICD-10-CM | POA: Diagnosis not present

## 2012-03-13 DIAGNOSIS — E782 Mixed hyperlipidemia: Secondary | ICD-10-CM | POA: Diagnosis not present

## 2012-03-13 DIAGNOSIS — J438 Other emphysema: Secondary | ICD-10-CM | POA: Diagnosis not present

## 2012-03-13 DIAGNOSIS — E039 Hypothyroidism, unspecified: Secondary | ICD-10-CM | POA: Diagnosis not present

## 2012-03-13 DIAGNOSIS — R141 Gas pain: Secondary | ICD-10-CM | POA: Diagnosis not present

## 2012-03-13 DIAGNOSIS — J45901 Unspecified asthma with (acute) exacerbation: Secondary | ICD-10-CM | POA: Diagnosis not present

## 2012-03-13 DIAGNOSIS — F17200 Nicotine dependence, unspecified, uncomplicated: Secondary | ICD-10-CM | POA: Diagnosis not present

## 2012-03-13 NOTE — Assessment & Plan Note (Signed)
Symptomatically unchanged, but his luck will not hold if he continues to smoke

## 2012-03-13 NOTE — Assessment & Plan Note (Signed)
A little over some reasonable smoking cessation tools including electronic cigarettes and patches. If he can begin to believe quitting is possible then he may be willing to try.

## 2012-03-13 NOTE — Assessment & Plan Note (Signed)
Plan-obtain chest x-ray

## 2012-03-14 NOTE — Progress Notes (Signed)
Quick Note:  Pt aware of results. ______ 

## 2012-03-22 DIAGNOSIS — M545 Low back pain: Secondary | ICD-10-CM | POA: Diagnosis not present

## 2012-03-22 DIAGNOSIS — M5137 Other intervertebral disc degeneration, lumbosacral region: Secondary | ICD-10-CM | POA: Diagnosis not present

## 2012-03-27 DIAGNOSIS — M25569 Pain in unspecified knee: Secondary | ICD-10-CM | POA: Diagnosis not present

## 2012-04-09 DIAGNOSIS — M47817 Spondylosis without myelopathy or radiculopathy, lumbosacral region: Secondary | ICD-10-CM | POA: Diagnosis not present

## 2012-04-15 DIAGNOSIS — R05 Cough: Secondary | ICD-10-CM | POA: Diagnosis not present

## 2012-04-15 DIAGNOSIS — J309 Allergic rhinitis, unspecified: Secondary | ICD-10-CM | POA: Diagnosis not present

## 2012-04-15 DIAGNOSIS — J438 Other emphysema: Secondary | ICD-10-CM | POA: Diagnosis not present

## 2012-04-18 DIAGNOSIS — J309 Allergic rhinitis, unspecified: Secondary | ICD-10-CM | POA: Diagnosis not present

## 2012-04-29 ENCOUNTER — Other Ambulatory Visit: Payer: Self-pay | Admitting: Cardiology

## 2012-04-29 DIAGNOSIS — M5137 Other intervertebral disc degeneration, lumbosacral region: Secondary | ICD-10-CM | POA: Diagnosis not present

## 2012-04-29 MED ORDER — ISOSORBIDE MONONITRATE ER 60 MG PO TB24: 90.0000 mg | ORAL_TABLET | Freq: Every day | ORAL | Status: DC

## 2012-05-14 DIAGNOSIS — J449 Chronic obstructive pulmonary disease, unspecified: Secondary | ICD-10-CM | POA: Diagnosis not present

## 2012-05-14 DIAGNOSIS — R079 Chest pain, unspecified: Secondary | ICD-10-CM

## 2012-05-14 DIAGNOSIS — E785 Hyperlipidemia, unspecified: Secondary | ICD-10-CM | POA: Diagnosis not present

## 2012-05-14 DIAGNOSIS — Z79899 Other long term (current) drug therapy: Secondary | ICD-10-CM | POA: Diagnosis not present

## 2012-05-14 DIAGNOSIS — Z888 Allergy status to other drugs, medicaments and biological substances status: Secondary | ICD-10-CM | POA: Diagnosis not present

## 2012-05-14 DIAGNOSIS — IMO0002 Reserved for concepts with insufficient information to code with codable children: Secondary | ICD-10-CM | POA: Diagnosis not present

## 2012-05-14 DIAGNOSIS — I2510 Atherosclerotic heart disease of native coronary artery without angina pectoris: Secondary | ICD-10-CM | POA: Diagnosis not present

## 2012-05-14 DIAGNOSIS — Z7982 Long term (current) use of aspirin: Secondary | ICD-10-CM | POA: Diagnosis not present

## 2012-05-14 DIAGNOSIS — Z886 Allergy status to analgesic agent status: Secondary | ICD-10-CM | POA: Diagnosis not present

## 2012-05-14 DIAGNOSIS — F411 Generalized anxiety disorder: Secondary | ICD-10-CM | POA: Diagnosis not present

## 2012-05-14 DIAGNOSIS — R0789 Other chest pain: Secondary | ICD-10-CM | POA: Diagnosis not present

## 2012-05-14 DIAGNOSIS — Z8546 Personal history of malignant neoplasm of prostate: Secondary | ICD-10-CM | POA: Diagnosis not present

## 2012-05-14 DIAGNOSIS — Z880 Allergy status to penicillin: Secondary | ICD-10-CM | POA: Diagnosis not present

## 2012-05-14 DIAGNOSIS — R799 Abnormal finding of blood chemistry, unspecified: Secondary | ICD-10-CM | POA: Diagnosis not present

## 2012-05-14 DIAGNOSIS — Z9861 Coronary angioplasty status: Secondary | ICD-10-CM | POA: Diagnosis not present

## 2012-05-14 DIAGNOSIS — D649 Anemia, unspecified: Secondary | ICD-10-CM | POA: Diagnosis not present

## 2012-05-14 DIAGNOSIS — F17200 Nicotine dependence, unspecified, uncomplicated: Secondary | ICD-10-CM | POA: Diagnosis not present

## 2012-05-14 DIAGNOSIS — M1990 Unspecified osteoarthritis, unspecified site: Secondary | ICD-10-CM | POA: Diagnosis not present

## 2012-05-14 DIAGNOSIS — E039 Hypothyroidism, unspecified: Secondary | ICD-10-CM | POA: Diagnosis not present

## 2012-05-14 DIAGNOSIS — I10 Essential (primary) hypertension: Secondary | ICD-10-CM | POA: Diagnosis not present

## 2012-05-14 DIAGNOSIS — F329 Major depressive disorder, single episode, unspecified: Secondary | ICD-10-CM | POA: Diagnosis not present

## 2012-05-15 DIAGNOSIS — R079 Chest pain, unspecified: Secondary | ICD-10-CM | POA: Diagnosis not present

## 2012-06-06 DIAGNOSIS — F17200 Nicotine dependence, unspecified, uncomplicated: Secondary | ICD-10-CM | POA: Diagnosis not present

## 2012-06-06 DIAGNOSIS — J438 Other emphysema: Secondary | ICD-10-CM | POA: Diagnosis not present

## 2012-06-10 LAB — PULMONARY FUNCTION TEST

## 2012-06-18 DIAGNOSIS — Z961 Presence of intraocular lens: Secondary | ICD-10-CM | POA: Diagnosis not present

## 2012-06-18 DIAGNOSIS — H524 Presbyopia: Secondary | ICD-10-CM | POA: Diagnosis not present

## 2012-06-18 DIAGNOSIS — H353190 Nonexudative age-related macular degeneration, unspecified eye, stage unspecified: Secondary | ICD-10-CM | POA: Diagnosis not present

## 2012-06-18 DIAGNOSIS — H43819 Vitreous degeneration, unspecified eye: Secondary | ICD-10-CM | POA: Diagnosis not present

## 2012-07-01 ENCOUNTER — Other Ambulatory Visit (HOSPITAL_COMMUNITY): Payer: Self-pay | Admitting: Orthopedic Surgery

## 2012-07-11 ENCOUNTER — Encounter (HOSPITAL_COMMUNITY): Payer: Medicare Other

## 2012-07-23 ENCOUNTER — Other Ambulatory Visit: Payer: Self-pay | Admitting: Internal Medicine

## 2012-07-23 MED ORDER — TIOTROPIUM BROMIDE MONOHYDRATE 18 MCG IN CAPS: 18.0000 ug | ORAL_CAPSULE | Freq: Every day | RESPIRATORY_TRACT | Status: DC

## 2012-07-30 DIAGNOSIS — M545 Low back pain: Secondary | ICD-10-CM | POA: Diagnosis not present

## 2012-07-30 DIAGNOSIS — J019 Acute sinusitis, unspecified: Secondary | ICD-10-CM | POA: Diagnosis not present

## 2012-08-01 ENCOUNTER — Other Ambulatory Visit: Payer: Self-pay | Admitting: Internal Medicine

## 2012-08-01 MED ORDER — TIOTROPIUM BROMIDE MONOHYDRATE 18 MCG IN CAPS: 18.0000 ug | ORAL_CAPSULE | Freq: Every day | RESPIRATORY_TRACT | Status: DC

## 2012-08-01 NOTE — Telephone Encounter (Signed)
rx request from [facility] spiriva 90 day supply rx sent

## 2012-08-30 ENCOUNTER — Ambulatory Visit: Payer: Medicare Other | Admitting: Internal Medicine

## 2012-08-30 DIAGNOSIS — C61 Malignant neoplasm of prostate: Secondary | ICD-10-CM | POA: Diagnosis not present

## 2012-09-03 DIAGNOSIS — M1990 Unspecified osteoarthritis, unspecified site: Secondary | ICD-10-CM | POA: Diagnosis not present

## 2012-09-03 DIAGNOSIS — M719 Bursopathy, unspecified: Secondary | ICD-10-CM | POA: Diagnosis not present

## 2012-09-03 DIAGNOSIS — I781 Nevus, non-neoplastic: Secondary | ICD-10-CM | POA: Diagnosis not present

## 2012-09-03 DIAGNOSIS — L821 Other seborrheic keratosis: Secondary | ICD-10-CM | POA: Diagnosis not present

## 2012-09-04 DIAGNOSIS — L821 Other seborrheic keratosis: Secondary | ICD-10-CM | POA: Diagnosis not present

## 2012-09-06 DIAGNOSIS — C61 Malignant neoplasm of prostate: Secondary | ICD-10-CM | POA: Diagnosis not present

## 2012-09-06 DIAGNOSIS — D4959 Neoplasm of unspecified behavior of other genitourinary organ: Secondary | ICD-10-CM | POA: Diagnosis not present

## 2012-09-06 DIAGNOSIS — M1710 Unilateral primary osteoarthritis, unspecified knee: Secondary | ICD-10-CM | POA: Diagnosis not present

## 2012-09-11 DIAGNOSIS — J309 Allergic rhinitis, unspecified: Secondary | ICD-10-CM | POA: Diagnosis not present

## 2012-09-11 DIAGNOSIS — R142 Eructation: Secondary | ICD-10-CM | POA: Diagnosis not present

## 2012-09-11 DIAGNOSIS — E039 Hypothyroidism, unspecified: Secondary | ICD-10-CM | POA: Diagnosis not present

## 2012-09-11 DIAGNOSIS — J441 Chronic obstructive pulmonary disease with (acute) exacerbation: Secondary | ICD-10-CM | POA: Diagnosis not present

## 2012-09-11 DIAGNOSIS — I10 Essential (primary) hypertension: Secondary | ICD-10-CM | POA: Diagnosis not present

## 2012-09-11 DIAGNOSIS — F17200 Nicotine dependence, unspecified, uncomplicated: Secondary | ICD-10-CM | POA: Diagnosis not present

## 2012-09-11 DIAGNOSIS — J438 Other emphysema: Secondary | ICD-10-CM | POA: Diagnosis not present

## 2012-09-11 DIAGNOSIS — E782 Mixed hyperlipidemia: Secondary | ICD-10-CM | POA: Diagnosis not present

## 2012-09-13 DIAGNOSIS — E785 Hyperlipidemia, unspecified: Secondary | ICD-10-CM | POA: Diagnosis not present

## 2012-09-13 DIAGNOSIS — I2510 Atherosclerotic heart disease of native coronary artery without angina pectoris: Secondary | ICD-10-CM | POA: Diagnosis not present

## 2012-09-13 DIAGNOSIS — E039 Hypothyroidism, unspecified: Secondary | ICD-10-CM | POA: Diagnosis not present

## 2012-09-13 DIAGNOSIS — Z880 Allergy status to penicillin: Secondary | ICD-10-CM | POA: Diagnosis not present

## 2012-09-13 DIAGNOSIS — Z833 Family history of diabetes mellitus: Secondary | ICD-10-CM | POA: Diagnosis not present

## 2012-09-13 DIAGNOSIS — Z8546 Personal history of malignant neoplasm of prostate: Secondary | ICD-10-CM | POA: Diagnosis not present

## 2012-09-13 DIAGNOSIS — Z885 Allergy status to narcotic agent status: Secondary | ICD-10-CM | POA: Diagnosis not present

## 2012-09-13 DIAGNOSIS — D649 Anemia, unspecified: Secondary | ICD-10-CM | POA: Diagnosis not present

## 2012-09-13 DIAGNOSIS — J449 Chronic obstructive pulmonary disease, unspecified: Secondary | ICD-10-CM | POA: Diagnosis not present

## 2012-09-13 DIAGNOSIS — I10 Essential (primary) hypertension: Secondary | ICD-10-CM | POA: Diagnosis not present

## 2012-09-13 DIAGNOSIS — E871 Hypo-osmolality and hyponatremia: Secondary | ICD-10-CM | POA: Diagnosis not present

## 2012-09-13 DIAGNOSIS — F329 Major depressive disorder, single episode, unspecified: Secondary | ICD-10-CM | POA: Diagnosis not present

## 2012-09-13 DIAGNOSIS — F17200 Nicotine dependence, unspecified, uncomplicated: Secondary | ICD-10-CM | POA: Diagnosis not present

## 2012-09-13 DIAGNOSIS — Z79899 Other long term (current) drug therapy: Secondary | ICD-10-CM | POA: Diagnosis not present

## 2012-09-13 DIAGNOSIS — Z7982 Long term (current) use of aspirin: Secondary | ICD-10-CM | POA: Diagnosis not present

## 2012-09-13 DIAGNOSIS — Z9861 Coronary angioplasty status: Secondary | ICD-10-CM | POA: Diagnosis not present

## 2012-09-13 DIAGNOSIS — D72829 Elevated white blood cell count, unspecified: Secondary | ICD-10-CM | POA: Diagnosis not present

## 2012-09-13 DIAGNOSIS — Z8249 Family history of ischemic heart disease and other diseases of the circulatory system: Secondary | ICD-10-CM | POA: Diagnosis not present

## 2012-09-13 DIAGNOSIS — Z886 Allergy status to analgesic agent status: Secondary | ICD-10-CM | POA: Diagnosis not present

## 2012-09-13 DIAGNOSIS — R079 Chest pain, unspecified: Secondary | ICD-10-CM | POA: Diagnosis not present

## 2012-09-13 DIAGNOSIS — J309 Allergic rhinitis, unspecified: Secondary | ICD-10-CM | POA: Diagnosis not present

## 2012-09-13 DIAGNOSIS — J4489 Other specified chronic obstructive pulmonary disease: Secondary | ICD-10-CM | POA: Diagnosis not present

## 2012-09-14 DIAGNOSIS — R079 Chest pain, unspecified: Secondary | ICD-10-CM | POA: Diagnosis not present

## 2012-09-18 DIAGNOSIS — M719 Bursopathy, unspecified: Secondary | ICD-10-CM | POA: Diagnosis not present

## 2012-09-18 DIAGNOSIS — F17200 Nicotine dependence, unspecified, uncomplicated: Secondary | ICD-10-CM | POA: Diagnosis not present

## 2012-09-18 DIAGNOSIS — I708 Atherosclerosis of other arteries: Secondary | ICD-10-CM | POA: Diagnosis not present

## 2012-09-18 DIAGNOSIS — I10 Essential (primary) hypertension: Secondary | ICD-10-CM | POA: Diagnosis not present

## 2012-09-18 DIAGNOSIS — E782 Mixed hyperlipidemia: Secondary | ICD-10-CM | POA: Diagnosis not present

## 2012-09-18 DIAGNOSIS — J438 Other emphysema: Secondary | ICD-10-CM | POA: Diagnosis not present

## 2012-09-18 DIAGNOSIS — E039 Hypothyroidism, unspecified: Secondary | ICD-10-CM | POA: Diagnosis not present

## 2012-09-18 DIAGNOSIS — M67919 Unspecified disorder of synovium and tendon, unspecified shoulder: Secondary | ICD-10-CM | POA: Diagnosis not present

## 2012-09-18 DIAGNOSIS — M1990 Unspecified osteoarthritis, unspecified site: Secondary | ICD-10-CM | POA: Diagnosis not present

## 2012-09-19 DIAGNOSIS — H35379 Puckering of macula, unspecified eye: Secondary | ICD-10-CM | POA: Diagnosis not present

## 2012-09-19 DIAGNOSIS — H00029 Hordeolum internum unspecified eye, unspecified eyelid: Secondary | ICD-10-CM | POA: Diagnosis not present

## 2012-09-25 DIAGNOSIS — Z79899 Other long term (current) drug therapy: Secondary | ICD-10-CM | POA: Diagnosis not present

## 2012-09-25 DIAGNOSIS — Z8546 Personal history of malignant neoplasm of prostate: Secondary | ICD-10-CM | POA: Diagnosis not present

## 2012-09-25 DIAGNOSIS — K219 Gastro-esophageal reflux disease without esophagitis: Secondary | ICD-10-CM | POA: Diagnosis not present

## 2012-09-25 DIAGNOSIS — R1031 Right lower quadrant pain: Secondary | ICD-10-CM | POA: Diagnosis not present

## 2012-09-25 DIAGNOSIS — K7689 Other specified diseases of liver: Secondary | ICD-10-CM | POA: Diagnosis not present

## 2012-09-25 DIAGNOSIS — F17200 Nicotine dependence, unspecified, uncomplicated: Secondary | ICD-10-CM | POA: Diagnosis not present

## 2012-09-25 DIAGNOSIS — R109 Unspecified abdominal pain: Secondary | ICD-10-CM | POA: Diagnosis not present

## 2012-09-25 DIAGNOSIS — R066 Hiccough: Secondary | ICD-10-CM | POA: Diagnosis not present

## 2012-09-25 DIAGNOSIS — Z7982 Long term (current) use of aspirin: Secondary | ICD-10-CM | POA: Diagnosis not present

## 2012-09-25 DIAGNOSIS — R630 Anorexia: Secondary | ICD-10-CM | POA: Diagnosis not present

## 2012-10-07 ENCOUNTER — Ambulatory Visit (INDEPENDENT_AMBULATORY_CARE_PROVIDER_SITE_OTHER): Payer: Medicare Other | Admitting: Internal Medicine

## 2012-10-07 ENCOUNTER — Encounter: Payer: Self-pay | Admitting: Internal Medicine

## 2012-10-07 VITALS — BP 136/58 | HR 85 | Ht 66.5 in | Wt 158.0 lb

## 2012-10-07 DIAGNOSIS — F17200 Nicotine dependence, unspecified, uncomplicated: Secondary | ICD-10-CM

## 2012-10-07 DIAGNOSIS — J438 Other emphysema: Secondary | ICD-10-CM

## 2012-10-07 DIAGNOSIS — Z77090 Contact with and (suspected) exposure to asbestos: Secondary | ICD-10-CM | POA: Diagnosis not present

## 2012-10-07 DIAGNOSIS — J439 Emphysema, unspecified: Secondary | ICD-10-CM

## 2012-10-07 NOTE — Progress Notes (Signed)
Patient ID: Michael [name], male    DOB: 1932-03-04, 79 y.o.   MRN: [id number] PCP [name] HPI 08/14/00- 46 yo M current smoker, coming for f/u of COPD and allergic rhinitis. Had pneumonia about a month ago and brings CXR from 07/18/10 done by [name], showing COPD but NAD on my screen. . Still smokes 1 Pack/ 3days.  Admits daily morning cough, usually clear or slight yellow sputum but no blood. Admits some SOB, but exercises 3 days/ week at rehab center. Wheezes some. Continues Advair, Spiriva. Proair doesn't help a lot. Had cardiac evaluation/ stress test by cardiologist- "ok".  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 He brings a newsletter from [employer] and from [place], and from them he asks about CT to assess for asbestos related changes.   09/23/10 Smoker with hx asbestos exposure, COPD, allergic rhinitis. Newly dx'd renal cancer- to see [name]. Daughter here He notes a little cough occasionallly , not much. Denies chest pain, palpitation.He has not really tried to stop smoking. I reviewed his CT abd which included some lung level views and shared the images with them today. No lung abnormality described, but there is significant atherosclerosis.   02/03/11- Smoker with hx asbestos exposure, COPD, allergic rhinitis. Newly dx'd renal cancer  .. family here Had cryosurgery for his left renal cancer.- so far so good. Has had flu vaccine. Had chest x-ray since last here. Chest feels better with theophylline which was well tolerated. He still smokes one half pack per day we discussed trying again. We suggested nicotine replacement approaches since I notice he is chewing gum today.  08/24/11- 48 yoM Smoker with hx asbestos exposure, COPD, allergic rhinitis.  renal cancer  .. daughter here   PCP [name]. [name] Still smoking one quarter pack per day. Blames pollen for head congestion and sneeze. Admits gradually worsening dyspnea  on exertion. Coughs more in drafts or after exertion. Daily cough productive of white or yellow sputum with no blood and no chest pain. Spiriva continues to be helpful. Has 80% left carotid lesion, pending evaluation with Dr. [name]. Hx bilateral TKR, but does exercise on bike and treadmill. CXR- 08/23/11 CHEST - 2 VIEW  Comparison: 03/03/2011  Findings: COPD. Pulmonary hyperinflation is present with mild  scarring. Negative for pneumonia or mass lesion. Negative for  heart failure or effusion. Surgical fusion is present.  IMPRESSION:  COPD. No acute cardiopulmonary disease.  Original Report Authenticated By: [name], M.D.   03/01/12- 25 yoM Smoker with hx asbestos exposure, COPD, allergic rhinitis.  renal cancer  .. daughter here  Slight cough at times.   PCP [name] Had CEA in the Spring- did ok.  Has had flu vaccine Light and occasional cough. No change in dyspnea with exertion. Still goes dancing some. Has not tried to stop smoking but we worked on this again today. I'm hoping we can get him motivated to try. His urologist/Dr. Alinda Money asked him to get another chest x-ray. Patient chooses to get that done while here today COPD assessment test (CAT) score 9/40 CXR 08/22/11-reviewed IMPRESSION:  COPD. No acute cardiopulmonary disease.  Original Report Authenticated By: [name], M.D.    10/07/12- 5 yoM Smoker with hx asbestos exposure, COPD, allergic rhinitis.  renal cancer   DTR [name] here FOLLOWS FOR: wheezing while lying down, SOB early in the morning, cough-productive-brown in color. So smoking 1  pack per week against counseling. Is not trying to cut down below this. He reports a [facility] at [facility] in May of 2014. We are seeking that report but he does not understand there was any problem. PFT: 06/10/2012/[place]-mild obstructive airways disease with insignificant response to bronchodilator, hyperinflation and air trapping, diffusion  moderately reduced. FVC 3.36/87%, FEV1 2.20/88%, FEV1/FVC of 0.66, TLC 121%, RV 148%, DLCO 54%. CXR 03/14/12 Findings: Unchanged linear lingular scarring. The lungs are clear.  No pulmonary edema, focal airspace consolidation or pulmonary  nodule. Chronic coarsening of the interstitial markings remains  unchanged. There is pectus excavatum on the lateral view.  Incompletely imaged anterior cervical fusion hardware at C7.  Tortuous thoracic aorta. Atherosclerotic calcifications in the  transverse aorta. No acute osseous abnormality.  IMPRESSION:  No acute osseous abnormality.  Pectus excavatum.  Original Report Authenticated By: [name], M.D. . Review of Systems-See HPI Constitutional:   No-   weight loss, night sweats, fevers, chills, fatigue, lassitude. HEENT:   No-  headaches, difficulty swallowing, tooth/dental problems, sore throat,       No-  sneezing, itching, ear ache,  +nasal congestion, post nasal drip,  CV:  No-   chest pain, orthopnea, PND, swelling in lower extremities, anasarca, dizziness, palpitations Resp: + shortness of breath with exertion or at rest.              + productive cough, little non-productive cough,  No-  coughing up of blood.              No-   change in color of mucus.  + wheezing.   Skin: No-   rash or lesions. GI:  No-   heartburn, indigestion, abdominal pain, nausea, vomiting,  GU:  MS:  No-   joint pain or swelling.  Neuro- nothing unusual Psych:  No- change in mood or affect. No depression or anxiety.  No memory loss.  OBJ General- Alert, Oriented, Affect-appropriate, Distress- none acute; normal weight Skin- rash-none, lesions- none, excoriation- none Lymphadenopathy- none Head- atraumatic            Eyes- Gross vision intact, PERRLA, conjunctivae clear secretions            Ears- Hearing, canals-normal            Nose- Clear, no-Septal dev, mucus, polyps, erosion, perforation             Throat- Mallampati II , mucosa clear ,  drainage- none, tonsils- atrophic Neck- flexible , trachea midline, no stridor , thyroid nl,  Chest - symmetrical excursion , unlabored           Heart/CV- RRR , no murmur , no gallop  , no rub, nl s1 s2                           - JVD- none , edema- none, stasis changes- none, varices- none.                              + L carotid bruit (hx L CEA 2013)           Lung- decreased breath sounds, unlabored, + raspy breath sounds, wheeze + , no- cough ,            dullness-none, rub- none           Chest wall-  Abd-  Br/ Gen/ Rectal-  Not done, not indicated Extrem- cyanosis- none, clubbing, none, atrophy- none, strength- nl Neuro- grossly intact to observation

## 2012-10-07 NOTE — Patient Instructions (Addendum)
We wil get the report from your May, 2014 CXR at [facility] for our record.  We can continue present meds.   You will still be better off if you stop smoking  I think I hear a faint squirting noise (bruit) at your left carotid artery. You will folow up on this with your vascular surgeon as planned  Please call as needed

## 2012-10-09 ENCOUNTER — Encounter: Payer: Self-pay | Admitting: Physician Assistant

## 2012-10-09 ENCOUNTER — Telehealth: Payer: Self-pay | Admitting: Physician Assistant

## 2012-10-09 ENCOUNTER — Encounter: Payer: Self-pay | Admitting: *Deleted

## 2012-10-09 ENCOUNTER — Ambulatory Visit (INDEPENDENT_AMBULATORY_CARE_PROVIDER_SITE_OTHER): Payer: Medicare Other | Admitting: Physician Assistant

## 2012-10-09 ENCOUNTER — Other Ambulatory Visit: Payer: Self-pay | Admitting: *Deleted

## 2012-10-09 VITALS — BP 113/63 | HR 87 | Ht 67.0 in | Wt 156.4 lb

## 2012-10-09 DIAGNOSIS — I10 Essential (primary) hypertension: Secondary | ICD-10-CM

## 2012-10-09 DIAGNOSIS — I6529 Occlusion and stenosis of unspecified carotid artery: Secondary | ICD-10-CM | POA: Diagnosis not present

## 2012-10-09 DIAGNOSIS — F17200 Nicotine dependence, unspecified, uncomplicated: Secondary | ICD-10-CM | POA: Diagnosis not present

## 2012-10-09 DIAGNOSIS — E785 Hyperlipidemia, unspecified: Secondary | ICD-10-CM | POA: Diagnosis not present

## 2012-10-09 DIAGNOSIS — I2510 Atherosclerotic heart disease of native coronary artery without angina pectoris: Secondary | ICD-10-CM

## 2012-10-09 DIAGNOSIS — I6523 Occlusion and stenosis of bilateral carotid arteries: Secondary | ICD-10-CM

## 2012-10-09 DIAGNOSIS — I658 Occlusion and stenosis of other precerebral arteries: Secondary | ICD-10-CM

## 2012-10-09 NOTE — Assessment & Plan Note (Signed)
Will evaluate further with an exercise stress Cardiolite test for risk stratification. If this is a low risk study, then continued medical therapy is recommended.

## 2012-10-09 NOTE — Progress Notes (Signed)
Primary Cardiologist: Rollene Rotunda, MD   HPI: Patient presents for routine followup of known CAD, last seen here in clinic in September 2013, by Dr. Antoine Poche. No medication adjustments or further cardiac testing recommended at that time.  Patient presents with interim history of perhaps some worsening of occasional chest tightness; however, he denies any strict correlation with exertion. In fact, he continues to exercise regularly with the cardiac rehabilitation program, 3 times a week, reporting no associated CP. He also denies any CP with walking. Symptoms appear to be unpredictable in onset, resolving spontaneously. He has not had to use any NTG.  Patient states that he quit smoking yesterday.   Allergies  Allergen Reactions  . Ciprofloxacin     REACTION: nausea  . Hydrocodone-Acetaminophen   . Iron Itching  . Lyrica (Pregabalin) Other (See Comments)  . Morphine And Related   . Oxycodone     hives  . Penicillins     REACTION: hives    Current Outpatient Prescriptions  Medication Sig Dispense Refill  . albuterol (PROAIR HFA) 108 (90 BASE) MCG/ACT inhaler Inhale 2 puffs into the lungs every 6 (six) hours as needed. For shortness of breath      . amLODipine (NORVASC) 10 MG tablet Take 1 tablet (10 mg total) by mouth daily.  30 tablet  0  . aspirin 81 MG tablet Take 81 mg by mouth daily.        Marland Kitchen atorvastatin (LIPITOR) 40 MG tablet Take 40 mg by mouth daily.        . cetirizine (ZYRTEC) 10 MG tablet Take 10 mg by mouth daily.       . citalopram (CELEXA) 20 MG tablet Take 20 mg by mouth daily.       . ferrous sulfate 325 (65 FE) MG tablet Take 325 mg by mouth at bedtime.       . Fluticasone-Salmeterol (ADVAIR DISKUS) 250-50 MCG/DOSE AEPB Inhale 1 puff into the lungs every 12 (twelve) hours.        . hydrochlorothiazide 25 MG tablet Take 12.5 mg by mouth daily.       . isosorbide mononitrate (IMDUR) 60 MG 24 hr tablet Take 1.5 tablets (90 mg total) by mouth daily.  45 tablet  3  .  levothyroxine (SYNTHROID, LEVOTHROID) 125 MCG tablet Take 125 mcg by mouth daily.       . mometasone (NASONEX) 50 MCG/ACT nasal spray Place 2 sprays into the nose daily.  17 g  2  . Multiple Vitamin (MULTIVITAMIN) tablet Take 1 tablet by mouth daily.        . naproxen sodium (ANAPROX) 220 MG tablet Take 440 mg by mouth daily as needed. For pain      . nitroGLYCERIN (NITROSTAT) 0.4 MG SL tablet Place 0.4 mg under the tongue every 5 (five) minutes as needed. For chest pain      . Omega-3 Fatty Acids (FISH OIL) 1200 MG CAPS Take 2 capsules by mouth 2 (two) times daily.        . ranitidine (ZANTAC) 150 MG tablet Take 150 mg by mouth 2 (two) times daily.       . Tamsulosin HCl (FLOMAX) 0.4 MG CAPS Take 0.4 mg by mouth daily.        . theophylline (THEODUR) 200 MG 12 hr tablet Take 100 mg by mouth 2 (two) times daily.      Marland Kitchen tiotropium (SPIRIVA) 18 MCG inhalation capsule Place 1 capsule (18 mcg total) into inhaler and inhale daily.  90 capsule  2  . traZODone (DESYREL) 100 MG tablet Take 100 mg by mouth at bedtime as needed. For sleep       No current facility-administered medications for this visit.    Past Medical History  Diagnosis Date  . Hyperthyroidism 1978    RAI  . Shingles 2001  . Vitreous hemorrhage   . PVD (peripheral vascular disease)   . Other symptoms involving cardiovascular system   . Other and unspecified hyperlipidemia   . Unspecified essential hypertension   . Allergic rhinitis, cause unspecified   . Chronic airway obstruction, not elsewhere classified   . History of asbestos exposure   . Left renal mass   . Tobacco abuse   . Coronary atherosclerosis of native coronary artery     stent x 3-one drug eluting  . Depression   . Anemia   . Peripheral arterial disease   . CAD (coronary artery disease)   . Carotid artery occlusion   . Stroke   . Hypothyroidism   . Heart attack unsure, "quite a few"  . Shortness of breath   . Prostate cancer dx'd 05/2006    xrt/IMRT 2008   . Renal cancer dx'd 10/2010    cryoablation in 2012    Past Surgical History  Procedure Laterality Date  . Popliteal synovial cyst excision      knee  . Cholecystectomy    . Cervical disc surgery      fusion  . Partial knee arthroplasty      Right-replacement  . Nasal septum surgery      SMR  . Coronary angioplasty      RCA   . Neck disk fusion  07/2002  . Eye surgery      bilat cataract  . Knee arthroplasty      bilateral  . Radioactive iodine      treatment for hyperthyroidism  . Endarterectomy  08/30/2011    Procedure: ENDARTERECTOMY CAROTID;  Surgeon: [name], MD;  Location: [facility] OR;  Service: Vascular;  Laterality: Left;  . Joint replacement  2005    Right partial Knee  . Joint replacement  2011    Left toatal Knee    History   Social History  . Marital Status: Widowed    Spouse Name: N/A    Number of Children: N/A  . Years of Education: N/A   Occupational History  . Retired      [employer]   Social History Main Topics  . Smoking status: Former Smoker -- 0.10 packs/day for 60 years    Types: Cigarettes  . Smokeless tobacco: Current User    Types: Chew     Comment: not smoked any since yesterday, trying to quit  . Alcohol Use: No  . Drug Use: No  . Sexually Active: Not on file   Other Topics Concern  . Not on file   Social History Narrative  . No narrative on file    Family History  Problem Relation Age of Onset  . Allergies    . Heart disease    . Cancer    . Coronary artery disease    . Coronary artery disease Brother   . Cancer Brother   . Heart disease Brother   . Heart attack Brother   . Anesthesia problems Neg Hx     ROS: no nausea, vomiting; no fever, chills; no melena, hematochezia; no claudication  PHYSICAL EXAM: BP 113/63  Pulse 87  Ht 5\' 7"  (1.702 m)  Wt 156 lb 6.4 oz (70.943 kg)  BMI 24.49 kg/m2  SpO2 97% GENERAL: 79 year old male; NAD HEENT: NCAT, PERRLA, EOMI; sclera clear; no xanthelasma NECK:  palpable bilateral carotid pulses, soft right-sided bruit; ; left CEA scar; no JVD LUNGS: CTA bilaterally CARDIAC: RRR (S1, S2); no significant murmurs; no rubs or gallops ABDOMEN: soft, non-tender; intact BS EXTREMETIES: no significant peripheral edema SKIN: warm/dry; no obvious rash/lesions MUSCULOSKELETAL: no joint deformity NEURO: no focal deficit; NL affect   EKG:    ASSESSMENT & PLAN:  CAD, NATIVE VESSEL Will evaluate further with an exercise stress Cardiolite test for risk stratification. If this is a low risk study, then continued medical therapy is recommended.  TOBACCO USER Patient reports that he stopped smoking yesterday.  Carotid stenosis, bilateral Followed regularly by Dr. Launa Grill On statin therapy, followed by primary M.D.  HYPERTENSION, UNSPECIFIED Well-controlled on current medication regimen    Gene Laquilla Dault, PAC

## 2012-10-09 NOTE — Patient Instructions (Signed)
   GXT (graded exercise test) Cardiolite - stress test  Office will contact with results via phone or letter.   Continue all current medications. Follow up in  2 weeks

## 2012-10-09 NOTE — Telephone Encounter (Signed)
GXT (graded exercise test) Cardiolite - stress test scheduled 10-15-12 @ [facility]

## 2012-10-09 NOTE — Assessment & Plan Note (Signed)
Followed regularly by Dr. Imogene Burn

## 2012-10-09 NOTE — Assessment & Plan Note (Signed)
Well-controlled on current medication regimen 

## 2012-10-09 NOTE — Assessment & Plan Note (Signed)
On statin therapy, followed by primary M.D. 

## 2012-10-09 NOTE — Assessment & Plan Note (Signed)
Patient reports that he stopped smoking yesterday.

## 2012-10-15 DIAGNOSIS — I2510 Atherosclerotic heart disease of native coronary artery without angina pectoris: Secondary | ICD-10-CM

## 2012-10-15 DIAGNOSIS — F17200 Nicotine dependence, unspecified, uncomplicated: Secondary | ICD-10-CM | POA: Diagnosis not present

## 2012-10-17 ENCOUNTER — Telehealth: Payer: Self-pay | Admitting: *Deleted

## 2012-10-17 NOTE — Telephone Encounter (Signed)
Notes Recorded by Lesle Chris, LPN on 1/61/0960 at 10:30 AM Patient notified and verbalized understanding.

## 2012-10-17 NOTE — Telephone Encounter (Signed)
Message copied by Lesle Chris on Thu Oct 17, 2012 10:30 AM ------      Message from: Michael [name]      Created: Thu Oct 17, 2012  8:26 AM       Neg stress test. Continue current medication regimen. F/u as scheduled ------

## 2012-10-20 NOTE — Assessment & Plan Note (Signed)
I emphasized that the best thing he can do for his pulmonary and cardiovascular health to stop smoking.

## 2012-10-20 NOTE — Assessment & Plan Note (Signed)
Is a chronic bronchitis component to this which would improve if he stopped smoking. He has not demonstrated much responsiveness to bronchodilators

## 2012-10-20 NOTE — Assessment & Plan Note (Signed)
Head and chest x-ray at [facility] in 2014. We are requesting that report for our records. I have again emphasized the synergy between smoking and asbestos exposure.

## 2012-10-22 ENCOUNTER — Telehealth: Payer: Self-pay | Admitting: Emergency Medicine

## 2012-10-22 NOTE — Telephone Encounter (Signed)
PER PT DAUGHTER, PT WAS SEEN IN THE ER AT [facility], HAD CT, LABS AND VISIT.  PER DR YAMAGATA TO PUSH F/U CT AND  VISIT TO AUG,. 2014.  CALLED DAUGHTER WITH THE ABOVE INFO. AND WILL CALL HER WITH THE AUG. SCHEDULE IS AVA.

## 2012-10-25 ENCOUNTER — Encounter: Payer: Self-pay | Admitting: Internal Medicine

## 2012-10-31 ENCOUNTER — Other Ambulatory Visit (HOSPITAL_COMMUNITY): Payer: Self-pay | Admitting: Interventional Radiology

## 2012-10-31 DIAGNOSIS — N2889 Other specified disorders of kidney and ureter: Secondary | ICD-10-CM

## 2012-11-04 ENCOUNTER — Encounter: Payer: Self-pay | Admitting: Physician Assistant

## 2012-11-04 ENCOUNTER — Ambulatory Visit (INDEPENDENT_AMBULATORY_CARE_PROVIDER_SITE_OTHER): Payer: Medicare Other | Admitting: Physician Assistant

## 2012-11-04 VITALS — BP 125/67 | HR 84 | Ht 67.0 in | Wt 156.0 lb

## 2012-11-04 DIAGNOSIS — I6529 Occlusion and stenosis of unspecified carotid artery: Secondary | ICD-10-CM | POA: Diagnosis not present

## 2012-11-04 DIAGNOSIS — I10 Essential (primary) hypertension: Secondary | ICD-10-CM | POA: Diagnosis not present

## 2012-11-04 DIAGNOSIS — I2510 Atherosclerotic heart disease of native coronary artery without angina pectoris: Secondary | ICD-10-CM | POA: Diagnosis not present

## 2012-11-04 DIAGNOSIS — F17200 Nicotine dependence, unspecified, uncomplicated: Secondary | ICD-10-CM | POA: Diagnosis not present

## 2012-11-04 DIAGNOSIS — I6523 Occlusion and stenosis of bilateral carotid arteries: Secondary | ICD-10-CM

## 2012-11-04 DIAGNOSIS — I658 Occlusion and stenosis of other precerebral arteries: Secondary | ICD-10-CM

## 2012-11-04 DIAGNOSIS — E785 Hyperlipidemia, unspecified: Secondary | ICD-10-CM

## 2012-11-04 NOTE — Patient Instructions (Signed)
Continue all current medications. Your physician wants you to follow up in: 6 months.  You will receive a reminder letter in the mail one-two months in advance.  If you don't receive a letter, please call our office to schedule the follow up appointment   

## 2012-11-04 NOTE — Assessment & Plan Note (Signed)
On statin therapy, followed by primary M.D. 

## 2012-11-04 NOTE — Assessment & Plan Note (Signed)
Although he was able to stop for 3-4 days, he has since resumed smoking. Therefore, I once again explained the risks of continued tobacco smoking, and repeated my recommendation that he stop altogether. However, I also applauded him for having been able to stop for a short period of time, indicating that he may, in fact, eventually be able to do so on his own.

## 2012-11-04 NOTE — Assessment & Plan Note (Signed)
Well-controlled on current medication regimen 

## 2012-11-04 NOTE — Assessment & Plan Note (Signed)
Followed regularly by Dr. Imogene Burn

## 2012-11-04 NOTE — Assessment & Plan Note (Addendum)
No further cardiac workup indicated. Results of the recent NL adequate GXT Cardiolite were reviewed with the patient, and his daughter. He also has not had any recurrent chest discomfort, which in retrospect appeared to be atypical. I had documented that he did not experience any exertional CP, and did not during recent stress testing. Therefore, he is cleared to proceed with recommended R TKR, with Dr. Ollen Gross, with date not yet scheduled. He is deemed to be at an acceptable risk from a cardiac standpoint.

## 2012-11-04 NOTE — Progress Notes (Signed)
Primary Cardiologist: Rollene Rotunda, MD   HPI: Scheduled 2 week followup and review of elective GXT Cardiolite study, results as follows:   - GXT Cardiolite (89% MPHR), June 17: NL study; EF 68%  Patient denies any recurrent chest discomfort, since his last OV. He also happily reported that he quit smoking tobacco, albeit for only 3-4 days, but is still trying to stop altogether on his own.  Allergies  Allergen Reactions  . Ciprofloxacin     REACTION: nausea  . Hydrocodone-Acetaminophen   . Iron Itching  . Lyrica (Pregabalin) Other (See Comments)  . Morphine And Related   . Oxycodone     hives  . Penicillins     REACTION: hives    Current Outpatient Prescriptions  Medication Sig Dispense Refill  . albuterol (PROAIR HFA) 108 (90 BASE) MCG/ACT inhaler Inhale 2 puffs into the lungs every 6 (six) hours as needed. For shortness of breath      . amLODipine (NORVASC) 10 MG tablet Take 1 tablet (10 mg total) by mouth daily.  30 tablet  0  . aspirin 81 MG tablet Take 81 mg by mouth daily.        Marland Kitchen atorvastatin (LIPITOR) 40 MG tablet Take 40 mg by mouth daily.        . cetirizine (ZYRTEC) 10 MG tablet Take 10 mg by mouth daily.       . citalopram (CELEXA) 20 MG tablet Take 20 mg by mouth daily.       . ferrous sulfate 325 (65 FE) MG tablet Take 325 mg by mouth at bedtime.       . Fluticasone-Salmeterol (ADVAIR DISKUS) 250-50 MCG/DOSE AEPB Inhale 1 puff into the lungs every 12 (twelve) hours.        . hydrochlorothiazide 25 MG tablet Take 12.5 mg by mouth daily.       . isosorbide mononitrate (IMDUR) 60 MG 24 hr tablet Take 1.5 tablets (90 mg total) by mouth daily.  45 tablet  3  . levothyroxine (SYNTHROID, LEVOTHROID) 125 MCG tablet Take 125 mcg by mouth daily.       . mometasone (NASONEX) 50 MCG/ACT nasal spray Place 2 sprays into the nose daily.  17 g  2  . Multiple Vitamin (MULTIVITAMIN) tablet Take 1 tablet by mouth daily.        . naproxen sodium (ANAPROX) 220 MG tablet Take 440 mg  by mouth daily as needed. For pain      . nitroGLYCERIN (NITROSTAT) 0.4 MG SL tablet Place 0.4 mg under the tongue every 5 (five) minutes as needed. For chest pain      . Omega-3 Fatty Acids (FISH OIL) 1200 MG CAPS Take 2 capsules by mouth 2 (two) times daily.        . ranitidine (ZANTAC) 150 MG tablet Take 150 mg by mouth 2 (two) times daily.       . Tamsulosin HCl (FLOMAX) 0.4 MG CAPS Take 0.4 mg by mouth daily.        . theophylline (THEODUR) 200 MG 12 hr tablet Take 200 mg by mouth daily.       Marland Kitchen tiotropium (SPIRIVA) 18 MCG inhalation capsule Place 1 capsule (18 mcg total) into inhaler and inhale daily.  90 capsule  2  . traZODone (DESYREL) 100 MG tablet Take 100 mg by mouth at bedtime as needed. For sleep       No current facility-administered medications for this visit.    Past Medical History  Diagnosis Date  .  Hyperthyroidism 1978    RAI  . Shingles 2001  . Vitreous hemorrhage   . PVD (peripheral vascular disease)   . Other symptoms involving cardiovascular system   . Other and unspecified hyperlipidemia   . Unspecified essential hypertension   . Allergic rhinitis, cause unspecified   . Chronic airway obstruction, not elsewhere classified   . History of asbestos exposure   . Left renal mass   . Tobacco abuse   . Coronary atherosclerosis of native coronary artery     stent x 3-one drug eluting  . Depression   . Anemia   . Peripheral arterial disease   . CAD (coronary artery disease)   . Carotid artery occlusion   . Stroke   . Hypothyroidism   . Heart attack unsure, "quite a few"  . Shortness of breath   . Prostate cancer dx'd 05/2006    xrt/IMRT 2008  . Renal cancer dx'd 10/2010    cryoablation in 2012    Past Surgical History  Procedure Laterality Date  . Popliteal synovial cyst excision      knee  . Cholecystectomy    . Cervical disc surgery      fusion  . Partial knee arthroplasty      Right-replacement  . Nasal septum surgery      SMR  . Coronary  angioplasty      RCA   . Neck disk fusion  07/2002  . Eye surgery      bilat cataract  . Knee arthroplasty      bilateral  . Radioactive iodine      treatment for hyperthyroidism  . Endarterectomy  08/30/2011    Procedure: ENDARTERECTOMY CAROTID;  Surgeon: [name], MD;  Location: [facility] OR;  Service: Vascular;  Laterality: Left;  . Joint replacement  2005    Right partial Knee  . Joint replacement  2011    Left toatal Knee    History   Social History  . Marital Status: Widowed    Spouse Name: N/A    Number of Children: N/A  . Years of Education: N/A   Occupational History  . Retired      [employer]   Social History Main Topics  . Smoking status: Former Smoker -- 0.10 packs/day for 60 years    Types: Cigarettes  . Smokeless tobacco: Current User    Types: Chew     Comment: not smoked any since yesterday, trying to quit  . Alcohol Use: No  . Drug Use: No  . Sexually Active: Not on file   Other Topics Concern  . Not on file   Social History Narrative  . No narrative on file    Family History  Problem Relation Age of Onset  . Allergies    . Heart disease    . Cancer    . Coronary artery disease    . Coronary artery disease Brother   . Cancer Brother   . Heart disease Brother   . Heart attack Brother   . Anesthesia problems Neg Hx     ROS: no nausea, vomiting; no fever, chills; no melena, hematochezia; no claudication  PHYSICAL EXAM: BP 125/67  Pulse 84  Ht 5\' 7"  (1.702 m)  Wt 156 lb (70.761 kg)  BMI 24.43 kg/m2 GENERAL: 79 year old male; NAD  HEENT: NCAT, PERRLA, EOMI; sclera clear; no xanthelasma  NECK: palpable bilateral carotid pulses, soft right-sided bruit; ; left CEA scar; no JVD  LUNGS: Diminished breath sounds, mild basilar rhonchi; no  wheezes, crackles  CARDIAC: RRR (S1, S2); no significant murmurs; no rubs or gallops  ABDOMEN: soft, non-tender; intact BS  EXTREMETIES: no significant peripheral edema  SKIN: warm/dry; no obvious  rash/lesions  MUSCULOSKELETAL: no joint deformity  NEURO: no focal deficit; NL affect    EKG:    ASSESSMENT & PLAN:  CAD, NATIVE VESSEL No further cardiac workup indicated. Results of the recent NL adequate GXT Cardiolite were reviewed with the patient, and his daughter. He also has not had any recurrent chest discomfort, which in retrospect appeared to be atypical. I had documented that he did not experience any exertional CP, and did not during recent stress testing. Therefore, he is cleared to proceed with recommended R TKR, with Dr. Ollen Gross, with date not yet scheduled. He is deemed to be at an acceptable risk from a cardiac standpoint.  HYPERTENSION, UNSPECIFIED Well-controlled on current medication regimen  TOBACCO USER Although he was able to stop for 3-4 days, he has since resumed smoking. Therefore, I once again explained the risks of continued tobacco smoking, and repeated my recommendation that he stop altogether. However, I also applauded him for having been able to stop for a short period of time, indicating that he may, in fact, eventually be able to do so on his own.  Carotid stenosis, bilateral Followed regularly by Dr. Launa Grill On statin therapy, followed by primary M.D.    Gene Raenah Murley, PAC

## 2012-11-05 DIAGNOSIS — R300 Dysuria: Secondary | ICD-10-CM | POA: Diagnosis not present

## 2012-11-05 DIAGNOSIS — M549 Dorsalgia, unspecified: Secondary | ICD-10-CM | POA: Diagnosis not present

## 2012-11-05 DIAGNOSIS — R351 Nocturia: Secondary | ICD-10-CM | POA: Diagnosis not present

## 2012-11-08 ENCOUNTER — Telehealth: Payer: Self-pay | Admitting: Physician Assistant

## 2012-11-08 NOTE — Telephone Encounter (Signed)
Rehab center in [place] is asking for an order for Michael [name] to have his maintenance program with them. Told patient to come to [place] and get the order  [phone]

## 2012-11-11 NOTE — Telephone Encounter (Signed)
Left message to return call 

## 2012-11-11 NOTE — Telephone Encounter (Signed)
Patient is questioning if he could go to the [facility] [place] [phone]) for a cardiac maintenance program.   Left message to return call with [facility] rehab at [facility].

## 2012-11-12 DIAGNOSIS — R109 Unspecified abdominal pain: Secondary | ICD-10-CM | POA: Diagnosis not present

## 2012-11-14 NOTE — Telephone Encounter (Signed)
Informed patient that I spoke to the rehab center and they do not do cardiac rehab there.  If you have already been a pt there for physical therapy, then you could come back to use their facilities for a fee.  Advised pt that since [facility] is [facility].  Patient verbalized understanding.

## 2012-11-20 DIAGNOSIS — H04129 Dry eye syndrome of unspecified lacrimal gland: Secondary | ICD-10-CM | POA: Diagnosis not present

## 2012-12-09 DIAGNOSIS — C649 Malignant neoplasm of unspecified kidney, except renal pelvis: Secondary | ICD-10-CM | POA: Diagnosis not present

## 2012-12-12 ENCOUNTER — Encounter: Payer: Self-pay | Admitting: Vascular Surgery

## 2012-12-13 ENCOUNTER — Ambulatory Visit (HOSPITAL_COMMUNITY)
Admission: RE | Admit: 2012-12-13 | Discharge: 2012-12-13 | Disposition: A | Discharge status: Home / Self Care | Payer: Medicare Other | Source: Ambulatory Visit | Attending: Interventional Radiology | Admitting: Interventional Radiology

## 2012-12-13 ENCOUNTER — Other Ambulatory Visit: Payer: Medicare Other

## 2012-12-13 ENCOUNTER — Other Ambulatory Visit (INDEPENDENT_AMBULATORY_CARE_PROVIDER_SITE_OTHER): Payer: Medicare Other | Admitting: *Deleted

## 2012-12-13 ENCOUNTER — Encounter: Payer: Self-pay | Admitting: Vascular Surgery

## 2012-12-13 ENCOUNTER — Ambulatory Visit: Payer: Medicare Other | Admitting: Neurosurgery

## 2012-12-13 ENCOUNTER — Ambulatory Visit: Payer: Medicare Other | Admitting: Vascular Surgery

## 2012-12-13 ENCOUNTER — Ambulatory Visit (INDEPENDENT_AMBULATORY_CARE_PROVIDER_SITE_OTHER): Payer: Medicare Other | Admitting: Vascular Surgery

## 2012-12-13 ENCOUNTER — Encounter (HOSPITAL_COMMUNITY): Payer: Self-pay

## 2012-12-13 DIAGNOSIS — I700 Atherosclerosis of aorta: Secondary | ICD-10-CM | POA: Insufficient documentation

## 2012-12-13 DIAGNOSIS — Z8546 Personal history of malignant neoplasm of prostate: Secondary | ICD-10-CM | POA: Diagnosis not present

## 2012-12-13 DIAGNOSIS — I6529 Occlusion and stenosis of unspecified carotid artery: Secondary | ICD-10-CM

## 2012-12-13 DIAGNOSIS — N289 Disorder of kidney and ureter, unspecified: Secondary | ICD-10-CM | POA: Insufficient documentation

## 2012-12-13 DIAGNOSIS — N281 Cyst of kidney, acquired: Secondary | ICD-10-CM | POA: Insufficient documentation

## 2012-12-13 DIAGNOSIS — N2889 Other specified disorders of kidney and ureter: Secondary | ICD-10-CM

## 2012-12-13 DIAGNOSIS — Z09 Encounter for follow-up examination after completed treatment for conditions other than malignant neoplasm: Secondary | ICD-10-CM | POA: Diagnosis not present

## 2012-12-13 DIAGNOSIS — K7689 Other specified diseases of liver: Secondary | ICD-10-CM | POA: Diagnosis not present

## 2012-12-13 DIAGNOSIS — R1031 Right lower quadrant pain: Secondary | ICD-10-CM | POA: Insufficient documentation

## 2012-12-13 DIAGNOSIS — Z48812 Encounter for surgical aftercare following surgery on the circulatory system: Secondary | ICD-10-CM | POA: Diagnosis not present

## 2012-12-13 MED ORDER — CLOPIDOGREL BISULFATE 75 MG PO TABS: 75.0000 mg | ORAL_TABLET | Freq: Every day | ORAL | Status: DC

## 2012-12-13 MED ORDER — IOHEXOL 300 MG/ML  SOLN
100.0000 mL | Freq: Once | INTRAMUSCULAR | Status: AC | PRN
  Administered 2012-12-13: 100 mL via INTRAVENOUS

## 2012-12-13 NOTE — Progress Notes (Signed)
[facility] [place]  Established Carotid Patient  History of Present Illness  Michael [name] is a 79 y.o. (08/07/1931) male who presents with chief complaint: routine follow-up.  Previous carotid duplex have been limited by calcification.  Previous CTA demonstrated: RICA 70-75% stenosis, LICA 75-80% stenosis.  Patient has no history of TIA or stroke symptom.  The patient has never had amaurosis fugax or monocular blindness.  The patient has never had facial drooping or hemiplegia.  The patient has never had receptive or expressive aphasia.  The patient notes he has some evidence of possible prostate cancer recurrence with renal involvement.  He is going to get his imaging study today.  Past Medical History  Diagnosis Date  . Hyperthyroidism 1978    RAI  . Shingles 2001  . Vitreous hemorrhage   . PVD (peripheral vascular disease)   . Other symptoms involving cardiovascular system   . Other and unspecified hyperlipidemia   . Unspecified essential hypertension   . Allergic rhinitis, cause unspecified   . Chronic airway obstruction, not elsewhere classified   . History of asbestos exposure   . Left renal mass   . Tobacco abuse   . Coronary atherosclerosis of native coronary artery     stent x 3-one drug eluting  . Depression   . Anemia   . Peripheral arterial disease   . CAD (coronary artery disease)   . Carotid artery occlusion   . Stroke   . Hypothyroidism   . Heart attack unsure, "quite a few"  . Shortness of breath   . Prostate cancer dx'd 05/2006    xrt/IMRT 2008  . Renal cancer dx'd 10/2010    cryoablation in 2012    Past Surgical History  Procedure Laterality Date  . Popliteal synovial cyst excision      knee  . Cholecystectomy    . Cervical disc surgery      fusion  . Partial knee arthroplasty      Right-replacement  . Nasal septum surgery      SMR  . Coronary angioplasty      RCA   . Neck disk fusion  07/2002  . Eye surgery      bilat  cataract  . Knee arthroplasty      bilateral  . Radioactive iodine      treatment for hyperthyroidism  . Endarterectomy  08/30/2011    Procedure: ENDARTERECTOMY CAROTID;  Surgeon: Jaelynn Pozo L Orlandus Borowski, MD;  Location: [facility] OR;  Service: Vascular;  Laterality: Left;  . Joint replacement  2005    Right partial Knee  . Joint replacement  2011    Left toatal Knee    History   Social History  . Marital Status: Widowed    Spouse Name: N/A    Number of Children: N/A  . Years of Education: N/A   Occupational History  . Retired      [employer]   Social History Main Topics  . Smoking status: Former Smoker -- 0.10 packs/day for 60 years    Types: Cigarettes  . Smokeless tobacco: Current User    Types: Chew     Comment: not smoked any since yesterday, trying to quit  . Alcohol Use: No  . Drug Use: No  . Sexual Activity: Not on file   Other Topics Concern  . Not on file   Social History Narrative  . No narrative on file    Family History  Problem Relation Age of Onset  . Allergies    .   Heart disease    . Cancer    . Coronary artery disease    . Coronary artery disease Brother   . Cancer Brother   . Heart disease Brother   . Heart attack Brother   . Anesthesia problems Neg Hx    Current Outpatient Prescriptions on File Prior to Visit  Medication Sig Dispense Refill  . albuterol (PROAIR HFA) 108 (90 BASE) MCG/ACT inhaler Inhale 2 puffs into the lungs every 6 (six) hours as needed. For shortness of breath      . amLODipine (NORVASC) 10 MG tablet Take 1 tablet (10 mg total) by mouth daily.  30 tablet  0  . aspirin 81 MG tablet Take 81 mg by mouth daily.        . atorvastatin (LIPITOR) 40 MG tablet Take 40 mg by mouth daily.        . cetirizine (ZYRTEC) 10 MG tablet Take 10 mg by mouth daily.       . citalopram (CELEXA) 20 MG tablet Take 20 mg by mouth daily.       . ferrous sulfate 325 (65 FE) MG tablet Take 325 mg by mouth at bedtime.       . Fluticasone-Salmeterol  (ADVAIR DISKUS) 250-50 MCG/DOSE AEPB Inhale 1 puff into the lungs every 12 (twelve) hours.        . hydrochlorothiazide 25 MG tablet Take 12.5 mg by mouth daily.       . isosorbide mononitrate (IMDUR) 60 MG 24 hr tablet Take 1.5 tablets (90 mg total) by mouth daily.  45 tablet  3  . levothyroxine (SYNTHROID, LEVOTHROID) 125 MCG tablet Take 125 mcg by mouth daily.       . mometasone (NASONEX) 50 MCG/ACT nasal spray Place 2 sprays into the nose daily.  17 g  2  . Multiple Vitamin (MULTIVITAMIN) tablet Take 1 tablet by mouth daily.        . naproxen sodium (ANAPROX) 220 MG tablet Take 440 mg by mouth daily as needed. For pain      . nitroGLYCERIN (NITROSTAT) 0.4 MG SL tablet Place 0.4 mg under the tongue every 5 (five) minutes as needed. For chest pain      . Omega-3 Fatty Acids (FISH OIL) 1200 MG CAPS Take 2 capsules by mouth 2 (two) times daily.        . ranitidine (ZANTAC) 150 MG tablet Take 150 mg by mouth 2 (two) times daily.       . Tamsulosin HCl (FLOMAX) 0.4 MG CAPS Take 0.4 mg by mouth daily.        . theophylline (THEODUR) 200 MG 12 hr tablet Take 200 mg by mouth daily.       . tiotropium (SPIRIVA) 18 MCG inhalation capsule Place 1 capsule (18 mcg total) into inhaler and inhale daily.  90 capsule  2  . traZODone (DESYREL) 100 MG tablet Take 100 mg by mouth at bedtime as needed. For sleep       No current facility-administered medications on file prior to visit.    Allergies  Allergen Reactions  . Ciprofloxacin     REACTION: nausea  . Hydrocodone-Acetaminophen   . Iron Itching  . Lyrica [Pregabalin] Other (See Comments)  . Morphine And Related   . Oxycodone     hives  . Penicillins     REACTION: hives    REVIEW OF SYSTEMS:  (Positives checked otherwise negative)  CARDIOVASCULAR:  [] chest pain, [] chest pressure, [] palpitations, [] shortness   of breath when laying flat, [] shortness of breath with exertion,  [] pain in feet when walking, [] pain in feet when laying flat, []  history of blood clot in veins (DVT), [] history of phlebitis, [] swelling in legs, [] varicose veins  PULMONARY:  [] productive cough, [] asthma, [] wheezing  NEUROLOGIC:  [] weakness in arms or legs, [] numbness in arms or legs, [] difficulty speaking or slurred speech, [] temporary loss of vision in one eye, [] dizziness  HEMATOLOGIC:  [] bleeding problems, [] problems with blood clotting too easily  MUSCULOSKEL:  [] joint pain, [] joint swelling  GASTROINTEST:  [] vomiting blood, [] blood in stool     GENITOURINARY:  [] burning with urination, [] blood in urine  PSYCHIATRIC:  [] history of major depression  INTEGUMENTARY:  [] rashes, [] ulcers  For VQI Use Only  PRE-ADM LIVING: Home  AMB STATUS: Ambulatory  CAD Sx: Stable Angina  PRIOR CHF: None  STRESS TEST: [ ] No, [x] Normal, [ ] + ischemia, [ ] + MI, [ ] Both  Physical Examination  Filed Vitals:   12/13/12 1042 12/13/12 1048  BP: 122/63 126/64  Pulse: 57   Height: 5' 7" (1.702 m)   Weight: 153 lb 11.2 oz (69.718 kg)   SpO2: 98%    Body mass index is 24.07 kg/(m^2).  General: A&O x 3, WD, thin  Eyes: PERRLA, EOMI  Neck: Supple, no nuchal rigidity, no palpable LAD  Pulmonary: Sym exp, good air movt, CTAB, no rales, rhonchi, & wheezing  Cardiac: RRR, Nl S1, S2, no Murmurs, rubs or gallops  Vascular: Vessel Right Left  Radial Palpable Palpable  Brachial Palpable Palpable  Carotid Palpable, without bruit Palpable, without bruit  Aorta Not palpable N/A  Femoral Palpable Palpable  Popliteal Not palpable Not palpable  PT Palpable Palpable  DP Palpable Palpable   Gastrointestinal: soft, NTND, -G/R, - HSM, - masses, - CVAT B  Musculoskeletal: M/S 5/5 throughout , Extremities without ischemic changes   Neurologic: CN 2-12 intact , Pain and light touch intact in extremities , Motor exam as listed above  Non-Invasive Vascular Imaging  CAROTID DUPLEX (Date: 12/13/2012 ):   R ICA stenosis: 40-59% but  heavily calcified (351/46 prev 251/49 c/s)  R VA:  patent and antegrade  L ICA stenosis: patent CEA  L VA: patent and antegrade  Medical Decision Making  Jacoby L Berkery is a 79 y.o. male who presents with: asx R ICA stenosis likely >80%, s/p successful L CEA   There is a nearly 100 c/s increase in velocities in RICA, suggesting increase in stenosis in that artery.  As it was already 70-75%, it likely is >80% at this point.    I offer the patient a repeat CTA neck to confirm this, but he declined but elected to proceed with R CEA.  Based on the patient's vascular studies and examination, I have offered the patient: R CEA.  The pt would like to finish his prostate cancer surveillance with his CT today prior to proceeding with the R CEA.   After the findings are available, he will schedule the R CEA.  I discussed in depth with the patient the nature of atherosclerosis, and emphasized the importance of maximal medical management including strict control of blood pressure, blood glucose, and lipid levels, antiplatelet agents, obtaining regular exercise, and cessation of smoking.    The patient is aware that without maximal medical management the underlying atherosclerotic disease   process will progress, limiting the benefit of any interventions. The patient is currently on a statin: Lipitor.    The patient is currently on an anti-platelet: ASA.  I recommended starting Plavix also while waiting on the R CEA.    If he is scheduled for a renal biopsy based on his CT, I would not start the Plavix due to bleeding concerns with the bx.  Otherwise he will start 75 mg PO daily.  Thank you for allowing us to participate in this patient's care.  Dalylah Ramey, MD [facility] [place] Office: [phone] Pager: [phone]  12/13/2012, 12:09 PM   ---  VASCULAR QUALITY INITIATIVE FOLLOW UP DATA:  Current smoker: [x  ] yes  [  ] no  Living status: [ x ]  Home  [  ]  Nursing home  [  ] Homeless    MEDS:  ASA [x  ] yes  [  ] no- [  ] medical reason  [  ] non compliant  STATIN  [ x ] yes  [  ] no- [  ] medical reason  [  ] non compliant  Beta blocker [  ] yes  [ x ] no- [  ] medical reason  [  ] non compliant  ACE inhibitor [  ] yes  [ x ] no- [  ] medical reason  [  ] non compliant  P2Y12 Antagonist [ x ] none  [  ] clopidogrel-Plavix  [  ] ticlopidine-Ticlid   [  ] prasugrel-Effient  [  ] ticagrelor- Brilinta    Anticoagulant [ x ] None  [  ] warfarin  [  ] rivaroxaban-Xarelto [  ] dabigatran- Pradaxa  Neurologic event since D/C:  [ x ] no  [  ] yes: [  ] eye event  [  ] cortical event  [  ] VB event  [  ] non specific event  [  ] right  [  ] left  [  ] TIA  [  ] stroke  Date:   Modified Rankin Score: 0  MI since D/C: [ x ] no  [  ] troponin only  [  ] EKG or clinical  Cranial nerve injury: [ x  ] none  [  ] resolved  [  ] persistent  Duplex CEA site: [  ] no  [ x ] yes - PSV= 148  EDV= 32  ICA/CCA ratio: 1.55  Stenosis= [ x ] <40% [  ] 40-59% [  ] 60-79%  [  ] > 80%  [  ]  Occluded  CEA site re-operation:  [x  ] no   [  ] yes- date of re-op:  CEA site PCI:   [ x ] no   [  ] yes- date of PCI:     

## 2012-12-16 ENCOUNTER — Other Ambulatory Visit: Payer: Self-pay

## 2012-12-16 NOTE — Pre-Procedure Instructions (Addendum)
Michael [name]  12/16/2012   Your procedure is scheduled on:  Wednesday, August 20th.  Report to [facility] at 6:30AM.  Call this number if you have problems the morning of surgery: [phone]   Remember:   Do not eat food or drink liquids after midnight.   Take these medicines the morning of surgery with A SIP OF WATER: Amlodipine (Norvasc), Cetirizine (Zyrtec), Citalopram (Celexa), Isosorbide Mononitrate (Imdur), Levothyroxine (Synthyroid), Tamsulosin (Flomax) Theophylline (Theodur).   May use nasal spray and Inhalers.  Bring Albuterol to the hospital with you.    Take if needed: Nitroglycerine.   Do not wear jewelry, make-up or nail pith you.olish.  Do not wear lotions, powders, or perfumes.  Men may shave face and neck.  Do not bring valuables to the hospital.  [facility] is not responsible for any belongings or valuables.  Contacts, dentures or bridgework may not be worn into surgery.  Leave suitcase in the car. After surgery it may be brought to your room.  For patients admitted to the hospital, checkout time is 11:00 AM the day of discharge.     Special Instructions: Shower with CHG wash (Bactoshield) tonight and again in the am prior to arriving to hospital.   Please read over the following fact sheets that you were given: Pain Booklet, Coughing and Deep Breathing, Blood Transfusion Information and Surgical Site Infection Prevention

## 2012-12-17 ENCOUNTER — Encounter (HOSPITAL_COMMUNITY): Payer: Self-pay

## 2012-12-17 ENCOUNTER — Encounter (HOSPITAL_COMMUNITY)
Admission: RE | Admit: 2012-12-17 | Discharge: 2012-12-17 | Disposition: A | Discharge status: Home / Self Care | Payer: Medicare Other | Source: Ambulatory Visit | Attending: Vascular Surgery | Admitting: Vascular Surgery

## 2012-12-17 DIAGNOSIS — Z96659 Presence of unspecified artificial knee joint: Secondary | ICD-10-CM | POA: Diagnosis not present

## 2012-12-17 DIAGNOSIS — Z881 Allergy status to other antibiotic agents status: Secondary | ICD-10-CM | POA: Diagnosis not present

## 2012-12-17 DIAGNOSIS — Z880 Allergy status to penicillin: Secondary | ICD-10-CM | POA: Diagnosis not present

## 2012-12-17 DIAGNOSIS — E785 Hyperlipidemia, unspecified: Secondary | ICD-10-CM | POA: Diagnosis not present

## 2012-12-17 DIAGNOSIS — F329 Major depressive disorder, single episode, unspecified: Secondary | ICD-10-CM | POA: Diagnosis present

## 2012-12-17 DIAGNOSIS — Z85528 Personal history of other malignant neoplasm of kidney: Secondary | ICD-10-CM | POA: Diagnosis not present

## 2012-12-17 DIAGNOSIS — I6529 Occlusion and stenosis of unspecified carotid artery: Secondary | ICD-10-CM | POA: Diagnosis not present

## 2012-12-17 DIAGNOSIS — I10 Essential (primary) hypertension: Secondary | ICD-10-CM | POA: Diagnosis not present

## 2012-12-17 DIAGNOSIS — Z9089 Acquired absence of other organs: Secondary | ICD-10-CM | POA: Diagnosis not present

## 2012-12-17 DIAGNOSIS — Z8546 Personal history of malignant neoplasm of prostate: Secondary | ICD-10-CM | POA: Diagnosis not present

## 2012-12-17 DIAGNOSIS — Z8249 Family history of ischemic heart disease and other diseases of the circulatory system: Secondary | ICD-10-CM | POA: Diagnosis not present

## 2012-12-17 DIAGNOSIS — F17200 Nicotine dependence, unspecified, uncomplicated: Secondary | ICD-10-CM | POA: Diagnosis present

## 2012-12-17 DIAGNOSIS — J449 Chronic obstructive pulmonary disease, unspecified: Secondary | ICD-10-CM | POA: Diagnosis not present

## 2012-12-17 DIAGNOSIS — Z7982 Long term (current) use of aspirin: Secondary | ICD-10-CM | POA: Diagnosis not present

## 2012-12-17 DIAGNOSIS — I9589 Other hypotension: Secondary | ICD-10-CM | POA: Diagnosis not present

## 2012-12-17 DIAGNOSIS — I2510 Atherosclerotic heart disease of native coronary artery without angina pectoris: Secondary | ICD-10-CM | POA: Diagnosis present

## 2012-12-17 DIAGNOSIS — Z923 Personal history of irradiation: Secondary | ICD-10-CM | POA: Diagnosis not present

## 2012-12-17 DIAGNOSIS — Z888 Allergy status to other drugs, medicaments and biological substances status: Secondary | ICD-10-CM | POA: Diagnosis not present

## 2012-12-17 DIAGNOSIS — Z77090 Contact with and (suspected) exposure to asbestos: Secondary | ICD-10-CM | POA: Diagnosis not present

## 2012-12-17 DIAGNOSIS — J4489 Other specified chronic obstructive pulmonary disease: Secondary | ICD-10-CM | POA: Diagnosis not present

## 2012-12-17 DIAGNOSIS — Z79899 Other long term (current) drug therapy: Secondary | ICD-10-CM | POA: Diagnosis not present

## 2012-12-17 DIAGNOSIS — H9190 Unspecified hearing loss, unspecified ear: Secondary | ICD-10-CM | POA: Diagnosis present

## 2012-12-17 DIAGNOSIS — Z981 Arthrodesis status: Secondary | ICD-10-CM | POA: Diagnosis not present

## 2012-12-17 DIAGNOSIS — I739 Peripheral vascular disease, unspecified: Secondary | ICD-10-CM | POA: Diagnosis present

## 2012-12-17 DIAGNOSIS — K219 Gastro-esophageal reflux disease without esophagitis: Secondary | ICD-10-CM | POA: Diagnosis present

## 2012-12-17 HISTORY — DX: Gastro-esophageal reflux disease without esophagitis: K21.9

## 2012-12-17 HISTORY — DX: Unspecified hemorrhoids: K64.9

## 2012-12-17 HISTORY — DX: Unspecified hearing loss, unspecified ear: H91.90

## 2012-12-17 HISTORY — DX: Unspecified osteoarthritis, unspecified site: M19.90

## 2012-12-17 LAB — CBC
Hemoglobin: 13.7 g/dL (ref 13.0–17.0)
Platelets: 255 10*3/uL (ref 150–400)
RBC: 4.22 MIL/uL (ref 4.22–5.81)
WBC: 10.1 10*3/uL (ref 4.0–10.5)

## 2012-12-17 LAB — COMPREHENSIVE METABOLIC PANEL
ALT: 11 U/L (ref 0–53)
AST: 19 U/L (ref 0–37)
Albumin: 3.4 g/dL — ABNORMAL LOW (ref 3.5–5.2)
CO2: 28 mEq/L (ref 19–32)
Calcium: 10 mg/dL (ref 8.4–10.5)
Creatinine, Ser: 1.14 mg/dL (ref 0.50–1.35)
GFR calc non Af Amer: 59 mL/min — ABNORMAL LOW (ref >90)
Sodium: 134 mEq/L — ABNORMAL LOW (ref 135–145)
Total Protein: 6.3 g/dL (ref 6.0–8.3)

## 2012-12-17 LAB — URINALYSIS, ROUTINE W REFLEX MICROSCOPIC
Glucose, UA: NEGATIVE mg/dL
Ketones, ur: NEGATIVE mg/dL
Leukocytes, UA: NEGATIVE
Protein, ur: NEGATIVE mg/dL
Urobilinogen, UA: 0.2 mg/dL (ref 0.0–1.0)

## 2012-12-17 LAB — APTT: aPTT: 34 seconds (ref 24–37)

## 2012-12-17 LAB — SURGICAL PCR SCREEN: Staphylococcus aureus: NEGATIVE

## 2012-12-17 LAB — TYPE AND SCREEN
ABO/RH(D): O POS
Antibody Screen: NEGATIVE

## 2012-12-17 LAB — PROTIME-INR: INR: 0.95 (ref 0.00–1.49)

## 2012-12-17 MED ORDER — SODIUM CHLORIDE 0.9 % IV SOLN
INTRAVENOUS | Status: DC
  Administered 2012-12-18: 500 mL via INTRAVENOUS

## 2012-12-17 MED ORDER — VANCOMYCIN HCL IN DEXTROSE 1-5 GM/200ML-% IV SOLN
1000.0000 mg | INTRAVENOUS | Status: AC
  Administered 2012-12-18: 1000 mg via INTRAVENOUS
  Filled 2012-12-17 (×2): qty 200

## 2012-12-17 NOTE — Progress Notes (Signed)
Pt was seen  In July for cardiac clearance for orthopedic surgery, which has not taken place.Charline Bills notified Allison Z.of clearance , no new orders.

## 2012-12-18 ENCOUNTER — Encounter (HOSPITAL_COMMUNITY): Payer: Self-pay | Admitting: Vascular Surgery

## 2012-12-18 ENCOUNTER — Encounter (HOSPITAL_COMMUNITY)
Admission: RE | Disposition: A | Discharge status: Home / Self Care | Payer: Self-pay | Source: Ambulatory Visit | Attending: Vascular Surgery

## 2012-12-18 ENCOUNTER — Inpatient Hospital Stay (HOSPITAL_COMMUNITY): Payer: Medicare Other | Admitting: Anesthesiology

## 2012-12-18 ENCOUNTER — Encounter (HOSPITAL_COMMUNITY): Payer: Self-pay | Admitting: Certified Registered"

## 2012-12-18 ENCOUNTER — Telehealth: Payer: Self-pay | Admitting: Vascular Surgery

## 2012-12-18 ENCOUNTER — Inpatient Hospital Stay (HOSPITAL_COMMUNITY)
Admission: RE | Admit: 2012-12-18 | Discharge: 2012-12-19 | DRG: 039 | Disposition: A | Discharge status: Home / Self Care | Payer: Medicare Other | Source: Ambulatory Visit | Attending: Vascular Surgery | Admitting: Vascular Surgery

## 2012-12-18 DIAGNOSIS — J449 Chronic obstructive pulmonary disease, unspecified: Secondary | ICD-10-CM | POA: Diagnosis present

## 2012-12-18 DIAGNOSIS — I6529 Occlusion and stenosis of unspecified carotid artery: Principal | ICD-10-CM | POA: Diagnosis present

## 2012-12-18 DIAGNOSIS — Z881 Allergy status to other antibiotic agents status: Secondary | ICD-10-CM

## 2012-12-18 DIAGNOSIS — Z8249 Family history of ischemic heart disease and other diseases of the circulatory system: Secondary | ICD-10-CM

## 2012-12-18 DIAGNOSIS — Z8546 Personal history of malignant neoplasm of prostate: Secondary | ICD-10-CM

## 2012-12-18 DIAGNOSIS — F3289 Other specified depressive episodes: Secondary | ICD-10-CM | POA: Diagnosis present

## 2012-12-18 DIAGNOSIS — Z981 Arthrodesis status: Secondary | ICD-10-CM

## 2012-12-18 DIAGNOSIS — Z7982 Long term (current) use of aspirin: Secondary | ICD-10-CM

## 2012-12-18 DIAGNOSIS — Z923 Personal history of irradiation: Secondary | ICD-10-CM

## 2012-12-18 DIAGNOSIS — Z9089 Acquired absence of other organs: Secondary | ICD-10-CM

## 2012-12-18 DIAGNOSIS — J4489 Other specified chronic obstructive pulmonary disease: Secondary | ICD-10-CM | POA: Diagnosis present

## 2012-12-18 DIAGNOSIS — E785 Hyperlipidemia, unspecified: Secondary | ICD-10-CM | POA: Diagnosis present

## 2012-12-18 DIAGNOSIS — I6523 Occlusion and stenosis of bilateral carotid arteries: Secondary | ICD-10-CM

## 2012-12-18 DIAGNOSIS — I10 Essential (primary) hypertension: Secondary | ICD-10-CM | POA: Diagnosis present

## 2012-12-18 DIAGNOSIS — Z79899 Other long term (current) drug therapy: Secondary | ICD-10-CM

## 2012-12-18 DIAGNOSIS — Y849 Medical procedure, unspecified as the cause of abnormal reaction of the patient, or of later complication, without mention of misadventure at the time of the procedure: Secondary | ICD-10-CM | POA: Diagnosis not present

## 2012-12-18 DIAGNOSIS — K219 Gastro-esophageal reflux disease without esophagitis: Secondary | ICD-10-CM | POA: Diagnosis present

## 2012-12-18 DIAGNOSIS — I2510 Atherosclerotic heart disease of native coronary artery without angina pectoris: Secondary | ICD-10-CM | POA: Diagnosis present

## 2012-12-18 DIAGNOSIS — Z96659 Presence of unspecified artificial knee joint: Secondary | ICD-10-CM

## 2012-12-18 DIAGNOSIS — Z888 Allergy status to other drugs, medicaments and biological substances status: Secondary | ICD-10-CM

## 2012-12-18 DIAGNOSIS — Y9210 Unspecified residential institution as the place of occurrence of the external cause: Secondary | ICD-10-CM | POA: Diagnosis not present

## 2012-12-18 DIAGNOSIS — F329 Major depressive disorder, single episode, unspecified: Secondary | ICD-10-CM | POA: Diagnosis present

## 2012-12-18 DIAGNOSIS — Z880 Allergy status to penicillin: Secondary | ICD-10-CM

## 2012-12-18 DIAGNOSIS — H9190 Unspecified hearing loss, unspecified ear: Secondary | ICD-10-CM | POA: Diagnosis present

## 2012-12-18 DIAGNOSIS — Z77090 Contact with and (suspected) exposure to asbestos: Secondary | ICD-10-CM

## 2012-12-18 DIAGNOSIS — I9589 Other hypotension: Secondary | ICD-10-CM | POA: Diagnosis not present

## 2012-12-18 DIAGNOSIS — Z85528 Personal history of other malignant neoplasm of kidney: Secondary | ICD-10-CM

## 2012-12-18 DIAGNOSIS — I739 Peripheral vascular disease, unspecified: Secondary | ICD-10-CM | POA: Diagnosis present

## 2012-12-18 DIAGNOSIS — F17200 Nicotine dependence, unspecified, uncomplicated: Secondary | ICD-10-CM | POA: Diagnosis present

## 2012-12-18 HISTORY — PX: ENDARTERECTOMY: SHX5162

## 2012-12-18 LAB — BASIC METABOLIC PANEL
Calcium: 6 mg/dL — CL (ref 8.4–10.5)
GFR calc non Af Amer: 83 mL/min — ABNORMAL LOW (ref >90)
Glucose, Bld: 83 mg/dL (ref 70–99)
Potassium: 2.9 mEq/L — ABNORMAL LOW (ref 3.5–5.1)
Sodium: 139 mEq/L (ref 135–145)

## 2012-12-18 LAB — MAGNESIUM: Magnesium: 1.3 mg/dL — ABNORMAL LOW (ref 1.5–2.5)

## 2012-12-18 SURGERY — ENDARTERECTOMY, CAROTID
Anesthesia: General | Site: Neck | Laterality: Right | Wound class: Clean

## 2012-12-18 MED ORDER — 0.9 % SODIUM CHLORIDE (POUR BTL) OPTIME
TOPICAL | Status: DC | PRN
  Administered 2012-12-18: 2000 mL

## 2012-12-18 MED ORDER — ACETAMINOPHEN 650 MG RE SUPP: 325.0000 mg | RECTAL | Status: DC | PRN

## 2012-12-18 MED ORDER — MOMETASONE FURO-FORMOTEROL FUM 100-5 MCG/ACT IN AERO
2.0000 | INHALATION_SPRAY | Freq: Two times a day (BID) | RESPIRATORY_TRACT | Status: DC
  Administered 2012-12-18 – 2012-12-19 (×2): 2 via RESPIRATORY_TRACT
  Filled 2012-12-18: qty 8.8

## 2012-12-18 MED ORDER — LIDOCAINE HCL (CARDIAC) 20 MG/ML IV SOLN
INTRAVENOUS | Status: DC | PRN
  Administered 2012-12-18: 80 mg via INTRAVENOUS

## 2012-12-18 MED ORDER — THROMBIN 20000 UNITS EX SOLR
CUTANEOUS | Status: AC
  Filled 2012-12-18: qty 20000

## 2012-12-18 MED ORDER — TRAMADOL HCL 50 MG PO TABS: 50.0000 mg | ORAL_TABLET | Freq: Four times a day (QID) | ORAL | Status: DC | PRN

## 2012-12-18 MED ORDER — DEXTRAN 40 IN SALINE 10-0.9 % IV SOLN
INTRAVENOUS | Status: DC | PRN
  Administered 2012-12-18: 500 mL

## 2012-12-18 MED ORDER — AMLODIPINE BESYLATE 10 MG PO TABS
10.0000 mg | ORAL_TABLET | Freq: Every day | ORAL | Status: DC
Start: 2012-12-19 — End: 2012-12-19
  Administered 2012-12-19: 10 mg via ORAL
  Filled 2012-12-18: qty 1

## 2012-12-18 MED ORDER — THROMBIN 20000 UNITS EX SOLR
CUTANEOUS | Status: DC | PRN
  Administered 2012-12-18: 10:00:00 via TOPICAL

## 2012-12-18 MED ORDER — ROCURONIUM BROMIDE 100 MG/10ML IV SOLN
INTRAVENOUS | Status: DC | PRN
  Administered 2012-12-18: 50 mg via INTRAVENOUS

## 2012-12-18 MED ORDER — SODIUM CHLORIDE 0.9 % IV SOLN
INTRAVENOUS | Status: DC
  Administered 2012-12-18: 100 mL/h via INTRAVENOUS
  Administered 2012-12-19: 01:00:00 via INTRAVENOUS

## 2012-12-18 MED ORDER — TAMSULOSIN HCL 0.4 MG PO CAPS
0.4000 mg | ORAL_CAPSULE | Freq: Every day | ORAL | Status: DC
  Administered 2012-12-19: 0.4 mg via ORAL
  Filled 2012-12-18: qty 1

## 2012-12-18 MED ORDER — GLYCOPYRROLATE 0.2 MG/ML IJ SOLN
INTRAMUSCULAR | Status: DC | PRN
  Administered 2012-12-18: 0.2 mg via INTRAVENOUS

## 2012-12-18 MED ORDER — FENTANYL CITRATE 0.05 MG/ML IJ SOLN: 25.0000 ug | INTRAMUSCULAR | Status: DC | PRN

## 2012-12-18 MED ORDER — METOPROLOL TARTRATE 1 MG/ML IV SOLN: 2.0000 mg | INTRAVENOUS | Status: DC | PRN

## 2012-12-18 MED ORDER — ONDANSETRON HCL 4 MG/2ML IJ SOLN
INTRAMUSCULAR | Status: DC | PRN
  Administered 2012-12-18: 4 mg via INTRAVENOUS

## 2012-12-18 MED ORDER — MIDAZOLAM HCL 2 MG/2ML IJ SOLN: 1.0000 mg | INTRAMUSCULAR | Status: DC | PRN

## 2012-12-18 MED ORDER — ALBUMIN HUMAN 5 % IV SOLN
INTRAVENOUS | Status: AC
  Administered 2012-12-18: 12.5 g
  Filled 2012-12-18: qty 250

## 2012-12-18 MED ORDER — EPHEDRINE SULFATE 50 MG/ML IJ SOLN
INTRAMUSCULAR | Status: DC | PRN
  Administered 2012-12-18: 15 mg via INTRAVENOUS

## 2012-12-18 MED ORDER — VANCOMYCIN HCL IN DEXTROSE 1-5 GM/200ML-% IV SOLN
1000.0000 mg | Freq: Two times a day (BID) | INTRAVENOUS | Status: AC
  Administered 2012-12-18 – 2012-12-19 (×2): 1000 mg via INTRAVENOUS
  Filled 2012-12-18 (×2): qty 200

## 2012-12-18 MED ORDER — POTASSIUM CHLORIDE 10 MEQ/100ML IV SOLN
10.0000 meq | INTRAVENOUS | Status: AC
  Administered 2012-12-18 (×3): 10 meq via INTRAVENOUS
  Filled 2012-12-18: qty 300
  Filled 2012-12-18: qty 200

## 2012-12-18 MED ORDER — DEXTRAN 40 IN SALINE 10-0.9 % IV SOLN
25.0000 mL/h | INTRAVENOUS | Status: DC
  Filled 2012-12-18: qty 500

## 2012-12-18 MED ORDER — DOCUSATE SODIUM 100 MG PO CAPS: 100.0000 mg | ORAL_CAPSULE | Freq: Every day | ORAL | Status: DC

## 2012-12-18 MED ORDER — ONDANSETRON HCL 4 MG/2ML IJ SOLN: 4.0000 mg | Freq: Four times a day (QID) | INTRAMUSCULAR | Status: DC | PRN

## 2012-12-18 MED ORDER — NITROGLYCERIN 0.4 MG SL SUBL: 0.4000 mg | SUBLINGUAL_TABLET | SUBLINGUAL | Status: DC | PRN

## 2012-12-18 MED ORDER — PROMETHAZINE HCL 25 MG/ML IJ SOLN: 6.2500 mg | INTRAMUSCULAR | Status: DC | PRN

## 2012-12-18 MED ORDER — PROPOFOL 10 MG/ML IV BOLUS
INTRAVENOUS | Status: DC | PRN
  Administered 2012-12-18: 120 mg via INTRAVENOUS

## 2012-12-18 MED ORDER — LABETALOL HCL 5 MG/ML IV SOLN: 10.0000 mg | INTRAVENOUS | Status: DC | PRN

## 2012-12-18 MED ORDER — HYDRALAZINE HCL 20 MG/ML IJ SOLN: 10.0000 mg | INTRAMUSCULAR | Status: DC | PRN

## 2012-12-18 MED ORDER — SODIUM CHLORIDE 0.9 % IR SOLN
Status: DC | PRN
  Administered 2012-12-18: 09:00:00

## 2012-12-18 MED ORDER — PHENOL 1.4 % MT LIQD: 1.0000 | OROMUCOSAL | Status: DC | PRN

## 2012-12-18 MED ORDER — HYDROCHLOROTHIAZIDE 25 MG PO TABS
12.5000 mg | ORAL_TABLET | Freq: Every day | ORAL | Status: DC
  Administered 2012-12-19: 12.5 mg via ORAL
  Filled 2012-12-18: qty 0.5

## 2012-12-18 MED ORDER — CITALOPRAM HYDROBROMIDE 20 MG PO TABS
20.0000 mg | ORAL_TABLET | Freq: Every day | ORAL | Status: DC
Start: 2012-12-19 — End: 2012-12-19
  Administered 2012-12-19: 20 mg via ORAL
  Filled 2012-12-18: qty 1

## 2012-12-18 MED ORDER — FENTANYL CITRATE 0.05 MG/ML IJ SOLN
INTRAMUSCULAR | Status: DC | PRN
  Administered 2012-12-18 (×2): 50 ug via INTRAVENOUS

## 2012-12-18 MED ORDER — POTASSIUM CHLORIDE CRYS ER 20 MEQ PO TBCR: 20.0000 meq | EXTENDED_RELEASE_TABLET | Freq: Once | ORAL | Status: AC | PRN

## 2012-12-18 MED ORDER — ASPIRIN EC 81 MG PO TBEC
81.0000 mg | DELAYED_RELEASE_TABLET | Freq: Every day | ORAL | Status: DC
  Administered 2012-12-19: 81 mg via ORAL
  Filled 2012-12-18: qty 1

## 2012-12-18 MED ORDER — LEVOTHYROXINE SODIUM 125 MCG PO TABS
125.0000 ug | ORAL_TABLET | Freq: Every day | ORAL | Status: DC
  Administered 2012-12-19: 125 ug via ORAL
  Filled 2012-12-18 (×2): qty 1

## 2012-12-18 MED ORDER — THEOPHYLLINE ER 100 MG PO TB12
100.0000 mg | ORAL_TABLET | Freq: Every day | ORAL | Status: DC
Start: 2012-12-19 — End: 2012-12-19
  Administered 2012-12-19: 100 mg via ORAL
  Filled 2012-12-18: qty 1

## 2012-12-18 MED ORDER — SODIUM CHLORIDE 0.9 % IV SOLN
10.0000 mg | INTRAVENOUS | Status: DC | PRN
  Administered 2012-12-18: 10:00:00 via INTRAVENOUS
  Administered 2012-12-18: 10 ug/min via INTRAVENOUS

## 2012-12-18 MED ORDER — HYDROMORPHONE HCL PF 1 MG/ML IJ SOLN: 0.5000 mg | INTRAMUSCULAR | Status: DC | PRN

## 2012-12-18 MED ORDER — FLUTICASONE PROPIONATE 50 MCG/ACT NA SUSP
2.0000 | Freq: Every day | NASAL | Status: DC
  Administered 2012-12-19 (×2): 2 via NASAL
  Filled 2012-12-18: qty 16

## 2012-12-18 MED ORDER — ALBUTEROL SULFATE HFA 108 (90 BASE) MCG/ACT IN AERS
2.0000 | INHALATION_SPRAY | Freq: Four times a day (QID) | RESPIRATORY_TRACT | Status: DC | PRN
  Filled 2012-12-18: qty 6.7

## 2012-12-18 MED ORDER — ARTIFICIAL TEARS OP OINT
TOPICAL_OINTMENT | OPHTHALMIC | Status: DC | PRN
  Administered 2012-12-18: 1 via OPHTHALMIC

## 2012-12-18 MED ORDER — ALUM & MAG HYDROXIDE-SIMETH 200-200-20 MG/5ML PO SUSP: 15.0000 mL | ORAL | Status: DC | PRN

## 2012-12-18 MED ORDER — ACETAMINOPHEN 325 MG PO TABS: 325.0000 mg | ORAL_TABLET | ORAL | Status: DC | PRN

## 2012-12-18 MED ORDER — HEPARIN SODIUM (PORCINE) 1000 UNIT/ML IJ SOLN
INTRAMUSCULAR | Status: DC | PRN
  Administered 2012-12-18: 6000 [IU] via INTRAVENOUS

## 2012-12-18 MED ORDER — TIOTROPIUM BROMIDE MONOHYDRATE 18 MCG IN CAPS
18.0000 ug | ORAL_CAPSULE | Freq: Every day | RESPIRATORY_TRACT | Status: DC
  Filled 2012-12-18: qty 5

## 2012-12-18 MED ORDER — LIDOCAINE HCL (PF) 1 % IJ SOLN
INTRAMUSCULAR | Status: AC
  Filled 2012-12-18: qty 30

## 2012-12-18 MED ORDER — PROTAMINE SULFATE 10 MG/ML IV SOLN
INTRAVENOUS | Status: DC | PRN
  Administered 2012-12-18: 20 mg via INTRAVENOUS
  Administered 2012-12-18: 10 mg via INTRAVENOUS

## 2012-12-18 MED ORDER — FENTANYL CITRATE 0.05 MG/ML IJ SOLN: 50.0000 ug | Freq: Once | INTRAMUSCULAR | Status: DC

## 2012-12-18 MED ORDER — FAMOTIDINE 20 MG PO TABS
20.0000 mg | ORAL_TABLET | Freq: Two times a day (BID) | ORAL | Status: DC
  Administered 2012-12-18 – 2012-12-19 (×2): 20 mg via ORAL
  Filled 2012-12-18 (×3): qty 1

## 2012-12-18 MED ORDER — ISOSORBIDE MONONITRATE ER 60 MG PO TB24
90.0000 mg | ORAL_TABLET | Freq: Every day | ORAL | Status: DC
  Administered 2012-12-19: 90 mg via ORAL
  Filled 2012-12-18: qty 1

## 2012-12-18 MED ORDER — LACTATED RINGERS IV SOLN
INTRAVENOUS | Status: DC | PRN
  Administered 2012-12-18 (×2): via INTRAVENOUS

## 2012-12-18 MED ORDER — SODIUM CHLORIDE 0.9 % IV SOLN
500.0000 mL | Freq: Once | INTRAVENOUS | Status: AC | PRN
  Administered 2012-12-18: 500 mL via INTRAVENOUS

## 2012-12-18 MED ORDER — GUAIFENESIN-DM 100-10 MG/5ML PO SYRP
15.0000 mL | ORAL_SOLUTION | ORAL | Status: DC | PRN
  Administered 2012-12-18: 15 mL via ORAL
  Filled 2012-12-18: qty 5
  Filled 2012-12-18: qty 10

## 2012-12-18 MED ORDER — ATORVASTATIN CALCIUM 40 MG PO TABS
40.0000 mg | ORAL_TABLET | Freq: Every day | ORAL | Status: DC
Start: 2012-12-19 — End: 2012-12-19
  Administered 2012-12-19: 40 mg via ORAL
  Filled 2012-12-18: qty 1

## 2012-12-18 MED ORDER — NITROGLYCERIN 0.2 MG/ML ON CALL CATH LAB
INTRAVENOUS | Status: DC | PRN
  Administered 2012-12-18: 40 ug via INTRAVENOUS

## 2012-12-18 SURGICAL SUPPLY — 58 items
ADH SKN CLS APL DERMABOND .7 (GAUZE/BANDAGES/DRESSINGS) ×1
ADPR TBG 2 MALE LL ART (MISCELLANEOUS)
BAG DECANTER FOR FLEXI CONT (MISCELLANEOUS) ×2 IMPLANT
CANISTER SUCTION 2500CC (MISCELLANEOUS) ×2 IMPLANT
CATH ROBINSON RED A/P 18FR (CATHETERS) ×2 IMPLANT
CATH SUCT 10FR WHISTLE TIP (CATHETERS) ×2 IMPLANT
CLIP TI MEDIUM 24 (CLIP) ×2 IMPLANT
CLIP TI WIDE RED SMALL 24 (CLIP) ×2 IMPLANT
CLOTH BEACON ORANGE TIMEOUT ST (SAFETY) ×2 IMPLANT
COVER PROBE W GEL 5X96 (DRAPES) IMPLANT
COVER SURGICAL LIGHT HANDLE (MISCELLANEOUS) ×2 IMPLANT
CRADLE DONUT ADULT HEAD (MISCELLANEOUS) ×2 IMPLANT
DERMABOND ADVANCED (GAUZE/BANDAGES/DRESSINGS) ×1
DERMABOND ADVANCED .7 DNX12 (GAUZE/BANDAGES/DRESSINGS) ×1 IMPLANT
DRAPE WARM FLUID 44X44 (DRAPE) ×2 IMPLANT
ELECT REM PT RETURN 9FT ADLT (ELECTROSURGICAL) ×2
ELECTRODE REM PT RTRN 9FT ADLT (ELECTROSURGICAL) ×1 IMPLANT
GLOVE BIO SURGEON STRL SZ7 (GLOVE) ×2 IMPLANT
GLOVE BIO SURGEON STRL SZ7.5 (GLOVE) ×1 IMPLANT
GLOVE BIOGEL PI IND STRL 6 (GLOVE) IMPLANT
GLOVE BIOGEL PI IND STRL 6.5 (GLOVE) IMPLANT
GLOVE BIOGEL PI IND STRL 7.0 (GLOVE) IMPLANT
GLOVE BIOGEL PI IND STRL 7.5 (GLOVE) ×1 IMPLANT
GLOVE BIOGEL PI INDICATOR 6 (GLOVE) ×1
GLOVE BIOGEL PI INDICATOR 6.5 (GLOVE) ×1
GLOVE BIOGEL PI INDICATOR 7.0 (GLOVE) ×1
GLOVE BIOGEL PI INDICATOR 7.5 (GLOVE) ×2
GLOVE SS BIOGEL STRL SZ 6.5 (GLOVE) IMPLANT
GLOVE SUPERSENSE BIOGEL SZ 6.5 (GLOVE) ×1
GLOVE SURG SS PI 6.5 STRL IVOR (GLOVE) ×1 IMPLANT
GOWN STRL NON-REIN LRG LVL3 (GOWN DISPOSABLE) ×6 IMPLANT
IV ADAPTER SYR DOUBLE MALE LL (MISCELLANEOUS) IMPLANT
KIT BASIN OR (CUSTOM PROCEDURE TRAY) ×3 IMPLANT
KIT ROOM TURNOVER OR (KITS) ×2 IMPLANT
NS IRRIG 1000ML POUR BTL (IV SOLUTION) ×4 IMPLANT
PACK CAROTID (CUSTOM PROCEDURE TRAY) ×2 IMPLANT
PAD ARMBOARD 7.5X6 YLW CONV (MISCELLANEOUS) ×4 IMPLANT
PATCH VASCULAR VASCU GUARD 1X6 (Vascular Products) ×2 IMPLANT
SET COLLECT BLD 21X3/4 12 PB (MISCELLANEOUS) IMPLANT
SHUNT CAROTID BYPASS 10 (VASCULAR PRODUCTS) IMPLANT
SHUNT CAROTID BYPASS 12FRX15.5 (VASCULAR PRODUCTS) IMPLANT
SPONGE SURGIFOAM ABS GEL 100 (HEMOSTASIS) IMPLANT
STOPCOCK 4 WAY LG BORE MALE ST (IV SETS) IMPLANT
SUT ETHILON 3 0 PS 1 (SUTURE) IMPLANT
SUT MNCRL AB 4-0 PS2 18 (SUTURE) ×2 IMPLANT
SUT PROLENE 6 0 BV (SUTURE) ×2 IMPLANT
SUT PROLENE 7 0 BV 1 (SUTURE) IMPLANT
SUT SILK 3 0 TIES 17X18 (SUTURE)
SUT SILK 3-0 18XBRD TIE BLK (SUTURE) IMPLANT
SUT VIC AB 3-0 SH 27 (SUTURE) ×2
SUT VIC AB 3-0 SH 27X BRD (SUTURE) ×1 IMPLANT
SYR TB 1ML LUER SLIP (SYRINGE) IMPLANT
SYSTEM CHEST DRAIN TLS 7FR (DRAIN) IMPLANT
TOWEL OR 17X24 6PK STRL BLUE (TOWEL DISPOSABLE) ×2 IMPLANT
TOWEL OR 17X26 10 PK STRL BLUE (TOWEL DISPOSABLE) ×2 IMPLANT
TUBING ART PRESS 48 MALE/FEM (TUBING) IMPLANT
TUBING EXTENTION W/L.L. (IV SETS) IMPLANT
WATER STERILE IRR 1000ML POUR (IV SOLUTION) ×2 IMPLANT

## 2012-12-18 NOTE — Progress Notes (Signed)
EKG done

## 2012-12-18 NOTE — Op Note (Signed)
OPERATIVE NOTE  PROCEDURE:   1.  right carotid endarterectomy with bovine patch angioplasty 2.  right intraoperative carotid ultrasound  PRE-OPERATIVE DIAGNOSIS: right asymptomatic carotid stenosis >80%  POST-OPERATIVE DIAGNOSIS: same as above   SURGEON: Leonides Sake, MD  ASSISTANT(S): Della Goo, [facility]   ANESTHESIA: general  ESTIMATED BLOOD LOSS: 50 cc  FINDING(S): 1.  Continuous Doppler audible flow signatures are appropriate for each carotid artery. 2.  No evidence of intimal flap visualized on transverse or longitudinal ultrasonography. 3.  Calcified near occluding carotid plaque.  SPECIMEN(S):  None  INDICATIONS:   Michael [name] is a 79 y.o. male who presents with right asymptomatic carotid stenosis 40-59% on recent duplex.  But the right internal carotid artery velocities are depressed by heavy calcification as evident on CTA neck, which demonstrated a stenosis already 70-75%.  On carotid duplex, the PSV has increased 100 c/s suggesting progression of disease.  Subsequently, I recommended Right carotid endarterectomy for treatment of his >80% stenosis.  I discussed with the patient the risks, benefits, and alternatives to carotid endarterectomy.  The patient is not a candidate for carotid artery stenting. I discussed the procedural details of carotid endarterectomy with the patient.  The patient is aware that the risks of carotid endarterectomy include but are not limited to: bleeding, infection, stroke, myocardial infarction, death, cranial nerve injuries both temporary and permanent, neck hematoma, possible airway compromise, labile blood pressure post-operatively, cerebral hyperperfusion syndrome, and possible need for additional interventions in the future. The patient is aware of the risks and agrees to proceed forward with the procedure.  DESCRIPTION: After full informed written consent was obtained from the patient, the patient was brought back to the operating room and  placed supine upon the operating table.  Prior to induction, the patient received IV antibiotics.  After obtaining adequate anesthesia, the patient was placed into semi-Fowler position with a shoulder roll in place and the patient's neck slightly hyperextended and rotated away from the surgical site.  The patient was prepped in the standard fashion for a right carotid endarterectomy.  I made an incision anterior to the sternocleidomastoid muscle and dissected down through the subcutaneous tissue.  The platysmas was opened with electrocautery.  Then I dissected down to the internal jugular vein.  This was dissected posteriorly until I obtained visualization of the common carotid artery.  This was dissected out and then an umbilical tape was placed around the common carotid artery and I loosely applied a Rumel tourniquet.  I then dissected in a periadventitial fashion along the common carotid artery up to the bifurcation.  I then identified the external carotid artery and the superior thyroid artery.  A 2-0 silk tie was looped around the superior thyroid artery, and I also dissected out the external carotid artery and placed a vessel loop around it.  In continuing the dissection to the internal carotid artery, I identified the facial vein.  This was ligated and then transected, giving me improved exposure of the internal carotid artery.  In the process of this dissection, the hypoglossal nerve was identified.  I then dissected out the internal carotid artery until I identified an area of soft tissue in the internal carotid artery.  I dissected slightly distal to this area, and placed an umbilical tape around the artery and loosely applied a Rumel tourniquet.  At this point, we gave the patient a therapeutic bolus of Heparin intravenously (roughly 80 units/kg).  After waiting 3 minutes, then I clamped the internal carotid artery,  external carotid artery and then the common carotid artery.  I then made an arteriotomy in  the common carotid artery with a 11 blade, and extended the arteriotomy with a Potts scissor down into the common carotid artery, then I carried the arteriotomy through the bifurcation into the internal carotid artery until I reached an area that was not diseased.  At this point, I took the 10 shunt that previously been prepared and I inserted it into the internal carotid artery.  The Rumel tourniquet was then applied to this end of the shunt.  I unclamped the shunt to verify retrograde blood flow in the internal carotid artery.  I then placed the other end of the shunt into the common carotid artery after unclamping the artery.  The Rumel was tightened down around the shunt.  At this point, I verified blood flow in the shunt with a continuous doppler.  At this point, I started the endarterectomy in the common carotid artery with a Cytogeneticist and carried this dissection down into the common carotid artery circumferentially.  Then I transected the plaque at a segment where it was adherent.  I then carried this dissection up into the external carotid artery.  The plaque was extracted by unclamping the external carotid artery and everting the artery.  The dissection was then carried into the internal carotid artery, extracting the remaining portion of the carotid plaque.  I passed the plaque off the field as a specimen.  I then spent the next 30 minutes removing intimal flaps and loose debris.  Eventually I reached the point where the residual plaque was densely adherent and any further dissection would compromise the integrity of the wall.  After verifying that there was no more loose intimal flaps or debris, I re-interrogated the entirety of this carotid artery.  At this point, I was satisfied that the minimal remaining disease was densely adherent to the wall and wall integrity was intact.  At this point, I then fashioned a bovine pericardial patch for the geometry of this artery and sewed it in place with  two running stitch of 6-0 Prolene, one from each end.  Prior to completing this patch angioplasty, I removed the shunt first from the internal carotid artery, from which there was excellent backbleeding, and clamped it.  Then I removed the shunt from the common carotid artery, from which there was excellent antegrade bleeding, and then clamped it.  At this point, I allowed the external carotid artery to backbleed, which was excellent.  Then I instilled heparinized saline in this patched artery and then completed the patch angioplasty in the usual fashion.  First, I released the clamp on the external carotid artery, then I released it on the common carotid artery.  After waiting a few seconds, I then released it on the internal carotid artery.  I then interrogated this patient's arteries with the continuous Doppler.  The audible waveforms in each artery were consistent with the expected characteristics for each artery.  The Sonosite probe was then sterilely draped and used to interrogate the carotid artery in both longitudinal and transverse views.  At this point, I washed out the wound, and placed thrombin and Gelfoam throughout.  I also gave the patient 30 mg of protamine to reverse his anticoagulation.   After waiting a few minutes, I removed the thrombin and Gelfoam and washed out the wound.  There was no more active bleeding in the surgical site.   I then reapproximated the platysma muscle  with a running stitch of 3-0 Vicryl.  The skin was then reapproximated with a running subcuticular 4-0 Monocryl stitch.  The skin was then cleaned, dried and Dermabond was used to reinforce the skin closure.  The patient woke without any problems, neurologically intact.     COMPLICATIONS: none  CONDITION: stable  Leonides Sake, MD [facility] [place] Office: [phone] Pager: [phone]  12/18/2012, 10:43 AM

## 2012-12-18 NOTE — Progress Notes (Signed)
Regina at bedside states pt may go to room if BP is > 95.

## 2012-12-18 NOTE — Progress Notes (Signed)
Utilization review completed.  

## 2012-12-18 NOTE — Progress Notes (Signed)
Order received from [place] PA to Give another NS bolus 500 cc's

## 2012-12-18 NOTE — Interval H&P Note (Signed)
[facility] [place]  History and Physical Update  The patient was interviewed and re-examined.  The patient's previous History and Physical has been reviewed and is unchanged.  There is no change in the plan of care: R CEA.  Leonides Sake, MD [facility] [place] Office: [phone] Pager: [phone]  12/18/2012, 7:55 AM

## 2012-12-18 NOTE — Progress Notes (Signed)
Patient received to room 3s09 from PACU alert and oriented. Transferred easily into our bed. Incision clean and intact. Patient and family settled and oriented into the room.

## 2012-12-18 NOTE — Anesthesia Procedure Notes (Signed)
Procedure Name: Intubation Date/Time: 12/18/2012 8:32 AM Performed by: Jerilee Hoh Pre-anesthesia Checklist: Patient identified, Emergency Drugs available, Suction available and Patient being monitored Patient Re-evaluated:Patient Re-evaluated prior to inductionOxygen Delivery Method: Circle system utilized Preoxygenation: Pre-oxygenation with 100% oxygen Intubation Type: IV induction Ventilation: Mask ventilation without difficulty and Oral airway inserted - appropriate to patient size Laryngoscope Size: Mac and 4 Grade View: Grade I Tube type: Oral Tube size: 7.5 mm Number of attempts: 1 Airway Equipment and Method: Stylet Placement Confirmation: ETT inserted through vocal cords under direct vision,  positive ETCO2 and breath sounds checked- equal and bilateral Secured at: 21 cm Tube secured with: Tape Dental Injury: Teeth and Oropharynx as per pre-operative assessment

## 2012-12-18 NOTE — Telephone Encounter (Signed)
Message copied by Jena Gauss on Wed Dec 18, 2012  1:55 PM ------      Message from: Marlowe Shores      Created: Wed Dec 18, 2012 11:00 AM       2-3 week CEA F/U Imogene Burn ------

## 2012-12-18 NOTE — Progress Notes (Signed)
Order received per Dr singer to give albumin 250 cc's 5%

## 2012-12-18 NOTE — Anesthesia Postprocedure Evaluation (Signed)
Anesthesia Post Note  Patient: Michael [name]  Procedure(s) Performed: Procedure(s) (LRB): ENDARTERECTOMY CAROTID WITH BOVINE PATCH ANGIOPLASTY (Right)  Anesthesia type: general  Patient location: PACU  Post pain: Pain level controlled  Post assessment: Patient's Cardiovascular Status Stable  Post vital signs: Reviewed and stable  Level of consciousness: sedated  Complications: No apparent anesthesia complications

## 2012-12-18 NOTE — Anesthesia Preprocedure Evaluation (Signed)
Anesthesia Evaluation  Patient identified by MRN, date of birth, ID band Patient awake    Reviewed: Allergy & Precautions, H&P , NPO status , Patient's Chart, lab work & pertinent test results  Airway Mallampati: I TM Distance: >3 FB Neck ROM: Full    Dental   Pulmonary shortness of breath, COPD + rhonchi   + decreased breath sounds      Cardiovascular hypertension, + CAD, + Past MI and + Peripheral Vascular Disease Rhythm:Regular Rate:Normal     Neuro/Psych Depression CVA    GI/Hepatic GERD-  ,  Endo/Other  Hypothyroidism Hyperthyroidism   Renal/GU Renal InsufficiencyRenal disease     Musculoskeletal   Abdominal   Peds  Hematology   Anesthesia Other Findings   Reproductive/Obstetrics                           Anesthesia Physical Anesthesia Plan  ASA: III  Anesthesia Plan: General   Post-op Pain Management:    Induction: Intravenous  Airway Management Planned: Oral ETT  Additional Equipment: Arterial line  Intra-op Plan:   Post-operative Plan: Extubation in OR  Informed Consent: I have reviewed the patients History and Physical, chart, labs and discussed the procedure including the risks, benefits and alternatives for the proposed anesthesia with the patient or authorized representative who has indicated his/her understanding and acceptance.     Plan Discussed with: CRNA and Surgeon  Anesthesia Plan Comments:         Anesthesia Quick Evaluation

## 2012-12-18 NOTE — H&P (View-Only) (Signed)
[facility] [place]  Established Carotid Patient  History of Present Illness  Michael [name] is a 79 y.o. (1931/07/06) male who presents with chief complaint: routine follow-up.  Previous carotid duplex have been limited by calcification.  Previous CTA demonstrated: RICA 70-75% stenosis, LICA 75-80% stenosis.  Patient has no history of TIA or stroke symptom.  The patient has never had amaurosis fugax or monocular blindness.  The patient has never had facial drooping or hemiplegia.  The patient has never had receptive or expressive aphasia.  The patient notes he has some evidence of possible prostate cancer recurrence with renal involvement.  He is going to get his imaging study today.  Past Medical History  Diagnosis Date  . Hyperthyroidism 1978    RAI  . Shingles 2001  . Vitreous hemorrhage   . PVD (peripheral vascular disease)   . Other symptoms involving cardiovascular system   . Other and unspecified hyperlipidemia   . Unspecified essential hypertension   . Allergic rhinitis, cause unspecified   . Chronic airway obstruction, not elsewhere classified   . History of asbestos exposure   . Left renal mass   . Tobacco abuse   . Coronary atherosclerosis of native coronary artery     stent x 3-one drug eluting  . Depression   . Anemia   . Peripheral arterial disease   . CAD (coronary artery disease)   . Carotid artery occlusion   . Stroke   . Hypothyroidism   . Heart attack unsure, "quite a few"  . Shortness of breath   . Prostate cancer dx'd 05/2006    xrt/IMRT 2008  . Renal cancer dx'd 10/2010    cryoablation in 2012    Past Surgical History  Procedure Laterality Date  . Popliteal synovial cyst excision      knee  . Cholecystectomy    . Cervical disc surgery      fusion  . Partial knee arthroplasty      Right-replacement  . Nasal septum surgery      SMR  . Coronary angioplasty      RCA   . Neck disk fusion  07/2002  . Eye surgery      bilat  cataract  . Knee arthroplasty      bilateral  . Radioactive iodine      treatment for hyperthyroidism  . Endarterectomy  08/30/2011    Procedure: ENDARTERECTOMY CAROTID;  Surgeon: [name], MD;  Location: [facility] OR;  Service: Vascular;  Laterality: Left;  . Joint replacement  2005    Right partial Knee  . Joint replacement  2011    Left toatal Knee    History   Social History  . Marital Status: Widowed    Spouse Name: N/A    Number of Children: N/A  . Years of Education: N/A   Occupational History  . Retired      [employer]   Social History Main Topics  . Smoking status: Former Smoker -- 0.10 packs/day for 60 years    Types: Cigarettes  . Smokeless tobacco: Current User    Types: Chew     Comment: not smoked any since yesterday, trying to quit  . Alcohol Use: No  . Drug Use: No  . Sexual Activity: Not on file   Other Topics Concern  . Not on file   Social History Narrative  . No narrative on file    Family History  Problem Relation Age of Onset  . Allergies    .  Heart disease    . Cancer    . Coronary artery disease    . Coronary artery disease Brother   . Cancer Brother   . Heart disease Brother   . Heart attack Brother   . Anesthesia problems Neg Hx    Current Outpatient Prescriptions on File Prior to Visit  Medication Sig Dispense Refill  . albuterol (PROAIR HFA) 108 (90 BASE) MCG/ACT inhaler Inhale 2 puffs into the lungs every 6 (six) hours as needed. For shortness of breath      . amLODipine (NORVASC) 10 MG tablet Take 1 tablet (10 mg total) by mouth daily.  30 tablet  0  . aspirin 81 MG tablet Take 81 mg by mouth daily.        Marland Kitchen atorvastatin (LIPITOR) 40 MG tablet Take 40 mg by mouth daily.        . cetirizine (ZYRTEC) 10 MG tablet Take 10 mg by mouth daily.       . citalopram (CELEXA) 20 MG tablet Take 20 mg by mouth daily.       . ferrous sulfate 325 (65 FE) MG tablet Take 325 mg by mouth at bedtime.       . Fluticasone-Salmeterol  (ADVAIR DISKUS) 250-50 MCG/DOSE AEPB Inhale 1 puff into the lungs every 12 (twelve) hours.        . hydrochlorothiazide 25 MG tablet Take 12.5 mg by mouth daily.       . isosorbide mononitrate (IMDUR) 60 MG 24 hr tablet Take 1.5 tablets (90 mg total) by mouth daily.  45 tablet  3  . levothyroxine (SYNTHROID, LEVOTHROID) 125 MCG tablet Take 125 mcg by mouth daily.       . mometasone (NASONEX) 50 MCG/ACT nasal spray Place 2 sprays into the nose daily.  17 g  2  . Multiple Vitamin (MULTIVITAMIN) tablet Take 1 tablet by mouth daily.        . naproxen sodium (ANAPROX) 220 MG tablet Take 440 mg by mouth daily as needed. For pain      . nitroGLYCERIN (NITROSTAT) 0.4 MG SL tablet Place 0.4 mg under the tongue every 5 (five) minutes as needed. For chest pain      . Omega-3 Fatty Acids (FISH OIL) 1200 MG CAPS Take 2 capsules by mouth 2 (two) times daily.        . ranitidine (ZANTAC) 150 MG tablet Take 150 mg by mouth 2 (two) times daily.       . Tamsulosin HCl (FLOMAX) 0.4 MG CAPS Take 0.4 mg by mouth daily.        . theophylline (THEODUR) 200 MG 12 hr tablet Take 200 mg by mouth daily.       Marland Kitchen tiotropium (SPIRIVA) 18 MCG inhalation capsule Place 1 capsule (18 mcg total) into inhaler and inhale daily.  90 capsule  2  . traZODone (DESYREL) 100 MG tablet Take 100 mg by mouth at bedtime as needed. For sleep       No current facility-administered medications on file prior to visit.    Allergies  Allergen Reactions  . Ciprofloxacin     REACTION: nausea  . Hydrocodone-Acetaminophen   . Iron Itching  . Lyrica [Pregabalin] Other (See Comments)  . Morphine And Related   . Oxycodone     hives  . Penicillins     REACTION: hives    REVIEW OF SYSTEMS:  (Positives checked otherwise negative)  CARDIOVASCULAR:  []  chest pain, []  chest pressure, []  palpitations, []  shortness  of breath when laying flat, []  shortness of breath with exertion,  []  pain in feet when walking, []  pain in feet when laying flat, []   history of blood clot in veins (DVT), []  history of phlebitis, []  swelling in legs, []  varicose veins  PULMONARY:  []  productive cough, []  asthma, []  wheezing  NEUROLOGIC:  []  weakness in arms or legs, []  numbness in arms or legs, []  difficulty speaking or slurred speech, []  temporary loss of vision in one eye, []  dizziness  HEMATOLOGIC:  []  bleeding problems, []  problems with blood clotting too easily  MUSCULOSKEL:  []  joint pain, []  joint swelling  GASTROINTEST:  []  vomiting blood, []  blood in stool     GENITOURINARY:  []  burning with urination, []  blood in urine  PSYCHIATRIC:  []  history of major depression  INTEGUMENTARY:  []  rashes, []  ulcers  For VQI Use Only  PRE-ADM LIVING: Home  AMB STATUS: Ambulatory  CAD Sx: Stable Angina  PRIOR CHF: None  STRESS TEST: [ ]  No, [x]  Normal, [ ]  + ischemia, [ ]  + MI, [ ]  Both  Physical Examination  Filed Vitals:   12/13/12 1042 12/13/12 1048  BP: 122/63 126/64  Pulse: 57   Height: 5\' 7"  (1.702 m)   Weight: 153 lb 11.2 oz (69.718 kg)   SpO2: 98%    Body mass index is 24.07 kg/(m^2).  General: A&O x 3, WD, thin  Eyes: PERRLA, EOMI  Neck: Supple, no nuchal rigidity, no palpable LAD  Pulmonary: Sym exp, good air movt, CTAB, no rales, rhonchi, & wheezing  Cardiac: RRR, Nl S1, S2, no Murmurs, rubs or gallops  Vascular: Vessel Right Left  Radial Palpable Palpable  Brachial Palpable Palpable  Carotid Palpable, without bruit Palpable, without bruit  Aorta Not palpable N/A  Femoral Palpable Palpable  Popliteal Not palpable Not palpable  PT Palpable Palpable  DP Palpable Palpable   Gastrointestinal: soft, NTND, -G/R, - HSM, - masses, - CVAT B  Musculoskeletal: M/S 5/5 throughout , Extremities without ischemic changes   Neurologic: CN 2-12 intact , Pain and light touch intact in extremities , Motor exam as listed above  Non-Invasive Vascular Imaging  CAROTID DUPLEX (Date: 12/13/2012 ):   R ICA stenosis: 40-59% but  heavily calcified (351/46 prev 251/49 c/s)  R VA:  patent and antegrade  L ICA stenosis: patent CEA  L VA: patent and antegrade  Medical Decision Making  Michael [name] is a 79 y.o. male who presents with: asx R ICA stenosis likely >80%, s/p successful L CEA   There is a nearly 100 c/s increase in velocities in RICA, suggesting increase in stenosis in that artery.  As it was already 70-75%, it likely is >80% at this point.    I offer the patient a repeat CTA neck to confirm this, but he declined but elected to proceed with R CEA.  Based on the patient's vascular studies and examination, I have offered the patient: R CEA.  The pt would like to finish his prostate cancer surveillance with his CT today prior to proceeding with the R CEA.   After the findings are available, he will schedule the R CEA.  I discussed in depth with the patient the nature of atherosclerosis, and emphasized the importance of maximal medical management including strict control of blood pressure, blood glucose, and lipid levels, antiplatelet agents, obtaining regular exercise, and cessation of smoking.    The patient is aware that without maximal medical management the underlying atherosclerotic disease  process will progress, limiting the benefit of any interventions. The patient is currently on a statin: Lipitor.    The patient is currently on an anti-platelet: ASA.  I recommended starting Plavix also while waiting on the R CEA.    If he is scheduled for a renal biopsy based on his CT, I would not start the Plavix due to bleeding concerns with the bx.  Otherwise he will start 75 mg PO daily.  Thank you for allowing [place] to participate in this patient's care.  Leonides Sake, MD [facility] [place] Office: [phone] Pager: [phone]  12/13/2012, 12:09 PM   ---  VASCULAR QUALITY INITIATIVE FOLLOW UP DATA:  Current smoker: [x  ] yes  [  ] no  Living status: [ x ]  Home  [  ]  Nursing home  [  ] Homeless    MEDS:  ASA [x  ] yes  [  ] no- [  ] medical reason  [  ] non compliant  STATIN  [ x ] yes  [  ] no- [  ] medical reason  [  ] non compliant  Beta blocker [  ] yes  [ x ] no- [  ] medical reason  [  ] non compliant  ACE inhibitor [  ] yes  [ x ] no- [  ] medical reason  [  ] non compliant  P2Y12 Antagonist [ x ] none  [  ] clopidogrel-Plavix  [  ] ticlopidine-Ticlid   [  ] prasugrel-Effient  [  ] ticagrelor- Brilinta    Anticoagulant [ x ] None  [  ] warfarin  [  ] rivaroxaban-Xarelto [  ] dabigatran- Pradaxa  Neurologic event since D/C:  [ x ] no  [  ] yes: [  ] eye event  [  ] cortical event  [  ] VB event  [  ] non specific event  [  ] right  [  ] left  [  ] TIA  [  ] stroke  Date:   Modified Rankin Score: 0  MI since D/C: [ x ] no  [  ] troponin only  [  ] EKG or clinical  Cranial nerve injury: [ x  ] none  [  ] resolved  [  ] persistent  Duplex CEA site: [  ] no  [ x ] yes - PSV= 148  EDV= 32  ICA/CCA ratio: 1.55  Stenosis= [ x ] <40% [  ] 40-59% [  ] 60-79%  [  ] > 80%  [  ]  Occluded  CEA site re-operation:  [x  ] no   [  ] yes- date of re-op:  CEA site PCI:   [ x ] no   [  ] yes- date of PCI:

## 2012-12-18 NOTE — Preoperative (Signed)
Beta Blockers   Reason not to administer Beta Blockers:Not Applicable 

## 2012-12-18 NOTE — Transfer of Care (Signed)
Immediate Anesthesia Transfer of Care Note  Patient: Michael [name]  Procedure(s) Performed: Procedure(s): ENDARTERECTOMY CAROTID WITH BOVINE PATCH ANGIOPLASTY (Right)  Patient Location: PACU  Anesthesia Type:General  Level of Consciousness: awake, alert , oriented and patient cooperative  Airway & Oxygen Therapy: Patient Spontanous Breathing and Patient connected to nasal cannula oxygen  Post-op Assessment: Report given to PACU RN, Post -op Vital signs reviewed and stable and Patient moving all extremities  Post vital signs: Reviewed and stable  Complications: No apparent anesthesia complications

## 2012-12-19 ENCOUNTER — Telehealth: Payer: Self-pay

## 2012-12-19 LAB — BASIC METABOLIC PANEL
CO2: 20 mEq/L (ref 19–32)
Chloride: 102 mEq/L (ref 96–112)
Creatinine, Ser: 1.04 mg/dL (ref 0.50–1.35)
GFR calc Af Amer: 77 mL/min — ABNORMAL LOW (ref >90)
Potassium: 4 mEq/L (ref 3.5–5.1)
Sodium: 133 mEq/L — ABNORMAL LOW (ref 135–145)

## 2012-12-19 LAB — CBC
MCV: 94.4 fL (ref 78.0–100.0)
Platelets: 222 10*3/uL (ref 150–400)
RBC: 3.2 MIL/uL — ABNORMAL LOW (ref 4.22–5.81)
RDW: 14.4 % (ref 11.5–15.5)
WBC: 8.6 10*3/uL (ref 4.0–10.5)

## 2012-12-19 NOTE — Progress Notes (Signed)
Pt to d/c home this am. Reviewed d/c instructions, when to call DR or 911, Rx, etc. Pt and family understood.

## 2012-12-19 NOTE — Telephone Encounter (Signed)
Pt's daughter called and left voice message reporting pt. has decreased 02 sat; reports 02 sat. Of 78 %, then 80 %.  (Pt's son is an EMT, and had a pulse oximeter machine to check pt's 02 saturation.)  Reported after rechecking the 02 sat. The level came up to 91%, and then dropped again to 85%.  Stated pt's. pulse rate on the pulse oximeter monitor registered 135 bpm.  Pt. is not on 0xygen at home.  Son denies that pt. has any sob, chest pain, or other symptoms.  Reports pt's. skin warm/ dry and pt. alert.  Notified Dr. Imogene Burn of fast pulse rate, and low 02 sats.   Recommends to check pulse rate manually, and not rely on the pulse ox monitor for accuracy.  Advised pt. should be closely monitored and to be taken to the ER if 02 Sat. remains low, or if pulse rate is rapid.   Also, stated that with pt's. Hx. of smoking, pt's baseline pulse ox is in the upper 80's.  Returned call to the son, Loraine Leriche.  Advised to check pulse manually; check radial pulse x 30 seconds while nurse on phone, and stated pulse rate is "68 bpm", and it was "regular".   Advised son to take pt. To the ER if pulse ox stayed lower than 85, and if pulse rate continues to be irregular in rate.  Son agreed that he will monitor pt. closely, and will take to ER if symptoms worsen.

## 2012-12-19 NOTE — Discharge Summary (Signed)
[facility] Discharge Summary   Patient ID:  Michael [name] MRN: [id number] DOB/AGE: July 03, 1931 79 y.o.  Admit date: 12/18/2012 Discharge date: 12/19/2012 Date of Surgery: 12/18/2012 Surgeon: Surgeon(s): [name], MD  Admission Diagnosis: Right Internal Carotid Artery Stenosis   Discharge Diagnoses:  Right Internal Carotid Artery Stenosis   Secondary Diagnoses: Past Medical History  Diagnosis Date  . Hyperthyroidism 1978    RAI  . Shingles 2001  . Vitreous hemorrhage   . PVD (peripheral vascular disease)   . Other symptoms involving cardiovascular system   . Other and unspecified hyperlipidemia   . Unspecified essential hypertension   . Allergic rhinitis, cause unspecified   . Chronic airway obstruction, not elsewhere classified   . History of asbestos exposure   . Left renal mass   . Tobacco abuse   . Depression   . Anemia   . Peripheral arterial disease   . Carotid artery occlusion   . Hypothyroidism   . Heart attack unsure, "quite a few"  . Prostate cancer dx'd 05/2006    xrt/IMRT 2008  . Renal cancer dx'd 10/2010    cryoablation in 2012  . Coronary atherosclerosis of native coronary artery     stent x 3-one drug eluting-'03, '04,'05  . CAD (coronary artery disease)   . Stroke     DENIES  . Shortness of breath     WIITH EXERTION  . GERD (gastroesophageal reflux disease)   . Hemorrhoids   . Head injury, closed, with brief LOC   . Arthritis   . HOH (hard of hearing)   . HOH (hard of hearing)     Procedure(s): ENDARTERECTOMY CAROTID WITH BOVINE PATCH ANGIOPLASTY  Discharged Condition: good  HPI:  Michael [name] is a 79 y.o. ([birth year]) male who presents with chief complaint: routine follow-up. Previous carotid duplex have been limited by calcification. Previous CTA demonstrated: RICA 70-75% stenosis, LICA 75-80% stenosis. Patient has no history of TIA or stroke symptom. The patient has never had amaurosis fugax or monocular  blindness. The patient has never had facial drooping or hemiplegia. The patient has never had receptive or expressive aphasia. The patient notes he has some evidence of possible prostate cancer recurrence with renal involvement. There is a nearly 100 c/s increase in velocities in RICA, suggesting increase in stenosis in that artery. As it was already 70-75%, it likely is >80% at this point. I offer the patient a repeat CTA neck to confirm this, but he declined but elected to proceed with R CEA.    Hospital Course:  Michael [name] is a 79 y.o. male is S/P Right Procedure(s): ENDARTERECTOMY CAROTID WITH BOVINE PATCH ANGIOPLASTY Extubated: POD # 0 Physical exam: neuro exam intact and WNL Post-op wounds healing well Pt. Ambulating, voiding and taking PO diet without difficulty. Pt pain controlled with PO pain meds. Labs as below Complications:post-op hypotension with resolution after IV fluid boluses  Consults:     Significant Diagnostic Studies: CBC Lab Results  Component Value Date   WBC 8.6 12/19/2012   HGB 10.5* 12/19/2012   HCT 30.2* 12/19/2012   MCV 94.4 12/19/2012   PLT 222 12/19/2012    BMET    Component Value Date/Time   NA 133* 12/19/2012 0440   K 4.0 12/19/2012 0440   CL 102 12/19/2012 0440   CO2 20 12/19/2012 0440   GLUCOSE 92 12/19/2012 0440   BUN 6 12/19/2012 0440   CREATININE 1.04 12/19/2012 0440   CALCIUM 8.3* 12/19/2012 0440  GFRNONAA 66* 12/19/2012 0440   GFRAA 77* 12/19/2012 0440   COAG Lab Results  Component Value Date   INR 0.95 12/17/2012   INR 0.91 08/29/2011   INR 0.99 12/02/2010     Disposition:  Discharge to :Home Discharge Orders   Future Appointments Provider Department Dept Phone   12/24/2012 4:00 PM [facility] [phone]   01/03/2013 9:15 AM [name], MD [facility] [phone]   04/08/2013 3:45 PM Waymon Budge, MD [facility] [phone]   Future Orders  Complete By Expires   Call MD for:  redness, tenderness, or signs of infection (pain, swelling, bleeding, redness, odor or green/yellow discharge around incision site)  As directed    Call MD for:  severe or increased pain, loss or decreased feeling  in affected limb(s)  As directed    Call MD for:  temperature >100.5  As directed    CAROTID [place]: Call MD for difficulty swallowing or speaking; weakness in arms or legs that is a new symtom; severe headache.  If you have increased swelling in the neck and/or  are having difficulty breathing, CALL 911  As directed    Driving Restrictions  As directed    Comments:     No driving for 2 weeks   Increase activity slowly  As directed    Comments:     Walk with assistance use walker or cane as needed   Lifting restrictions  As directed    Comments:     No lifting for 4 weeks   May shower   As directed    Scheduling Instructions:     Friday   may wash over wound with mild soap and water  As directed    No dressing needed  As directed    Resume previous diet  As directed        Medication List         ADVAIR DISKUS 250-50 MCG/DOSE Aepb  Generic drug:  Fluticasone-Salmeterol  Inhale 1 puff into the lungs every 12 (twelve) hours.     amLODipine 10 MG tablet  Commonly known as:  NORVASC  Take 1 tablet (10 mg total) by mouth daily.     aspirin 81 MG tablet  Take 81 mg by mouth daily.     atorvastatin 40 MG tablet  Commonly known as:  LIPITOR  Take 40 mg by mouth daily.     cetirizine 10 MG tablet  Commonly known as:  ZYRTEC  Take 10 mg by mouth daily.     citalopram 20 MG tablet  Commonly known as:  CELEXA  Take 20 mg by mouth daily.     ferrous sulfate 325 (65 FE) MG tablet  Take 325 mg by mouth at bedtime.     Fish Oil 1200 MG Caps  Take 2 capsules by mouth 2 (two) times daily.     FLOMAX 0.4 MG Caps capsule  Generic drug:  tamsulosin  Take 0.4 mg by mouth daily.     hydrochlorothiazide 25 MG tablet  Commonly known  as:  HYDRODIURIL  Take 12.5 mg by mouth daily.     isosorbide mononitrate 60 MG 24 hr tablet  Commonly known as:  IMDUR  Take 1.5 tablets (90 mg total) by mouth daily.     levothyroxine 125 MCG tablet  Commonly known as:  SYNTHROID, LEVOTHROID  Take 125 mcg by mouth daily.     mometasone 50 MCG/ACT  nasal spray  Commonly known as:  NASONEX  Place 2 sprays into the nose daily.     multivitamin tablet  Take 1 tablet by mouth daily.     naproxen sodium 220 MG tablet  Commonly known as:  ANAPROX  Take 440 mg by mouth daily as needed. For pain     nitroGLYCERIN 0.4 MG SL tablet  Commonly known as:  NITROSTAT  Place 0.4 mg under the tongue every 5 (five) minutes as needed. For chest pain     PROAIR HFA 108 (90 BASE) MCG/ACT inhaler  Generic drug:  albuterol  Inhale 2 puffs into the lungs every 6 (six) hours as needed. For shortness of breath     ranitidine 150 MG tablet  Commonly known as:  ZANTAC  Take 150 mg by mouth 2 (two) times daily.     theophylline 200 MG 12 hr tablet  Commonly known as:  THEODUR  Take 100 mg by mouth daily.     tiotropium 18 MCG inhalation capsule  Commonly known as:  SPIRIVA  Place 1 capsule (18 mcg total) into inhaler and inhale daily.     traMADol 50 MG tablet  Commonly known as:  ULTRAM  Take 1 tablet (50 mg total) by mouth every 6 (six) hours as needed for pain.     traZODone 100 MG tablet  Commonly known as:  DESYREL  Take 100 mg by mouth at bedtime as needed. For sleep       Verbal and written Discharge instructions given to the patient. Wound care per Discharge AVS     Follow-up Information   Follow up with Nilda Simmer, MD In 2 weeks. (office will arrange-sent)    Specialty:  Vascular Surgery   Contact information:   [address] [place] [address] [phone]       Signed: Marlowe Shores 12/19/2012, 7:45 AM  Addendum  I have independently interviewed and examined the patient, and I agree with the  physician assistant's discharge summary.  The patient underwent uneventful R CEA.  His post-operative course was notable for some post-op hypotension responsive to hydration.  Some baseline arrhythmia manifested post-op, requiring some electrolyte replacement.  By discharge, the patient had stable blood pressure with intact neurologic status.  Leonides Sake, MD [facility] [place] Office: [phone] Pager: [phone]  12/19/2012, 9:29 AM   --- For VQI Registry use --- Instructions: Press F2 to tab through selections.  Delete question if not applicable.   Modified Rankin score at D/C (0-6): Rankin Score=0  IV medication needed for:  1. Hypertension: No 2. Hypotension: Yes  Post-op Complications: No  1. Post-op CVA or TIA: No   2. CN injury: No   3. Myocardial infarction: No   4.  CHF: No  5.  Dysrhythmia (new): No  6. Wound infection: No  7. Reperfusion symptoms: No  8. Return to OR: No   Discharge medications: Statin use:  Yes ASA use:  Yes Beta blocker use:  Yes ACE-Inhibitor use: No P2Y12 Antagonist use: [x ] None, [ ]  Plavix, [ ]  Plasugrel, [ ]  Ticlopinine, [ ]  Ticagrelor, [ ]  Other, [ ]  No for medical reason, [ ]  Non-compliant, [ ]  Not-indicated Anti-coagulant use:  [ x] None, [ ]  Warfarin, [ ]  Rivaroxaban, [ ]  Dabigatran, [ ]  Other, [ ]  No for medical reason, [ ]  Non-compliant, [ ]  Not-indicated

## 2012-12-20 ENCOUNTER — Telehealth: Payer: Self-pay

## 2012-12-20 NOTE — Telephone Encounter (Signed)
Discussed question of pt's. Need to start Plavix with Dr. Imogene Burn.  Advised that pt. does not need to start Plavix, but should take ASA 81 mg qd.  Daughter made aware of recommendation.  Verb. Understanding.

## 2012-12-20 NOTE — Telephone Encounter (Signed)
Phone call rec'd from son, Loraine Leriche.  Questioned about any pain, tenderness, redness, warmth, or inflammation in right calf/ lower extremity.  Son stated pt. denied any of the above symptoms.  Son stated there is "a little swelling in the left foot and ankle, but that the swelling is worse in the right foot, ankle and up to the calf."  Encouraged elevation of bilateral lower extremities above level of heart at intervals.  Also recommended to have pt. cough and deep breathe, every hour, while awake, to prevent potential of pneumonia, while pt. is less active.  Son verb. understanding. Encouraged son to follow through, as discussed w/ daughter, and let the pt's. PCP know about pt's swelling of lower extremities.  Also, encouraged to call [facility] if further concerns.  Agreed.

## 2012-12-20 NOTE — Telephone Encounter (Addendum)
Pt's daughter called to report pt.  has some swelling in the top of right foot, ankle, and lower leg.  States this is new.  Denies swelling in the left leg. Gave update on pulse ox; 88-91%.  States pulse has been regular in the low 70's range.  Reports "some puffiness in the right side of face".  Advised some swelling in the neck and up into the face is not uncommon following carotid surgery.  Daughter also questioned if pt. needs to start Plavix; stated this was discussed at office visit 8/15, to be started after renal biopsy.  Stated the Plavix just arrived from [facility], and unsure if pt. is supposed to start it.   Recommended to report swelling of right leg to PCP, since pt. had some heart arrhythmia and low BP, while in hospital recently.  Discussed w/ Dr. Imogene Burn.  Advised to question for symptoms of DVT.  Daughter requested that I speak with pt's son, who is with him at this time.

## 2012-12-23 DIAGNOSIS — J438 Other emphysema: Secondary | ICD-10-CM | POA: Diagnosis not present

## 2012-12-23 DIAGNOSIS — R609 Edema, unspecified: Secondary | ICD-10-CM | POA: Diagnosis not present

## 2012-12-23 DIAGNOSIS — F17200 Nicotine dependence, unspecified, uncomplicated: Secondary | ICD-10-CM | POA: Diagnosis not present

## 2012-12-23 NOTE — OR Nursing (Signed)
LATE ENTRY:  Departure time entered.

## 2012-12-24 ENCOUNTER — Encounter (HOSPITAL_COMMUNITY): Payer: Self-pay | Admitting: Vascular Surgery

## 2012-12-24 ENCOUNTER — Ambulatory Visit
Admission: RE | Admit: 2012-12-24 | Discharge: 2012-12-24 | Disposition: A | Discharge status: Home / Self Care | Payer: Medicare Other | Source: Ambulatory Visit | Attending: Interventional Radiology | Admitting: Interventional Radiology

## 2012-12-24 DIAGNOSIS — R609 Edema, unspecified: Secondary | ICD-10-CM | POA: Diagnosis not present

## 2012-12-24 DIAGNOSIS — N279 Small kidney, unspecified: Secondary | ICD-10-CM | POA: Diagnosis not present

## 2012-12-24 DIAGNOSIS — N2889 Other specified disorders of kidney and ureter: Secondary | ICD-10-CM

## 2012-12-26 NOTE — Progress Notes (Signed)
Patient ID: Michael [name], male   DOB: 11-Jan-1932, 79 y.o.   MRN: [id number]  ESTABLISHED PATIENT OFFICE VISIT  Chief Complaint: Status post percutaneous cryoablation of a left renal papillary carcinoma on 11/25/2010.  History: Michael [name] returns for follow-up 2 years after ablation. He has been doing well and recently underwent an uncomplicated right carotid endarterectomy performed by Michael [name] on 12/18/2012. Dr. Laverle [name] has been following a rising PSA level. The patient has a history of prostate carcinoma.  Review of Systems: No abdominal or flank pain. No fever or chills. No hematuria, dysuria or change in urinary habits.  Exam: Vital signs: Blood pressure 116/61, pulse 76, respirations 17, temperature 98.3 and oxygen saturation 95% on room air. General: No distress. Abdomen: Soft and nontender. No flank tenderness.  Labs: BUN 6, creatinine 1.04 and estimated GFR 66 ml/minute on 12/19/2012.  Imaging: Follow-up CT was performed on 12/13/2012. Post ablation changes are stable at the level of the treated left interpolar carcinoma. No abnormal enhancement is identified. Stable cysts are present in both kidneys including a 2 cm hemorrhagic cyst of the left kidney.  Assessment and Plan: No evidence of tumor recurrence 2 years status post cryoablation of the left kidney. I have recommended follow-up CT of the abdomen in 1 year.

## 2013-01-02 ENCOUNTER — Encounter: Payer: Self-pay | Admitting: Vascular Surgery

## 2013-01-03 ENCOUNTER — Ambulatory Visit (INDEPENDENT_AMBULATORY_CARE_PROVIDER_SITE_OTHER): Payer: Self-pay | Admitting: Vascular Surgery

## 2013-01-03 ENCOUNTER — Encounter: Payer: Self-pay | Admitting: Vascular Surgery

## 2013-01-03 DIAGNOSIS — I6529 Occlusion and stenosis of unspecified carotid artery: Secondary | ICD-10-CM

## 2013-01-03 NOTE — Progress Notes (Signed)
[facility]   Postoperative Visit  History of Present Illness  Michael [name] is a 79 y.o. male who presents for postoperative follow-up for: R CEA (Date: 12/18/12).  The patient's neck incision is healed.  The patient has had no stroke or TIA symptoms.  He had some cardiac arrhythmia post-op which has resolved at this point.  For [facility] Use Only  PRE-ADM LIVING: Home  AMB STATUS: Ambulatory  History   Social History  . Marital Status: Widowed    Spouse Name: N/A    Number of Children: N/A  . Years of Education: N/A   Occupational History  . Retired      [employer]   Social History Main Topics  . Smoking status: Current Every Day Smoker -- 0.10 packs/day for 60 years    Types: Cigarettes  . Smokeless tobacco: Current User    Types: Chew     Comment: not smoked any since yesterday, trying to quit  . Alcohol Use: No  . Drug Use: No  . Sexual Activity: Not on file   Other Topics Concern  . Not on file   Social History Narrative  . No narrative on file    Current Outpatient Prescriptions on File Prior to Visit  Medication Sig Dispense Refill  . albuterol (PROAIR HFA) 108 (90 BASE) MCG/ACT inhaler Inhale 2 puffs into the lungs every 6 (six) hours as needed. For shortness of breath      . amLODipine (NORVASC) 10 MG tablet Take 1 tablet (10 mg total) by mouth daily.  30 tablet  0  . aspirin 81 MG tablet Take 81 mg by mouth daily.        Marland Kitchen atorvastatin (LIPITOR) 40 MG tablet Take 40 mg by mouth daily.        . cetirizine (ZYRTEC) 10 MG tablet Take 10 mg by mouth daily.       . citalopram (CELEXA) 20 MG tablet Take 20 mg by mouth daily.       . ferrous sulfate 325 (65 FE) MG tablet Take 325 mg by mouth at bedtime.       . Fluticasone-Salmeterol (ADVAIR DISKUS) 250-50 MCG/DOSE AEPB Inhale 1 puff into the lungs every 12 (twelve) hours.        . hydrochlorothiazide 25 MG tablet Take 12.5 mg by mouth daily.       . isosorbide mononitrate  (IMDUR) 60 MG 24 hr tablet Take 1.5 tablets (90 mg total) by mouth daily.  45 tablet  3  . levothyroxine (SYNTHROID, LEVOTHROID) 125 MCG tablet Take 125 mcg by mouth daily.       . mometasone (NASONEX) 50 MCG/ACT nasal spray Place 2 sprays into the nose daily.  17 g  2  . Multiple Vitamin (MULTIVITAMIN) tablet Take 1 tablet by mouth daily.        . naproxen sodium (ANAPROX) 220 MG tablet Take 440 mg by mouth daily as needed. For pain      . nitroGLYCERIN (NITROSTAT) 0.4 MG SL tablet Place 0.4 mg under the tongue every 5 (five) minutes as needed. For chest pain      . Omega-3 Fatty Acids (FISH OIL) 1200 MG CAPS Take 2 capsules by mouth 2 (two) times daily.        . ranitidine (ZANTAC) 150 MG tablet Take 150 mg by mouth 2 (two) times daily.       . Tamsulosin HCl (FLOMAX) 0.4 MG CAPS Take 0.4 mg by mouth daily.        Marland Kitchen  theophylline (THEODUR) 200 MG 12 hr tablet Take 100 mg by mouth 2 (two) times daily.       Marland Kitchen tiotropium (SPIRIVA) 18 MCG inhalation capsule Place 1 capsule (18 mcg total) into inhaler and inhale daily.  90 capsule  2  . traMADol (ULTRAM) 50 MG tablet Take 1 tablet (50 mg total) by mouth every 6 (six) hours as needed for pain.  20 tablet  0  . traZODone (DESYREL) 100 MG tablet Take 100 mg by mouth at bedtime as needed. For sleep       No current facility-administered medications on file prior to visit.    Physical Examination  Filed Vitals:   01/03/13 0916  BP: 121/73  Pulse:   Temp:     R Neck: Incision is healed Neuro: CN 2-12 are intact , Motor strength is 5/5  bilaterally, sensation is grossly intact  Medical Decision Making  Michael [name] is a 79 y.o. male who presents s/p R CEA.  The patient's neck incision is healing with no stroke symptoms. I discussed in depth with the patient the nature of atherosclerosis, and emphasized the importance of maximal medical management including strict control of blood pressure, blood glucose, and lipid levels, obtaining regular  exercise, anti-platelet use and cessation of smoking.   The patient is currently on an antiplatelet: ASA. The patient is currently  on a statin: Lipitor.   The patient is aware that without maximal medical management the underlying atherosclerotic disease process will progress, limiting the benefit of any interventions. The patient's surveillance will included routine carotid duplex studies which will be completed in: 6 months, at which time the patient will be re-evaluated.   I emphasized the importance of routine surveillance of the carotid arteries as recurrence of stenosis is possible, especially with proper management of underlying atherosclerotic disease. The patient agrees to participate in their maximal medical care and routine surveillance.  Thank you for allowing [place] to participate in this patient's care.  Leonides Sake, MD [facility] [place] Office: [phone] Pager: [phone]

## 2013-01-06 DIAGNOSIS — Z23 Encounter for immunization: Secondary | ICD-10-CM | POA: Diagnosis not present

## 2013-01-14 DIAGNOSIS — J438 Other emphysema: Secondary | ICD-10-CM | POA: Diagnosis not present

## 2013-01-14 DIAGNOSIS — I10 Essential (primary) hypertension: Secondary | ICD-10-CM | POA: Diagnosis not present

## 2013-01-14 DIAGNOSIS — R609 Edema, unspecified: Secondary | ICD-10-CM | POA: Diagnosis not present

## 2013-01-14 DIAGNOSIS — F17200 Nicotine dependence, unspecified, uncomplicated: Secondary | ICD-10-CM | POA: Diagnosis not present

## 2013-01-14 DIAGNOSIS — I708 Atherosclerosis of other arteries: Secondary | ICD-10-CM | POA: Diagnosis not present

## 2013-01-14 DIAGNOSIS — R413 Other amnesia: Secondary | ICD-10-CM | POA: Diagnosis not present

## 2013-01-21 DIAGNOSIS — IMO0001 Reserved for inherently not codable concepts without codable children: Secondary | ICD-10-CM | POA: Diagnosis not present

## 2013-01-21 DIAGNOSIS — R41841 Cognitive communication deficit: Secondary | ICD-10-CM | POA: Diagnosis not present

## 2013-01-21 DIAGNOSIS — R413 Other amnesia: Secondary | ICD-10-CM | POA: Diagnosis not present

## 2013-01-21 DIAGNOSIS — F0390 Unspecified dementia without behavioral disturbance: Secondary | ICD-10-CM | POA: Diagnosis not present

## 2013-01-23 ENCOUNTER — Other Ambulatory Visit: Payer: Self-pay | Admitting: *Deleted

## 2013-01-29 DIAGNOSIS — M67919 Unspecified disorder of synovium and tendon, unspecified shoulder: Secondary | ICD-10-CM | POA: Diagnosis not present

## 2013-01-29 DIAGNOSIS — R21 Rash and other nonspecific skin eruption: Secondary | ICD-10-CM | POA: Diagnosis not present

## 2013-03-01 DIAGNOSIS — J189 Pneumonia, unspecified organism: Secondary | ICD-10-CM

## 2013-03-01 HISTORY — DX: Pneumonia, unspecified organism: J18.9

## 2013-03-03 DIAGNOSIS — I10 Essential (primary) hypertension: Secondary | ICD-10-CM | POA: Diagnosis not present

## 2013-03-03 DIAGNOSIS — J438 Other emphysema: Secondary | ICD-10-CM | POA: Diagnosis not present

## 2013-03-03 DIAGNOSIS — F17200 Nicotine dependence, unspecified, uncomplicated: Secondary | ICD-10-CM | POA: Diagnosis not present

## 2013-03-03 DIAGNOSIS — R413 Other amnesia: Secondary | ICD-10-CM | POA: Diagnosis not present

## 2013-03-03 DIAGNOSIS — R609 Edema, unspecified: Secondary | ICD-10-CM | POA: Diagnosis not present

## 2013-03-03 DIAGNOSIS — I708 Atherosclerosis of other arteries: Secondary | ICD-10-CM | POA: Diagnosis not present

## 2013-03-06 ENCOUNTER — Other Ambulatory Visit: Payer: Self-pay

## 2013-03-26 DIAGNOSIS — C61 Malignant neoplasm of prostate: Secondary | ICD-10-CM | POA: Diagnosis not present

## 2013-03-26 DIAGNOSIS — D4959 Neoplasm of unspecified behavior of other genitourinary organ: Secondary | ICD-10-CM | POA: Diagnosis not present

## 2013-04-02 ENCOUNTER — Ambulatory Visit (HOSPITAL_COMMUNITY)
Admission: RE | Admit: 2013-04-02 | Discharge: 2013-04-02 | Disposition: A | Discharge status: Home / Self Care | Payer: Medicare Other | Source: Ambulatory Visit | Attending: Urology | Admitting: Urology

## 2013-04-02 ENCOUNTER — Other Ambulatory Visit (HOSPITAL_COMMUNITY): Payer: Self-pay | Admitting: Urology

## 2013-04-02 DIAGNOSIS — D4959 Neoplasm of unspecified behavior of other genitourinary organ: Secondary | ICD-10-CM

## 2013-04-02 DIAGNOSIS — D499 Neoplasm of unspecified behavior of unspecified site: Secondary | ICD-10-CM | POA: Diagnosis not present

## 2013-04-02 DIAGNOSIS — C61 Malignant neoplasm of prostate: Secondary | ICD-10-CM | POA: Diagnosis not present

## 2013-04-02 DIAGNOSIS — J438 Other emphysema: Secondary | ICD-10-CM | POA: Insufficient documentation

## 2013-04-02 DIAGNOSIS — N289 Disorder of kidney and ureter, unspecified: Secondary | ICD-10-CM | POA: Diagnosis not present

## 2013-04-03 ENCOUNTER — Encounter: Payer: Self-pay | Admitting: Internal Medicine

## 2013-04-07 DIAGNOSIS — R05 Cough: Secondary | ICD-10-CM | POA: Diagnosis not present

## 2013-04-07 DIAGNOSIS — F17200 Nicotine dependence, unspecified, uncomplicated: Secondary | ICD-10-CM | POA: Diagnosis not present

## 2013-04-07 DIAGNOSIS — J438 Other emphysema: Secondary | ICD-10-CM | POA: Diagnosis not present

## 2013-04-08 ENCOUNTER — Encounter: Payer: Self-pay | Admitting: Internal Medicine

## 2013-04-08 ENCOUNTER — Ambulatory Visit (INDEPENDENT_AMBULATORY_CARE_PROVIDER_SITE_OTHER): Payer: Medicare Other | Admitting: Internal Medicine

## 2013-04-08 VITALS — BP 118/64 | HR 88 | Ht 66.5 in | Wt 158.2 lb

## 2013-04-08 DIAGNOSIS — J0100 Acute maxillary sinusitis, unspecified: Secondary | ICD-10-CM | POA: Diagnosis not present

## 2013-04-08 DIAGNOSIS — F17200 Nicotine dependence, unspecified, uncomplicated: Secondary | ICD-10-CM | POA: Diagnosis not present

## 2013-04-08 DIAGNOSIS — J438 Other emphysema: Secondary | ICD-10-CM | POA: Diagnosis not present

## 2013-04-08 DIAGNOSIS — J439 Emphysema, unspecified: Secondary | ICD-10-CM

## 2013-04-08 NOTE — Progress Notes (Signed)
Patient ID: Michael [name], male    DOB: 1932-03-04, 79 y.o.   MRN: [id number] PCP [name] HPI 08/14/00- 46 yo M current smoker, coming for f/u of COPD and allergic rhinitis. Had pneumonia about a month ago and brings CXR from 07/18/10 done by [name], showing COPD but NAD on my screen. . Still smokes 1 Pack/ 3days.  Admits daily morning cough, usually clear or slight yellow sputum but no blood. Admits some SOB, but exercises 3 days/ week at rehab center. Wheezes some. Continues Advair, Spiriva. Proair doesn't help a lot. Had cardiac evaluation/ stress test by cardiologist- "ok".  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 He brings a newsletter from [employer] and from [place], and from them he asks about CT to assess for asbestos related changes.   09/23/10 Smoker with hx asbestos exposure, COPD, allergic rhinitis. Newly dx'd renal cancer- to see [name]. Daughter here He notes a little cough occasionallly , not much. Denies chest pain, palpitation.He has not really tried to stop smoking. I reviewed his CT abd which included some lung level views and shared the images with them today. No lung abnormality described, but there is significant atherosclerosis.   02/03/11- Smoker with hx asbestos exposure, COPD, allergic rhinitis. Newly dx'd renal cancer  .. family here Had cryosurgery for his left renal cancer.- so far so good. Has had flu vaccine. Had chest x-ray since last here. Chest feels better with theophylline which was well tolerated. He still smokes one half pack per day we discussed trying again. We suggested nicotine replacement approaches since I notice he is chewing gum today.  08/24/11- 48 yoM Smoker with hx asbestos exposure, COPD, allergic rhinitis.  renal cancer  .. daughter here   PCP [name]. [name] Still smoking one quarter pack per day. Blames pollen for head congestion and sneeze. Admits gradually worsening dyspnea  on exertion. Coughs more in drafts or after exertion. Daily cough productive of white or yellow sputum with no blood and no chest pain. Spiriva continues to be helpful. Has 80% left carotid lesion, pending evaluation with Dr. [name]. Hx bilateral TKR, but does exercise on bike and treadmill. CXR- 08/23/11 CHEST - 2 VIEW  Comparison: 03/03/2011  Findings: COPD. Pulmonary hyperinflation is present with mild  scarring. Negative for pneumonia or mass lesion. Negative for  heart failure or effusion. Surgical fusion is present.  IMPRESSION:  COPD. No acute cardiopulmonary disease.  Original Report Authenticated By: [name], M.D.   03/01/12- 25 yoM Smoker with hx asbestos exposure, COPD, allergic rhinitis.  renal cancer  .. daughter here  Slight cough at times.   PCP [name] Had CEA in the Spring- did ok.  Has had flu vaccine Light and occasional cough. No change in dyspnea with exertion. Still goes dancing some. Has not tried to stop smoking but we worked on this again today. I'm hoping we can get him motivated to try. His urologist/Dr. Alinda Money asked him to get another chest x-ray. Patient chooses to get that done while here today COPD assessment test (CAT) score 9/40 CXR 08/22/11-reviewed IMPRESSION:  COPD. No acute cardiopulmonary disease.  Original Report Authenticated By: [name], M.D.    10/07/12- 5 yoM Smoker with hx asbestos exposure, COPD, allergic rhinitis.  renal cancer   DTR [name] here FOLLOWS FOR: wheezing while lying down, SOB early in the morning, cough-productive-brown in color. So smoking 1  pack per week against counseling. Is not trying to cut down below this. He reports a chest x-ray at [facility] in May of 2014. We are seeking that report but he does not understand there was any problem. PFT: 06/10/2012/[place]-mild obstructive airways disease with insignificant response to bronchodilator, hyperinflation and air trapping, diffusion  moderately reduced. FVC 3.36/87%, FEV1 2.20/88%, FEV1/FVC of 0.66, TLC 121%, RV 148%, DLCO 54%. CXR 03/14/12 Findings: Unchanged linear lingular scarring. The lungs are clear.  No pulmonary edema, focal airspace consolidation or pulmonary  nodule. Chronic coarsening of the interstitial markings remains  unchanged. There is pectus excavatum on the lateral view.  Incompletely imaged anterior cervical fusion hardware at C7.  Tortuous thoracic aorta. Atherosclerotic calcifications in the  transverse aorta. No acute osseous abnormality.  IMPRESSION:  No acute osseous abnormality.  Pectus excavatum.  Original Report Authenticated By: [name], M.D.  04/08/13- 89 yoM Smoker with hx asbestos exposure, COPD, allergic rhinitis.  renal cancer   DTR [name] here FOLLOWS FOR: has SOB, wheezing, cough, and congestion at times. Currently being treated for sinus infection by Dr Leandrew Koyanagi. PCP gave levaquin x 5 d for sinusitis- improving.Smokes after meals- discussed. Baseline is some cough, clear phlegm most days. No acute chest pain, blood or fever. Pending TKR/ Dr Despina Hick. CXR 04/03/13 IMPRESSION:  1. No acute cardiopulmonary process.  2. Emphysematous change  Electronically Signed  By: Genevive Bi M.D.  On: 04/02/2013 13:30  . Review of Systems-See HPI Constitutional:   No-   weight loss, night sweats, fevers, chills, fatigue, lassitude. HEENT:   No-  headaches, difficulty swallowing, tooth/dental problems, sore throat,       No-  sneezing, itching, ear ache,  +nasal congestion, post nasal drip,  CV:  No-   chest pain, orthopnea, PND, swelling in lower extremities, anasarca, dizziness, palpitations Resp: + shortness of breath with exertion or at rest.              + productive cough, little non-productive cough,  No-  coughing up of blood.              No-   change in color of mucus.  No- wheezing.   Skin: No-   rash or lesions. GI:  No-   heartburn, indigestion, abdominal pain, nausea,  vomiting,  GU:  MS:  No-   joint pain or swelling.  Neuro- nothing unusual Psych:  No- change in mood or affect. No depression or anxiety.  No memory loss.  OBJ General- Alert, Oriented, Affect-appropriate, Distress- none acute; normal weight Skin- rash-none, lesions- none, excoriation- none Lymphadenopathy- none Head- atraumatic            Eyes- Gross vision intact, PERRLA, conjunctivae clear secretions            Ears- Hearing, canals-normal            Nose- Clear, no-Septal dev, mucus, polyps, erosion, perforation             Throat- Mallampati II , mucosa clear , drainage- none, tonsils- atrophic Neck- flexible , trachea midline, no stridor , thyroid nl,  Chest - symmetrical excursion , unlabored           Heart/CV- RRR , no murmur , no gallop  , no rub, nl s1 s2                           - JVD- none , edema- none, stasis changes- none,  varices- none.                              + L carotid bruit (hx L CEA 2013)           Lung- decreased breath sounds, unlabored, + raspy breath sounds, wheeze -none , no- cough ,                                    dullness-none, rub- none           Chest wall-  Abd-  Br/ Gen/ Rectal- Not done, not indicated Extrem- cyanosis- none, clubbing, none, atrophy- none, strength- nl Neuro- grossly intact to observation

## 2013-04-08 NOTE — Patient Instructions (Signed)
Please call as needed 

## 2013-04-09 ENCOUNTER — Telehealth: Payer: Self-pay | Admitting: Internal Medicine

## 2013-04-09 NOTE — Progress Notes (Signed)
Quick Note:  Pt aware of results. ______ 

## 2013-04-09 NOTE — Telephone Encounter (Signed)
Result Note     CXR- stable emphysema,no active process      Pt is aware of results.

## 2013-04-09 NOTE — Progress Notes (Signed)
Quick Note:  LMTCB ______ 

## 2013-04-18 DIAGNOSIS — M1710 Unilateral primary osteoarthritis, unspecified knee: Secondary | ICD-10-CM | POA: Diagnosis not present

## 2013-04-18 DIAGNOSIS — Z96659 Presence of unspecified artificial knee joint: Secondary | ICD-10-CM | POA: Diagnosis not present

## 2013-04-29 DIAGNOSIS — F411 Generalized anxiety disorder: Secondary | ICD-10-CM | POA: Diagnosis present

## 2013-04-29 DIAGNOSIS — E785 Hyperlipidemia, unspecified: Secondary | ICD-10-CM | POA: Diagnosis present

## 2013-04-29 DIAGNOSIS — E876 Hypokalemia: Secondary | ICD-10-CM | POA: Diagnosis not present

## 2013-04-29 DIAGNOSIS — F17200 Nicotine dependence, unspecified, uncomplicated: Secondary | ICD-10-CM | POA: Diagnosis not present

## 2013-04-29 DIAGNOSIS — I440 Atrioventricular block, first degree: Secondary | ICD-10-CM | POA: Diagnosis not present

## 2013-04-29 DIAGNOSIS — Z888 Allergy status to other drugs, medicaments and biological substances status: Secondary | ICD-10-CM | POA: Diagnosis not present

## 2013-04-29 DIAGNOSIS — R0789 Other chest pain: Secondary | ICD-10-CM | POA: Diagnosis present

## 2013-04-29 DIAGNOSIS — N200 Calculus of kidney: Secondary | ICD-10-CM | POA: Diagnosis not present

## 2013-04-29 DIAGNOSIS — Z8249 Family history of ischemic heart disease and other diseases of the circulatory system: Secondary | ICD-10-CM | POA: Diagnosis not present

## 2013-04-29 DIAGNOSIS — IMO0002 Reserved for concepts with insufficient information to code with codable children: Secondary | ICD-10-CM | POA: Diagnosis not present

## 2013-04-29 DIAGNOSIS — I2510 Atherosclerotic heart disease of native coronary artery without angina pectoris: Secondary | ICD-10-CM | POA: Diagnosis present

## 2013-04-29 DIAGNOSIS — R1032 Left lower quadrant pain: Secondary | ICD-10-CM | POA: Diagnosis not present

## 2013-04-29 DIAGNOSIS — R079 Chest pain, unspecified: Secondary | ICD-10-CM | POA: Diagnosis not present

## 2013-04-29 DIAGNOSIS — Z8546 Personal history of malignant neoplasm of prostate: Secondary | ICD-10-CM | POA: Diagnosis not present

## 2013-04-29 DIAGNOSIS — J4489 Other specified chronic obstructive pulmonary disease: Secondary | ICD-10-CM | POA: Diagnosis not present

## 2013-04-29 DIAGNOSIS — Z9861 Coronary angioplasty status: Secondary | ICD-10-CM | POA: Diagnosis not present

## 2013-04-29 DIAGNOSIS — F329 Major depressive disorder, single episode, unspecified: Secondary | ICD-10-CM | POA: Diagnosis present

## 2013-04-29 DIAGNOSIS — E871 Hypo-osmolality and hyponatremia: Secondary | ICD-10-CM | POA: Diagnosis not present

## 2013-04-29 DIAGNOSIS — Z880 Allergy status to penicillin: Secondary | ICD-10-CM | POA: Diagnosis not present

## 2013-04-29 DIAGNOSIS — J449 Chronic obstructive pulmonary disease, unspecified: Secondary | ICD-10-CM | POA: Diagnosis not present

## 2013-04-29 DIAGNOSIS — R9431 Abnormal electrocardiogram [ECG] [EKG]: Secondary | ICD-10-CM | POA: Diagnosis not present

## 2013-04-29 DIAGNOSIS — I4460 Unspecified fascicular block: Secondary | ICD-10-CM | POA: Diagnosis not present

## 2013-04-29 DIAGNOSIS — Z79899 Other long term (current) drug therapy: Secondary | ICD-10-CM | POA: Diagnosis not present

## 2013-04-29 DIAGNOSIS — Z7982 Long term (current) use of aspirin: Secondary | ICD-10-CM | POA: Diagnosis not present

## 2013-04-29 DIAGNOSIS — I10 Essential (primary) hypertension: Secondary | ICD-10-CM | POA: Diagnosis not present

## 2013-04-29 DIAGNOSIS — I259 Chronic ischemic heart disease, unspecified: Secondary | ICD-10-CM | POA: Diagnosis not present

## 2013-04-30 ENCOUNTER — Ambulatory Visit: Payer: Medicare Other | Admitting: Cardiology

## 2013-04-30 DIAGNOSIS — R079 Chest pain, unspecified: Secondary | ICD-10-CM | POA: Diagnosis not present

## 2013-04-30 DIAGNOSIS — J0100 Acute maxillary sinusitis, unspecified: Secondary | ICD-10-CM | POA: Insufficient documentation

## 2013-04-30 NOTE — Assessment & Plan Note (Signed)
Emphasized importance of quitting, even though he only admits a few cigarettes daily.

## 2013-04-30 NOTE — Assessment & Plan Note (Signed)
Responding to levaquin from his PCP. Hopefully this will be sufficient

## 2013-04-30 NOTE — Assessment & Plan Note (Signed)
Mild chronic bronchitis and emphysema, uncomplicated.  He is stable for necessary surgery from pulmonary standpoint.

## 2013-05-01 DIAGNOSIS — R079 Chest pain, unspecified: Secondary | ICD-10-CM | POA: Diagnosis not present

## 2013-05-02 DIAGNOSIS — R079 Chest pain, unspecified: Secondary | ICD-10-CM | POA: Diagnosis not present

## 2013-05-02 DIAGNOSIS — E871 Hypo-osmolality and hyponatremia: Secondary | ICD-10-CM | POA: Diagnosis not present

## 2013-05-02 DIAGNOSIS — J449 Chronic obstructive pulmonary disease, unspecified: Secondary | ICD-10-CM | POA: Diagnosis not present

## 2013-05-14 DIAGNOSIS — E039 Hypothyroidism, unspecified: Secondary | ICD-10-CM | POA: Diagnosis not present

## 2013-05-14 DIAGNOSIS — E782 Mixed hyperlipidemia: Secondary | ICD-10-CM | POA: Diagnosis not present

## 2013-05-14 DIAGNOSIS — I10 Essential (primary) hypertension: Secondary | ICD-10-CM | POA: Diagnosis not present

## 2013-05-14 DIAGNOSIS — C61 Malignant neoplasm of prostate: Secondary | ICD-10-CM | POA: Diagnosis not present

## 2013-05-14 DIAGNOSIS — F17200 Nicotine dependence, unspecified, uncomplicated: Secondary | ICD-10-CM | POA: Diagnosis not present

## 2013-05-14 DIAGNOSIS — I708 Atherosclerosis of other arteries: Secondary | ICD-10-CM | POA: Diagnosis not present

## 2013-05-14 DIAGNOSIS — E871 Hypo-osmolality and hyponatremia: Secondary | ICD-10-CM | POA: Diagnosis not present

## 2013-05-28 DIAGNOSIS — J440 Chronic obstructive pulmonary disease with acute lower respiratory infection: Secondary | ICD-10-CM | POA: Diagnosis not present

## 2013-05-28 DIAGNOSIS — I2510 Atherosclerotic heart disease of native coronary artery without angina pectoris: Secondary | ICD-10-CM | POA: Diagnosis not present

## 2013-05-28 DIAGNOSIS — Z7982 Long term (current) use of aspirin: Secondary | ICD-10-CM | POA: Diagnosis not present

## 2013-05-28 DIAGNOSIS — Z885 Allergy status to narcotic agent status: Secondary | ICD-10-CM | POA: Diagnosis not present

## 2013-05-28 DIAGNOSIS — D649 Anemia, unspecified: Secondary | ICD-10-CM | POA: Diagnosis not present

## 2013-05-28 DIAGNOSIS — R079 Chest pain, unspecified: Secondary | ICD-10-CM | POA: Diagnosis not present

## 2013-05-28 DIAGNOSIS — Z9861 Coronary angioplasty status: Secondary | ICD-10-CM | POA: Diagnosis not present

## 2013-05-28 DIAGNOSIS — F17200 Nicotine dependence, unspecified, uncomplicated: Secondary | ICD-10-CM | POA: Diagnosis not present

## 2013-05-28 DIAGNOSIS — Z79899 Other long term (current) drug therapy: Secondary | ICD-10-CM | POA: Diagnosis not present

## 2013-05-28 DIAGNOSIS — IMO0002 Reserved for concepts with insufficient information to code with codable children: Secondary | ICD-10-CM | POA: Diagnosis not present

## 2013-05-28 DIAGNOSIS — Z883 Allergy status to other anti-infective agents status: Secondary | ICD-10-CM | POA: Diagnosis not present

## 2013-05-28 DIAGNOSIS — Z880 Allergy status to penicillin: Secondary | ICD-10-CM | POA: Diagnosis not present

## 2013-05-28 DIAGNOSIS — I10 Essential (primary) hypertension: Secondary | ICD-10-CM | POA: Diagnosis not present

## 2013-05-29 DIAGNOSIS — R079 Chest pain, unspecified: Secondary | ICD-10-CM | POA: Diagnosis not present

## 2013-05-30 ENCOUNTER — Other Ambulatory Visit: Payer: Self-pay | Admitting: Vascular Surgery

## 2013-05-30 DIAGNOSIS — Z48812 Encounter for surgical aftercare following surgery on the circulatory system: Secondary | ICD-10-CM

## 2013-05-30 DIAGNOSIS — I6529 Occlusion and stenosis of unspecified carotid artery: Secondary | ICD-10-CM

## 2013-06-04 DIAGNOSIS — E871 Hypo-osmolality and hyponatremia: Secondary | ICD-10-CM | POA: Diagnosis not present

## 2013-06-04 DIAGNOSIS — F17200 Nicotine dependence, unspecified, uncomplicated: Secondary | ICD-10-CM | POA: Diagnosis not present

## 2013-06-04 DIAGNOSIS — I708 Atherosclerosis of other arteries: Secondary | ICD-10-CM | POA: Diagnosis not present

## 2013-06-04 DIAGNOSIS — I10 Essential (primary) hypertension: Secondary | ICD-10-CM | POA: Diagnosis not present

## 2013-06-04 DIAGNOSIS — R609 Edema, unspecified: Secondary | ICD-10-CM | POA: Diagnosis not present

## 2013-06-04 DIAGNOSIS — R413 Other amnesia: Secondary | ICD-10-CM | POA: Diagnosis not present

## 2013-06-04 DIAGNOSIS — J438 Other emphysema: Secondary | ICD-10-CM | POA: Diagnosis not present

## 2013-06-17 ENCOUNTER — Ambulatory Visit: Payer: Medicare Other | Admitting: Cardiology

## 2013-06-18 ENCOUNTER — Ambulatory Visit (INDEPENDENT_AMBULATORY_CARE_PROVIDER_SITE_OTHER): Payer: Medicare Other | Admitting: Cardiology

## 2013-06-18 ENCOUNTER — Encounter: Payer: Self-pay | Admitting: Cardiology

## 2013-06-18 ENCOUNTER — Encounter: Payer: Self-pay | Admitting: *Deleted

## 2013-06-18 VITALS — BP 137/79 | HR 93 | Ht 66.5 in | Wt 156.4 lb

## 2013-06-18 DIAGNOSIS — I6529 Occlusion and stenosis of unspecified carotid artery: Secondary | ICD-10-CM

## 2013-06-18 DIAGNOSIS — J438 Other emphysema: Secondary | ICD-10-CM

## 2013-06-18 DIAGNOSIS — E785 Hyperlipidemia, unspecified: Secondary | ICD-10-CM | POA: Diagnosis not present

## 2013-06-18 DIAGNOSIS — I2510 Atherosclerotic heart disease of native coronary artery without angina pectoris: Secondary | ICD-10-CM

## 2013-06-18 DIAGNOSIS — Z01810 Encounter for preprocedural cardiovascular examination: Secondary | ICD-10-CM | POA: Diagnosis not present

## 2013-06-18 DIAGNOSIS — J439 Emphysema, unspecified: Secondary | ICD-10-CM

## 2013-06-18 MED ORDER — ISOSORBIDE MONONITRATE ER 60 MG PO TB24
60.0000 mg | ORAL_TABLET | Freq: Two times a day (BID) | ORAL | Status: DC
Start: 2013-06-18 — End: 2013-08-29

## 2013-06-18 NOTE — Assessment & Plan Note (Signed)
History of BMS and DES to the RCA as outlined above, last intervention was in 2005. He has had multiple diagnostic cardiac catheterizations over the years in the setting of chest pain, many times has had stable anatomy, including 2012. Continue medical therapy, increase Imdur to 60 mg twice daily. Followup Lexiscan Cardiolite is also being arranged.

## 2013-06-18 NOTE — Patient Instructions (Signed)
Your physician recommends that you schedule a follow-up appointment in: 1 month. Your physician has recommended you make the following change in your medication: Increase your isosorbide mononitrate to 60 mg twice daily. You may take an additional half tablet this evening and then start with 1 twice daily tomorrow morning. All other medications will remain the same. Your new prescription has been sent to your pharmacy. Your physician has requested that you have a lexiscan myoview. For further information please visit [email or web address]. Please follow instruction sheet, as given.

## 2013-06-18 NOTE — Assessment & Plan Note (Signed)
Patient being considered for elective right knee TKA revision, tentatively in April with Dr. [name]. He does report interval chest pain symptoms and dyspnea exertion within the last 6 weeks or so. Cardiac testing from last June was overall low risk. ECG reviewed above. In light of his history and reported interval symptoms, would recommend a followup Lexiscan Cardiolite to ensure stability and exclude any high risk features that might require further assessment prior to surgery.

## 2013-06-18 NOTE — Assessment & Plan Note (Signed)
Status post right carotid endarterectomy in August 2014, followed by Dr. [name].

## 2013-06-18 NOTE — Progress Notes (Signed)
Clinical Summary Michael [name] is a 80 y.o.male last seen in the office by [name] PA-C in July 2014, a former patient of [name] and subsequently [name]. He is being considered for a right knee TKA revision with Dr. [name] in April 2015.  Cardiac history reviewed in detail. He has undergone multiple cardiac catheterizations, and has had three distinct interventions to the RCA over the years as outlined below. Last cardiac catheterization in August 2012 showed patent stent sites with moderate nonobstructive CAD. Exercise Myoview from June 2014 showed no diagnostic ST segment changes, no clear evidence of scar or ischemia by perfusion data, LVEF 68%.  Right carotid endarterectomy noted in August 2014 to he is followed by Dr. [name].  He is here with his son today. Since January he has had intermittent episodes of chest pain, seems to be fairly sporadic in description, usually at rest rather than with exertion. He did have some shortness of breath with a shower recently which seemed unusual to him. States that he was evaluated at [facility] with at least two of his chest pain episodes, had negative cardiac markers. ECG today shows normal sinus rhythm, prolonged PR interval, R' in lead V1, left anterior fascicular block, poor R wave progression.  We reviewed his medications.   Allergies  Allergen Reactions  . Ciprofloxacin     REACTION: nausea  . Hydrocodone-Acetaminophen   . Iron Itching  . Lyrica [Pregabalin] Other (See Comments)  . Morphine And Related   . Oxycodone     hives  . Penicillins     REACTION: hives    Current Outpatient Prescriptions  Medication Sig Dispense Refill  . albuterol (PROAIR HFA) 108 (90 BASE) MCG/ACT inhaler Inhale 2 puffs into the lungs every 6 (six) hours as needed. For shortness of breath      . amLODipine (NORVASC) 10 MG tablet Take 10 mg by mouth daily.      Marland Kitchen aspirin 81 MG tablet Take 81 mg by mouth daily.        Marland Kitchen atorvastatin (LIPITOR) 40 MG  tablet Take 40 mg by mouth daily.        . cetirizine (ZYRTEC) 10 MG tablet Take 10 mg by mouth daily.       . citalopram (CELEXA) 20 MG tablet Take 20 mg by mouth daily.       . ferrous sulfate 325 (65 FE) MG tablet Take 325 mg by mouth at bedtime.       . Fluticasone-Salmeterol (ADVAIR DISKUS) 250-50 MCG/DOSE AEPB Inhale 1 puff into the lungs every 12 (twelve) hours.        . isosorbide mononitrate (IMDUR) 60 MG 24 hr tablet Take 1 tablet (60 mg total) by mouth 2 (two) times daily.  60 tablet  3  . levothyroxine (SYNTHROID, LEVOTHROID) 125 MCG tablet Take 125 mcg by mouth daily.       . mometasone (NASONEX) 50 MCG/ACT nasal spray Place 2 sprays into the nose daily.  17 g  2  . Multiple Vitamin (MULTIVITAMIN) tablet Take 1 tablet by mouth daily.        . naproxen sodium (ANAPROX) 220 MG tablet Take 440 mg by mouth daily as needed. For pain      . nitroGLYCERIN (NITROSTAT) 0.4 MG SL tablet Place 0.4 mg under the tongue every 5 (five) minutes as needed. For chest pain      . Omega-3 Fatty Acids (FISH OIL) 1200 MG CAPS Take 2 capsules by mouth 2 (  two) times daily.        . phosphorus (K PHOS NEUTRAL) 155-852-130 MG tablet Take 250 mg by mouth 2 (two) times daily.      . ranitidine (ZANTAC) 150 MG tablet Take 150 mg by mouth 2 (two) times daily.       . Tamsulosin HCl (FLOMAX) 0.4 MG CAPS Take 0.4 mg by mouth daily.        . theophylline (THEODUR) 200 MG 12 hr tablet Take 100 mg by mouth 2 (two) times daily.       Marland Kitchen tiotropium (SPIRIVA) 18 MCG inhalation capsule Place 1 capsule (18 mcg total) into inhaler and inhale daily.  90 capsule  2  . traZODone (DESYREL) 100 MG tablet Take 100 mg by mouth at bedtime as needed. For sleep       No current facility-administered medications for this visit.    Past Medical History  Diagnosis Date  . Hyperthyroidism 1978    Status post radioactive iodine treatment  . History of herpes zoster 2001  . Vitreous hemorrhage   . Peripheral arterial disease   .  Mixed hyperlipidemia   . Essential hypertension, benign   . Allergic rhinitis   . COPD (chronic obstructive pulmonary disease)   . History of asbestos exposure   . Depression   . Anemia   . Carotid artery occlusion   . Hypothyroidism   . History of myocardial infarction   . Prostate cancer     Status post XRT/IMRT 2008  . Renal cancer     Cryoablation in 2012  . Coronary atherosclerosis of native coronary artery     BMS RCA 2003, DES RCA 2004, DES RCA 2005   . GERD (gastroesophageal reflux disease)   . Hemorrhoids   . Head injury, closed, with brief LOC   . HOH (hard of hearing)     Past Surgical History  Procedure Laterality Date  . Popliteal synovial cyst excision      knee  . Cholecystectomy    . Cervical disc surgery      fusion  . Partial knee arthroplasty      Right-replacement  . Nasal septum surgery      SMR  . Neck disk fusion  07/2002  . Knee arthroplasty      bilateral  . Endarterectomy  08/30/2011    Procedure: Left ENDARTERECTOMY CAROTID;  Surgeon: Conrad Wrightwood, MD;  Location: [facility];  Service: Vascular;  Laterality: Left;  . Joint replacement Right 2005    Right partial Knee  . Joint replacement Left 2011    Left toatal Knee  . Eye surgery  2010    bilat cataract.2009-Vitreous Hemorrhage-2009  . Endarterectomy Right 12/18/2012    Procedure: ENDARTERECTOMY CAROTID WITH BOVINE PATCH ANGIOPLASTY;  Surgeon: Conrad Caddo, MD;  Location: [place];  Service: Vascular;  Laterality: Right;  . Carotid endarterectomy      Family History  Problem Relation Age of Onset  . Coronary artery disease Brother   . Cancer Brother   . Heart disease Brother   . Heart attack Brother   . Anesthesia problems Neg Hx     Social History [name] reports that he has been smoking Cigarettes.  He has a 6 pack-year smoking history. His smokeless tobacco use includes Chew. [name] reports that he does not drink alcohol.  Review of Systems No palpitations or syncope. No  orthopnea or PND. Chronic knee pain, right worse than left. No recent falls. Stable appetite.  No reported bleeding episodes. Otherwise negative.  Physical Examination Filed Vitals:   06/18/13 1314  BP: 137/79  Pulse: 93   Filed Weights   06/18/13 1314  Weight: 156 lb 6.4 oz (70.943 kg)   Patient appears comfortable at rest, no active chest pain. HEENT: Conjunctiva and lids normal, oropharynx clear. Neck: Supple, no elevated JVP or carotid bruits status post CEA, no thyromegaly. Lungs: Clear to auscultation, nonlabored breathing at rest. Cardiac: Regular rate and rhythm, no S3, soft systolic murmur, no pericardial rub. Abdomen: Soft, nontender, bowel sounds present, no guarding or rebound. Extremities: Trace edema, distal pulses 2+. Skin: Warm and dry. Musculoskeletal: No kyphosis. Neuropsychiatric: Alert and oriented x3, affect grossly appropriate.   Problem List and Plan   Preoperative cardiovascular examination Patient being considered for elective right knee TKA revision, tentatively in April with Dr. [name]. He does report interval chest pain symptoms and dyspnea exertion within the last 6 weeks or so. Cardiac testing from last June was overall low risk. ECG reviewed above. In light of his history and reported interval symptoms, would recommend a followup Lexiscan Cardiolite to ensure stability and exclude any high risk features that might require further assessment prior to surgery.  CAD, NATIVE VESSEL History of BMS and DES to the RCA as outlined above, last intervention was in 2005. He has had multiple diagnostic cardiac catheterizations over the years in the setting of chest pain, many times has had stable anatomy, including 2012. Continue medical therapy, increase Imdur to 60 mg twice daily. Followup Lexiscan Cardiolite is also being arranged.  HYPERLIPIDEMIA-MIXED He continues on Lipitor, followed by Dr. Pleas Koch.  Occlusion and stenosis of carotid artery without mention  of cerebral infarction Status post right carotid endarterectomy in August 2014, followed by Dr. [name].  COPD with emphysema Followed by Dr. Annamaria Boots. He is not on standing beta blocker.    Satira Sark, M.D., F.A.C.C.

## 2013-06-18 NOTE — Assessment & Plan Note (Signed)
He continues on Lipitor, followed by Dr. Pleas Koch.

## 2013-06-18 NOTE — Assessment & Plan Note (Signed)
Followed by Dr. Annamaria Boots. He is not on standing beta blocker.

## 2013-06-27 ENCOUNTER — Encounter (HOSPITAL_COMMUNITY)
Admission: RE | Admit: 2013-06-27 | Discharge: 2013-06-27 | Disposition: A | Discharge status: Home / Self Care | Payer: Medicare Other | Source: Ambulatory Visit | Attending: Cardiology | Admitting: Cardiology

## 2013-06-27 ENCOUNTER — Encounter (HOSPITAL_COMMUNITY): Payer: Self-pay

## 2013-06-27 DIAGNOSIS — R079 Chest pain, unspecified: Secondary | ICD-10-CM | POA: Insufficient documentation

## 2013-06-27 DIAGNOSIS — I2510 Atherosclerotic heart disease of native coronary artery without angina pectoris: Secondary | ICD-10-CM | POA: Diagnosis not present

## 2013-06-27 DIAGNOSIS — Z01810 Encounter for preprocedural cardiovascular examination: Secondary | ICD-10-CM

## 2013-06-27 MED ORDER — REGADENOSON 0.4 MG/5ML IV SOLN
INTRAVENOUS | Status: AC
  Administered 2013-06-27: 0.4 mg via INTRAVENOUS
  Filled 2013-06-27: qty 5

## 2013-06-27 MED ORDER — TECHNETIUM TC 99M SESTAMIBI GENERIC - CARDIOLITE
30.0000 | Freq: Once | INTRAVENOUS | Status: AC | PRN
  Administered 2013-06-27: 30 via INTRAVENOUS

## 2013-06-27 MED ORDER — SODIUM CHLORIDE 0.9 % IJ SOLN
INTRAMUSCULAR | Status: AC
  Filled 2013-06-27: qty 10

## 2013-06-27 MED ORDER — REGADENOSON 0.4 MG/5ML IV SOLN
INTRAVENOUS | Status: AC
  Filled 2013-06-27: qty 5

## 2013-06-27 MED ORDER — TECHNETIUM TC 99M SESTAMIBI - CARDIOLITE
10.0000 | Freq: Once | INTRAVENOUS | Status: AC | PRN
  Administered 2013-06-27: 10 via INTRAVENOUS

## 2013-06-27 MED ORDER — SODIUM CHLORIDE 0.9 % IJ SOLN
INTRAMUSCULAR | Status: AC
  Administered 2013-06-27: 10 mL via INTRAVENOUS
  Filled 2013-06-27: qty 10

## 2013-06-27 NOTE — Progress Notes (Signed)
Stress Lab Nurses Notes - Michael [name]  Michael [name] 06/27/2013 Reason for doing test: CAD, Chest Pain and Surgical Clearance Type of test: Wille Glaser Nurse performing test: Gerrit Halls, RN Nuclear Medicine Tech: Melburn Hake Echo Tech: Not Applicable MD performing test: Koneswaran/K.Lawrence NP Family MD: Burdine Test explained and consent signed: yes IV started: 22g jelco, Saline lock flushed, No redness or edema and Saline lock started in radiology Symptoms: Felt  Full in head and chest Treatment/Intervention: None Reason test stopped: protocol completed After recovery IV was: Discontinued via X-ray tech and No redness or edema Patient to return to [facility]. Med at : 9:30 Patient discharged: Home Patient's Condition upon discharge was: stable Comments: During test BP 123/66 & HR 88.  Recovery BP 110/62 & HR 74.  Symptoms resolved in recovery. Geanie Cooley T

## 2013-06-30 ENCOUNTER — Telehealth: Payer: Self-pay | Admitting: *Deleted

## 2013-06-30 DIAGNOSIS — L259 Unspecified contact dermatitis, unspecified cause: Secondary | ICD-10-CM | POA: Diagnosis not present

## 2013-06-30 DIAGNOSIS — D485 Neoplasm of uncertain behavior of skin: Secondary | ICD-10-CM | POA: Diagnosis not present

## 2013-06-30 DIAGNOSIS — L419 Parapsoriasis, unspecified: Secondary | ICD-10-CM | POA: Diagnosis not present

## 2013-06-30 DIAGNOSIS — B359 Dermatophytosis, unspecified: Secondary | ICD-10-CM | POA: Diagnosis not present

## 2013-06-30 NOTE — Telephone Encounter (Signed)
Message copied by Laurine Blazer on Mon Jun 30, 2013  2:55 PM ------      Message from: Satira Sark      Created: Fri Jun 27, 2013  2:40 PM       Reviewed report. Please let patient know that this study suggests stability in his CAD, specifically no focal ischemia, LVEF is normal at 64%. This would argue that he should be able to proceed with knee surgery, and that we should also continue medical therapy for now. ------

## 2013-06-30 NOTE — Telephone Encounter (Signed)
Notes Recorded by Laurine Blazer, LPN on 09/04/9036 at 3:33 PM Patient notified and verbalized understanding. Copy forwarded to Dr. [name]. Patient states that he has OV with Al[facility] on 07/10/13. Follow up with Dr. Domenic Polite is scheduled for 07/11/2013. Copy of dictation from that visit can be sent afterwards. Patient verbalized understanding.

## 2013-07-03 ENCOUNTER — Encounter: Payer: Self-pay | Admitting: Vascular Surgery

## 2013-07-04 ENCOUNTER — Encounter: Payer: Self-pay | Admitting: Vascular Surgery

## 2013-07-04 ENCOUNTER — Other Ambulatory Visit (HOSPITAL_COMMUNITY): Payer: Medicare Other

## 2013-07-04 ENCOUNTER — Ambulatory Visit: Payer: Medicare Other | Admitting: Vascular Surgery

## 2013-07-04 ENCOUNTER — Ambulatory Visit (INDEPENDENT_AMBULATORY_CARE_PROVIDER_SITE_OTHER): Payer: Medicare Other | Admitting: Vascular Surgery

## 2013-07-04 ENCOUNTER — Ambulatory Visit (HOSPITAL_COMMUNITY)
Admission: RE | Admit: 2013-07-04 | Discharge: 2013-07-04 | Disposition: A | Discharge status: Home / Self Care | Payer: Medicare Other | Source: Ambulatory Visit | Attending: Vascular Surgery | Admitting: Vascular Surgery

## 2013-07-04 VITALS — BP 137/74 | HR 79 | Ht 66.5 in | Wt 160.0 lb

## 2013-07-04 DIAGNOSIS — Z48812 Encounter for surgical aftercare following surgery on the circulatory system: Secondary | ICD-10-CM

## 2013-07-04 DIAGNOSIS — I6529 Occlusion and stenosis of unspecified carotid artery: Secondary | ICD-10-CM | POA: Diagnosis not present

## 2013-07-04 NOTE — Progress Notes (Addendum)
Established Carotid Patient  History of Present Illness  Michael [name] is a 80 y.o. (24-Aug-1931) male s/p R CEA (12/18/12), L CEA (11/25/10) who presents with chief complaint: routine follow-up and clearance for R TKR.  Previous carotid studies demonstrated: RICA >23% stenosis, LICA >76% stenosis.  Patient has no history of TIA or stroke symptom.  The patient has never had amaurosis fugax or monocular blindness.  The patient has never had facial drooping or hemiplegia.  The patient has never had receptive or expressive aphasia.  This patient underwent CEA for severe calcified B carotid stenosis.  The patient notes he is undergoing R TKR and needs clearance to proceed.  The patient's PMH, PSH, SH, FamHx, Med, and Allergies are unchanged from 01/03/13.  On ROS today: R knee pain and swelling, no CVA or TIA sx  Physical Examination  Filed Vitals:   07/04/13 1343 07/04/13 1346  BP: 141/64 137/74  Pulse: 79   Height: 5' 6.5" (1.689 m)   Weight: 160 lb (72.576 kg)   SpO2: 97%    Body mass index is 25.44 kg/(m^2).  General: A&O x 3, WD, thin, limping  Eyes: PERRLA, EOMI  Neck: Supple, no nuchal rigidity, no palpable LAD  Pulmonary: Sym exp, good air movt, CTAB, no rales, rhonchi, & wheezing  Cardiac: RRR, Nl S1, S2, no Murmurs, rubs or gallops  Vascular: Vessel Right Left  Radial Palpable Palpable  Brachial Palpable Palpable  Carotid Palpable, without bruit Palpable, without bruit  Aorta Not palpable N/A  Femoral Palpable Palpable  Popliteal Not palpable Not palpable  PT Palpable Palpable  DP Palpable Palpable   Gastrointestinal: soft, NTND, -G/R, - HSM, - masses, - CVAT B  Musculoskeletal: M/S 5/5 throughout , limp in gait, Extremities without ischemic changes   Neurologic: CN 2-12 intact , Pain and light touch intact in extremities , Motor exam as listed above  Non-Invasive Vascular Imaging  CAROTID DUPLEX (Date: 07/04/2013):   R ICA stenosis: patent CEA  R CCA  161/19 c/s  R VA: patent and antegrade  L ICA stenosis: patent CEA  L VA:  patent and antegrade  Medical Decision Making  Michael [name] is a 80 y.o. male who presents with: s/p B CEA , asx R CCA stenosis (<50%)   From a vascular surgical viewpoint, this patient is clear to proceed with his R knee surgery.  Based on the patient's vascular studies and examination, I have offered the patient: repeat B carotid duplex in 6 months for VQI purposes then annually.  I discussed in depth with the patient the nature of atherosclerosis, and emphasized the importance of maximal medical management including strict control of blood pressure, blood glucose, and lipid levels, antiplatelet agents, obtaining regular exercise, and cessation of smoking.    The patient is aware that without maximal medical management the underlying atherosclerotic disease process will progress, limiting the benefit of any interventions. The patient is currently on a statin: Lipitor The patient is currently on an anti-platelet: ASA  Thank you for allowing [place] to participate in this patient's care.  Adele Barthel, MD [facility] Office: [phone] Pager: [phone]  07/04/2013, 2:02 PM  VASCULAR QUALITY INITIATIVE FOLLOW UP DATA:  Current smoker: [  ] yes  [x  ] no  Living status: [ x ]  Home  [  ] Nursing home  [  ] Homeless    MEDS:  ASA [x  ] yes  [  ] no- [  ]  medical reason  [  ] non compliant  STATIN  [  x] yes  [  ] no- [  ] medical reason  [  ] non compliant  Beta blocker [  ] yes  [ x ] no- [  ] medical reason  [  ] non compliant  ACE inhibitor [  ] yes  x[  ] no- [  ] medical reason  [  ] non compliant  P2Y12 Antagonist [ x ] none  [  ] clopidogrel-Plavix  [  ] ticlopidine-Ticlid   [  ] prasugrel-Effient  [  ] ticagrelor- Brilinta    Anticoagulant [ x ] None  [  ] warfarin  [  ] rivaroxaban-Xarelto [  ] dabigatran- Pradaxa  Neurologic event since D/C:  [ x ] no   [  ] yes: [  ] eye event  [  ] cortical event  [  ] VB event  [  ] non specific event  [  ] right  [  ] left  [  ] TIA  [  ] stroke  Date:   Modified Rankin Score: 0  MI since D/C: [ x ] no  [  ] troponin only  [  ] EKG or clinical  Cranial nerve injury: [x  ] none  [  ] resolved  [  ] persistent  Duplex CEA site: [  ] no  [ x ] yes - PSV= 99  EDV= 20  ICA/CCA ratio: N/A  Stenosis= [ x ] <40% [  ] 40-59% [  ] 60-79%  [  ] > 80%  [  ]  Occluded  CEA site re-operation:  [ x ] no   [  ] yes- date of re-op:  CEA site PCI:   [ x ] no   [  ] yes- date of PCI:

## 2013-07-07 ENCOUNTER — Telehealth: Payer: Self-pay | Admitting: Cardiology

## 2013-07-07 NOTE — Addendum Note (Signed)
Addended by: Thresa Ross C on: 07/07/2013 01:55 PM   Modules accepted: Orders

## 2013-07-07 NOTE — Telephone Encounter (Signed)
Michael [name] with Dr. Lizbeth Bark office called stating that Dr. Pleas Koch needs to know if Mr. Cupples is cleared for knee surgery. Would like last office note and a copy of stress test. Please call Michael [name] at [phone].

## 2013-07-07 NOTE — Telephone Encounter (Signed)
Called staff back at [facility] office but no answer. Requested information faxed to Dr. Lizbeth Bark office.

## 2013-07-10 ENCOUNTER — Other Ambulatory Visit: Payer: Self-pay | Admitting: Orthopedic Surgery

## 2013-07-11 ENCOUNTER — Ambulatory Visit (INDEPENDENT_AMBULATORY_CARE_PROVIDER_SITE_OTHER): Payer: Medicare Other | Admitting: Cardiology

## 2013-07-11 ENCOUNTER — Encounter: Payer: Self-pay | Admitting: Cardiology

## 2013-07-11 VITALS — BP 134/72 | HR 94 | Ht 72.0 in | Wt 158.0 lb

## 2013-07-11 DIAGNOSIS — I6529 Occlusion and stenosis of unspecified carotid artery: Secondary | ICD-10-CM

## 2013-07-11 DIAGNOSIS — I658 Occlusion and stenosis of other precerebral arteries: Secondary | ICD-10-CM

## 2013-07-11 DIAGNOSIS — I2510 Atherosclerotic heart disease of native coronary artery without angina pectoris: Secondary | ICD-10-CM

## 2013-07-11 DIAGNOSIS — J439 Emphysema, unspecified: Secondary | ICD-10-CM

## 2013-07-11 DIAGNOSIS — Z01810 Encounter for preprocedural cardiovascular examination: Secondary | ICD-10-CM

## 2013-07-11 DIAGNOSIS — J438 Other emphysema: Secondary | ICD-10-CM

## 2013-07-11 DIAGNOSIS — I6523 Occlusion and stenosis of bilateral carotid arteries: Secondary | ICD-10-CM

## 2013-07-11 NOTE — Assessment & Plan Note (Signed)
Patient not on blocker.

## 2013-07-11 NOTE — Assessment & Plan Note (Signed)
Recent followup with Dr. [name] noted, patent bilateral ICA CEA sites.

## 2013-07-11 NOTE — Progress Notes (Signed)
Clinical Summary Michael [name] is a 80 y.o.male just recently seen in the office in February for a preoperative consultation. He is scheduled for a right TKA revision with Dr. Darryl Lent on April 8. He reports no escalating chest pain symptoms. We did increase his Imdur at the last visit.  Lexiscan Cardiolite performed in February of this year showed no evidence of scar or ischemia with LVEF 64%. I reviewed this with the patient and his daughter today. This test would argue against any major RCA restenosis or new obstructive lesions.  Followup visit noted with Dr. [name] this month showing patent bilateral ICA CEA sites.   Allergies  Allergen Reactions  . Ciprofloxacin     REACTION: nausea  . Hydrocodone-Acetaminophen   . Iron Itching  . Lyrica [Pregabalin] Other (See Comments)  . Morphine And Related   . Oxycodone     hives  . Penicillins     REACTION: hives    Current Outpatient Prescriptions  Medication Sig Dispense Refill  . albuterol (PROAIR HFA) 108 (90 BASE) MCG/ACT inhaler Inhale 2 puffs into the lungs every 6 (six) hours as needed. For shortness of breath      . amLODipine (NORVASC) 10 MG tablet Take 10 mg by mouth daily.      Marland Kitchen aspirin 81 MG tablet Take 81 mg by mouth daily.        Marland Kitchen atorvastatin (LIPITOR) 40 MG tablet Take 40 mg by mouth daily.        . cetirizine (ZYRTEC) 10 MG tablet Take 10 mg by mouth daily.       . citalopram (CELEXA) 20 MG tablet Take 20 mg by mouth daily.       . ferrous sulfate 325 (65 FE) MG tablet Take 325 mg by mouth at bedtime.       . Fluticasone-Salmeterol (ADVAIR DISKUS) 250-50 MCG/DOSE AEPB Inhale 1 puff into the lungs every 12 (twelve) hours.        . isosorbide mononitrate (IMDUR) 60 MG 24 hr tablet Take 1 tablet (60 mg total) by mouth 2 (two) times daily.  60 tablet  3  . levothyroxine (SYNTHROID, LEVOTHROID) 125 MCG tablet Take 125 mcg by mouth daily.       . mometasone (NASONEX) 50 MCG/ACT nasal spray Place 2 sprays into the nose daily.   17 g  2  . Multiple Vitamin (MULTIVITAMIN) tablet Take 1 tablet by mouth daily.        . nitroGLYCERIN (NITROSTAT) 0.4 MG SL tablet Place 0.4 mg under the tongue every 5 (five) minutes as needed. For chest pain      . Omega-3 Fatty Acids (FISH OIL) 1200 MG CAPS Take 2 capsules by mouth 2 (two) times daily.        . ranitidine (ZANTAC) 150 MG tablet Take 150 mg by mouth 2 (two) times daily.       . Tamsulosin HCl (FLOMAX) 0.4 MG CAPS Take 0.4 mg by mouth daily.        . theophylline (THEODUR) 200 MG 12 hr tablet Take 100 mg by mouth 2 (two) times daily.       Marland Kitchen tiotropium (SPIRIVA) 18 MCG inhalation capsule Place 1 capsule (18 mcg total) into inhaler and inhale daily.  90 capsule  2  . traZODone (DESYREL) 100 MG tablet Take 100 mg by mouth at bedtime as needed. For sleep       No current facility-administered medications for this visit.    Past Medical History  Diagnosis Date  . Hyperthyroidism 1978    Status post radioactive iodine treatment  . History of herpes zoster 2001  . Vitreous hemorrhage   . Peripheral arterial disease   . Mixed hyperlipidemia   . Essential hypertension, benign   . Allergic rhinitis   . COPD (chronic obstructive pulmonary disease)   . History of asbestos exposure   . Depression   . Anemia   . Carotid artery occlusion   . Hypothyroidism   . History of myocardial infarction   . Prostate cancer     Status post XRT/IMRT 2008  . Renal cancer     Cryoablation in 2012  . Coronary atherosclerosis of native coronary artery     BMS RCA 2003, DES RCA 2004, DES RCA 2005   . GERD (gastroesophageal reflux disease)   . Hemorrhoids   . Head injury, closed, with brief LOC   . HOH (hard of hearing)     Past Surgical History  Procedure Laterality Date  . Popliteal synovial cyst excision      knee  . Cholecystectomy    . Cervical disc surgery      fusion  . Partial knee arthroplasty      Right-replacement  . Nasal septum surgery      SMR  . Neck disk fusion   07/2002  . Knee arthroplasty      bilateral  . Endarterectomy  08/30/2011    Procedure: Left ENDARTERECTOMY CAROTID;  Surgeon: Conrad [place] Wildwood, MD;  Location: [place];  Service: Vascular;  Laterality: Left;  . Joint replacement Right 2005    Right partial Knee  . Joint replacement Left 2011    Left toatal Knee  . Eye surgery  2010    bilat cataract.2009-Vitreous Hemorrhage-2009  . Endarterectomy Right 12/18/2012    Procedure: ENDARTERECTOMY CAROTID WITH BOVINE PATCH ANGIOPLASTY;  Surgeon: Conrad Leetonia, MD;  Location: [place];  Service: Vascular;  Laterality: Right;  . Carotid endarterectomy      Family History  Problem Relation Age of Onset  . Coronary artery disease Brother   . Cancer Brother   . Heart disease Brother   . Heart attack Brother   . Anesthesia problems Neg Hx     Social History Mr. Haycraft reports that he has been smoking Cigarettes.  He has a 6 pack-year smoking history. His smokeless tobacco use includes Chew. Mr. Boomer reports that he does not drink alcohol.  Review of Systems Chronic knee pain. No palpitations or syncope. Otherwise negative except as outlined.  Physical Examination Filed Vitals:   07/11/13 1341  BP: 134/72  Pulse: 94   Filed Weights   07/11/13 1341  Weight: 158 lb (71.668 kg)    Patient appears comfortable at rest, no active chest pain.  HEENT: Conjunctiva and lids normal, oropharynx clear.  Neck: Supple, no elevated JVP or carotid bruits status post CEA, no thyromegaly.  Lungs: Clear to auscultation, nonlabored breathing at rest.  Cardiac: Regular rate and rhythm, no S3, soft systolic murmur, no pericardial rub.  Abdomen: Soft, nontender, bowel sounds present, no guarding or rebound.  Extremities: Trace edema, distal pulses 2+.  Skin: Warm and dry.  Musculoskeletal: No kyphosis.  Neuropsychiatric: Alert and oriented x3, affect grossly appropriate.   Problem List and Plan   Preoperative cardiovascular examination The patient is  clinically stable at the present time with known CAD as detailed above on medical therapy. Recent followup Lexiscan Cardiolite shows no active ischemia or scar with LVEF 64%. I  reviewed this with the patient and his daughter. He should be able to go ahead with planned right TKA revision per Dr. [name] at an acceptable perioperative cardiac risk. If the need arises, our inpatient consultation team can see him while he is hospitalized.  CAD, NATIVE VESSEL History of BMS and DES to the RCA as outlined above, last intervention was in 2005.  Carotid stenosis, bilateral Recent followup with Dr. [name] noted, patent bilateral ICA CEA sites.  COPD with emphysema Patient not on blocker.    Satira Sark, M.D., F.A.C.C.

## 2013-07-11 NOTE — Assessment & Plan Note (Signed)
The patient is clinically stable at the present time with known CAD as detailed above on medical therapy. Recent followup Lexiscan Cardiolite shows no active ischemia or scar with LVEF 64%. I reviewed this with the patient and his daughter. He should be able to go ahead with planned right TKA revision per Dr. [name] at an acceptable perioperative cardiac risk. If the need arises, our inpatient consultation team can see him while he is hospitalized.

## 2013-07-11 NOTE — Patient Instructions (Signed)
Continue all current medications. Follow up in  3 months 

## 2013-07-11 NOTE — Assessment & Plan Note (Signed)
History of BMS and DES to the RCA as outlined above, last intervention was in 2005.

## 2013-07-17 ENCOUNTER — Other Ambulatory Visit: Payer: Self-pay | Admitting: Orthopedic Surgery

## 2013-07-17 NOTE — H&P (Signed)
Michael [name] DOB: 1932/03/31 Widowed / Language: English / Race: White Male  Date of Admission:  08-06-2013  Chief Complaint:  Failed Right Uni Knee Replacement  History of Present Illness The patient is a 80 year old male who comes in for a preoperative History and Physical. The patient is scheduled for a right total knee arthroplasty (conversion of right uni knee to total knee) to be performed by Dr. Dione Plover. Aluisio, MD at [facility] on 08-06-2013. The patient is a 80 year old male who presents for follow up of their knee. The patient is being followed for their right knee pain and s/p total uni knee 9 years ago. Symptoms reported today include: pain (medial side, unable to go up steps due to pain, worse if twists knee) and swelling (medial side). The patient feels that they are doing poorly. The patient has reported improvement of their symptoms with: Cortisone injections (last one done 08/2012). The patient indicates that they have questions or concerns today regarding pain and their progress at this point (options for pain relief). Michael [name] has had worsening problems now with the right knee. We had previously replaced his left knee and he has done well with that. He had a right knee unicompartment replacement many years ago and is having increasing difficulty. I gave him a cortisone injection back in the spring and he did do well with that for about a month or so. Unfortunately, the pain has come back as bad as ever. He is at a stage now where he is tired of dealing with this and wants to do what it will take to get this better. He is ready to proceed with converting his previous Uni Knee over to a Total Knee Replacement. They have been treated conservatively in the past for the above stated problem and despite conservative measures, they continue to have progressive pain and severe functional limitations and dysfunction. They have failed non-operative management including  home exercise, medications, and injections. It is felt that they would benefit from undergoing conversion to a total joint replacement. Risks and benefits of the procedure have been discussed with the patient and they elect to proceed with surgery. There are no active contraindications to surgery such as ongoing infection or rapidly progressive neurological disease.  Allergies NU-IRON. 11/26/2006 Itching. CIPRO. 11/26/2006 OXYCODONE. 11/26/2006 Hives. PENICILLIN. 11/26/2006 Hives. Hydrocodone-Acetaminophen *ANALGESICS - OPIOID*. Confusion, Rash   Problem List/Past MedicalHistory S/P right unicompartmental knee replacement (V43.65) Lumbar disc degeneration (722.52) Lumbar pain (724.2) Cancer Hypothyroidism Chronic Obstructive Lung Disease Osteoarthritis Prostate Disease Diverticulitis Of Colon Coronary artery disease High blood pressure Prostate Cancer Hypercholesterolemia Carotid Arterial Disease Shingles. November 2001 Impaired Hearing Depression Coronary Artery Disease/Heart Disease Gastroesophageal Reflux Disease Degenerative Disc Disease   Family History Heart Disease. Brother. brother Cancer. grandmother mothers side Hypertension. grandmother mothers side   Social History Tobacco / smoke exposure. no Number of flights of stairs before winded. 2-3 Children. 5 or more Alcohol use. never consumed alcohol current drinker; only occasionally per week Tobacco use. Current every day smoker. current every day smoker; smoke(d) less than 1/2 pack(s) per day former smoker Living situation. live alone Illicit drug use. no Pain Contract. no Marital status. widowed Drug/Alcohol Rehab (Currently). no Current work status. retired Exercise. Exercises monthly; does other Exercises daily; does other Drug/Alcohol Rehab (Previously). no Advance Directives. Living Will, Healthcare POA   Medication History Ranitidine HCl (150MG  Capsule, 1 Oral two  times daily) Active. AmLODIPine Besylate (10MG  Tablet, 1 Oral daily) Active.  CeleXA (20MG  Tablet, 1 Oral daily) Active. Levothyroxine Sodium (125MCG Tablet, 1 Oral daily) Active. Aspirin (81MG  Tablet, 1 (one) Oral) Active. Ferrous Sulfate (325 (65 Fe)MG Tablet, 1 Oral daily) Active. Advair Diskus (250-50MCG/DOSE Aero Pow Br Act, 1 Inhalation two times daily) Active. Lipitor (40MG  Tablet, 1 Oral daily) Active. Flomax (0.4MG  Capsule, 1 Oral daily) Active. TraZODone HCl (100MG  Tablet, 1 Oral as needed) Active. Isosorbide Mononitrate ER (60MG  Tablet ER 24HR, 1.5 Oral daily) Active. Omega 3 (1200MG  Capsule, 2 Oral two times daily) Active. Multiple Vitamin (1 (one) Oral daily) Active. Nitroglycerin (0.4MG  Tab Sublingual, Sublingual as needed) Active. Nasonex (50MCG/ACT Suspension, 2 Nasal at bedtime) Active. Spiriva HandiHaler (18MCG Capsule, 1 Inhalation daily) Active. ProAir HFA (108 (90 Base)MCG/ACT Aerosol Soln, 1-2 Inhalation every 4-6 hours, as needed) Active. ZyrTEC Allergy (10MG  Capsule, 1 Oral daily) Active. Theophylline ER (200MG  Tablet ER 12HR, 0.5 Oral two times daily) Active.   Past Surgical History Heart Stents. 3 total stents (one stent is DES) Vasectomy Total Knee Replacement. Date: 11/2009. left, Dr. Adriana Mccallum Neck Disc Surgery. Date: 07/2002. Fusion Gallbladder Surgery. Date: 10/1993. laporoscopic Cataract Surgery. Date: 2010. bilateral Thyroidectomy; Total Right Unicompartmental Replacement. Date: 10/2003. Cardiac Catheterization. August 2003, August 2005, October 2005, June 2006, July 2009, October 2010, August 2012 Left Carotid Endarterectomy. Date: 08/2011. Right Carotid Endarterectomy. Date: 11/2012.   Review of Systems General:Not Present- Chills, Fever, Night Sweats, Fatigue, Weight Gain, Weight Loss and Memory Loss. Skin:Not Present- Hives, Itching, Rash, Eczema and Lesions. HEENT:Not Present- Tinnitus, Headache, Double Vision, Visual Loss, Hearing  Loss and Dentures. Respiratory:Present- Cough. Not Present- Shortness of breath with exertion, Shortness of breath at rest, Allergies, Coughing up blood and Chronic Cough. Cardiovascular:Not Present- Chest Pain, Racing/skipping heartbeats, Difficulty Breathing Lying Down, Murmur, Swelling and Palpitations. Gastrointestinal:Not Present- Bloody Stool, Heartburn, Abdominal Pain, Vomiting, Nausea, Constipation, Diarrhea, Difficulty Swallowing, Jaundice and Loss of appetitie. Male Genitourinary:Not Present- Urinary frequency, Blood in Urine, Weak urinary stream, Discharge, Flank Pain, Incontinence, Painful Urination, Urgency, Urinary Retention and Urinating at Night. Musculoskeletal:Present- Morning Stiffness. Not Present- Muscle Weakness, Muscle Pain, Joint Swelling, Joint Pain, Back Pain and Spasms. Neurological:Not Present- Tremor, Dizziness, Blackout spells, Paralysis, Difficulty with balance and Weakness. Psychiatric:Not Present- Insomnia.    Vitals Weight: 162 lb Height: 68 in Weight was reported by patient. Height was reported by patient. Body Surface Area: 1.88 m Body Mass Index: 24.63 kg/m Pulse: 76 (Regular) Resp.: 20 (Unlabored) BP: 112/60 (Sitting, Left Arm, Standard)     Physical Exam The physical exam findings are as follows:   General Mental Status - Alert, cooperative and good historian. General Appearance- pleasant. Not in acute distress. Orientation- Oriented X3. Build & Nutrition- Well nourished and Well developed.   Head and Neck Head- normocephalic, atraumatic . Neck Global Assessment- supple. no bruit auscultated on the right and no bruit auscultated on the left.   Eye Vision- Wears corrective lenses (part of the time). Pupil- Bilateral- Regular and Round. Motion- Bilateral- EOMI. ENMT full upper dentures and partial lower  Chest and Lung Exam Auscultation: Breath sounds:- clear at anterior chest  wall. Adventitious sounds:Expiratory wheeze- Right Lower Lobe (Posterior) (clears with deep cough on exam).   Cardiovascular Auscultation:Rhythm- Regular rate and rhythm. Heart Sounds- S1 WNL and S2 WNL. Murmurs & Other Heart Sounds:Auscultation of the heart reveals - No Murmurs.   Abdomen Palpation/Percussion:Tenderness- Abdomen is non-tender to palpation. Rigidity (guarding)- Abdomen is soft. Auscultation:Auscultation of the abdomen reveals - Bowel sounds normal.   Male Genitourinary Not done, not pertinent to present  illness  Musculoskeletal On exam, well developed male alert and oriented in no apparent distress. Evaluation of his right knee shows no effusion. There is no deformity. There is marked crepitus on range of motion of the knee. Range is about 5 to 120. There is no instability noted. He is very tender medially. His left knee looks great. Range is about 0-125. No tenderness or instability noted on the left.  RADIOGRAPHS: AP both knees and lateral are reviewed and show a unicompartmental replacement on the right side with significant lateral and patellofemoral arthritis.   Assessment & Plan S/P right unicompartmental knee replacement (V43.65)  Note: Plan is for a Conversion of right unicompartmental replacement to a Right Total Knee Replacement by Dr. [name].  Plan is to go [place] surgery.  PCP - Dr. Dalia Heading, [facility] - Dr. Johnny Bridge, [facility]  The patient will not receive TXA (tranexamic acid) due to: Coronary and Carotid Arterial Disease  Signed electronically by Joelene Millin, III PA-C

## 2013-07-23 DIAGNOSIS — L408 Other psoriasis: Secondary | ICD-10-CM | POA: Diagnosis not present

## 2013-07-25 ENCOUNTER — Encounter (HOSPITAL_COMMUNITY): Payer: Self-pay | Admitting: Pharmacy Technician

## 2013-07-30 ENCOUNTER — Encounter (HOSPITAL_COMMUNITY): Payer: Self-pay

## 2013-07-30 ENCOUNTER — Encounter (HOSPITAL_COMMUNITY)
Admission: RE | Admit: 2013-07-30 | Discharge: 2013-07-30 | Disposition: A | Discharge status: Home / Self Care | Payer: Medicare Other | Source: Ambulatory Visit | Attending: Orthopedic Surgery | Admitting: Orthopedic Surgery

## 2013-07-30 DIAGNOSIS — Z01812 Encounter for preprocedural laboratory examination: Secondary | ICD-10-CM | POA: Insufficient documentation

## 2013-07-30 HISTORY — DX: Low back pain: M54.5

## 2013-07-30 HISTORY — DX: Psoriasis, unspecified: L40.9

## 2013-07-30 HISTORY — DX: Pneumonia, unspecified organism: J18.9

## 2013-07-30 HISTORY — DX: Low back pain, unspecified: M54.50

## 2013-07-30 LAB — CBC
HEMATOCRIT: 37.8 % — AB (ref 39.0–52.0)
Hemoglobin: 13 g/dL (ref 13.0–17.0)
MCH: 32.7 pg (ref 26.0–34.0)
MCHC: 34.4 g/dL (ref 30.0–36.0)
MCV: 95 fL (ref 78.0–100.0)
Platelets: 221 10*3/uL (ref 150–400)
RBC: 3.98 MIL/uL — ABNORMAL LOW (ref 4.22–5.81)
RDW: 14.4 % (ref 11.5–15.5)
WBC: 8.5 10*3/uL (ref 4.0–10.5)

## 2013-07-30 LAB — COMPREHENSIVE METABOLIC PANEL
ALBUMIN: 3.3 g/dL — AB (ref 3.5–5.2)
ALT: 10 U/L (ref 0–53)
AST: 16 U/L (ref 0–37)
Alkaline Phosphatase: 73 U/L (ref 39–117)
BUN: 12 mg/dL (ref 6–23)
CO2: 26 mEq/L (ref 19–32)
CREATININE: 1.19 mg/dL (ref 0.50–1.35)
Calcium: 9.5 mg/dL (ref 8.4–10.5)
Chloride: 98 mEq/L (ref 96–112)
GFR calc non Af Amer: 56 mL/min — ABNORMAL LOW (ref >90)
GFR, EST AFRICAN AMERICAN: 65 mL/min — AB (ref >90)
GLUCOSE: 104 mg/dL — AB (ref 70–99)
Potassium: 4.4 mEq/L (ref 3.7–5.3)
Sodium: 134 mEq/L — ABNORMAL LOW (ref 137–147)
TOTAL PROTEIN: 6.6 g/dL (ref 6.0–8.3)
Total Bilirubin: 0.3 mg/dL (ref 0.3–1.2)

## 2013-07-30 LAB — URINALYSIS, ROUTINE W REFLEX MICROSCOPIC
BILIRUBIN URINE: NEGATIVE
GLUCOSE, UA: NEGATIVE mg/dL
HGB URINE DIPSTICK: NEGATIVE
Ketones, ur: NEGATIVE mg/dL
Leukocytes, UA: NEGATIVE
Nitrite: NEGATIVE
PH: 6.5 (ref 5.0–8.0)
Protein, ur: NEGATIVE mg/dL
SPECIFIC GRAVITY, URINE: 1.015 (ref 1.005–1.030)
UROBILINOGEN UA: 0.2 mg/dL (ref 0.0–1.0)

## 2013-07-30 LAB — PROTIME-INR
INR: 0.97 (ref 0.00–1.49)
Prothrombin Time: 12.7 seconds (ref 11.6–15.2)

## 2013-07-30 LAB — APTT: aPTT: 38 seconds — ABNORMAL HIGH (ref 24–37)

## 2013-07-30 LAB — SURGICAL PCR SCREEN
MRSA, PCR: NEGATIVE
STAPHYLOCOCCUS AUREUS: NEGATIVE

## 2013-07-30 NOTE — Pre-Procedure Instructions (Addendum)
EKG REPORT 06-18-13, NUCLEAR STRESS TEST REPORT 06/27/13, CARDIOLOGY OFFICE NOTE WITH CARDIAC CLEARANCE FOR KNEE REPLACEMENT DR. MCDOWELL 07/11/12 IN EPIC. CXR REPORT IN EPIC FROM 04-02-13.  DR. Tarri Fuller YOUNG SENT PULMONARY CLEARANCE, LAST OFFICE NOTE 04/08/13, LAST PULMONARY FUNCTION TEST 06/10/12 - REPORTS ON CHART.

## 2013-07-30 NOTE — Patient Instructions (Addendum)
   YOUR SURGERY IS SCHEDULED AT [facility]  ON:  Wednesday  4/8  REPORT TO  SHORT STAY CENTER AT:  6:30 AM      PHONE # FOR SHORT STAY IS [phone]  DO NOT EAT OR DRINK ANYTHING AFTER MIDNIGHT THE NIGHT BEFORE YOUR SURGERY.  YOU MAY BRUSH YOUR TEETH, RINSE OUT YOUR MOUTH--BUT NO WATER, NO FOOD, NO CHEWING GUM, NO MINTS, NO CANDIES, NO CHEWING TOBACCO.  PLEASE TAKE THE FOLLOWING MEDICATIONS THE AM OF YOUR SURGERY WITH A FEW SIPS OF WATER:  AMLODIPINE, ZYRTEC, CELEXA, ISOSORBIDE, LEVOTHYROXINE, RANITIDINE, THEOPHYLLINE.   USE YOUR ALBUTEROL, ADVAIR, AND SPIRIVA INHALERS AND BRING YOUR ALBUTEROL INHALER. BRING YOUR NITROGLYCERIN.  I DO NOT BRING VALUABLES, MONEY, CREDIT CARDS.  DO NOT WEAR JEWELRY, MAKE-UP, NAIL POLISH AND NO METAL PINS OR CLIPS IN YOUR HAIR. CONTACT LENS, DENTURES / PARTIALS, GLASSES SHOULD NOT BE WORN TO SURGERY AND IN MOST CASES-HEARING AIDS WILL NEED TO BE REMOVED.  BRING YOUR GLASSES CASE, ANY EQUIPMENT NEEDED FOR YOUR CONTACT LENS. FOR PATIENTS ADMITTED TO THE HOSPITAL--CHECK OUT TIME THE DAY OF DISCHARGE IS 11:00 AM.  ALL INPATIENT ROOMS ARE PRIVATE - WITH BATHROOM, TELEPHONE, TELEVISION AND WIFI INTERNET.                                                    PLEASE READ OVER ANY  FACT SHEETS THAT YOU WERE GIVEN: MRSA INFORMATION, BLOOD TRANSFUSION INFORMATION, INCENTIVE SPIROMETER INFORMATION.  FAILURE TO FOLLOW THESE INSTRUCTIONS MAY RESULT IN THE CANCELLATION OF YOUR SURGERY. PLEASE BE AWARE THAT YOU MAY NEED ADDITIONAL BLOOD DRAWN DAY OF YOUR SURGERY  PATIENT SIGNATURE_________________________________

## 2013-07-30 NOTE — Progress Notes (Signed)
07/30/13 1200  OBSTRUCTIVE SLEEP APNEA  Have you ever been diagnosed with sleep apnea through a sleep study? No  Do you snore loudly (loud enough to be heard through closed doors)?  0  Do you often feel tired, fatigued, or sleepy during the daytime? 1  Has anyone observed you stop breathing during your sleep? 1  Do you have, or are you being treated for high blood pressure? 1  BMI more than 35 kg/m2? 0  Age over 78 years old? 1  Neck circumference greater than 40 cm/18 inches? 0  Gender: 1  Obstructive Sleep Apnea Score 5  Score 4 or greater  Results sent to PCP

## 2013-08-04 ENCOUNTER — Ambulatory Visit: Payer: Medicare Other | Admitting: Cardiology

## 2013-08-05 ENCOUNTER — Encounter (HOSPITAL_COMMUNITY): Payer: Self-pay | Admitting: Anesthesiology

## 2013-08-05 NOTE — Anesthesia Preprocedure Evaluation (Addendum)
Anesthesia Evaluation  Patient identified by MRN, date of birth, ID band Patient awake    Reviewed: Allergy & Precautions, H&P , NPO status , Patient's Chart, lab work & pertinent test results  Airway Mallampati: II TM Distance: >3 FB Neck ROM: Full    Dental no notable dental hx.    Pulmonary shortness of breath, pneumonia -, resolved, COPD COPD inhaler, Current Smoker,  breath sounds clear to auscultation  Pulmonary exam normal       Cardiovascular hypertension, Pt. on medications + CAD and + Peripheral Vascular Disease Rhythm:Regular Rate:Normal  Preoperative cardiology visit with Dr. Domenic Polite 07-11-13. Clearance. Recent cardiolite with no active ischemia and LVEF 64%.   Neuro/Psych PSYCHIATRIC DISORDERS Depression negative neurological ROS     GI/Hepatic Neg liver ROS, GERD-  Medicated,  Endo/Other  Hypothyroidism Hyperthyroidism   Renal/GU Renal disease  negative genitourinary   Musculoskeletal negative musculoskeletal ROS (+)   Abdominal   Peds negative pediatric ROS (+)  Hematology negative hematology ROS (+) anemia ,   Anesthesia Other Findings   Reproductive/Obstetrics negative OB ROS                        Anesthesia Physical Anesthesia Plan  ASA: III  Anesthesia Plan: Spinal   Post-op Pain Management:    Induction: Intravenous  Airway Management Planned:   Additional Equipment:   Intra-op Plan:   Post-operative Plan: Extubation in OR  Informed Consent: I have reviewed the patients History and Physical, chart, labs and discussed the procedure including the risks, benefits and alternatives for the proposed anesthesia with the patient or authorized representative who has indicated his/her understanding and acceptance.   Dental advisory given  Plan Discussed with: CRNA  Anesthesia Plan Comments: (Discussed general versus spinal. Believe with lung issues, spinal may be  preferred. Discussed risks/benefits of spinal including headache, backache, failure, bleeding, infection, and nerve damage. Patient consents to spinal. Questions answered. Coagulation studies and platelet count acceptable.)       Anesthesia Quick Evaluation

## 2013-08-06 ENCOUNTER — Encounter (HOSPITAL_COMMUNITY)
Admission: RE | Disposition: A | Discharge status: Skilled Nursing Facility | Payer: Self-pay | Source: Ambulatory Visit | Attending: Orthopedic Surgery

## 2013-08-06 ENCOUNTER — Encounter (HOSPITAL_COMMUNITY): Payer: Medicare Other | Admitting: Anesthesiology

## 2013-08-06 ENCOUNTER — Inpatient Hospital Stay (HOSPITAL_COMMUNITY)
Admission: RE | Admit: 2013-08-06 | Discharge: 2013-08-09 | DRG: 467 | Disposition: A | Discharge status: Skilled Nursing Facility | Payer: Medicare Other | Source: Ambulatory Visit | Attending: Orthopedic Surgery | Admitting: Orthopedic Surgery

## 2013-08-06 ENCOUNTER — Encounter (HOSPITAL_COMMUNITY): Payer: Self-pay

## 2013-08-06 ENCOUNTER — Inpatient Hospital Stay (HOSPITAL_COMMUNITY): Payer: Medicare Other | Admitting: Anesthesiology

## 2013-08-06 DIAGNOSIS — Z79899 Other long term (current) drug therapy: Secondary | ICD-10-CM | POA: Diagnosis not present

## 2013-08-06 DIAGNOSIS — I252 Old myocardial infarction: Secondary | ICD-10-CM | POA: Diagnosis not present

## 2013-08-06 DIAGNOSIS — Z77090 Contact with and (suspected) exposure to asbestos: Secondary | ICD-10-CM | POA: Diagnosis not present

## 2013-08-06 DIAGNOSIS — E7800 Pure hypercholesterolemia, unspecified: Secondary | ICD-10-CM | POA: Diagnosis present

## 2013-08-06 DIAGNOSIS — I6529 Occlusion and stenosis of unspecified carotid artery: Secondary | ICD-10-CM | POA: Diagnosis present

## 2013-08-06 DIAGNOSIS — I739 Peripheral vascular disease, unspecified: Secondary | ICD-10-CM | POA: Diagnosis present

## 2013-08-06 DIAGNOSIS — F17200 Nicotine dependence, unspecified, uncomplicated: Secondary | ICD-10-CM | POA: Diagnosis present

## 2013-08-06 DIAGNOSIS — M6281 Muscle weakness (generalized): Secondary | ICD-10-CM | POA: Diagnosis not present

## 2013-08-06 DIAGNOSIS — T84039A Mechanical loosening of unspecified internal prosthetic joint, initial encounter: Secondary | ICD-10-CM | POA: Diagnosis not present

## 2013-08-06 DIAGNOSIS — Y831 Surgical operation with implant of artificial internal device as the cause of abnormal reaction of the patient, or of later complication, without mention of misadventure at the time of the procedure: Secondary | ICD-10-CM | POA: Diagnosis present

## 2013-08-06 DIAGNOSIS — I10 Essential (primary) hypertension: Secondary | ICD-10-CM | POA: Diagnosis present

## 2013-08-06 DIAGNOSIS — E782 Mixed hyperlipidemia: Secondary | ICD-10-CM | POA: Diagnosis present

## 2013-08-06 DIAGNOSIS — T84099A Other mechanical complication of unspecified internal joint prosthesis, initial encounter: Secondary | ICD-10-CM | POA: Diagnosis not present

## 2013-08-06 DIAGNOSIS — D62 Acute posthemorrhagic anemia: Secondary | ICD-10-CM | POA: Diagnosis not present

## 2013-08-06 DIAGNOSIS — E785 Hyperlipidemia, unspecified: Secondary | ICD-10-CM | POA: Diagnosis not present

## 2013-08-06 DIAGNOSIS — I2510 Atherosclerotic heart disease of native coronary artery without angina pectoris: Secondary | ICD-10-CM | POA: Diagnosis present

## 2013-08-06 DIAGNOSIS — M549 Dorsalgia, unspecified: Secondary | ICD-10-CM | POA: Diagnosis not present

## 2013-08-06 DIAGNOSIS — Z8249 Family history of ischemic heart disease and other diseases of the circulatory system: Secondary | ICD-10-CM

## 2013-08-06 DIAGNOSIS — Z85528 Personal history of other malignant neoplasm of kidney: Secondary | ICD-10-CM | POA: Diagnosis not present

## 2013-08-06 DIAGNOSIS — Z96659 Presence of unspecified artificial knee joint: Secondary | ICD-10-CM

## 2013-08-06 DIAGNOSIS — F329 Major depressive disorder, single episode, unspecified: Secondary | ICD-10-CM | POA: Diagnosis present

## 2013-08-06 DIAGNOSIS — K219 Gastro-esophageal reflux disease without esophagitis: Secondary | ICD-10-CM | POA: Diagnosis present

## 2013-08-06 DIAGNOSIS — F3289 Other specified depressive episodes: Secondary | ICD-10-CM | POA: Diagnosis present

## 2013-08-06 DIAGNOSIS — Z7982 Long term (current) use of aspirin: Secondary | ICD-10-CM

## 2013-08-06 DIAGNOSIS — J4489 Other specified chronic obstructive pulmonary disease: Secondary | ICD-10-CM | POA: Diagnosis present

## 2013-08-06 DIAGNOSIS — G4700 Insomnia, unspecified: Secondary | ICD-10-CM | POA: Diagnosis not present

## 2013-08-06 DIAGNOSIS — C61 Malignant neoplasm of prostate: Secondary | ICD-10-CM | POA: Diagnosis present

## 2013-08-06 DIAGNOSIS — R269 Unspecified abnormalities of gait and mobility: Secondary | ICD-10-CM | POA: Diagnosis not present

## 2013-08-06 DIAGNOSIS — J449 Chronic obstructive pulmonary disease, unspecified: Secondary | ICD-10-CM | POA: Diagnosis not present

## 2013-08-06 DIAGNOSIS — E039 Hypothyroidism, unspecified: Secondary | ICD-10-CM | POA: Diagnosis present

## 2013-08-06 DIAGNOSIS — T8484XA Pain due to internal orthopedic prosthetic devices, implants and grafts, initial encounter: Secondary | ICD-10-CM

## 2013-08-06 DIAGNOSIS — Z5189 Encounter for other specified aftercare: Secondary | ICD-10-CM | POA: Diagnosis not present

## 2013-08-06 DIAGNOSIS — K5900 Constipation, unspecified: Secondary | ICD-10-CM | POA: Diagnosis not present

## 2013-08-06 DIAGNOSIS — Z471 Aftercare following joint replacement surgery: Secondary | ICD-10-CM | POA: Diagnosis not present

## 2013-08-06 HISTORY — PX: TOTAL KNEE REVISION: SHX996

## 2013-08-06 SURGERY — TOTAL KNEE REVISION
Anesthesia: Spinal | Site: Knee | Laterality: Right

## 2013-08-06 MED ORDER — PROPOFOL 10 MG/ML IV BOLUS
INTRAVENOUS | Status: AC
  Filled 2013-08-06: qty 20

## 2013-08-06 MED ORDER — FLUTICASONE PROPIONATE 50 MCG/ACT NA SUSP
2.0000 | Freq: Every day | NASAL | Status: DC
  Administered 2013-08-06: 2 via NASAL
  Filled 2013-08-06: qty 16

## 2013-08-06 MED ORDER — MOMETASONE FURO-FORMOTEROL FUM 100-5 MCG/ACT IN AERO
2.0000 | INHALATION_SPRAY | Freq: Two times a day (BID) | RESPIRATORY_TRACT | Status: DC
  Administered 2013-08-06 – 2013-08-09 (×5): 2 via RESPIRATORY_TRACT
  Filled 2013-08-06: qty 8.8

## 2013-08-06 MED ORDER — ACETAMINOPHEN 650 MG RE SUPP: 650.0000 mg | Freq: Four times a day (QID) | RECTAL | Status: DC | PRN

## 2013-08-06 MED ORDER — POLYETHYLENE GLYCOL 3350 17 G PO PACK
17.0000 g | PACK | Freq: Every day | ORAL | Status: DC | PRN
  Administered 2013-08-08: 17 g via ORAL

## 2013-08-06 MED ORDER — DEXAMETHASONE SODIUM PHOSPHATE 10 MG/ML IJ SOLN
10.0000 mg | Freq: Once | INTRAMUSCULAR | Status: AC
  Administered 2013-08-06: 10 mg via INTRAVENOUS

## 2013-08-06 MED ORDER — HYDROMORPHONE HCL PF 1 MG/ML IJ SOLN
0.5000 mg | INTRAMUSCULAR | Status: DC | PRN
  Administered 2013-08-06: 0.5 mg via INTRAVENOUS
  Filled 2013-08-06: qty 1

## 2013-08-06 MED ORDER — MIDAZOLAM HCL 5 MG/5ML IJ SOLN
INTRAMUSCULAR | Status: DC | PRN
  Administered 2013-08-06: 2 mg via INTRAVENOUS

## 2013-08-06 MED ORDER — BUPIVACAINE HCL (PF) 0.75 % IJ SOLN
INTRAMUSCULAR | Status: DC | PRN
  Administered 2013-08-06: 15 mg

## 2013-08-06 MED ORDER — SODIUM CHLORIDE 0.9 % IV SOLN: INTRAVENOUS | Status: DC

## 2013-08-06 MED ORDER — MENTHOL 3 MG MT LOZG: 1.0000 | LOZENGE | OROMUCOSAL | Status: DC | PRN

## 2013-08-06 MED ORDER — METOCLOPRAMIDE HCL 10 MG PO TABS: 5.0000 mg | ORAL_TABLET | Freq: Three times a day (TID) | ORAL | Status: DC | PRN

## 2013-08-06 MED ORDER — TAMSULOSIN HCL 0.4 MG PO CAPS
0.4000 mg | ORAL_CAPSULE | Freq: Every day | ORAL | Status: DC
  Administered 2013-08-06 – 2013-08-08 (×3): 0.4 mg via ORAL
  Filled 2013-08-06 (×4): qty 1

## 2013-08-06 MED ORDER — ACETAMINOPHEN 10 MG/ML IV SOLN
1000.0000 mg | Freq: Once | INTRAVENOUS | Status: AC
  Administered 2013-08-06: 1000 mg via INTRAVENOUS
  Filled 2013-08-06 (×2): qty 100

## 2013-08-06 MED ORDER — BUPIVACAINE-EPINEPHRINE PF 0.25-1:200000 % IJ SOLN
INTRAMUSCULAR | Status: AC
  Filled 2013-08-06: qty 30

## 2013-08-06 MED ORDER — METOCLOPRAMIDE HCL 5 MG/ML IJ SOLN: 5.0000 mg | Freq: Three times a day (TID) | INTRAMUSCULAR | Status: DC | PRN

## 2013-08-06 MED ORDER — BUPIVACAINE LIPOSOME 1.3 % IJ SUSP
20.0000 mL | Freq: Once | INTRAMUSCULAR | Status: DC
  Filled 2013-08-06: qty 20

## 2013-08-06 MED ORDER — DIPHENHYDRAMINE HCL 12.5 MG/5ML PO ELIX: 12.5000 mg | ORAL_SOLUTION | ORAL | Status: DC | PRN

## 2013-08-06 MED ORDER — LACTATED RINGERS IV SOLN
INTRAVENOUS | Status: DC | PRN
  Administered 2013-08-06 (×2): via INTRAVENOUS

## 2013-08-06 MED ORDER — FENTANYL CITRATE 0.05 MG/ML IJ SOLN
INTRAMUSCULAR | Status: DC | PRN
  Administered 2013-08-06: 100 ug via INTRAVENOUS

## 2013-08-06 MED ORDER — BUPIVACAINE HCL (PF) 0.25 % IJ SOLN
INTRAMUSCULAR | Status: AC
  Filled 2013-08-06: qty 30

## 2013-08-06 MED ORDER — SODIUM CHLORIDE 0.9 % IJ SOLN
INTRAMUSCULAR | Status: AC
  Filled 2013-08-06: qty 50

## 2013-08-06 MED ORDER — DEXAMETHASONE 6 MG PO TABS
10.0000 mg | ORAL_TABLET | Freq: Every day | ORAL | Status: AC
  Administered 2013-08-07: 10 mg via ORAL
  Filled 2013-08-06: qty 1

## 2013-08-06 MED ORDER — PROPOFOL INFUSION 10 MG/ML OPTIME
INTRAVENOUS | Status: DC | PRN
  Administered 2013-08-06: 60 ug/kg/min via INTRAVENOUS

## 2013-08-06 MED ORDER — ONDANSETRON HCL 4 MG PO TABS
4.0000 mg | ORAL_TABLET | Freq: Four times a day (QID) | ORAL | Status: DC | PRN
Start: 2013-08-06 — End: 2013-08-09

## 2013-08-06 MED ORDER — DSS 100 MG PO CAPS: 100.0000 mg | ORAL_CAPSULE | Freq: Two times a day (BID) | ORAL | Status: DC

## 2013-08-06 MED ORDER — ALBUTEROL SULFATE (2.5 MG/3ML) 0.083% IN NEBU: 2.5000 mg | INHALATION_SOLUTION | Freq: Four times a day (QID) | RESPIRATORY_TRACT | Status: DC | PRN

## 2013-08-06 MED ORDER — LORATADINE 10 MG PO TABS
10.0000 mg | ORAL_TABLET | Freq: Every day | ORAL | Status: DC
  Administered 2013-08-07 – 2013-08-09 (×3): 10 mg via ORAL
  Filled 2013-08-06 (×3): qty 1

## 2013-08-06 MED ORDER — METHOCARBAMOL 100 MG/ML IJ SOLN
500.0000 mg | Freq: Four times a day (QID) | INTRAVENOUS | Status: DC | PRN
  Administered 2013-08-06: 500 mg via INTRAVENOUS
  Filled 2013-08-06: qty 5

## 2013-08-06 MED ORDER — ONDANSETRON HCL 4 MG/2ML IJ SOLN: 4.0000 mg | Freq: Four times a day (QID) | INTRAMUSCULAR | Status: DC | PRN

## 2013-08-06 MED ORDER — FLEET ENEMA 7-19 GM/118ML RE ENEM: 1.0000 | ENEMA | Freq: Once | RECTAL | Status: AC | PRN

## 2013-08-06 MED ORDER — SODIUM CHLORIDE 0.9 % IR SOLN
Status: DC | PRN
  Administered 2013-08-06: 1000 mL

## 2013-08-06 MED ORDER — PHENOL 1.4 % MT LIQD: 1.0000 | OROMUCOSAL | Status: DC | PRN

## 2013-08-06 MED ORDER — METHOCARBAMOL 500 MG PO TABS
500.0000 mg | ORAL_TABLET | Freq: Four times a day (QID) | ORAL | Status: DC | PRN
  Administered 2013-08-06 – 2013-08-07 (×2): 500 mg via ORAL
  Filled 2013-08-06 (×3): qty 1

## 2013-08-06 MED ORDER — VANCOMYCIN HCL IN DEXTROSE 1-5 GM/200ML-% IV SOLN
INTRAVENOUS | Status: AC
  Filled 2013-08-06: qty 200

## 2013-08-06 MED ORDER — NITROGLYCERIN 0.4 MG SL SUBL: 0.4000 mg | SUBLINGUAL_TABLET | SUBLINGUAL | Status: DC | PRN

## 2013-08-06 MED ORDER — HYDROMORPHONE HCL PF 1 MG/ML IJ SOLN: 0.2500 mg | INTRAMUSCULAR | Status: DC | PRN

## 2013-08-06 MED ORDER — SODIUM CHLORIDE 0.9 % IJ SOLN
INTRAMUSCULAR | Status: AC
  Filled 2013-08-06: qty 10

## 2013-08-06 MED ORDER — FERROUS SULFATE 325 (65 FE) MG PO TABS
325.0000 mg | ORAL_TABLET | Freq: Every day | ORAL | Status: DC
  Administered 2013-08-06: 325 mg via ORAL
  Filled 2013-08-06 (×4): qty 1

## 2013-08-06 MED ORDER — VANCOMYCIN HCL IN DEXTROSE 1-5 GM/200ML-% IV SOLN
1000.0000 mg | INTRAVENOUS | Status: AC
  Administered 2013-08-06: 1000 mg via INTRAVENOUS

## 2013-08-06 MED ORDER — BUPIVACAINE-EPINEPHRINE (PF) 0.25% -1:200000 IJ SOLN
INTRAMUSCULAR | Status: DC | PRN
  Administered 2013-08-06: 25 mL

## 2013-08-06 MED ORDER — TRAZODONE HCL 50 MG PO TABS
100.0000 mg | ORAL_TABLET | Freq: Every evening | ORAL | Status: DC | PRN
  Administered 2013-08-06 – 2013-08-08 (×3): 100 mg via ORAL
  Filled 2013-08-06 (×3): qty 2

## 2013-08-06 MED ORDER — PROMETHAZINE HCL 25 MG/ML IJ SOLN: 6.2500 mg | INTRAMUSCULAR | Status: DC | PRN

## 2013-08-06 MED ORDER — ATORVASTATIN CALCIUM 40 MG PO TABS
40.0000 mg | ORAL_TABLET | Freq: Every day | ORAL | Status: DC
  Administered 2013-08-06 – 2013-08-08 (×3): 40 mg via ORAL
  Filled 2013-08-06 (×4): qty 1

## 2013-08-06 MED ORDER — TRAMADOL HCL 50 MG PO TABS: 50.0000 mg | ORAL_TABLET | Freq: Four times a day (QID) | ORAL | Status: DC | PRN

## 2013-08-06 MED ORDER — THEOPHYLLINE ER 100 MG PO TB12
100.0000 mg | ORAL_TABLET | Freq: Two times a day (BID) | ORAL | Status: DC
  Administered 2013-08-06 – 2013-08-09 (×6): 100 mg via ORAL
  Filled 2013-08-06 (×8): qty 1

## 2013-08-06 MED ORDER — BISACODYL 10 MG RE SUPP: 10.0000 mg | Freq: Every day | RECTAL | Status: DC | PRN

## 2013-08-06 MED ORDER — POLYETHYLENE GLYCOL 3350 17 G PO PACK: 17.0000 g | PACK | Freq: Every day | ORAL | Status: DC | PRN

## 2013-08-06 MED ORDER — RIVAROXABAN 10 MG PO TABS: 10.0000 mg | ORAL_TABLET | Freq: Every day | ORAL | Status: DC

## 2013-08-06 MED ORDER — EPHEDRINE SULFATE 50 MG/ML IJ SOLN
INTRAMUSCULAR | Status: AC
  Filled 2013-08-06: qty 1

## 2013-08-06 MED ORDER — FAMOTIDINE 20 MG PO TABS
20.0000 mg | ORAL_TABLET | Freq: Every day | ORAL | Status: DC
  Administered 2013-08-07 – 2013-08-09 (×3): 20 mg via ORAL
  Filled 2013-08-06 (×3): qty 1

## 2013-08-06 MED ORDER — FENTANYL CITRATE 0.05 MG/ML IJ SOLN
INTRAMUSCULAR | Status: AC
  Filled 2013-08-06: qty 2

## 2013-08-06 MED ORDER — ACETAMINOPHEN 500 MG PO TABS
1000.0000 mg | ORAL_TABLET | Freq: Four times a day (QID) | ORAL | Status: AC
Start: 2013-08-06 — End: 2013-08-07
  Administered 2013-08-06 (×3): 1000 mg via ORAL
  Filled 2013-08-06 (×4): qty 2

## 2013-08-06 MED ORDER — CHLORHEXIDINE GLUCONATE 4 % EX LIQD: 60.0000 mL | Freq: Once | CUTANEOUS | Status: DC

## 2013-08-06 MED ORDER — AMLODIPINE BESYLATE 10 MG PO TABS
10.0000 mg | ORAL_TABLET | Freq: Every morning | ORAL | Status: DC
  Administered 2013-08-09: 10 mg via ORAL
  Filled 2013-08-06 (×3): qty 1

## 2013-08-06 MED ORDER — HYDROMORPHONE HCL 2 MG PO TABS: 2.0000 mg | ORAL_TABLET | ORAL | Status: DC | PRN

## 2013-08-06 MED ORDER — METHOCARBAMOL 500 MG PO TABS: 500.0000 mg | ORAL_TABLET | Freq: Four times a day (QID) | ORAL | Status: DC | PRN

## 2013-08-06 MED ORDER — EPHEDRINE SULFATE 50 MG/ML IJ SOLN
INTRAMUSCULAR | Status: DC | PRN
  Administered 2013-08-06: 5 mg via INTRAVENOUS
  Administered 2013-08-06: 10 mg via INTRAVENOUS

## 2013-08-06 MED ORDER — CLOBETASOL PROPIONATE 0.05 % EX CREA
1.0000 "application " | TOPICAL_CREAM | Freq: Every day | CUTANEOUS | Status: DC
  Administered 2013-08-07 – 2013-08-08 (×2): 1 via TOPICAL
  Filled 2013-08-06: qty 15

## 2013-08-06 MED ORDER — TRAMADOL HCL 50 MG PO TABS
50.0000 mg | ORAL_TABLET | Freq: Four times a day (QID) | ORAL | Status: DC | PRN
  Administered 2013-08-06 – 2013-08-08 (×7): 50 mg via ORAL
  Filled 2013-08-06 (×7): qty 1

## 2013-08-06 MED ORDER — VANCOMYCIN HCL IN DEXTROSE 1-5 GM/200ML-% IV SOLN
1000.0000 mg | Freq: Two times a day (BID) | INTRAVENOUS | Status: AC
  Administered 2013-08-06: 1000 mg via INTRAVENOUS
  Filled 2013-08-06: qty 200

## 2013-08-06 MED ORDER — DEXAMETHASONE SODIUM PHOSPHATE 10 MG/ML IJ SOLN
INTRAMUSCULAR | Status: AC
  Filled 2013-08-06: qty 1

## 2013-08-06 MED ORDER — SODIUM CHLORIDE 0.9 % IV SOLN
INTRAVENOUS | Status: DC
  Administered 2013-08-06: 12:00:00 via INTRAVENOUS

## 2013-08-06 MED ORDER — TIOTROPIUM BROMIDE MONOHYDRATE 18 MCG IN CAPS
18.0000 ug | ORAL_CAPSULE | Freq: Every day | RESPIRATORY_TRACT | Status: DC
  Administered 2013-08-08 – 2013-08-09 (×2): 18 ug via RESPIRATORY_TRACT
  Filled 2013-08-06: qty 5

## 2013-08-06 MED ORDER — ACETAMINOPHEN 325 MG PO TABS: 650.0000 mg | ORAL_TABLET | Freq: Four times a day (QID) | ORAL | Status: DC | PRN

## 2013-08-06 MED ORDER — ISOSORBIDE MONONITRATE ER 60 MG PO TB24
60.0000 mg | ORAL_TABLET | Freq: Two times a day (BID) | ORAL | Status: DC
  Administered 2013-08-06 – 2013-08-09 (×5): 60 mg via ORAL
  Filled 2013-08-06 (×8): qty 1

## 2013-08-06 MED ORDER — BUPIVACAINE LIPOSOME 1.3 % IJ SUSP
INTRAMUSCULAR | Status: DC | PRN
  Administered 2013-08-06: 20 mL

## 2013-08-06 MED ORDER — DEXAMETHASONE SODIUM PHOSPHATE 10 MG/ML IJ SOLN
10.0000 mg | Freq: Every day | INTRAMUSCULAR | Status: AC
  Filled 2013-08-06: qty 1

## 2013-08-06 MED ORDER — MIDAZOLAM HCL 2 MG/2ML IJ SOLN
INTRAMUSCULAR | Status: AC
  Filled 2013-08-06: qty 2

## 2013-08-06 MED ORDER — SODIUM CHLORIDE 0.9 % IJ SOLN
INTRAMUSCULAR | Status: DC | PRN
  Administered 2013-08-06: 30 mL

## 2013-08-06 MED ORDER — DOCUSATE SODIUM 100 MG PO CAPS
100.0000 mg | ORAL_CAPSULE | Freq: Two times a day (BID) | ORAL | Status: DC
  Administered 2013-08-06 – 2013-08-09 (×7): 100 mg via ORAL

## 2013-08-06 MED ORDER — RIVAROXABAN 10 MG PO TABS
10.0000 mg | ORAL_TABLET | Freq: Every day | ORAL | Status: DC
  Administered 2013-08-07 – 2013-08-09 (×3): 10 mg via ORAL
  Filled 2013-08-06 (×4): qty 1

## 2013-08-06 MED ORDER — 0.9 % SODIUM CHLORIDE (POUR BTL) OPTIME
TOPICAL | Status: DC | PRN
  Administered 2013-08-06: 1000 mL

## 2013-08-06 MED ORDER — HYDROMORPHONE HCL 2 MG PO TABS
2.0000 mg | ORAL_TABLET | ORAL | Status: DC | PRN
  Administered 2013-08-07 – 2013-08-09 (×2): 2 mg via ORAL
  Filled 2013-08-06 (×2): qty 1

## 2013-08-06 MED ORDER — CITALOPRAM HYDROBROMIDE 20 MG PO TABS
20.0000 mg | ORAL_TABLET | Freq: Every morning | ORAL | Status: DC
  Administered 2013-08-07 – 2013-08-09 (×3): 20 mg via ORAL
  Filled 2013-08-06 (×3): qty 1

## 2013-08-06 MED ORDER — LEVOTHYROXINE SODIUM 125 MCG PO TABS
125.0000 ug | ORAL_TABLET | Freq: Every morning | ORAL | Status: DC
  Administered 2013-08-07 – 2013-08-09 (×3): 125 ug via ORAL
  Filled 2013-08-06 (×3): qty 1

## 2013-08-06 SURGICAL SUPPLY — 72 items
BAG SPEC THK2 15X12 ZIP CLS (MISCELLANEOUS) ×1
BAG ZIPLOCK 12X15 (MISCELLANEOUS) ×2 IMPLANT
BANDAGE ELASTIC 6 VELCRO ST LF (GAUZE/BANDAGES/DRESSINGS) ×3 IMPLANT
BANDAGE ESMARK 6X9 LF (GAUZE/BANDAGES/DRESSINGS) ×1 IMPLANT
BLADE SAG 18X100X1.27 (BLADE) ×3 IMPLANT
BLADE SAW SGTL 11.0X1.19X90.0M (BLADE) ×3 IMPLANT
BNDG CMPR 9X6 STRL LF SNTH (GAUZE/BANDAGES/DRESSINGS) ×1
BNDG ESMARK 6X9 LF (GAUZE/BANDAGES/DRESSINGS) ×3
BONE CEMENT GENTAMICIN (Cement) ×6 IMPLANT
CAP UPCHARGE REVISION TRAY ×2 IMPLANT
CAPT RP KNEE ×2 IMPLANT
CEMENT BONE GENTAMICIN 40 (Cement) ×3 IMPLANT
CLOSURE WOUND 1/2 X4 (GAUZE/BANDAGES/DRESSINGS) ×1
CONT SPECI 4OZ STER CLIK (MISCELLANEOUS) IMPLANT
CUFF TOURN SGL QUICK 34 (TOURNIQUET CUFF) ×3
CUFF TRNQT CYL 34X4X40X1 (TOURNIQUET CUFF) ×1 IMPLANT
DRAPE EXTREMITY T 121X128X90 (DRAPE) ×3 IMPLANT
DRAPE POUCH INSTRU U-SHP 10X18 (DRAPES) ×3 IMPLANT
DRAPE U-SHAPE 47X51 STRL (DRAPES) ×3 IMPLANT
DRSG ADAPTIC 3X8 NADH LF (GAUZE/BANDAGES/DRESSINGS) ×3 IMPLANT
DRSG PAD ABDOMINAL 8X10 ST (GAUZE/BANDAGES/DRESSINGS) ×3 IMPLANT
DURAPREP 26ML APPLICATOR (WOUND CARE) ×3 IMPLANT
ELECT REM PT RETURN 9FT ADLT (ELECTROSURGICAL) ×3
ELECTRODE REM PT RTRN 9FT ADLT (ELECTROSURGICAL) ×1 IMPLANT
EVACUATOR 1/8 PVC DRAIN (DRAIN) ×3 IMPLANT
FACESHIELD WRAPAROUND (MASK) ×15 IMPLANT
FACESHIELD WRAPAROUND OR TEAM (MASK) ×5 IMPLANT
GLOVE BIO SURGEON STRL SZ7.5 (GLOVE) IMPLANT
GLOVE BIO SURGEON STRL SZ8 (GLOVE) ×5 IMPLANT
GLOVE BIOGEL PI IND STRL 6.5 (GLOVE) IMPLANT
GLOVE BIOGEL PI IND STRL 7.0 (GLOVE) IMPLANT
GLOVE BIOGEL PI IND STRL 8 (GLOVE) ×1 IMPLANT
GLOVE BIOGEL PI IND STRL 8.5 (GLOVE) IMPLANT
GLOVE BIOGEL PI INDICATOR 6.5 (GLOVE) ×2
GLOVE BIOGEL PI INDICATOR 7.0 (GLOVE) ×2
GLOVE BIOGEL PI INDICATOR 8 (GLOVE) ×4
GLOVE BIOGEL PI INDICATOR 8.5 (GLOVE) ×2
GLOVE SURG SS PI 6.5 STRL IVOR (GLOVE) IMPLANT
GLOVE SURG SS PI 7.0 STRL IVOR (GLOVE) ×4 IMPLANT
GOWN STRL REUS W/TWL LRG LVL3 (GOWN DISPOSABLE) ×7 IMPLANT
GOWN STRL REUS W/TWL XL LVL3 (GOWN DISPOSABLE) ×4 IMPLANT
HANDPIECE INTERPULSE COAX TIP (DISPOSABLE) ×3
IMMOBILIZER KNEE 20 (SOFTGOODS) ×3 IMPLANT
KIT BASIN OR (CUSTOM PROCEDURE TRAY) ×3 IMPLANT
MANIFOLD NEPTUNE II (INSTRUMENTS) ×3 IMPLANT
NDL SAFETY ECLIPSE 18X1.5 (NEEDLE) ×2 IMPLANT
NEEDLE HYPO 18GX1.5 SHARP (NEEDLE) ×6
NS IRRIG 1000ML POUR BTL (IV SOLUTION) ×3 IMPLANT
PACK TOTAL JOINT (CUSTOM PROCEDURE TRAY) ×3 IMPLANT
PADDING CAST COTTON 6X4 STRL (CAST SUPPLIES) ×5 IMPLANT
POSITIONER SURGICAL ARM (MISCELLANEOUS) ×3 IMPLANT
SET HNDPC FAN SPRY TIP SCT (DISPOSABLE) ×1 IMPLANT
SPONGE GAUZE 4X4 12PLY (GAUZE/BANDAGES/DRESSINGS) ×3 IMPLANT
STAPLER VISISTAT 35W (STAPLE) ×3 IMPLANT
STRIP CLOSURE SKIN 1/2X4 (GAUZE/BANDAGES/DRESSINGS) ×1 IMPLANT
SUCTION FRAZIER 12FR DISP (SUCTIONS) ×3 IMPLANT
SUT MNCRL AB 4-0 PS2 18 (SUTURE) ×2 IMPLANT
SUT VIC AB 2-0 CT1 27 (SUTURE) ×9
SUT VIC AB 2-0 CT1 TAPERPNT 27 (SUTURE) ×3 IMPLANT
SUT VLOC 180 0 24IN GS25 (SUTURE) ×3 IMPLANT
SWAB COLLECTION DEVICE MRSA (MISCELLANEOUS) IMPLANT
SYR 20CC LL (SYRINGE) ×3 IMPLANT
SYR 50ML LL SCALE MARK (SYRINGE) ×3 IMPLANT
TOWEL OR 17X26 10 PK STRL BLUE (TOWEL DISPOSABLE) ×5 IMPLANT
TOWEL OR NON WOVEN STRL DISP B (DISPOSABLE) ×2 IMPLANT
TOWER CARTRIDGE SMART MIX (DISPOSABLE) ×3 IMPLANT
TRAY FOLEY CATH 14FRSI W/METER (CATHETERS) ×1 IMPLANT
TRAY FOLEY CATH 16FRSI W/METER (SET/KITS/TRAYS/PACK) ×2 IMPLANT
TRAY SLEEVE CEM ML (Knees) ×2 IMPLANT
TUBE ANAEROBIC SPECIMEN COL (MISCELLANEOUS) IMPLANT
WATER STERILE IRR 1500ML POUR (IV SOLUTION) ×5 IMPLANT
WRAP KNEE MAXI GEL POST OP (GAUZE/BANDAGES/DRESSINGS) IMPLANT

## 2013-08-06 NOTE — Progress Notes (Signed)
Clinical Social Work Department CLINICAL SOCIAL WORK PLACEMENT NOTE 08/06/2013  Patient:  Michael [name], Michael [name]  Account Number:  [id number] Admit date:  08/06/2013  Clinical Social Worker:  Werner Lean, LCSW  Date/time:  08/06/2013 02:16 PM  Clinical Social Work is seeking post-discharge placement for this patient at the following level of care:   SKILLED NURSING   (*CSW will update this form in Epic as items are completed)     Patient/family provided with [facility] Department of Clinical Social Work's list of facilities offering this level of care within the geographic area requested by the patient (or if unable, by the patient's family).  08/06/2013  Patient/family informed of their freedom to choose among providers that offer the needed level of care, that participate in Medicare, Medicaid or managed care program needed by the patient, have an available bed and are willing to accept the patient.    Patient/family informed of [facility]' ownership interest in [facility], as well as of the fact that they are under no obligation to receive care at this facility.  PASARR submitted to [employer] on  PASARR number received from [place] on   FL2 transmitted to all facilities in geographic area requested by pt/family on  08/06/2013 FL2 transmitted to all facilities within larger geographic area on 11/30/2009  Patient informed that his/her managed care company has contracts with or will negotiate with  certain facilities, including the following:     Patient/family informed of bed offers received:  08/06/2013 Patient chooses bed at  Physician recommends and patient chooses bed at  [facility]  Patient to be transferred to  on   Patient to be transferred to facility by   The following physician request were entered in Epic:   Additional Comments:  Werner Lean LCSW [phone]

## 2013-08-06 NOTE — H&P (View-Only) (Signed)
Michael [name] DOB: [birth year] Widowed / Language: English / Race: White Male  Date of Admission:  08-06-2013  Chief Complaint:  Failed Right Uni Knee Replacement  History of Present Illness The patient is a 80 year old male who comes in for a preoperative History and Physical. The patient is scheduled for a right total knee arthroplasty (conversion of right uni knee to total knee) to be performed by Dr. Dione Plover. Aluisio, MD at [facility] on 08-06-2013. The patient is a 80 year old male who presents for follow up of their knee. The patient is being followed for their right knee pain and s/p total uni knee 9 years ago. Symptoms reported today include: pain (medial side, unable to go up steps due to pain, worse if twists knee) and swelling (medial side). The patient feels that they are doing poorly. The patient has reported improvement of their symptoms with: Cortisone injections (last one done 08/2012). The patient indicates that they have questions or concerns today regarding pain and their progress at this point (options for pain relief). [name] has had worsening problems now with the right knee. We had previously replaced his left knee and he has done well with that. He had a right knee unicompartment replacement many years ago and is having increasing difficulty. I gave him a cortisone injection back in the spring and he did do well with that for about a month or so. Unfortunately, the pain has come back as bad as ever. He is at a stage now where he is tired of dealing with this and wants to do what it will take to get this better. He is ready to proceed with converting his previous Uni Knee over to a Total Knee Replacement. They have been treated conservatively in the past for the above stated problem and despite conservative measures, they continue to have progressive pain and severe functional limitations and dysfunction. They have failed non-operative management including  home exercise, medications, and injections. It is felt that they would benefit from undergoing conversion to a total joint replacement. Risks and benefits of the procedure have been discussed with the patient and they elect to proceed with surgery. There are no active contraindications to surgery such as ongoing infection or rapidly progressive neurological disease.  Allergies NU-IRON. 11/26/2006 Itching. CIPRO. 11/26/2006 OXYCODONE. 11/26/2006 Hives. PENICILLIN. 11/26/2006 Hives. Hydrocodone-Acetaminophen *ANALGESICS - OPIOID*. Confusion, Rash   Problem List/Past MedicalHistory S/P right unicompartmental knee replacement (V43.65) Lumbar disc degeneration (722.52) Lumbar pain (724.2) Cancer Hypothyroidism Chronic Obstructive Lung Disease Osteoarthritis Prostate Disease Diverticulitis Of Colon Coronary artery disease High blood pressure Prostate Cancer Hypercholesterolemia Carotid Arterial Disease Shingles. November 2001 Impaired Hearing Depression Coronary Artery Disease/Heart Disease Gastroesophageal Reflux Disease Degenerative Disc Disease   Family History Heart Disease. Brother. brother Cancer. grandmother mothers side Hypertension. grandmother mothers side   Social History Tobacco / smoke exposure. no Number of flights of stairs before winded. 2-3 Children. 5 or more Alcohol use. never consumed alcohol current drinker; only occasionally per week Tobacco use. Current every day smoker. current every day smoker; smoke(d) less than 1/2 pack(s) per day former smoker Living situation. live alone Illicit drug use. no Pain Contract. no Marital status. widowed Drug/Alcohol Rehab (Currently). no Current work status. retired Exercise. Exercises monthly; does other Exercises daily; does other Drug/Alcohol Rehab (Previously). no Advance Directives. Living Will, Healthcare POA   Medication History Ranitidine HCl (150MG  Capsule, 1 Oral two  times daily) Active. AmLODIPine Besylate (10MG  Tablet, 1 Oral daily) Active.  CeleXA (20MG  Tablet, 1 Oral daily) Active. Levothyroxine Sodium (125MCG Tablet, 1 Oral daily) Active. Aspirin (81MG  Tablet, 1 (one) Oral) Active. Ferrous Sulfate (325 (65 Fe)MG Tablet, 1 Oral daily) Active. Advair Diskus (250-50MCG/DOSE Aero Pow Br Act, 1 Inhalation two times daily) Active. Lipitor (40MG  Tablet, 1 Oral daily) Active. Flomax (0.4MG  Capsule, 1 Oral daily) Active. TraZODone HCl (100MG  Tablet, 1 Oral as needed) Active. Isosorbide Mononitrate ER (60MG  Tablet ER 24HR, 1.5 Oral daily) Active. Omega 3 (1200MG  Capsule, 2 Oral two times daily) Active. Multiple Vitamin (1 (one) Oral daily) Active. Nitroglycerin (0.4MG  Tab Sublingual, Sublingual as needed) Active. Nasonex (50MCG/ACT Suspension, 2 Nasal at bedtime) Active. Spiriva HandiHaler (18MCG Capsule, 1 Inhalation daily) Active. ProAir HFA (108 (90 Base)MCG/ACT Aerosol Soln, 1-2 Inhalation every 4-6 hours, as needed) Active. ZyrTEC Allergy (10MG  Capsule, 1 Oral daily) Active. Theophylline ER (200MG  Tablet ER 12HR, 0.5 Oral two times daily) Active.   Past Surgical History Heart Stents. 3 total stents (one stent is DES) Vasectomy Total Knee Replacement. Date: 11/2009. left, Dr. Adriana Mccallum Neck Disc Surgery. Date: 07/2002. Fusion Gallbladder Surgery. Date: 10/1993. laporoscopic Cataract Surgery. Date: 2010. bilateral Thyroidectomy; Total Right Unicompartmental Replacement. Date: 10/2003. Cardiac Catheterization. August 2003, August 2005, October 2005, June 2006, July 2009, October 2010, August 2012 Left Carotid Endarterectomy. Date: 08/2011. Right Carotid Endarterectomy. Date: 11/2012.   Review of Systems General:Not Present- Chills, Fever, Night Sweats, Fatigue, Weight Gain, Weight Loss and Memory Loss. Skin:Not Present- Hives, Itching, Rash, Eczema and Lesions. HEENT:Not Present- Tinnitus, Headache, Double Vision, Visual Loss, Hearing  Loss and Dentures. Respiratory:Present- Cough. Not Present- Shortness of breath with exertion, Shortness of breath at rest, Allergies, Coughing up blood and Chronic Cough. Cardiovascular:Not Present- Chest Pain, Racing/skipping heartbeats, Difficulty Breathing Lying Down, Murmur, Swelling and Palpitations. Gastrointestinal:Not Present- Bloody Stool, Heartburn, Abdominal Pain, Vomiting, Nausea, Constipation, Diarrhea, Difficulty Swallowing, Jaundice and Loss of appetitie. Male Genitourinary:Not Present- Urinary frequency, Blood in Urine, Weak urinary stream, Discharge, Flank Pain, Incontinence, Painful Urination, Urgency, Urinary Retention and Urinating at Night. Musculoskeletal:Present- Morning Stiffness. Not Present- Muscle Weakness, Muscle Pain, Joint Swelling, Joint Pain, Back Pain and Spasms. Neurological:Not Present- Tremor, Dizziness, Blackout spells, Paralysis, Difficulty with balance and Weakness. Psychiatric:Not Present- Insomnia.    Vitals Weight: 162 lb Height: 68 in Weight was reported by patient. Height was reported by patient. Body Surface Area: 1.88 m Body Mass Index: 24.63 kg/m Pulse: 76 (Regular) Resp.: 20 (Unlabored) BP: 112/60 (Sitting, Left Arm, Standard)     Physical Exam The physical exam findings are as follows:   General Mental Status - Alert, cooperative and good historian. General Appearance- pleasant. Not in acute distress. Orientation- Oriented X3. Build & Nutrition- Well nourished and Well developed.   Head and Neck Head- normocephalic, atraumatic . Neck Global Assessment- supple. no bruit auscultated on the right and no bruit auscultated on the left.   Eye Vision- Wears corrective lenses (part of the time). Pupil- Bilateral- Regular and Round. Motion- Bilateral- EOMI. ENMT full upper dentures and partial lower  Chest and Lung Exam Auscultation: Breath sounds:- clear at anterior chest  wall. Adventitious sounds:Expiratory wheeze- Right Lower Lobe (Posterior) (clears with deep cough on exam).   Cardiovascular Auscultation:Rhythm- Regular rate and rhythm. Heart Sounds- S1 WNL and S2 WNL. Murmurs & Other Heart Sounds:Auscultation of the heart reveals - No Murmurs.   Abdomen Palpation/Percussion:Tenderness- Abdomen is non-tender to palpation. Rigidity (guarding)- Abdomen is soft. Auscultation:Auscultation of the abdomen reveals - Bowel sounds normal.   Male Genitourinary Not done, not pertinent to present  illness  Musculoskeletal On exam, well developed male alert and oriented in no apparent distress. Evaluation of his right knee shows no effusion. There is no deformity. There is marked crepitus on range of motion of the knee. Range is about 5 to 120. There is no instability noted. He is very tender medially. His left knee looks great. Range is about 0-125. No tenderness or instability noted on the left.  RADIOGRAPHS: AP both knees and lateral are reviewed and show a unicompartmental replacement on the right side with significant lateral and patellofemoral arthritis.   Assessment & Plan S/P right unicompartmental knee replacement (V43.65)  Note: Plan is for a Conversion of right unicompartmental replacement to a Right Total Knee Replacement by Dr. [name].  Plan is to go [place] surgery.  PCP - Dr. Dalia Heading, [place] [facility] - Dr. Johnny Bridge, [facility]  The patient will not receive TXA (tranexamic acid) due to: Coronary and Carotid Arterial Disease  Signed electronically by Joelene Millin, III PA-C

## 2013-08-06 NOTE — Anesthesia Postprocedure Evaluation (Signed)
  Anesthesia Post-op Note  Patient: Michael [name]  Procedure(s) Performed: Procedure(s) (LRB): REVISION RIGHT TOTAL KNEE ARTHROPLASTY   (Right)  Patient Location: PACU  Anesthesia Type: Spinal  Level of Consciousness: awake and alert   Airway and Oxygen Therapy: Patient Spontanous Breathing  Post-op Pain: mild  Post-op Assessment: Post-op Vital signs reviewed, Patient's Cardiovascular Status Stable, Respiratory Function Stable, Patent Airway and No signs of Nausea or vomiting  Last Vitals:  Filed Vitals:   08/06/13 1102  BP:   Pulse: 60  Temp: 36.3 C  Resp: 15    Post-op Vital Signs: stable   Complications: No apparent anesthesia complications

## 2013-08-06 NOTE — Brief Op Note (Signed)
08/06/2013  9:19 AM  PATIENT:  Charod L Calip  80 y.o. male  PRE-OPERATIVE DIAGNOSIS:  FAILED RIGHT UNI-KNEE  POST-OPERATIVE DIAGNOSIS:  FAILED RIGHT UNI-KNEE  PROCEDURE:  Procedure(s): REVISION RIGHT TOTAL KNEE ARTHROPLASTY   (Right)  SURGEON:  Surgeon(s) and Role:    * Gearlean Alf, MD - Primary  PHYSICIAN ASSISTANT:   ASSISTANTS: Molli Barrows, PA-C   ANESTHESIA:   spinal  EBL:  Total I/O In: 1000 [I.V.:1000] Out: 100 [Urine:100]  BLOOD ADMINISTERED:none  DRAINS: (Medium) Hemovact drain(s) in the right knee with  Suction Open   LOCAL MEDICATIONS USED:  OTHER Exparel  SPECIMEN:  No Specimen  DISPOSITION OF SPECIMEN:  N/A  COUNTS:  YES  TOURNIQUET:   Total Tourniquet Time Documented: Thigh (Right) - 33 minutes Total: Thigh (Right) - 33 minutes   DICTATION: .Other Dictation: Dictation Number [id number]  PLAN OF CARE: Admit to inpatient   PATIENT DISPOSITION:  PACU - hemodynamically stable.

## 2013-08-06 NOTE — Progress Notes (Signed)
OT Cancellation Note  Patient Details Name: Michael [name] MRN: [id number] DOB: Feb 18, 1932   Cancelled Treatment:    Reason Eval/Treat Not Completed: Other (comment).  Noted pt plans snf for rehab.  OT eval is not needed for admission.  Will defer OT to that venue.    Lesle Chris 08/06/2013, 4:18 PM Lesle Chris, OTR/L [phone] 08/06/2013

## 2013-08-06 NOTE — Evaluation (Signed)
Physical Therapy Evaluation Patient Details Name: Michael [name] MRN: [id number] DOB: 1931-11-24 Today's Date: 08/06/2013   History of Present Illness  uni knee revised to totoal R  Clinical Impression  Pt tolerated ambulation very well. Pt will benefit from PT to address problems listed in  Chart. Pt  plans snf.    Follow Up Recommendations SNF    Equipment Recommendations  None recommended by PT    Recommendations for Other Services       Precautions / Restrictions Precautions Precautions: Knee;Fall      Mobility  Bed Mobility Overal bed mobility: Needs Assistance Bed Mobility: Supine to Sit     Supine to sit: Min guard     General bed mobility comments: safety ,  Transfers Overall transfer level: Needs assistance Equipment used: Rolling walker (2 wheeled) Transfers: Sit to/from Stand Sit to Stand: Min assist         General transfer comment: cues for safety  Ambulation/Gait Ambulation/Gait assistance: Min assist Ambulation Distance (Feet): 150 Feet Assistive device: Rolling walker (2 wheeled) Gait Pattern/deviations: Step-to pattern     General Gait Details: cues for safety inside RW,  Stairs            Wheelchair Mobility    Modified Rankin (Stroke Patients Only)       Balance                                             Pertinent Vitals/Pain no pain    Home Living Family/patient expects to be discharged to:: Skilled nursing facility Living Arrangements: Alone                    Prior Function Level of Independence: Independent               Hand Dominance        Extremity/Trunk Assessment   Upper Extremity Assessment: Overall WFL for tasks assessed           Lower Extremity Assessment: RLE deficits/detail RLE Deficits / Details: able to perform SLR       Communication   Communication: No difficulties  Cognition Arousal/Alertness: Awake/alert Behavior During Therapy: WFL for  tasks assessed/performed Overall Cognitive Status: Within Functional Limits for tasks assessed                      General Comments      Exercises        Assessment/Plan    PT Assessment Patient needs continued PT services  PT Diagnosis Difficulty walking   PT Problem List Decreased strength;Decreased range of motion;Decreased activity tolerance;Decreased mobility;Decreased knowledge of precautions;Decreased safety awareness;Decreased knowledge of use of DME  PT Treatment Interventions DME instruction;Gait training;Functional mobility training;Therapeutic activities;Therapeutic exercise;Patient/family education   PT Goals (Current goals can be found in the Care Plan section) Acute Rehab PT Goals Patient Stated Goal: To walk  PT Goal Formulation: With patient/family Time For Goal Achievement: 08/13/13 Potential to Achieve Goals: Good    Frequency 7X/week   Barriers to discharge Decreased caregiver support      Co-evaluation               End of Session Equipment Utilized During Treatment: Gait belt Activity Tolerance: Patient tolerated treatment well Patient left: in chair;with call bell/phone within reach;with family/visitor present Nurse Communication: Mobility status  Time: 6148-3073 PT Time Calculation (min): 23 min   Charges:   PT Evaluation $Initial PT Evaluation Tier I: 1 Procedure PT Treatments $Gait Training: 23-37 mins   PT G Codes:          Claretha Cooper 08/06/2013, 4:07 PM Tresa Endo PT [phone]

## 2013-08-06 NOTE — Transfer of Care (Signed)
Immediate Anesthesia Transfer of Care Note  Patient: Michael [name]  Procedure(s) Performed: Procedure(s): REVISION RIGHT TOTAL KNEE ARTHROPLASTY   (Right)  Patient Location: PACU  Anesthesia Type:Spinal  Level of Consciousness: awake, alert  and oriented  Airway & Oxygen Therapy: Patient Spontanous Breathing and Patient connected to face mask oxygen  Post-op Assessment: Report given to PACU RN and Post -op Vital signs reviewed and stable  Post vital signs: Reviewed and stable  Complications: No apparent anesthesia complications

## 2013-08-06 NOTE — Interval H&P Note (Signed)
History and Physical Interval Note:  08/06/2013 7:59 AM  Michael [name]  has presented today for surgery, with the diagnosis of FAILED RIGHT KNEE UNICOMPARTAMENTAL REPLACEMENT   The various methods of treatment have been discussed with the patient and family. After consideration of risks, benefits and other options for treatment, the patient has consented to  Procedure(s): REVISION RIGHT TOTAL KNEE ARTHROPLASTY   (Right) as a surgical intervention .  The patient's history has been reviewed, patient examined, no change in status, stable for surgery.  I have reviewed the patient's chart and labs.  Questions were answered to the patient's satisfaction.     [name]

## 2013-08-06 NOTE — Progress Notes (Signed)
Utilization review completed.  

## 2013-08-06 NOTE — Anesthesia Procedure Notes (Signed)
Spinal  Patient location during procedure: OR Start time: 08/06/2013 8:20 AM End time: 08/06/2013 8:25 AM Staffing CRNA/Resident: Harle Stanford R Performed by: resident/CRNA  Preanesthetic Checklist Completed: patient identified, site marked, surgical consent, pre-op evaluation, timeout performed, IV checked, risks and benefits discussed and monitors and equipment checked Spinal Block Patient position: sitting Prep: Betadine Patient monitoring: heart rate, cardiac monitor, continuous pulse ox and blood pressure Approach: midline Location: L3-4 Injection technique: single-shot Needle Needle type: Whitacre  Needle gauge: 22 G Needle length: 9 cm Needle insertion depth: 6 cm Assessment Sensory level: T6

## 2013-08-06 NOTE — Op Note (Signed)
NAMEYERICK, EGGEBRECHT NO.:  [id number]  MEDICAL RECORD NO.:  [id number]  LOCATION:  [facility]                         FACILITY:  [facility]  PHYSICIAN:  Gaynelle Arabian, M.D.    DATE OF BIRTH:  1931-11-28  DATE OF PROCEDURE:  08/06/2013 DATE OF DISCHARGE:                              OPERATIVE REPORT   PREOPERATIVE DIAGNOSIS:  Failed right unicompartmental knee replacement.  POSTOPERATIVE DIAGNOSIS:  Failed right unicompartmental knee replacement.  PROCEDURE: 1. Revision of right knee unicompartmental replacement. 2. Total knee arthroplasty.  SURGEON:  Gaynelle Arabian, M.D.  ASSISTANT:  Judith Part. Chabon, PA-C  ANESTHESIA:  Spinal.  ESTIMATED BLOOD LOSS:  Minimal.  DRAINS:  Hemovac x1.  TOURNIQUET TIME:  32 minutes at 300 mmHg.  COMPLICATIONS:  None.  CONDITION:  Stable to recovery.  BRIEF CLINICAL NOTE:  Mr. Hansley is a 79 year old male with a painful right unicompartmental knee replacement with  questionable signs of loosening as well as progression of lateral patellofemoral arthritis with bone-on-bone.  He presents now for revision of the unicompartmental knee to a total knee arthroplasty.  PROCEDURE IN DETAIL:  After successful administration of spinal anesthetic, a tourniquet was placed high on the right thigh and right lower extremity was prepped and draped in the usual sterile fashion. Extremities wrapped in an Esmarch, tourniquet inflated to 300 mmHg.  A midline incision was made with a 10 blade through the subcutaneous tissue to the level of the extensor mechanism.  A fresh blade was used to make a medial parapatellar arthrotomy.  Soft tissue on the proximal medial tibia was subperiosteally elevated to the joint line with a knife and into the semimembranosus bursa with a Cobb elevator.  Soft tissue laterally was elevated with attention being paid to avoid the patellar tendon on the tibial tubercle.  Patella was then everted.  Knee flexed to 90  degrees.  ACL and PCL are removed.  I then used a small osteotome to disrupt the interface between the femoral component and bone.  The femoral component was easily removed with essentially no bone loss.  We then used a drill to create a starting hole in distal femur.  Canal was thoroughly irrigated.  The 5-degree right valgus alignment guide was placed, and the cutting block is pinned to remove 10 mm off the lateral side.  Distal femoral resection was made with an oscillating saw.  The tibia was then subluxed forward.  The lateral meniscus was removed. The retractors were placed to sublux the tibia forward.  The extramedullary tibial alignment guide was placed referencing proximally at the medial aspect of the tibial tubercle and distally along the second metatarsal axis and tibial crest.  The block was pinned to resect just below the tibial component medially.  Resection was made with an oscillating saw.  Bone quality was excellent below the tibial component. I removed the keel which was the polyethylene keel and removed the cement around it.  Size 4 was the most appropriate tibial component and the proximal tibia was prepared to modular drill for the MBT revision tray.  This was a size 4 revision tray.  We then prepared for a 29 mm sleeve for rotational control.  The keel punch was then made.  The trial size 4 MBT revision tray with a 29 mm sleeve was then placed.  Femoral sizing block was placed.  Size 4 was most appropriate.  Rotation was marked off the epicondylar axis confirmed by creating rectangular flexion gap at 90 degrees.  Block was pinned in this position.  The anterior, posterior and chamfer cuts are made.  The intercondylar block was placed and that cut was made.  Size 4 posterior stabilized femur was placed.  A 15 mm trial inserts placed.  With 15 was a tiny bit of play, so went to 17.5 which allowed for full extension with excellent varus- valgus anterior-posterior  balance throughout full range of motion. Patella was everted, thickness measured to be 27 mm.  Freehand resection was taken to 15 mm, 41 template was placed, lug holes were drilled, trial patella was placed and it tracks normally.  Osteophytes were removed off the posterior femur with the trial in place.  All trials are removed and the cut bone surface was prepared with pulsatile lavage.  Cement was mixed and once ready for implantation, the size 4 MBT revision tray with a 29 mm sleeve was impacted into the femur, cemented and all extruded cement removed.  On the femoral side, the size 4 posterior stabilized femur was placed. A 17.5 trial inserts placed, knee held in full extension.  All extruded cement removed.  A 41 patella was cemented into place and held with the patellar clamp.  Once cement was fully hardened, then the permanent 17.5 mm posterior stabilized rotating platform insert was placed in the tibial tray.  Wound was copiously irrigated with saline solution.  A 20 mL of Exparel mixed with 30 mL of saline was then injected into the periosteum of the femur, the extensor mechanism and subcutaneous tissues.  An additional 20 mL of 0.25% Marcaine was injected into the same tissues.  The arthrotomy was then closed over Hemovac drain with running #1 V-Loc suture.  Tourniquet was released at total time of 32 minutes.  Minor bleeding stopped with cautery.  Subcu was closed with interrupted 2-0 Vicryl and subcuticular running 4-0 Monocryl.  Incisions cleaned and dried and Steri-Strips and a bulky sterile dressing applied. He was then placed into a knee immobilizer, awakened and transported to recovery in stable condition.  Note that, a surgical assistant was a medical necessity for this procedure to perform it in a safe and expeditious manner.  The cyst was necessary for retraction of vital ligaments and neurovascular structures, as well as for exposure to safely remove the old  implants and properly align the limb for placement of the new implants.     Gaynelle Arabian, M.D.     FA/MEDQ  D:  08/06/2013  T:  08/06/2013  Job:  [id number]

## 2013-08-06 NOTE — Progress Notes (Signed)
Clinical Social Work Department BRIEF PSYCHOSOCIAL ASSESSMENT 08/06/2013  Patient:  Michael [name],Michael [name]     Account Number:  [id number]     Admit date:  08/06/2013  Clinical Social Worker:  ,, LCSW  Date/Time:  08/06/2013 01:55 PM  Referred by:  Physician  Date Referred:  08/06/2013 Referred for  SNF Placement   Other Referral:   Interview type:  Patient Other interview type:    PSYCHOSOCIAL DATA Living Status:  ALONE Admitted from facility:   Level of care:   Primary support name:  Wendy Roberts Primary support relationship to patient:  CHILD, ADULT Degree of support available:   supportive    CURRENT CONCERNS Current Concerns  Post-Acute Placement   Other Concerns:    SOCIAL WORK ASSESSMENT / PLAN Pt is a 80 yr old gentleman living at home, alone, prior to hospitalization. This is a planned surgery. CSW met with pt to assist with d/c planning. ST Rehab will be needed following hospital d/c. Pt has made arrangements with [facility] for placement following hospital d/c. CSW has contacted SNF and d/c plan has been confirmed. CSW will follow to assist with d/c planning needs.   Assessment/plan status:  Psychosocial Support/Ongoing Assessment of Needs Other assessment/ plan:   Information/referral to community resources:   Insurance coverage for SNF and ambulance transport reviewed.    PATIENT'S/FAMILY'S RESPONSE TO PLAN OF CARE: Pt states he is feeling good for someone  just having surgery. No complaints of pain. Pt has been to [facility] in the past for rehab and is looking forward to returning. Pt states his daughter is a nurse at [facility] and will be close enough to visit often. Pt is motivated to begin therapy.     LCSW [phone] 

## 2013-08-06 NOTE — Addendum Note (Signed)
Addendum created 08/06/13 1131 by Lind Covert, CRNA   Modules edited: Anesthesia Medication Administration

## 2013-08-07 ENCOUNTER — Encounter (HOSPITAL_COMMUNITY): Payer: Self-pay | Admitting: Orthopedic Surgery

## 2013-08-07 DIAGNOSIS — D62 Acute posthemorrhagic anemia: Secondary | ICD-10-CM | POA: Diagnosis not present

## 2013-08-07 LAB — CBC
HCT: 25.1 % — ABNORMAL LOW (ref 39.0–52.0)
Hemoglobin: 8.5 g/dL — ABNORMAL LOW (ref 13.0–17.0)
MCH: 32.1 pg (ref 26.0–34.0)
MCHC: 33.9 g/dL (ref 30.0–36.0)
MCV: 94.7 fL (ref 78.0–100.0)
PLATELETS: 189 10*3/uL (ref 150–400)
RBC: 2.65 MIL/uL — ABNORMAL LOW (ref 4.22–5.81)
RDW: 14.3 % (ref 11.5–15.5)
WBC: 17.3 10*3/uL — AB (ref 4.0–10.5)

## 2013-08-07 LAB — BASIC METABOLIC PANEL
BUN: 15 mg/dL (ref 6–23)
CO2: 22 mEq/L (ref 19–32)
CREATININE: 1.12 mg/dL (ref 0.50–1.35)
Calcium: 8.4 mg/dL (ref 8.4–10.5)
Chloride: 100 mEq/L (ref 96–112)
GFR calc Af Amer: 70 mL/min — ABNORMAL LOW (ref >90)
GFR calc non Af Amer: 61 mL/min — ABNORMAL LOW (ref >90)
Glucose, Bld: 135 mg/dL — ABNORMAL HIGH (ref 70–99)
Potassium: 4.5 mEq/L (ref 3.7–5.3)
Sodium: 133 mEq/L — ABNORMAL LOW (ref 137–147)

## 2013-08-07 MED ORDER — ALUM & MAG HYDROXIDE-SIMETH 200-200-20 MG/5ML PO SUSP
30.0000 mL | ORAL | Status: DC | PRN
  Administered 2013-08-07: 30 mL via ORAL
  Filled 2013-08-07: qty 30

## 2013-08-07 NOTE — Care Management Note (Signed)
    Page 1 of 1   08/07/2013     1:27:18 PM   CARE MANAGEMENT NOTE 08/07/2013  Patient:  Michael [name], Michael [name]   Account Number:  [id number]  Date Initiated:  08/07/2013  Documentation initiated by:  [facility]  Subjective/Objective Assessment:   80 year old male admitted s/p   1. Revision of right knee unicompartmental replacement.  2. Total knee arthroplasty.     Action/Plan:   SNF at d/c.   Anticipated DC Date:  08/10/2013   Anticipated DC Plan:  SKILLED NURSING FACILITY  In-house referral  Clinical Social Worker      DC Planning Services  CM consult      Choice offered to / List presented to:             Status of service:  Completed, signed off Medicare Important Message given?  NA - LOS <3 / Initial given by admissions (If response is "NO", the following Medicare IM given date fields will be blank) Date Medicare IM given:   Date Additional Medicare IM given:    Discharge Disposition:  [place]  Per UR Regulation:  Reviewed for med. necessity/level of care/duration of stay  If discussed at [place] of Stay Meetings, dates discussed:    Comments:

## 2013-08-07 NOTE — Discharge Summary (Signed)
Physician Discharge Summary   Patient ID: Michael [name] MRN: [id number] DOB/AGE: 78-Nov-1933 78 y.o.  Admit date: 08/06/2013 Discharge date: 08/09/2013  Primary Diagnosis: Osteoarthritis, right knee   Pain due to a unicompartmental knee arthroplasty   S/P right total knee arthroplasty  Admission Diagnoses:  Past Medical History  Diagnosis Date  . Hyperthyroidism 1978    Status post radioactive iodine treatment  . History of herpes zoster 2001  . Vitreous hemorrhage MAY 2009     RIGHT EYE - NO SURGERY - PT STATES VISION OK  . Peripheral arterial disease   . Mixed hyperlipidemia   . Essential hypertension, benign   . Allergic rhinitis   . COPD (chronic obstructive pulmonary disease)   . History of asbestos exposure   . Depression   . Anemia   . Carotid artery occlusion   . History of myocardial infarction   . Coronary atherosclerosis of native coronary artery     BMS RCA 2003, DES RCA 2004, DES RCA 2005   . GERD (gastroesophageal reflux disease)   . Hemorrhoids   . Head injury, closed, with brief LOC   . HOH (hard of hearing)   . Hypothyroidism   . Lumbar pain     HX OF EPIDURAL STEROID INJECTIONS - DDD  . Psoriasis   . Prostate cancer     Status post XRT/IMRT 2008;  PT STATES HIS PROSTATE CANCER IS BACK - PSA ELEVATED - UROLOGIST IS DR. BORDEN  . Renal cancer     Cryoablation in 2012  . Pneumonia     NOV 2014  . Shortness of breath    Discharge Diagnoses:   Principal Problem:   Pain due to unicompartmental arthroplasty of knee Active Problems:   Postoperative anemia due to acute blood loss  Estimated body mass index is 23.89 kg/(m^2) as calculated from the following:   Height as of this encounter: '5\' 8"'  (1.727 m).   Weight as of this encounter: 71.255 kg (157 lb 1.4 oz).  Procedure:  Procedure(s) (LRB): REVISION RIGHT TOTAL KNEE ARTHROPLASTY   (Right)   Consults: None  HPI: The patient is a 80 year old male who presents for follow up of their knee. The  patient is being followed for their right knee pain and s/p total uni knee 9 years ago. Symptoms reported today include: pain (medial side, unable to go up steps due to pain, worse if twists knee) and swelling (medial side). The patient feels that they are doing poorly. The patient has reported improvement of their symptoms with: Cortisone injections (last one done 08/2012). The patient indicates that they have questions or concerns today regarding pain and their progress at this point (options for pain relief).  [name] has had worsening problems now with the right knee. We had previously replaced his left knee and he has done well with that. He had a right knee unicompartment replacement many years ago and is having increasing difficulty. I gave him a cortisone injection back in the spring and he did do well with that for about a month or so. Unfortunately, the pain has come back as bad as ever. He is at a stage now where he is tired of dealing with this and wants to do what it will take to get this better. He is ready to proceed with converting his previous Uni Knee over to a Total Knee Replacement.  They have been treated conservatively in the past for the above stated problem and despite conservative measures,  they continue to have progressive pain and severe functional limitations and dysfunction. They have failed non-operative management including home exercise, medications, and injections. It is felt that they would benefit from undergoing conversion to a total joint replacement. Risks and benefits of the procedure have been discussed with the patient and they elect to proceed with surgery. There are no active contraindications to surgery such as ongoing infection or rapidly progressive neurological disease.     Laboratory Data: Admission on 08/06/2013  Component Date Value Ref Range Status  . WBC 08/07/2013 17.3* 4.0 - 10.5 K/uL Final  . RBC 08/07/2013 2.65* 4.22 - 5.81 MIL/uL Final  .  Hemoglobin 08/07/2013 8.5* 13.0 - 17.0 g/dL Final  . HCT 08/07/2013 25.1* 39.0 - 52.0 % Final  . MCV 08/07/2013 94.7  78.0 - 100.0 fL Final  . MCH 08/07/2013 32.1  26.0 - 34.0 pg Final  . MCHC 08/07/2013 33.9  30.0 - 36.0 g/dL Final  . RDW 08/07/2013 14.3  11.5 - 15.5 % Final  . Platelets 08/07/2013 189  150 - 400 K/uL Final  . Sodium 08/07/2013 133* 137 - 147 mEq/L Final  . Potassium 08/07/2013 4.5  3.7 - 5.3 mEq/L Final  . Chloride 08/07/2013 100  96 - 112 mEq/L Final  . CO2 08/07/2013 22  19 - 32 mEq/L Final  . Glucose, Bld 08/07/2013 135* 70 - 99 mg/dL Final  . BUN 08/07/2013 15  6 - 23 mg/dL Final  . Creatinine, Ser 08/07/2013 1.12  0.50 - 1.35 mg/dL Final  . Calcium 08/07/2013 8.4  8.4 - 10.5 mg/dL Final  . GFR calc non Af Amer 08/07/2013 61* >90 mL/min Final  . GFR calc Af Amer 08/07/2013 70* >90 mL/min Final   Comment: (NOTE)                          The eGFR has been calculated using the CKD EPI equation.                          This calculation has not been validated in all clinical situations.                          eGFR's persistently <90 mL/min signify possible Chronic Kidney                          Disease.  Hospital Outpatient Visit on 07/30/2013  Component Date Value Ref Range Status  . MRSA, PCR 07/30/2013 NEGATIVE  NEGATIVE Final  . Staphylococcus aureus 07/30/2013 NEGATIVE  NEGATIVE Final   Comment:                                 The Xpert SA Assay (FDA                          approved for NASAL specimens                          in patients over 20 years of age),                          is one component of  a comprehensive surveillance                          program.  Test performance has                          been validated by [facility] for patients greater                          than or equal to 21 year old.                          It is not intended                          to diagnose  infection nor to                          guide or monitor treatment.  Marland Kitchen aPTT 07/30/2013 38* 24 - 37 seconds Final   Comment:                                 IF BASELINE aPTT IS ELEVATED,                          SUGGEST PATIENT RISK ASSESSMENT                          BE USED TO DETERMINE APPROPRIATE                          ANTICOAGULANT THERAPY.  . WBC 07/30/2013 8.5  4.0 - 10.5 K/uL Final  . RBC 07/30/2013 3.98* 4.22 - 5.81 MIL/uL Final  . Hemoglobin 07/30/2013 13.0  13.0 - 17.0 g/dL Final  . HCT 07/30/2013 37.8* 39.0 - 52.0 % Final  . MCV 07/30/2013 95.0  78.0 - 100.0 fL Final  . MCH 07/30/2013 32.7  26.0 - 34.0 pg Final  . MCHC 07/30/2013 34.4  30.0 - 36.0 g/dL Final  . RDW 07/30/2013 14.4  11.5 - 15.5 % Final  . Platelets 07/30/2013 221  150 - 400 K/uL Final  . Sodium 07/30/2013 134* 137 - 147 mEq/L Final  . Potassium 07/30/2013 4.4  3.7 - 5.3 mEq/L Final  . Chloride 07/30/2013 98  96 - 112 mEq/L Final  . CO2 07/30/2013 26  19 - 32 mEq/L Final  . Glucose, Bld 07/30/2013 104* 70 - 99 mg/dL Final  . BUN 07/30/2013 12  6 - 23 mg/dL Final  . Creatinine, Ser 07/30/2013 1.19  0.50 - 1.35 mg/dL Final  . Calcium 07/30/2013 9.5  8.4 - 10.5 mg/dL Final  . Total Protein 07/30/2013 6.6  6.0 - 8.3 g/dL Final  . Albumin 07/30/2013 3.3* 3.5 - 5.2 g/dL Final  . AST 07/30/2013 16  0 - 37 U/L Final  . ALT 07/30/2013 10  0 - 53 U/L Final  . Alkaline Phosphatase 07/30/2013 73  39 - 117 U/L Final  . Total Bilirubin 07/30/2013 0.3  0.3 - 1.2 mg/dL  Final  . GFR calc non Af Amer 07/30/2013 56* >90 mL/min Final  . GFR calc Af Amer 07/30/2013 65* >90 mL/min Final   Comment: (NOTE)                          The eGFR has been calculated using the CKD EPI equation.                          This calculation has not been validated in all clinical situations.                          eGFR's persistently <90 mL/min signify possible Chronic Kidney                          Disease.  Marland Kitchen Prothrombin Time  07/30/2013 12.7  11.6 - 15.2 seconds Final  . INR 07/30/2013 0.97  0.00 - 1.49 Final  . ABO/RH(D) 07/30/2013 O POS   Final  . Antibody Screen 07/30/2013 NEG   Final  . Sample Expiration 07/30/2013 08/09/2013   Final  . Color, Urine 07/30/2013 YELLOW  YELLOW Final  . APPearance 07/30/2013 CLEAR  CLEAR Final  . Specific Gravity, Urine 07/30/2013 1.015  1.005 - 1.030 Final  . pH 07/30/2013 6.5  5.0 - 8.0 Final  . Glucose, UA 07/30/2013 NEGATIVE  NEGATIVE mg/dL Final  . Hgb urine dipstick 07/30/2013 NEGATIVE  NEGATIVE Final  . Bilirubin Urine 07/30/2013 NEGATIVE  NEGATIVE Final  . Ketones, ur 07/30/2013 NEGATIVE  NEGATIVE mg/dL Final  . Protein, ur 07/30/2013 NEGATIVE  NEGATIVE mg/dL Final  . Urobilinogen, UA 07/30/2013 0.2  0.0 - 1.0 mg/dL Final  . Nitrite 07/30/2013 NEGATIVE  NEGATIVE Final  . Leukocytes, UA 07/30/2013 NEGATIVE  NEGATIVE Final   MICROSCOPIC NOT DONE ON URINES WITH NEGATIVE PROTEIN, BLOOD, LEUKOCYTES, NITRITE, OR GLUCOSE <1000 mg/dL.     X-Rays:No results found.  EKG: Orders placed in visit on 06/18/13  . EKG 12-LEAD     Hospital Course: OLUWATOBILOBA MARTIN is a 80 y.o. who was admitted to [facility]. They were brought to the operating room on 08/06/2013 and underwent Procedure(s): REVISION RIGHT TOTAL KNEE ARTHROPLASTY  .  Patient tolerated the procedure well and was later transferred to the recovery room and then to the orthopaedic floor for postoperative care.  They were given PO and IV analgesics for pain control following their surgery.  They were given 24 hours of postoperative antibiotics of  Anti-infectives   Start     Dose/Rate Route Frequency Ordered Stop   08/06/13 2000  vancomycin (VANCOCIN) IVPB 1000 mg/200 mL premix     1,000 mg 200 mL/hr over 60 Minutes Intravenous Every 12 hours 08/06/13 1057 08/06/13 2040   08/06/13 0710  vancomycin (VANCOCIN) IVPB 1000 mg/200 mL premix     1,000 mg 200 mL/hr over 60 Minutes Intravenous On call to O.R. 08/06/13  0710 08/06/13 0825     and started on DVT prophylaxis in the form of Xarelto.   PT and OT were ordered for total joint protocol.  Discharge planning consulted to help with postop disposition and equipment needs.  Patient had a fair night on the evening of surgery.  They started to get up OOB with therapy on day one. Hemovac drain was pulled without difficulty.  Continued to work with therapy into day two.  Dressing was changed on day two and the incision was clean and dry. Patient developed ABLA and received 2 units of blood on post op day two. Plan at time of discharge summary was for patient to continue PT Friday and Saturday before discharge to [facility] Saturday.   Discharge Medications: Prior to Admission medications   Medication Sig Start Date End Date Taking? Authorizing Provider  albuterol (PROAIR HFA) 108 (90 BASE) MCG/ACT inhaler Inhale 2 puffs into the lungs every 6 (six) hours as needed. For shortness of breath   Yes Historical Provider, MD  amLODipine (NORVASC) 10 MG tablet Take 10 mg by mouth every morning.    Yes Historical Provider, MD  atorvastatin (LIPITOR) 40 MG tablet Take 40 mg by mouth at bedtime.    Yes Historical Provider, MD  cetirizine (ZYRTEC) 10 MG tablet Take 10 mg by mouth daily.    Yes Historical Provider, MD  citalopram (CELEXA) 20 MG tablet Take 20 mg by mouth every morning.    Yes Historical Provider, MD  clobetasol cream (TEMOVATE) 3.15 % Apply 1 application topically daily. Applied to legs for psoriasis   Yes Historical Provider, MD  ferrous sulfate 325 (65 FE) MG tablet Take 325 mg by mouth at bedtime.    Yes Historical Provider, MD  Fluticasone-Salmeterol (ADVAIR DISKUS) 250-50 MCG/DOSE AEPB Inhale 1 puff into the lungs every 12 (twelve) hours.     Yes Historical Provider, MD  isosorbide mononitrate (IMDUR) 60 MG 24 hr tablet Take 1 tablet (60 mg total) by mouth 2 (two) times daily. 06/18/13  Yes Satira Sark, MD  levothyroxine (SYNTHROID, LEVOTHROID) 125 MCG  tablet Take 125 mcg by mouth every morning.    Yes Historical Provider, MD  mometasone (NASONEX) 50 MCG/ACT nasal spray Place 2 sprays into the nose at bedtime. 11/24/10  Yes Deneise Lever, MD  ranitidine (ZANTAC) 150 MG tablet Take 150 mg by mouth every morning.    Yes Historical Provider, MD  Tamsulosin HCl (FLOMAX) 0.4 MG CAPS Take 0.4 mg by mouth at bedtime.    Yes Historical Provider, MD  theophylline (THEODUR) 200 MG 12 hr tablet Take 100 mg by mouth 2 (two) times daily.    Yes Historical Provider, MD  tiotropium (SPIRIVA) 18 MCG inhalation capsule Place 18 mcg into inhaler and inhale daily. 08/01/12 08/15/13 Yes Clinton D Young, MD  traZODone (DESYREL) 100 MG tablet Take 100 mg by mouth at bedtime as needed. For sleep   Yes Historical Provider, MD  docusate sodium 100 MG CAPS Take 100 mg by mouth 2 (two) times daily. 08/06/13   Kohen Reither Renelda Loma, PA-C  HYDROmorphone (DILAUDID) 2 MG tablet Take 1-2 tablets (2-4 mg total) by mouth every 4 (four) hours as needed for severe pain. 08/06/13   Itali Mckendry Renelda Loma, PA-C  methocarbamol (ROBAXIN) 500 MG tablet Take 1 tablet (500 mg total) by mouth every 6 (six) hours as needed for muscle spasms. 08/06/13   Oney Folz Renelda Loma, PA-C  nitroGLYCERIN (NITROSTAT) 0.4 MG SL tablet Place 0.4 mg under the tongue every 5 (five) minutes as needed. For chest pain 08/23/11   Donney Dice, PA-C  polyethylene glycol (MIRALAX / GLYCOLAX) packet Take 17 g by mouth daily as needed for mild constipation. 08/06/13   Yeny Schmoll Renelda Loma, PA-C  rivaroxaban (XARELTO) 10 MG TABS tablet Take 1 tablet (10 mg total) by mouth daily with breakfast. 08/07/13   Mieshia Pepitone Renelda Loma, PA-C  traMADol (ULTRAM) 50 MG tablet Take 1-2 tablets (50-100 mg total)  by mouth every 6 (six) hours as needed for moderate pain. 08/06/13   Summit Borchardt Renelda Loma, PA-C    Diet: Cardiac diet Activity:WBAT Follow-up:in 2 weeks Disposition - Skilled nursing facility Discharged Condition:  stable   Discharge Orders   Future Appointments Provider Department Dept Phone   10/07/2013 4:00 PM Deneise Lever, MD [facility] [phone]   10/17/2013 4:20 PM Satira Sark, MD [facility] [phone]   01/09/2014 3:00 PM [facility] [phone]   01/09/2014 4:00 PM Conrad Calypso, MD [facility] [phone]   Future Orders Complete By Expires   Call MD / Call 911  As directed    Change dressing  As directed    Constipation Prevention  As directed    Diet - low sodium heart healthy  As directed    Discharge instructions  As directed    Do not put a pillow under the knee. Place it under the heel.  As directed    Driving restrictions  As directed    Increase activity slowly as tolerated  As directed        Medication List    STOP taking these medications       aspirin EC 81 MG tablet     Fish Oil 1200 MG Caps     multivitamin with minerals Tabs tablet      TAKE these medications       ADVAIR DISKUS 250-50 MCG/DOSE Aepb  Generic drug:  Fluticasone-Salmeterol  Inhale 1 puff into the lungs every 12 (twelve) hours.     amLODipine 10 MG tablet  Commonly known as:  NORVASC  Take 10 mg by mouth every morning.     atorvastatin 40 MG tablet  Commonly known as:  LIPITOR  Take 40 mg by mouth at bedtime.     cetirizine 10 MG tablet  Commonly known as:  ZYRTEC  Take 10 mg by mouth daily.     citalopram 20 MG tablet  Commonly known as:  CELEXA  Take 20 mg by mouth every morning.     clobetasol cream 0.05 %  Commonly known as:  TEMOVATE  Apply 1 application topically daily. Applied to legs for psoriasis     DSS 100 MG Caps  Take 100 mg by mouth 2 (two) times daily.     ferrous sulfate 325 (65 FE) MG tablet  Take 325 mg by mouth at bedtime.     FLOMAX 0.4 MG Caps capsule  Generic drug:  tamsulosin  Take 0.4 mg by mouth at bedtime.     HYDROmorphone 2  MG tablet  Commonly known as:  DILAUDID  Take 1-2 tablets (2-4 mg total) by mouth every 4 (four) hours as needed for severe pain.     isosorbide mononitrate 60 MG 24 hr tablet  Commonly known as:  IMDUR  Take 1 tablet (60 mg total) by mouth 2 (two) times daily.     levothyroxine 125 MCG tablet  Commonly known as:  SYNTHROID, LEVOTHROID  Take 125 mcg by mouth every morning.     methocarbamol 500 MG tablet  Commonly known as:  ROBAXIN  Take 1 tablet (500 mg total) by mouth every 6 (six) hours as needed for muscle spasms.     mometasone 50 MCG/ACT nasal spray  Commonly known as:  NASONEX  Place 2 sprays into the nose at bedtime.     nitroGLYCERIN 0.4 MG SL tablet  Commonly known as:  NITROSTAT  Place 0.4 mg under the tongue every 5 (five) minutes as needed. For chest pain     polyethylene glycol packet  Commonly known as:  MIRALAX / GLYCOLAX  Take 17 g by mouth daily as needed for mild constipation.     PROAIR HFA 108 (90 BASE) MCG/ACT inhaler  Generic drug:  albuterol  Inhale 2 puffs into the lungs every 6 (six) hours as needed. For shortness of breath     ranitidine 150 MG tablet  Commonly known as:  ZANTAC  Take 150 mg by mouth every morning.     rivaroxaban 10 MG Tabs tablet  Commonly known as:  XARELTO  Take 1 tablet (10 mg total) by mouth daily with breakfast.     theophylline 200 MG 12 hr tablet  Commonly known as:  THEODUR  Take 100 mg by mouth 2 (two) times daily.     tiotropium 18 MCG inhalation capsule  Commonly known as:  SPIRIVA  Place 18 mcg into inhaler and inhale daily.     traMADol 50 MG tablet  Commonly known as:  ULTRAM  Take 1-2 tablets (50-100 mg total) by mouth every 6 (six) hours as needed for moderate pain.     traZODone 100 MG tablet  Commonly known as:  DESYREL  Take 100 mg by mouth at bedtime as needed. For sleep           Follow-up Information   Follow up with Gearlean Alf, MD. Schedule an appointment as soon as possible for a  visit in 2 weeks.   Specialty:  Orthopedic Surgery   Contact information:   [address] [place] [address] [phone]       Signed: Malachy Chamber 08/07/2013, 9:26 PM

## 2013-08-07 NOTE — Progress Notes (Signed)
Physical Therapy Treatment Patient Details Name: Michael [name] MRN: [id number] DOB: 1931/09/01 Today's Date: 08/07/2013    History of Present Illness uni knee revised to totoal R    PT Comments    progressing well. Able to ambulate w/out KI. Cues for safety.  Follow Up Recommendations  SNF     Equipment Recommendations  None recommended by PT    Recommendations for Other Services       Precautions / Restrictions Precautions Precautions: Knee;Fall Precaution Comments: no need for KI Restrictions Weight Bearing Restrictions: No    Mobility  Bed Mobility Overal bed mobility: Modified Independent       Supine to sit: Modified independent (Device/Increase time)        Transfers   Equipment used: Rolling walker (2 wheeled) Transfers: Sit to/from Stand Sit to Stand: Min guard         General transfer comment: cues for safety  Ambulation/Gait Ambulation/Gait assistance: Min guard Ambulation Distance (Feet): 150 Feet Assistive device: Rolling walker (2 wheeled) Gait Pattern/deviations: Step-through pattern     General Gait Details: cues for safety inside RW,   Stairs            Wheelchair Mobility    Modified Rankin (Stroke Patients Only)       Balance                                    Cognition Arousal/Alertness: Awake/alert                          Exercises Total Joint Exercises Quad Sets: AROM;Right;15 reps;Supine Short Arc Quad: AROM;Right;15 reps;Supine Heel Slides: AROM;Right;15 reps;Supine Straight Leg Raises: AROM;Right;15 reps;Supine Goniometric ROM: 10-90    General Comments        Pertinent Vitals/Pain No pain    Home Living                      Prior Function            PT Goals (current goals can now be found in the care plan section) Progress towards PT goals: Progressing toward goals    Frequency  7X/week    PT Plan Current plan remains appropriate     Co-evaluation             End of Session   Activity Tolerance: Patient tolerated treatment well Patient left: in chair;with call bell/phone within reach;with family/visitor present     Time: 0300-9233 PT Time Calculation (min): 31 min  Charges:  $Gait Training: 8-22 mins  Exercises 8-22                   G Codes:      Claretha Cooper 08/07/2013, 9:32 AM

## 2013-08-07 NOTE — Progress Notes (Signed)
Physical Therapy Treatment Patient Details Name: Michael [name] MRN: [id number] DOB: 30-Dec-1931 Today's Date: 08/07/2013    History of Present Illness uni knee revised to TKR    PT Comments    POD # 1 pm session.  Assisted pt out of recliner to amb in hallway.  Pt moves well but required cueing for safety and proper walker to self distance.  Pt also "parked" walker to side as he moved to get back into bed.  VC's to use walker throughout transition for increases safety.  Assisted back to bed for CPM.  Follow Up Recommendations  SNF     Equipment Recommendations  None recommended by PT    Recommendations for Other Services       Precautions / Restrictions Precautions Precautions: Knee;Fall Precaution Comments: no need for KI Restrictions Weight Bearing Restrictions: No    Mobility  Bed Mobility Overal bed mobility: Modified Independent             General bed mobility comments: ibcreased time  Transfers Overall transfer level: Needs assistance Equipment used: Rolling walker (2 wheeled)   Sit to Stand: Min guard         General transfer comment: cues for safety esp to use RW throughout turn as pt parked RW to side  Ambulation/Gait Ambulation/Gait assistance: Min guard Ambulation Distance (Feet): 155 Feet Assistive device: Rolling walker (2 wheeled) Gait Pattern/deviations: Step-through pattern;Decreased stance time - right Gait velocity: decreased   General Gait Details: cues for safety inside RW esp with turns   Stairs            Wheelchair Mobility    Modified Rankin (Stroke Patients Only)       Balance                                    Cognition                            Exercises      General Comments        Pertinent Vitals/Pain     Home Living                      Prior Function            PT Goals (current goals can now be found in the care plan section) Progress towards PT  goals: Progressing toward goals    Frequency       PT Plan Current plan remains appropriate    Co-evaluation             End of Session Equipment Utilized During Treatment: Gait belt Activity Tolerance: Patient tolerated treatment well Patient left: in bed;with call bell/phone within reach;with family/visitor present     Time: 1414-1436 PT Time Calculation (min): 22 min  Charges:  $Gait Training: 8-22 mins                    G Codes:      Rica Koyanagi  PTA [facility]  Acute  Rehab Pager      [phone]

## 2013-08-07 NOTE — Discharge Instructions (Addendum)
Dr. Gaynelle Arabian Total Joint Specialist [facility] [address] [place], [place] [address] [phone]  TOTAL KNEE REPLACEMENT POSTOPERATIVE DIRECTIONS    Knee Rehabilitation, Guidelines Following Surgery  Results after knee surgery are often greatly improved when you follow the exercise, range of motion and muscle strengthening exercises prescribed by your doctor. Safety measures are also important to protect the knee from further injury. Any time any of these exercises cause you to have increased pain or swelling in your knee joint, decrease the amount until you are comfortable again and slowly increase them. If you have problems or questions, call your caregiver or physical therapist for advice.   HOME CARE INSTRUCTIONS  Remove items at home which could result in a fall. This includes throw rugs or furniture in walking pathways.  Continue medications as instructed at time of discharge. You may have some home medications which will be placed on hold until you complete the course of blood thinner medication.  You may start showering once you are discharged home but do not submerge the incision under water. Just pat the incision dry and apply a dry gauze dressing on daily. Walk with walker as instructed.  You may resume a sexual relationship in one month or when given the OK by  your doctor.   Use walker as long as suggested by your caregivers.  Avoid periods of inactivity such as sitting longer than an hour when not asleep. This helps prevent blood clots.  You may put full weight on your legs and walk as much as is comfortable.  You may return to work once you are cleared by your doctor.  Do not drive a car for 6 weeks or until released by you surgeon.   Do not drive while taking narcotics.  Wear the elastic stockings for three weeks following surgery during the day but you may remove then at night. Make sure you keep all of your appointments after your  operation with all of your doctors and caregivers. You should call the office at the above phone number and make an appointment for approximately two weeks after the date of your surgery. Change the dressing daily and reapply a dry dressing each time. Please pick up a stool softener and laxative for home use as long as you are requiring pain medications.  Continue to use ice on the knee for pain and swelling from surgery. You may notice swelling that will progress down to the foot and ankle.  This is normal after surgery.  Elevate the leg when you are not up walking on it.   It is important for you to complete the blood thinner medication as prescribed by your doctor.  Continue to use the breathing machine which will help keep your temperature down.  It is common for your temperature to cycle up and down following surgery, especially at night when you are not up moving around and exerting yourself.  The breathing machine keeps your lungs expanded and your temperature down.  RANGE OF MOTION AND STRENGTHENING EXERCISES  Rehabilitation of the knee is important following a knee injury or an operation. After just a few days of immobilization, the muscles of the thigh which control the knee become weakened and shrink (atrophy). Knee exercises are designed to build up the tone and strength of the thigh muscles and to improve knee motion. Often times heat used for twenty to thirty minutes before working out will loosen up your tissues and help with improving the  range of motion but do not use heat for the first two weeks following surgery. These exercises can be done on a training (exercise) mat, on the floor, on a table or on a bed. Use what ever works the best and is most comfortable for you Knee exercises include:  Leg Lifts - While your knee is still immobilized in a splint or cast, you can do straight leg raises. Lift the leg to 60 degrees, hold for 3 sec, and slowly lower the leg. Repeat 10-20 times 2-3  times daily. Perform this exercise against resistance later as your knee gets better.  Quad and Hamstring Sets - Tighten up the muscle on the front of the thigh (Quad) and hold for 5-10 sec. Repeat this 10-20 times hourly. Hamstring sets are done by pushing the foot backward against an object and holding for 5-10 sec. Repeat as with quad sets.  A rehabilitation program following serious knee injuries can speed recovery and prevent re-injury in the future due to weakened muscles. Contact your doctor or a physical therapist for more information on knee rehabilitation.   SKILLED REHAB INSTRUCTIONS: If the patient is transferred to a skilled rehab facility following release from the hospital, a list of the current medications will be sent to the facility for the patient to continue.  When discharged from the skilled rehab facility, please have the facility set up the patient's [place] prior to being released. Also, the skilled facility will be responsible for providing the patient with their medications at time of release from the facility to include their pain medication, the muscle relaxants, and their blood thinner medication. If the patient is still at the rehab facility at time of the two week follow up appointment, the skilled rehab facility will also need to assist the patient in arranging follow up appointment in our office and any transportation needs.  MAKE SURE YOU:  Understand these instructions.  Will watch your condition.  Will get help right away if you are not doing well or get worse.    Pick up stool softner and laxative for home. Do not submerge incision under water. May shower. Continue to use ice for pain and swelling from surgery.           Information on my medicine - XARELTO (Rivaroxaban)  This medication education was reviewed with me or my healthcare representative as part of my discharge preparation.  The pharmacist that spoke with me during my  hospital stay was:  Clovis Riley, [facility]  Why was Xarelto prescribed for you? Xarelto was prescribed for you to reduce the risk of blood clots forming after orthopedic surgery. The medical term for these abnormal blood clots is venous thromboembolism (VTE).  What do you need to know about xarelto ? Take your Xarelto ONCE DAILY at the same time every day. You may take it either with or without food.  If you have difficulty swallowing the tablet whole, you may crush it and mix in applesauce just prior to taking your dose.  Take Xarelto exactly as prescribed by your doctor and DO NOT stop taking Xarelto without talking to the doctor who prescribed the medication.  Stopping without other VTE prevention medication to take the place of Xarelto may increase your risk of developing a clot.  After discharge, you should have regular check-up appointments with your healthcare provider that is prescribing your Xarelto.    What do you do if you miss a dose? If you miss a  dose, take it as soon as you remember on the same day then continue your regularly scheduled once daily regimen the next day. Do not take two doses of Xarelto on the same day.   Important Safety Information A possible side effect of Xarelto is bleeding. You should call your healthcare provider right away if you experience any of the following:   Bleeding from an injury or your nose that does not stop.   Unusual colored urine (red or dark brown) or unusual colored stools (red or black).   Unusual bruising for unknown reasons.   A serious fall or if you hit your head (even if there is no bleeding).  Some medicines may interact with Xarelto and might increase your risk of bleeding while on Xarelto. To help avoid this, consult your healthcare provider or pharmacist prior to using any new prescription or non-prescription medications, including herbals, vitamins, non-steroidal anti-inflammatory drugs (NSAIDs) and  supplements.  This website has more information on Xarelto: [email or web address]

## 2013-08-07 NOTE — Progress Notes (Signed)
   Subjective: 1 Day Post-Op Procedure(s) (LRB): REVISION RIGHT TOTAL KNEE ARTHROPLASTY   (Right) Patient reports pain as mild.    Plan is to go Skilled nursing facility after hospital stay.  Objective: Vital signs in last 24 hours: Temp:  [97.4 F (36.3 C)-98.8 F (37.1 C)] 98 F (36.7 C) (04/09 0603) Pulse Rate:  [59-96] 81 (04/09 0603) Resp:  [15-18] 16 (04/09 0751) BP: (101-125)/(53-68) 105/53 mmHg (04/09 0603) SpO2:  [90 %-100 %] 96 % (04/09 0603) Weight:  [157 lb 1.4 oz (71.255 kg)] 157 lb 1.4 oz (71.255 kg) (04/08 1102)  Intake/Output from previous day:  Intake/Output Summary (Last 24 hours) at 08/07/13 0752 Last data filed at 08/07/13 0545  Gross per 24 hour  Intake 3391.25 ml  Output   2975 ml  Net 416.25 ml    Intake/Output this shift:    Labs:  Recent Labs  08/07/13 0422  HGB 8.5*    Recent Labs  08/07/13 0422  WBC 17.3*  RBC 2.65*  HCT 25.1*  PLT 189    Recent Labs  08/07/13 0422  NA 133*  K 4.5  CL 100  CO2 22  BUN 15  CREATININE 1.12  GLUCOSE 135*  CALCIUM 8.4   No results found for this basename: LABPT, INR,  in the last 72 hours  EXAM General - Patient is Alert, Appropriate and Oriented Extremity - Neurologically intact Neurovascular intact Dressing - dressing C/D/I Motor Function - intact, moving foot and toes well on exam.  Hemovac pulled without difficulty.  Past Medical History  Diagnosis Date  . Hyperthyroidism 1978    Status post radioactive iodine treatment  . History of herpes zoster 2001  . Vitreous hemorrhage MAY 2009     RIGHT EYE - NO SURGERY - PT STATES VISION OK  . Peripheral arterial disease   . Mixed hyperlipidemia   . Essential hypertension, benign   . Allergic rhinitis   . COPD (chronic obstructive pulmonary disease)   . History of asbestos exposure   . Depression   . Anemia   . Carotid artery occlusion   . History of myocardial infarction   . Coronary atherosclerosis of native coronary artery     BMS RCA 2003, DES RCA 2004, DES RCA 2005   . GERD (gastroesophageal reflux disease)   . Hemorrhoids   . Head injury, closed, with brief LOC   . HOH (hard of hearing)   . Hypothyroidism   . Lumbar pain     HX OF EPIDURAL STEROID INJECTIONS - DDD  . Psoriasis   . Prostate cancer     Status post XRT/IMRT 2008;  PT STATES HIS PROSTATE CANCER IS BACK - PSA ELEVATED - UROLOGIST IS DR. BORDEN  . Renal cancer     Cryoablation in 2012  . Pneumonia     NOV 2014  . Shortness of breath     Assessment/Plan: 1 Day Post-Op Procedure(s) (LRB): REVISION RIGHT TOTAL KNEE ARTHROPLASTY   (Right) Principal Problem:   Pain due to unicompartmental arthroplasty of knee   Advance diet Up with therapy D/C IV fluids Acute blood loss anemia- Secondary to surgery. Currently asymptomatic.Will follow  DVT Prophylaxis - Xarelto Weight-Bearing as tolerated to right leg D/C O2 and Pulse OX and try on Room [employer] V Michael [name] 08/07/2013, 7:52 AM

## 2013-08-08 LAB — BASIC METABOLIC PANEL
BUN: 17 mg/dL (ref 6–23)
CHLORIDE: 104 meq/L (ref 96–112)
CO2: 25 mEq/L (ref 19–32)
Calcium: 8.4 mg/dL (ref 8.4–10.5)
Creatinine, Ser: 1.28 mg/dL (ref 0.50–1.35)
GFR calc non Af Amer: 52 mL/min — ABNORMAL LOW (ref >90)
GFR, EST AFRICAN AMERICAN: 60 mL/min — AB (ref >90)
Glucose, Bld: 118 mg/dL — ABNORMAL HIGH (ref 70–99)
POTASSIUM: 4.5 meq/L (ref 3.7–5.3)
SODIUM: 138 meq/L (ref 137–147)

## 2013-08-08 LAB — PREPARE RBC (CROSSMATCH)

## 2013-08-08 LAB — CBC
HEMATOCRIT: 21.9 % — AB (ref 39.0–52.0)
Hemoglobin: 7.6 g/dL — ABNORMAL LOW (ref 13.0–17.0)
MCH: 32.6 pg (ref 26.0–34.0)
MCHC: 34.7 g/dL (ref 30.0–36.0)
MCV: 94 fL (ref 78.0–100.0)
Platelets: 189 10*3/uL (ref 150–400)
RBC: 2.33 MIL/uL — ABNORMAL LOW (ref 4.22–5.81)
RDW: 14.7 % (ref 11.5–15.5)
WBC: 16.8 10*3/uL — AB (ref 4.0–10.5)

## 2013-08-08 MED ORDER — ACETAMINOPHEN 325 MG PO TABS
650.0000 mg | ORAL_TABLET | Freq: Once | ORAL | Status: AC
  Administered 2013-08-08: 650 mg via ORAL
  Filled 2013-08-08: qty 2

## 2013-08-08 NOTE — Progress Notes (Signed)
CSW assisting with d/c planning. Tentative d/c summary sent to [facility] for admission on SAT if pt is stable for d/c. Weekend CSW will assist with d/c planning to SNF.  Werner Lean LCSW [phone]

## 2013-08-08 NOTE — Progress Notes (Signed)
Subjective: 2 Days Post-Op Procedure(s) (LRB): REVISION RIGHT TOTAL KNEE ARTHROPLASTY   (Right) Patient reports pain as mild.   Patient seen in rounds with Dr. [name]. Patient is well, and has had no acute complaints or problems. No issues overnight. Reports that therapy is going well.  Plan is to go Skilled nursing facility after hospital stay.  Objective: Vital signs in last 24 hours: Temp:  [97.3 F (36.3 C)-98.4 F (36.9 C)] 98.2 F (36.8 C) (04/10 0628) Pulse Rate:  [68-129] 94 (04/10 0628) Resp:  [14-18] 18 (04/10 0800) BP: (103-125)/(50-59) 107/55 mmHg (04/10 0628) SpO2:  [90 %-97 %] 90 % (04/10 0628)  Intake/Output from previous day:  Intake/Output Summary (Last 24 hours) at 08/08/13 0811 Last data filed at 08/08/13 0746  Gross per 24 hour  Intake 830.08 ml  Output   1300 ml  Net -469.92 ml    Intake/Output this shift: Total I/O In: 240 [P.O.:240] Out: -   Labs:  Recent Labs  08/07/13 0422 08/08/13 0459  HGB 8.5* 7.6*    Recent Labs  08/07/13 0422 08/08/13 0459  WBC 17.3* 16.8*  RBC 2.65* 2.33*  HCT 25.1* 21.9*  PLT 189 189    Recent Labs  08/07/13 0422 08/08/13 0459  NA 133* 138  K 4.5 4.5  CL 100 104  CO2 22 25  BUN 15 17  CREATININE 1.12 1.28  GLUCOSE 135* 118*  CALCIUM 8.4 8.4    EXAM General - Patient is Alert and Oriented Extremity - Neurologically intact Intact pulses distally Dorsiflexion/Plantar flexion intact No cellulitis present Compartment soft Dressing/Incision - clean, dry, no drainage Motor Function - intact, moving foot and toes well on exam.   Past Medical History  Diagnosis Date  . Hyperthyroidism 1978    Status post radioactive iodine treatment  . History of herpes zoster 2001  . Vitreous hemorrhage MAY 2009     RIGHT EYE - NO SURGERY - PT STATES VISION OK  . Peripheral arterial disease   . Mixed hyperlipidemia   . Essential hypertension, benign   . Allergic rhinitis   . COPD (chronic obstructive  pulmonary disease)   . History of asbestos exposure   . Depression   . Anemia   . Carotid artery occlusion   . History of myocardial infarction   . Coronary atherosclerosis of native coronary artery     BMS RCA 2003, DES RCA 2004, DES RCA 2005   . GERD (gastroesophageal reflux disease)   . Hemorrhoids   . Head injury, closed, with brief LOC   . HOH (hard of hearing)   . Hypothyroidism   . Lumbar pain     HX OF EPIDURAL STEROID INJECTIONS - DDD  . Psoriasis   . Prostate cancer     Status post XRT/IMRT 2008;  PT STATES HIS PROSTATE CANCER IS BACK - PSA ELEVATED - UROLOGIST IS DR. BORDEN  . Renal cancer     Cryoablation in 2012  . Pneumonia     NOV 2014  . Shortness of breath     Assessment/Plan: 2 Days Post-Op Procedure(s) (LRB): REVISION RIGHT TOTAL KNEE ARTHROPLASTY   (Right) Principal Problem:   Pain due to unicompartmental arthroplasty of knee Active Problems:   Postoperative anemia due to acute blood loss  Estimated body mass index is 23.89 kg/(m^2) as calculated from the following:   Height as of this encounter: 5\' 8"  (1.727 m).   Weight as of this encounter: 71.255 kg (157 lb 1.4 oz). Advance diet Up  with therapy Discharge to SNF tomorrow  DVT Prophylaxis - Xarelto Weight-Bearing as tolerated   Will receive 2 units of blood today. Resume PT after completion of transfusion. Plan for DC to SNF tomorrow.   Michael [name] Michael [name] 08/08/2013, 8:11 AM

## 2013-08-08 NOTE — Progress Notes (Signed)
Physical Therapy Treatment Patient Details Name: Michael [name] MRN: [id number] DOB: [birth year] Today's Date: 08/08/2013    History of Present Illness uni knee revised to TKR    PT Comments    Pt requires reminders to not walk away from RW, cues for safety. OK for family transport to SNF. Encouraged elevation of R FOOT TO STRETCH knee extension.  Follow Up Recommendations  SNF     Equipment Recommendations  None recommended by PT    Recommendations for Other Services       Precautions / Restrictions Precautions Precautions: Knee;Fall Precaution Comments: no need for KI    Mobility  Bed Mobility Overal bed mobility: Modified Independent Bed Mobility: Sit to Supine       Sit to supine: Modified independent (Device/Increase time)      Transfers Overall transfer level: Needs assistance Equipment used: Rolling walker (2 wheeled) Transfers: Sit to/from Stand Sit to Stand: Min guard         General transfer comment: cues for safety esp to use RW throughout turn as pt parked RW to side  Ambulation/Gait Ambulation/Gait assistance: Min guard Ambulation Distance (Feet): 400 Feet Assistive device: Rolling walker (2 wheeled) Gait Pattern/deviations: Step-through pattern     General Gait Details: cues for safety inside RW esp with turns   Stairs            Wheelchair Mobility    Modified Rankin (Stroke Patients Only)       Balance                                    Cognition Arousal/Alertness: Awake/alert Behavior During Therapy: Impulsive         Memory: Decreased recall of precautions              Exercises Total Joint Exercises Quad Sets: AROM;Right;15 reps;Supine Short Arc Quad: AROM;Right;15 reps;Supine Heel Slides: AROM;Right;15 reps;Supine Straight Leg Raises: AROM;Right;15 reps;Supine Long Arc Quad: AROM;Right;15 reps;Supine Goniometric ROM: 10-90    General Comments        Pertinent Vitals/Pain No c/o     Home Living                      Prior Function            PT Goals (current goals can now be found in the care plan section) Progress towards PT goals: Progressing toward goals    Frequency  7X/week    PT Plan Current plan remains appropriate    Co-evaluation             End of Session   Activity Tolerance: Patient tolerated treatment well Patient left: in bed;with call bell/phone within reach;with family/visitor present     Time: 9563-8756 PT Time Calculation (min): 29 min  Charges:  $Gait Training: 8-22 mins $Therapeutic Exercise: 8-22 mins                    G Codes:      Claretha Cooper 08/08/2013, 1:00 PM

## 2013-08-09 DIAGNOSIS — G4700 Insomnia, unspecified: Secondary | ICD-10-CM | POA: Diagnosis not present

## 2013-08-09 DIAGNOSIS — Z471 Aftercare following joint replacement surgery: Secondary | ICD-10-CM | POA: Diagnosis not present

## 2013-08-09 DIAGNOSIS — K5900 Constipation, unspecified: Secondary | ICD-10-CM | POA: Diagnosis not present

## 2013-08-09 DIAGNOSIS — K219 Gastro-esophageal reflux disease without esophagitis: Secondary | ICD-10-CM | POA: Diagnosis not present

## 2013-08-09 DIAGNOSIS — E782 Mixed hyperlipidemia: Secondary | ICD-10-CM | POA: Diagnosis not present

## 2013-08-09 DIAGNOSIS — R413 Other amnesia: Secondary | ICD-10-CM | POA: Diagnosis not present

## 2013-08-09 DIAGNOSIS — E039 Hypothyroidism, unspecified: Secondary | ICD-10-CM | POA: Diagnosis not present

## 2013-08-09 DIAGNOSIS — F3289 Other specified depressive episodes: Secondary | ICD-10-CM | POA: Diagnosis not present

## 2013-08-09 DIAGNOSIS — Z96659 Presence of unspecified artificial knee joint: Secondary | ICD-10-CM | POA: Diagnosis not present

## 2013-08-09 DIAGNOSIS — J438 Other emphysema: Secondary | ICD-10-CM | POA: Diagnosis not present

## 2013-08-09 DIAGNOSIS — J449 Chronic obstructive pulmonary disease, unspecified: Secondary | ICD-10-CM | POA: Diagnosis not present

## 2013-08-09 DIAGNOSIS — M6281 Muscle weakness (generalized): Secondary | ICD-10-CM | POA: Diagnosis not present

## 2013-08-09 DIAGNOSIS — M1990 Unspecified osteoarthritis, unspecified site: Secondary | ICD-10-CM | POA: Diagnosis not present

## 2013-08-09 DIAGNOSIS — F17200 Nicotine dependence, unspecified, uncomplicated: Secondary | ICD-10-CM | POA: Diagnosis not present

## 2013-08-09 DIAGNOSIS — I2510 Atherosclerotic heart disease of native coronary artery without angina pectoris: Secondary | ICD-10-CM | POA: Diagnosis not present

## 2013-08-09 DIAGNOSIS — E785 Hyperlipidemia, unspecified: Secondary | ICD-10-CM | POA: Diagnosis not present

## 2013-08-09 DIAGNOSIS — D62 Acute posthemorrhagic anemia: Secondary | ICD-10-CM | POA: Diagnosis not present

## 2013-08-09 DIAGNOSIS — E871 Hypo-osmolality and hyponatremia: Secondary | ICD-10-CM | POA: Diagnosis not present

## 2013-08-09 DIAGNOSIS — I10 Essential (primary) hypertension: Secondary | ICD-10-CM | POA: Diagnosis not present

## 2013-08-09 DIAGNOSIS — F329 Major depressive disorder, single episode, unspecified: Secondary | ICD-10-CM | POA: Diagnosis not present

## 2013-08-09 DIAGNOSIS — Z5189 Encounter for other specified aftercare: Secondary | ICD-10-CM | POA: Diagnosis not present

## 2013-08-09 DIAGNOSIS — I708 Atherosclerosis of other arteries: Secondary | ICD-10-CM | POA: Diagnosis not present

## 2013-08-09 DIAGNOSIS — R269 Unspecified abnormalities of gait and mobility: Secondary | ICD-10-CM | POA: Diagnosis not present

## 2013-08-09 LAB — CBC
HCT: 28 % — ABNORMAL LOW (ref 39.0–52.0)
HEMOGLOBIN: 9.6 g/dL — AB (ref 13.0–17.0)
MCH: 31.9 pg (ref 26.0–34.0)
MCHC: 34.3 g/dL (ref 30.0–36.0)
MCV: 93 fL (ref 78.0–100.0)
Platelets: 192 10*3/uL (ref 150–400)
RBC: 3.01 MIL/uL — AB (ref 4.22–5.81)
RDW: 16.2 % — ABNORMAL HIGH (ref 11.5–15.5)
WBC: 12.6 10*3/uL — AB (ref 4.0–10.5)

## 2013-08-09 LAB — TYPE AND SCREEN
ABO/RH(D): O POS
Antibody Screen: NEGATIVE
Unit division: 0
Unit division: 0

## 2013-08-09 NOTE — Progress Notes (Signed)
Pt transported via wheelchair to private vehicle to be transported to [place] via his daughter.  Pt stable, scripts, all d/c paperwork sent with pt to [facility].

## 2013-08-09 NOTE — Progress Notes (Signed)
Report called to Bella Kennedy, Therapist, sports at [place].

## 2013-08-09 NOTE — Progress Notes (Signed)
Per MD, Pt ready for d/c.  Notified RN, Pt and facility.  Pt to discuss with daughter.  Confirmed receipt of d/c summary, as admission arranged by Weekday CSW.  Facility ready to receive Pt.  Daughter to provide transportation.  Pt to be d/c'd.  Bernita Raisin, [place] Work [phone]

## 2013-08-09 NOTE — Progress Notes (Signed)
Michael [name]  MRN: [id number] DOB/Age: Apr 19, 1932 80 y.o. Physician: Michael [name] Procedure: Procedure(s) (LRB): REVISION RIGHT TOTAL KNEE ARTHROPLASTY   (Right)     Subjective: Dressed and ready for DC  Vital Signs Temp:  [97.4 F (36.3 C)-98.4 F (36.9 C)] 98.3 F (36.8 C) (04/11 0444) Pulse Rate:  [62-93] 62 (04/11 0444) Resp:  [14-18] 16 (04/11 0444) BP: (96-146)/(49-74) 133/67 mmHg (04/11 0444) SpO2:  [90 %-98 %] 90 % (04/11 0444)  Lab Results  Recent Labs  08/08/13 0459 08/09/13 0503  WBC 16.8* 12.6*  HGB 7.6* 9.6*  HCT 21.9* 28.0*  PLT 189 192   BMET  Recent Labs  08/07/13 0422 08/08/13 0459  NA 133* 138  K 4.5 4.5  CL 100 104  CO2 22 25  GLUCOSE 135* 118*  BUN 15 17  CREATININE 1.12 1.28  CALCIUM 8.4 8.4   INR  Date Value Ref Range Status  07/30/2013 0.97  0.00 - 1.49 Final     Exam Dressing dry to knee Minimal swelling        Plan DC to Skilled rehab today after PT  Michael [name] for Dr.Kevin [name] 08/09/2013, 9:22 AM

## 2013-08-09 NOTE — Progress Notes (Signed)
Physical Therapy Treatment Patient Details Name: Michael [name] MRN: [id number] DOB: [birth year] Today's Date: 08/09/2013    History of Present Illness uni knee revised to TKR    PT Comments    Amb pt in hallway then performed all supine TKR TE's followed by ICE.   Follow Up Recommendations  SNF [facility])     Equipment Recommendations       Recommendations for Other Services       Precautions / Restrictions Precautions Precautions: Knee;Fall Precaution Comments: no need for KI Restrictions Weight Bearing Restrictions: No    Mobility  Bed Mobility               General bed mobility comments: Pt OOB in recliner  Transfers Overall transfer level: Needs assistance Equipment used: Rolling walker (2 wheeled) Transfers: Sit to/from Stand Sit to Stand: Min guard;Supervision         General transfer comment: cues for safety esp to use RW throughout turn as pt parked RW to side  Ambulation/Gait Ambulation/Gait assistance: Supervision;Min guard Ambulation Distance (Feet): 255 Feet Assistive device: Rolling walker (2 wheeled)   Gait velocity: decreased   General Gait Details: cues for safety inside RW esp with turns   Stairs            Wheelchair Mobility    Modified Rankin (Stroke Patients Only)       Balance                                    Cognition                            Exercises      General Comments        Pertinent Vitals/Pain     Home Living                      Prior Function            PT Goals (current goals can now be found in the care plan section) Progress towards PT goals: Progressing toward goals    Frequency       PT Plan Current plan remains appropriate    Co-evaluation             End of Session Equipment Utilized During Treatment: Gait belt Activity Tolerance: Patient tolerated treatment well Patient left: in chair;with call bell/phone within reach      Time: 0945-1020 PT Time Calculation (min): 35 min  Charges:  $Gait Training: 8-22 mins $Therapeutic Exercise: 8-22 mins                    G Codes:      Michael [name]  PTA [facility]  Acute  Rehab Pager      [phone]

## 2013-08-14 DIAGNOSIS — E782 Mixed hyperlipidemia: Secondary | ICD-10-CM | POA: Diagnosis not present

## 2013-08-16 DIAGNOSIS — I10 Essential (primary) hypertension: Secondary | ICD-10-CM | POA: Diagnosis not present

## 2013-08-20 DIAGNOSIS — Z96659 Presence of unspecified artificial knee joint: Secondary | ICD-10-CM | POA: Diagnosis not present

## 2013-08-20 DIAGNOSIS — Z471 Aftercare following joint replacement surgery: Secondary | ICD-10-CM | POA: Diagnosis not present

## 2013-08-21 DIAGNOSIS — I10 Essential (primary) hypertension: Secondary | ICD-10-CM | POA: Diagnosis not present

## 2013-08-21 DIAGNOSIS — J438 Other emphysema: Secondary | ICD-10-CM | POA: Diagnosis not present

## 2013-08-21 DIAGNOSIS — I708 Atherosclerosis of other arteries: Secondary | ICD-10-CM | POA: Diagnosis not present

## 2013-08-21 DIAGNOSIS — F3289 Other specified depressive episodes: Secondary | ICD-10-CM | POA: Diagnosis not present

## 2013-08-21 DIAGNOSIS — R413 Other amnesia: Secondary | ICD-10-CM | POA: Diagnosis not present

## 2013-08-21 DIAGNOSIS — F329 Major depressive disorder, single episode, unspecified: Secondary | ICD-10-CM | POA: Diagnosis not present

## 2013-08-21 DIAGNOSIS — F17200 Nicotine dependence, unspecified, uncomplicated: Secondary | ICD-10-CM | POA: Diagnosis not present

## 2013-08-21 DIAGNOSIS — E782 Mixed hyperlipidemia: Secondary | ICD-10-CM | POA: Diagnosis not present

## 2013-08-21 DIAGNOSIS — M1990 Unspecified osteoarthritis, unspecified site: Secondary | ICD-10-CM | POA: Diagnosis not present

## 2013-08-29 ENCOUNTER — Other Ambulatory Visit: Payer: Self-pay | Admitting: Cardiology

## 2013-08-29 MED ORDER — ISOSORBIDE MONONITRATE ER 60 MG PO TB24: 60.0000 mg | ORAL_TABLET | Freq: Two times a day (BID) | ORAL | Status: DC

## 2013-08-30 DIAGNOSIS — I2510 Atherosclerotic heart disease of native coronary artery without angina pectoris: Secondary | ICD-10-CM | POA: Diagnosis not present

## 2013-08-30 DIAGNOSIS — I10 Essential (primary) hypertension: Secondary | ICD-10-CM | POA: Diagnosis not present

## 2013-08-30 DIAGNOSIS — Z96659 Presence of unspecified artificial knee joint: Secondary | ICD-10-CM | POA: Diagnosis not present

## 2013-08-30 DIAGNOSIS — Z471 Aftercare following joint replacement surgery: Secondary | ICD-10-CM | POA: Diagnosis not present

## 2013-09-02 DIAGNOSIS — I10 Essential (primary) hypertension: Secondary | ICD-10-CM | POA: Diagnosis not present

## 2013-09-02 DIAGNOSIS — Z471 Aftercare following joint replacement surgery: Secondary | ICD-10-CM | POA: Diagnosis not present

## 2013-09-02 DIAGNOSIS — I2510 Atherosclerotic heart disease of native coronary artery without angina pectoris: Secondary | ICD-10-CM | POA: Diagnosis not present

## 2013-09-02 DIAGNOSIS — Z96659 Presence of unspecified artificial knee joint: Secondary | ICD-10-CM | POA: Diagnosis not present

## 2013-09-03 DIAGNOSIS — Z96659 Presence of unspecified artificial knee joint: Secondary | ICD-10-CM | POA: Diagnosis not present

## 2013-09-03 DIAGNOSIS — I10 Essential (primary) hypertension: Secondary | ICD-10-CM | POA: Diagnosis not present

## 2013-09-03 DIAGNOSIS — I2510 Atherosclerotic heart disease of native coronary artery without angina pectoris: Secondary | ICD-10-CM | POA: Diagnosis not present

## 2013-09-03 DIAGNOSIS — Z471 Aftercare following joint replacement surgery: Secondary | ICD-10-CM | POA: Diagnosis not present

## 2013-09-04 DIAGNOSIS — Z471 Aftercare following joint replacement surgery: Secondary | ICD-10-CM | POA: Diagnosis not present

## 2013-09-04 DIAGNOSIS — I2510 Atherosclerotic heart disease of native coronary artery without angina pectoris: Secondary | ICD-10-CM | POA: Diagnosis not present

## 2013-09-04 DIAGNOSIS — Z96659 Presence of unspecified artificial knee joint: Secondary | ICD-10-CM | POA: Diagnosis not present

## 2013-09-04 DIAGNOSIS — I10 Essential (primary) hypertension: Secondary | ICD-10-CM | POA: Diagnosis not present

## 2013-09-09 DIAGNOSIS — I10 Essential (primary) hypertension: Secondary | ICD-10-CM | POA: Diagnosis not present

## 2013-09-09 DIAGNOSIS — Z96659 Presence of unspecified artificial knee joint: Secondary | ICD-10-CM | POA: Diagnosis not present

## 2013-09-09 DIAGNOSIS — I2510 Atherosclerotic heart disease of native coronary artery without angina pectoris: Secondary | ICD-10-CM | POA: Diagnosis not present

## 2013-09-09 DIAGNOSIS — Z471 Aftercare following joint replacement surgery: Secondary | ICD-10-CM | POA: Diagnosis not present

## 2013-09-12 DIAGNOSIS — Z96659 Presence of unspecified artificial knee joint: Secondary | ICD-10-CM | POA: Diagnosis not present

## 2013-09-12 DIAGNOSIS — I2510 Atherosclerotic heart disease of native coronary artery without angina pectoris: Secondary | ICD-10-CM | POA: Diagnosis not present

## 2013-09-12 DIAGNOSIS — Z471 Aftercare following joint replacement surgery: Secondary | ICD-10-CM | POA: Diagnosis not present

## 2013-09-12 DIAGNOSIS — I10 Essential (primary) hypertension: Secondary | ICD-10-CM | POA: Diagnosis not present

## 2013-09-16 DIAGNOSIS — Z96659 Presence of unspecified artificial knee joint: Secondary | ICD-10-CM | POA: Diagnosis not present

## 2013-09-16 DIAGNOSIS — I10 Essential (primary) hypertension: Secondary | ICD-10-CM | POA: Diagnosis not present

## 2013-09-16 DIAGNOSIS — I2510 Atherosclerotic heart disease of native coronary artery without angina pectoris: Secondary | ICD-10-CM | POA: Diagnosis not present

## 2013-09-16 DIAGNOSIS — Z471 Aftercare following joint replacement surgery: Secondary | ICD-10-CM | POA: Diagnosis not present

## 2013-10-07 ENCOUNTER — Ambulatory Visit (INDEPENDENT_AMBULATORY_CARE_PROVIDER_SITE_OTHER): Payer: Worker's Compensation | Admitting: Internal Medicine

## 2013-10-07 ENCOUNTER — Encounter: Payer: Self-pay | Admitting: Internal Medicine

## 2013-10-07 ENCOUNTER — Ambulatory Visit (INDEPENDENT_AMBULATORY_CARE_PROVIDER_SITE_OTHER)
Admission: RE | Admit: 2013-10-07 | Discharge: 2013-10-07 | Disposition: A | Discharge status: Home / Self Care | Payer: Medicare Other | Source: Ambulatory Visit | Attending: Internal Medicine | Admitting: Internal Medicine

## 2013-10-07 VITALS — BP 124/64 | HR 85 | Ht 66.5 in | Wt 156.6 lb

## 2013-10-07 DIAGNOSIS — F17200 Nicotine dependence, unspecified, uncomplicated: Secondary | ICD-10-CM

## 2013-10-07 DIAGNOSIS — J441 Chronic obstructive pulmonary disease with (acute) exacerbation: Secondary | ICD-10-CM

## 2013-10-07 DIAGNOSIS — Z77090 Contact with and (suspected) exposure to asbestos: Secondary | ICD-10-CM

## 2013-10-07 DIAGNOSIS — J449 Chronic obstructive pulmonary disease, unspecified: Secondary | ICD-10-CM | POA: Diagnosis not present

## 2013-10-07 DIAGNOSIS — J438 Other emphysema: Secondary | ICD-10-CM

## 2013-10-07 DIAGNOSIS — J439 Emphysema, unspecified: Secondary | ICD-10-CM

## 2013-10-07 MED ORDER — THEOPHYLLINE ER 100 MG PO TB12: 100.0000 mg | ORAL_TABLET | Freq: Two times a day (BID) | ORAL | Status: DC

## 2013-10-07 MED ORDER — DOXYCYCLINE HYCLATE 100 MG PO TABS: ORAL_TABLET | ORAL | Status: DC

## 2013-10-07 NOTE — Progress Notes (Signed)
Patient ID: Michael [name], male    DOB: 1931/06/23, 80 y.o.   MRN: [id number] PCP Dr Michael [name] HPI 08/14/00- 64 yo M current smoker, coming for f/u of COPD and allergic rhinitis. Had pneumonia about a month ago and brings CXR from 07/18/10 done by Michael [name], showing COPD but NAD on my screen. . Still smokes 1 Pack/ 3days.  Admits daily morning cough, usually clear or slight yellow sputum but no blood. Admits some SOB, but exercises 3 days/ week at rehab center. Wheezes some. Continues Advair, Spiriva. Proair doesn't help a lot. Had cardiac evaluation/ stress test by cardiologist- "ok".  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 He brings a newsletter from Michael [name] and from [name], and from them he asks about CT to assess for asbestos related changes.   09/23/10 Smoker with hx asbestos exposure, COPD, allergic rhinitis. Newly dx'd renal cancer- to see Dr Michael [name]. Daughter here He notes a little cough occasionallly , not much. Denies chest pain, palpitation.He has not really tried to stop smoking. I reviewed his CT abd which included some lung level views and shared the images with them today. No lung abnormality described, but there is significant atherosclerosis.   02/03/11- Smoker with hx asbestos exposure, COPD, allergic rhinitis. Newly dx'd renal cancer  .. family here Had cryosurgery for his left renal cancer.- so far so good. Has had flu vaccine. Had chest x-ray since last here. Chest feels better with theophylline which was well tolerated. He still smokes one half pack per day we discussed trying again. We suggested nicotine replacement approaches since I notice he is chewing gum today.  08/24/11- 37 yoM Smoker with hx asbestos exposure, COPD, allergic rhinitis.  renal cancer  .. daughter here   PCP Dr [name]. [name]/ [place] Still smoking one quarter pack per day. Blames pollen for head congestion and sneeze. Admits gradually worsening dyspnea  on exertion. Coughs more in drafts or after exertion. Daily cough productive of white or yellow sputum with no blood and no chest pain. Spiriva continues to be helpful. Has 80% left carotid lesion, pending evaluation with Dr. Bridgett [name]. Hx bilateral TKR, but does exercise on bike and treadmill. CXR- 08/23/11 CHEST - 2 VIEW  Comparison: 03/03/2011  Findings: COPD. Pulmonary hyperinflation is present with mild  scarring. Negative for pneumonia or mass lesion. Negative for  heart failure or effusion. Surgical fusion is present.  IMPRESSION:  COPD. No acute cardiopulmonary disease.  Original Report Authenticated By: Michael [name], M.D.   03/01/12- 59 yoM Smoker with hx asbestos exposure, COPD, allergic rhinitis.  renal cancer  .. daughter here  Slight cough at times.   PCP Dr Michael [name]/ [place] Had CEA in the Spring- did ok.  Has had flu vaccine Light and occasional cough. No change in dyspnea with exertion. Still goes dancing some. Has not tried to stop smoking but we worked on this again today. I'm hoping we can get him motivated to try. His urologist/Dr. Alinda [name] asked him to get another chest x-ray. Patient chooses to get that done while here today COPD assessment test (CAT) score 9/40 CXR 08/22/11-reviewed IMPRESSION:  COPD. No acute cardiopulmonary disease.  Original Report Authenticated By: Michael [name], M.D.    10/07/12- 53 yoM Smoker with hx asbestos exposure, COPD, allergic rhinitis.  renal cancer   DTR Michael [name] here FOLLOWS FOR: wheezing while lying down, SOB early in the morning, cough-productive-brown in color. So smoking 1  pack per week against counseling. Is not trying to cut down below this. He reports a chest [facility] at [facility] in May of 2014. We are seeking that report but he does not understand there was any problem. PFT: 06/10/2012/Michael [name]-mild obstructive airways disease with insignificant response to bronchodilator, hyperinflation and air trapping, diffusion  moderately reduced. FVC 3.36/87%, FEV1 2.20/88%, FEV1/FVC of 0.66, TLC 121%, RV 148%, DLCO 54%. CXR 03/14/12 Findings: Unchanged linear lingular scarring. The lungs are clear.  No pulmonary edema, focal airspace consolidation or pulmonary  nodule. Chronic coarsening of the interstitial markings remains  unchanged. There is pectus excavatum on the lateral view.  Incompletely imaged anterior cervical fusion hardware at C7.  Tortuous thoracic aorta. Atherosclerotic calcifications in the  transverse aorta. No acute osseous abnormality.  IMPRESSION:  No acute osseous abnormality.  Pectus excavatum.  Original Report Authenticated By: Michael [name], M.D.  04/08/13- 63 yoM Smoker with hx asbestos exposure, COPD, allergic rhinitis.  renal cancer   DTR Michael [name] here FOLLOWS FOR: has SOB, wheezing, cough, and congestion at times. Currently being treated for sinus infection by Dr Michael [name]. PCP gave levaquin x 5 d for sinusitis- improving.Smokes after meals- discussed. Baseline is some cough, clear phlegm most days. No acute chest pain, blood or fever. Pending TKR/ Dr Michael [name]. CXR 04/03/13 IMPRESSION:  1. No acute cardiopulmonary process.  2. Emphysematous change  Electronically Signed  By: Michael [name] M.D.  On: 04/02/2013 13:30  10/07/13-81 yoM Smoker with hx asbestos exposure, COPD, allergic rhinitis.  renal cancer   DTR Michael [name] here FOLLOWS FOR: Productive cough-yellow in color x 2 weeks now. Denies any wheezing, SOB, or fever/chills. Trying to reduce cigarettes. Theophylline helps breathing. Keeps a raspy mid chest sensation and yellow sputum x3 weeks. Tolerated R TKR ok. . Review of Systems-See HPI Constitutional:   No-   weight loss, night sweats, fevers, chills, fatigue, lassitude. HEENT:   No-  headaches, difficulty swallowing, tooth/dental problems, sore throat,       No-  sneezing, itching, ear ache,  +nasal congestion, post nasal drip,  CV:  No-   chest pain, orthopnea, PND, swelling  in lower extremities, anasarca, dizziness, palpitations Resp: + shortness of breath with exertion or at rest.              + productive cough, little non-productive cough,  No-  coughing up of blood.              +change in color of mucus.  No- wheezing.   Skin: No-   rash or lesions. GI:  No-   heartburn, indigestion, abdominal pain, nausea, vomiting,  GU:  MS:  No-   joint pain or swelling.  Neuro- nothing unusual Psych:  No- change in mood or affect. No depression or anxiety.  No memory loss.  OBJ General- Alert, Oriented, Affect-appropriate, Distress- none acute; normal weight Skin- rash-none, lesions- none, excoriation- none Lymphadenopathy- none Head- atraumatic            Eyes- Gross vision intact, PERRLA, conjunctivae clear secretions            Ears- Hearing, canals-normal            Nose- Clear, no-Septal dev, mucus, polyps, erosion, perforation             Throat- Mallampati II , mucosa clear , drainage- none, tonsils- atrophic Neck- flexible , trachea midline, no stridor , thyroid nl,  Chest - symmetrical excursion , unlabored  Heart/CV- RRR , no murmur , no gallop  , no rub, nl s1 s2                           - JVD- none , edema- none, stasis changes- none, varices- none.                              + L carotid bruit (hx L CEA 2013)           Lung- decreased breath sounds, unlabored, + raspy breath sounds, wheeze -none , no-                              cough , dullness-none, rub- none           Chest wall-  Abd-  Br/ Gen/ Rectal- Not done, not indicated Extrem- cyanosis- none, clubbing, none, atrophy- none, strength- nl Neuro- grossly intact to observation

## 2013-10-07 NOTE — Patient Instructions (Addendum)
Order CXR   Dx acute exacerbation of COPD  Script for doxycycline sent  Ok to continue current meds. Please call us as needed.  Keep trying to get off those cigarettes- you know they aren't good for you.

## 2013-10-08 DIAGNOSIS — D4959 Neoplasm of unspecified behavior of other genitourinary organ: Secondary | ICD-10-CM | POA: Diagnosis not present

## 2013-10-08 DIAGNOSIS — N529 Male erectile dysfunction, unspecified: Secondary | ICD-10-CM | POA: Diagnosis not present

## 2013-10-08 DIAGNOSIS — C61 Malignant neoplasm of prostate: Secondary | ICD-10-CM | POA: Diagnosis not present

## 2013-10-15 DIAGNOSIS — L408 Other psoriasis: Secondary | ICD-10-CM | POA: Diagnosis not present

## 2013-10-17 ENCOUNTER — Ambulatory Visit (INDEPENDENT_AMBULATORY_CARE_PROVIDER_SITE_OTHER): Payer: Medicare Other | Admitting: Cardiology

## 2013-10-17 ENCOUNTER — Encounter: Payer: Self-pay | Admitting: Cardiology

## 2013-10-17 VITALS — BP 146/72 | HR 80 | Ht 66.5 in | Wt 158.0 lb

## 2013-10-17 DIAGNOSIS — I658 Occlusion and stenosis of other precerebral arteries: Secondary | ICD-10-CM

## 2013-10-17 DIAGNOSIS — E785 Hyperlipidemia, unspecified: Secondary | ICD-10-CM

## 2013-10-17 DIAGNOSIS — I6529 Occlusion and stenosis of unspecified carotid artery: Secondary | ICD-10-CM | POA: Diagnosis not present

## 2013-10-17 DIAGNOSIS — F17200 Nicotine dependence, unspecified, uncomplicated: Secondary | ICD-10-CM | POA: Diagnosis not present

## 2013-10-17 DIAGNOSIS — I6523 Occlusion and stenosis of bilateral carotid arteries: Secondary | ICD-10-CM

## 2013-10-17 DIAGNOSIS — I2510 Atherosclerotic heart disease of native coronary artery without angina pectoris: Secondary | ICD-10-CM

## 2013-10-17 NOTE — Progress Notes (Signed)
Clinical Summary Mr. Honeycutt is a medically complex 80 y.o.male last seen in March of this year. He tells me he did well with his right TKA revision. Has had no significant angina symptoms on medical therapy.  Lab work in April showed potassium 4.5, BUN 17, creatinine 1.2. He reports that his prostate cancer has returned, has been told that it is "slow growing" and at this point he is not on any specific treatment.  Lexiscan Cardiolite performed in February of this year showed no evidence of scar or ischemia with LVEF 64%.    Allergies  Allergen Reactions  . Ciprofloxacin     REACTION: nausea  . Iron Itching  . Lyrica [Pregabalin] Other (See Comments)    Unknown   . Morphine And Related     Unknown   . Oxycodone     hives  . Penicillins     REACTION: hives  . Hydrocodone-Acetaminophen Rash    Confusion    Current Outpatient Prescriptions  Medication Sig Dispense Refill  . albuterol (PROAIR HFA) 108 (90 BASE) MCG/ACT inhaler Inhale 2 puffs into the lungs every 6 (six) hours as needed. For shortness of breath      . amLODipine (NORVASC) 10 MG tablet Take 10 mg by mouth every morning.       Marland Kitchen aspirin 81 MG tablet Take 81 mg by mouth daily.      Marland Kitchen atorvastatin (LIPITOR) 40 MG tablet Take 40 mg by mouth at bedtime.       . cetirizine (ZYRTEC) 10 MG tablet Take 10 mg by mouth daily.       . citalopram (CELEXA) 20 MG tablet Take 20 mg by mouth every morning.       . clobetasol cream (TEMOVATE) 3.08 % Apply 1 application topically daily. Applied to legs for psoriasis      . docusate sodium 100 MG CAPS Take 100 mg by mouth 2 (two) times daily.  10 capsule  0  . ferrous sulfate 325 (65 FE) MG tablet Take 325 mg by mouth at bedtime.       . Fluticasone-Salmeterol (ADVAIR DISKUS) 250-50 MCG/DOSE AEPB Inhale 1 puff into the lungs every 12 (twelve) hours.        . isosorbide mononitrate (IMDUR) 60 MG 24 hr tablet Take 1 tablet (60 mg total) by mouth 2 (two) times daily.  180 tablet  3  .  levothyroxine (SYNTHROID, LEVOTHROID) 125 MCG tablet Take 125 mcg by mouth every morning.       . mometasone (NASONEX) 50 MCG/ACT nasal spray Place 2 sprays into the nose at bedtime.      . Multiple Vitamin (MULTIVITAMIN) tablet Take 1 tablet by mouth daily.      . nitroGLYCERIN (NITROSTAT) 0.4 MG SL tablet Place 0.4 mg under the tongue every 5 (five) minutes as needed. For chest pain      . Omega 3 1200 MG CAPS Take by mouth. Take 2 capsules in AM and 2 capsules in  PM      . ranitidine (ZANTAC) 150 MG tablet Take 150 mg by mouth every morning.       . Tamsulosin HCl (FLOMAX) 0.4 MG CAPS Take 0.4 mg by mouth at bedtime.       . theophylline (THEODUR) 100 MG 12 hr tablet Take 1 tablet (100 mg total) by mouth 2 (two) times daily.  180 tablet  3  . tiotropium (SPIRIVA) 18 MCG inhalation capsule Place 18 mcg into inhaler and inhale  daily.      . traZODone (DESYREL) 100 MG tablet Take 100 mg by mouth at bedtime as needed. For sleep      . polyethylene glycol (MIRALAX / GLYCOLAX) packet Take 17 g by mouth daily as needed for mild constipation.  14 each  0   No current facility-administered medications for this visit.    Past Medical History  Diagnosis Date  . Hyperthyroidism 1978    Status post radioactive iodine treatment  . History of herpes zoster 2001  . Vitreous hemorrhage MAY 2009     Right eye  . Peripheral arterial disease   . Mixed hyperlipidemia   . Essential hypertension, benign   . Allergic rhinitis   . COPD (chronic obstructive pulmonary disease)   . History of asbestos exposure   . Depression   . Anemia   . Carotid artery occlusion   . History of myocardial infarction   . Coronary atherosclerosis of native coronary artery     BMS RCA 2003, DES RCA 2004, DES RCA 2005   . GERD (gastroesophageal reflux disease)   . Hemorrhoids   . Head injury, closed, with brief LOC   . HOH (hard of hearing)   . Hypothyroidism   . Lumbar pain   . Psoriasis   . Prostate cancer      Status post XRT/IMRT 2008 - Dr. Alinda Money  . Renal cancer     Cryoablation in 2012  . Pneumonia     November 2014    Social History Mr. Adriano reports that he has been smoking Cigarettes.  He has a 15 pack-year smoking history. He has never used smokeless tobacco. Mr. Hy reports that he does not drink alcohol.  Review of Systems No palpitations, dizziness, syncope. No reported bleeding episodes. Reports compliance with his medications. Other systems reviewed and negative.  Physical Examination Filed Vitals:   10/17/13 1626  BP: 146/72  Pulse: 80   Filed Weights   10/17/13 1626  Weight: 158 lb (71.668 kg)    Patient appears comfortable at rest, no active chest pain.  HEENT: Conjunctiva and lids normal, oropharynx clear.  Neck: Supple, no elevated JVP or carotid bruits status post CEA, no thyromegaly.  Lungs: Clear to auscultation, nonlabored breathing at rest.  Cardiac: Regular rate and rhythm, no S3, soft systolic murmur, no pericardial rub.  Abdomen: Soft, nontender, bowel sounds present, no guarding or rebound.  Extremities: Trace edema, distal pulses 2+.    Problem List and Plan   CAD, NATIVE VESSEL Symptomatically stable on medical therapy. Most recent followup ischemic testing noted above. Followup arranged for 6 months.  HYPERLIPIDEMIA-MIXED He continues on Lipitor, followed by Dr. Pleas Koch.  Carotid stenosis, bilateral Keep followup with Dr. [name].  TOBACCO USER Smoking cessation has been discussed.    Satira Sark, M.D., F.A.C.C.

## 2013-10-17 NOTE — Assessment & Plan Note (Signed)
Smoking cessation has been discussed.

## 2013-10-17 NOTE — Assessment & Plan Note (Signed)
Symptomatically stable on medical therapy. Most recent followup ischemic testing noted above. Followup arranged for 6 months.

## 2013-10-17 NOTE — Assessment & Plan Note (Signed)
Keep followup with Dr. [name].

## 2013-10-17 NOTE — Assessment & Plan Note (Signed)
He continues on Lipitor, followed by Dr. Pleas Koch.

## 2013-10-17 NOTE — Patient Instructions (Signed)
Continue all current medications. Your physician wants you to follow up in: 6 months.  You will receive a reminder letter in the mail one-two months in advance.  If you don't receive a letter, please call our office to schedule the follow up appointment   

## 2013-10-23 DIAGNOSIS — E782 Mixed hyperlipidemia: Secondary | ICD-10-CM | POA: Diagnosis not present

## 2013-10-23 DIAGNOSIS — R413 Other amnesia: Secondary | ICD-10-CM | POA: Diagnosis not present

## 2013-10-23 DIAGNOSIS — E871 Hypo-osmolality and hyponatremia: Secondary | ICD-10-CM | POA: Diagnosis not present

## 2013-10-23 DIAGNOSIS — E039 Hypothyroidism, unspecified: Secondary | ICD-10-CM | POA: Diagnosis not present

## 2013-10-30 DIAGNOSIS — E782 Mixed hyperlipidemia: Secondary | ICD-10-CM | POA: Diagnosis not present

## 2013-10-30 DIAGNOSIS — R413 Other amnesia: Secondary | ICD-10-CM | POA: Diagnosis not present

## 2013-10-30 DIAGNOSIS — F17200 Nicotine dependence, unspecified, uncomplicated: Secondary | ICD-10-CM | POA: Diagnosis not present

## 2013-10-30 DIAGNOSIS — F3289 Other specified depressive episodes: Secondary | ICD-10-CM | POA: Diagnosis not present

## 2013-10-30 DIAGNOSIS — F329 Major depressive disorder, single episode, unspecified: Secondary | ICD-10-CM | POA: Diagnosis not present

## 2013-10-30 DIAGNOSIS — I708 Atherosclerosis of other arteries: Secondary | ICD-10-CM | POA: Diagnosis not present

## 2013-10-30 DIAGNOSIS — M1990 Unspecified osteoarthritis, unspecified site: Secondary | ICD-10-CM | POA: Diagnosis not present

## 2013-10-30 DIAGNOSIS — J438 Other emphysema: Secondary | ICD-10-CM | POA: Diagnosis not present

## 2013-10-30 DIAGNOSIS — I10 Essential (primary) hypertension: Secondary | ICD-10-CM | POA: Diagnosis not present

## 2013-11-13 ENCOUNTER — Other Ambulatory Visit (HOSPITAL_COMMUNITY): Payer: Self-pay | Admitting: Interventional Radiology

## 2013-11-13 ENCOUNTER — Other Ambulatory Visit: Payer: Self-pay | Admitting: Radiology

## 2013-11-13 DIAGNOSIS — C642 Malignant neoplasm of left kidney, except renal pelvis: Secondary | ICD-10-CM

## 2013-11-27 DIAGNOSIS — C649 Malignant neoplasm of unspecified kidney, except renal pelvis: Secondary | ICD-10-CM | POA: Diagnosis not present

## 2013-12-03 ENCOUNTER — Encounter (HOSPITAL_COMMUNITY): Payer: Self-pay

## 2013-12-03 ENCOUNTER — Ambulatory Visit (HOSPITAL_COMMUNITY)
Admission: RE | Admit: 2013-12-03 | Discharge: 2013-12-03 | Disposition: A | Discharge status: Home / Self Care | Payer: Medicare Other | Source: Ambulatory Visit | Attending: Interventional Radiology | Admitting: Interventional Radiology

## 2013-12-03 ENCOUNTER — Ambulatory Visit
Admission: RE | Admit: 2013-12-03 | Discharge: 2013-12-03 | Disposition: A | Discharge status: Home / Self Care | Payer: Medicare Other | Source: Ambulatory Visit | Attending: Interventional Radiology | Admitting: Interventional Radiology

## 2013-12-03 DIAGNOSIS — C649 Malignant neoplasm of unspecified kidney, except renal pelvis: Secondary | ICD-10-CM | POA: Diagnosis not present

## 2013-12-03 DIAGNOSIS — Z9089 Acquired absence of other organs: Secondary | ICD-10-CM | POA: Insufficient documentation

## 2013-12-03 DIAGNOSIS — N281 Cyst of kidney, acquired: Secondary | ICD-10-CM | POA: Diagnosis not present

## 2013-12-03 DIAGNOSIS — K7689 Other specified diseases of liver: Secondary | ICD-10-CM | POA: Diagnosis not present

## 2013-12-03 DIAGNOSIS — C61 Malignant neoplasm of prostate: Secondary | ICD-10-CM | POA: Insufficient documentation

## 2013-12-03 DIAGNOSIS — R109 Unspecified abdominal pain: Secondary | ICD-10-CM | POA: Diagnosis not present

## 2013-12-03 DIAGNOSIS — C642 Malignant neoplasm of left kidney, except renal pelvis: Secondary | ICD-10-CM

## 2013-12-03 DIAGNOSIS — Z923 Personal history of irradiation: Secondary | ICD-10-CM | POA: Diagnosis not present

## 2013-12-03 DIAGNOSIS — Z9889 Other specified postprocedural states: Secondary | ICD-10-CM | POA: Diagnosis not present

## 2013-12-03 MED ORDER — IOHEXOL 300 MG/ML  SOLN
100.0000 mL | Freq: Once | INTRAMUSCULAR | Status: AC | PRN
  Administered 2013-12-03: 100 mL via INTRAVENOUS

## 2013-12-07 NOTE — Assessment & Plan Note (Signed)
Continue surveillance, plan-chest x-ray

## 2013-12-07 NOTE — Assessment & Plan Note (Signed)
Encouraged to work hard at quitting

## 2013-12-07 NOTE — Assessment & Plan Note (Signed)
Acute bronchitis exacerbation Plan-okay to continue theophylline for now with discussion. Doxycycline, chest x-ray

## 2013-12-15 DIAGNOSIS — J019 Acute sinusitis, unspecified: Secondary | ICD-10-CM | POA: Diagnosis not present

## 2013-12-15 DIAGNOSIS — J45902 Unspecified asthma with status asthmaticus: Secondary | ICD-10-CM | POA: Diagnosis not present

## 2013-12-22 ENCOUNTER — Telehealth: Payer: Self-pay | Admitting: Cardiology

## 2013-12-22 NOTE — Telephone Encounter (Signed)
Left message to return call 

## 2013-12-22 NOTE — Telephone Encounter (Signed)
Michael [name] (daughter) called stating that [facility] told her that they can not refill Medications until our office calls letting them know that we have a new address. Needs refill on Nitro. Please call daughter Michael [name].

## 2013-12-23 MED ORDER — NITROGLYCERIN 0.4 MG SL SUBL: 0.4000 mg | SUBLINGUAL_TABLET | SUBLINGUAL | Status: DC | PRN

## 2013-12-23 NOTE — Telephone Encounter (Signed)
Discussed with patient, his old rx may have expired from looking at our last fill date.  New rx sent to [facility] at his request.

## 2014-01-09 ENCOUNTER — Ambulatory Visit: Payer: Medicare Other | Admitting: Vascular Surgery

## 2014-01-09 ENCOUNTER — Other Ambulatory Visit (HOSPITAL_COMMUNITY): Payer: Medicare Other

## 2014-01-21 DIAGNOSIS — Z23 Encounter for immunization: Secondary | ICD-10-CM | POA: Diagnosis not present

## 2014-01-26 DIAGNOSIS — I10 Essential (primary) hypertension: Secondary | ICD-10-CM | POA: Diagnosis not present

## 2014-01-26 DIAGNOSIS — E871 Hypo-osmolality and hyponatremia: Secondary | ICD-10-CM | POA: Diagnosis not present

## 2014-01-26 DIAGNOSIS — E039 Hypothyroidism, unspecified: Secondary | ICD-10-CM | POA: Diagnosis not present

## 2014-01-26 DIAGNOSIS — E782 Mixed hyperlipidemia: Secondary | ICD-10-CM | POA: Diagnosis not present

## 2014-01-26 DIAGNOSIS — F17200 Nicotine dependence, unspecified, uncomplicated: Secondary | ICD-10-CM | POA: Diagnosis not present

## 2014-01-29 ENCOUNTER — Encounter: Payer: Self-pay | Admitting: Vascular Surgery

## 2014-01-30 ENCOUNTER — Other Ambulatory Visit (HOSPITAL_COMMUNITY): Payer: Medicare Other

## 2014-01-30 ENCOUNTER — Ambulatory Visit: Payer: Medicare Other | Admitting: Vascular Surgery

## 2014-02-02 DIAGNOSIS — E782 Mixed hyperlipidemia: Secondary | ICD-10-CM | POA: Diagnosis not present

## 2014-02-02 DIAGNOSIS — E871 Hypo-osmolality and hyponatremia: Secondary | ICD-10-CM | POA: Diagnosis not present

## 2014-02-02 DIAGNOSIS — D075 Carcinoma in situ of prostate: Secondary | ICD-10-CM | POA: Diagnosis not present

## 2014-02-02 DIAGNOSIS — F17210 Nicotine dependence, cigarettes, uncomplicated: Secondary | ICD-10-CM | POA: Diagnosis not present

## 2014-02-02 DIAGNOSIS — E039 Hypothyroidism, unspecified: Secondary | ICD-10-CM | POA: Diagnosis not present

## 2014-02-02 DIAGNOSIS — I10 Essential (primary) hypertension: Secondary | ICD-10-CM | POA: Diagnosis not present

## 2014-02-02 DIAGNOSIS — M7551 Bursitis of right shoulder: Secondary | ICD-10-CM | POA: Diagnosis not present

## 2014-02-15 ENCOUNTER — Observation Stay (HOSPITAL_COMMUNITY)
Admission: EM | Admit: 2014-02-15 | Discharge: 2014-02-16 | Disposition: A | Discharge status: Home / Self Care | Payer: Medicare Other | Attending: Emergency Medicine | Admitting: Emergency Medicine

## 2014-02-15 ENCOUNTER — Encounter (HOSPITAL_COMMUNITY): Payer: Self-pay | Admitting: Emergency Medicine

## 2014-02-15 ENCOUNTER — Emergency Department (HOSPITAL_COMMUNITY): Payer: Medicare Other

## 2014-02-15 DIAGNOSIS — K649 Unspecified hemorrhoids: Secondary | ICD-10-CM | POA: Diagnosis not present

## 2014-02-15 DIAGNOSIS — Z79899 Other long term (current) drug therapy: Secondary | ICD-10-CM | POA: Diagnosis not present

## 2014-02-15 DIAGNOSIS — I2510 Atherosclerotic heart disease of native coronary artery without angina pectoris: Secondary | ICD-10-CM | POA: Diagnosis not present

## 2014-02-15 DIAGNOSIS — R05 Cough: Secondary | ICD-10-CM | POA: Diagnosis not present

## 2014-02-15 DIAGNOSIS — Z7951 Long term (current) use of inhaled steroids: Secondary | ICD-10-CM | POA: Diagnosis not present

## 2014-02-15 DIAGNOSIS — Z880 Allergy status to penicillin: Secondary | ICD-10-CM | POA: Insufficient documentation

## 2014-02-15 DIAGNOSIS — Z872 Personal history of diseases of the skin and subcutaneous tissue: Secondary | ICD-10-CM | POA: Diagnosis not present

## 2014-02-15 DIAGNOSIS — E039 Hypothyroidism, unspecified: Secondary | ICD-10-CM | POA: Insufficient documentation

## 2014-02-15 DIAGNOSIS — I6529 Occlusion and stenosis of unspecified carotid artery: Secondary | ICD-10-CM | POA: Insufficient documentation

## 2014-02-15 DIAGNOSIS — F329 Major depressive disorder, single episode, unspecified: Secondary | ICD-10-CM | POA: Insufficient documentation

## 2014-02-15 DIAGNOSIS — Z8619 Personal history of other infectious and parasitic diseases: Secondary | ICD-10-CM | POA: Insufficient documentation

## 2014-02-15 DIAGNOSIS — I739 Peripheral vascular disease, unspecified: Secondary | ICD-10-CM | POA: Diagnosis not present

## 2014-02-15 DIAGNOSIS — H9190 Unspecified hearing loss, unspecified ear: Secondary | ICD-10-CM | POA: Diagnosis not present

## 2014-02-15 DIAGNOSIS — R079 Chest pain, unspecified: Secondary | ICD-10-CM | POA: Diagnosis not present

## 2014-02-15 DIAGNOSIS — D649 Anemia, unspecified: Secondary | ICD-10-CM | POA: Insufficient documentation

## 2014-02-15 DIAGNOSIS — E0590 Thyrotoxicosis, unspecified without thyrotoxic crisis or storm: Secondary | ICD-10-CM | POA: Insufficient documentation

## 2014-02-15 DIAGNOSIS — J439 Emphysema, unspecified: Secondary | ICD-10-CM | POA: Diagnosis present

## 2014-02-15 DIAGNOSIS — Z9889 Other specified postprocedural states: Secondary | ICD-10-CM | POA: Diagnosis not present

## 2014-02-15 DIAGNOSIS — H4310 Vitreous hemorrhage, unspecified eye: Secondary | ICD-10-CM | POA: Diagnosis not present

## 2014-02-15 DIAGNOSIS — K219 Gastro-esophageal reflux disease without esophagitis: Secondary | ICD-10-CM | POA: Insufficient documentation

## 2014-02-15 DIAGNOSIS — Z7982 Long term (current) use of aspirin: Secondary | ICD-10-CM | POA: Insufficient documentation

## 2014-02-15 DIAGNOSIS — Z85528 Personal history of other malignant neoplasm of kidney: Secondary | ICD-10-CM | POA: Diagnosis not present

## 2014-02-15 DIAGNOSIS — I252 Old myocardial infarction: Secondary | ICD-10-CM | POA: Insufficient documentation

## 2014-02-15 DIAGNOSIS — E782 Mixed hyperlipidemia: Secondary | ICD-10-CM | POA: Diagnosis not present

## 2014-02-15 DIAGNOSIS — Z8546 Personal history of malignant neoplasm of prostate: Secondary | ICD-10-CM | POA: Insufficient documentation

## 2014-02-15 DIAGNOSIS — F17200 Nicotine dependence, unspecified, uncomplicated: Secondary | ICD-10-CM | POA: Diagnosis present

## 2014-02-15 DIAGNOSIS — Z720 Tobacco use: Secondary | ICD-10-CM | POA: Insufficient documentation

## 2014-02-15 DIAGNOSIS — Z8701 Personal history of pneumonia (recurrent): Secondary | ICD-10-CM | POA: Insufficient documentation

## 2014-02-15 DIAGNOSIS — E785 Hyperlipidemia, unspecified: Secondary | ICD-10-CM | POA: Diagnosis present

## 2014-02-15 DIAGNOSIS — I10 Essential (primary) hypertension: Secondary | ICD-10-CM | POA: Diagnosis not present

## 2014-02-15 DIAGNOSIS — J449 Chronic obstructive pulmonary disease, unspecified: Secondary | ICD-10-CM | POA: Diagnosis not present

## 2014-02-15 LAB — CBC
HCT: 39.3 % (ref 39.0–52.0)
HEMOGLOBIN: 13.6 g/dL (ref 13.0–17.0)
MCH: 33.3 pg (ref 26.0–34.0)
MCHC: 34.6 g/dL (ref 30.0–36.0)
MCV: 96.3 fL (ref 78.0–100.0)
Platelets: 225 10*3/uL (ref 150–400)
RBC: 4.08 MIL/uL — ABNORMAL LOW (ref 4.22–5.81)
RDW: 14.8 % (ref 11.5–15.5)
WBC: 11.3 10*3/uL — ABNORMAL HIGH (ref 4.0–10.5)

## 2014-02-15 LAB — TROPONIN I: Troponin I: <0.3 ng/mL (ref <0.30)

## 2014-02-15 LAB — BASIC METABOLIC PANEL
Anion gap: 8 (ref 5–15)
BUN: 14 mg/dL (ref 6–23)
CALCIUM: 9.2 mg/dL (ref 8.4–10.5)
CO2: 28 mEq/L (ref 19–32)
CREATININE: 1.14 mg/dL (ref 0.50–1.35)
Chloride: 100 mEq/L (ref 96–112)
GFR calc Af Amer: 68 mL/min — ABNORMAL LOW (ref >90)
GFR calc non Af Amer: 59 mL/min — ABNORMAL LOW (ref >90)
GLUCOSE: 126 mg/dL — AB (ref 70–99)
Potassium: 4 mEq/L (ref 3.7–5.3)
Sodium: 136 mEq/L — ABNORMAL LOW (ref 137–147)

## 2014-02-15 LAB — PRO B NATRIURETIC PEPTIDE: Pro B Natriuretic peptide (BNP): 98.4 pg/mL (ref 0–450)

## 2014-02-15 MED ORDER — AMLODIPINE BESYLATE 5 MG PO TABS
10.0000 mg | ORAL_TABLET | Freq: Every morning | ORAL | Status: DC
  Administered 2014-02-16: 10 mg via ORAL
  Filled 2014-02-15: qty 2

## 2014-02-15 MED ORDER — FLUTICASONE PROPIONATE 50 MCG/ACT NA SUSP
1.0000 | Freq: Every day | NASAL | Status: DC
  Administered 2014-02-15 – 2014-02-16 (×2): 1 via NASAL
  Filled 2014-02-15: qty 16

## 2014-02-15 MED ORDER — SODIUM CHLORIDE 0.9 % IJ SOLN
3.0000 mL | Freq: Two times a day (BID) | INTRAMUSCULAR | Status: DC
  Administered 2014-02-15 – 2014-02-16 (×2): 3 mL via INTRAVENOUS

## 2014-02-15 MED ORDER — MOMETASONE FURO-FORMOTEROL FUM 100-5 MCG/ACT IN AERO
2.0000 | INHALATION_SPRAY | Freq: Two times a day (BID) | RESPIRATORY_TRACT | Status: DC
  Administered 2014-02-15 – 2014-02-16 (×2): 2 via RESPIRATORY_TRACT
  Filled 2014-02-15: qty 8.8

## 2014-02-15 MED ORDER — ISOSORBIDE MONONITRATE ER 60 MG PO TB24
60.0000 mg | ORAL_TABLET | Freq: Two times a day (BID) | ORAL | Status: DC
  Administered 2014-02-16: 60 mg via ORAL
  Filled 2014-02-15: qty 1

## 2014-02-15 MED ORDER — TIOTROPIUM BROMIDE MONOHYDRATE 18 MCG IN CAPS
ORAL_CAPSULE | RESPIRATORY_TRACT | Status: AC
  Filled 2014-02-15: qty 5

## 2014-02-15 MED ORDER — ENOXAPARIN SODIUM 40 MG/0.4ML ~~LOC~~ SOLN
40.0000 mg | SUBCUTANEOUS | Status: DC
  Administered 2014-02-15: 40 mg via SUBCUTANEOUS
  Filled 2014-02-15: qty 0.4

## 2014-02-15 MED ORDER — ACETAMINOPHEN 650 MG RE SUPP: 650.0000 mg | Freq: Four times a day (QID) | RECTAL | Status: DC | PRN

## 2014-02-15 MED ORDER — TIOTROPIUM BROMIDE MONOHYDRATE 18 MCG IN CAPS
18.0000 ug | ORAL_CAPSULE | Freq: Every day | RESPIRATORY_TRACT | Status: DC
  Administered 2014-02-15 – 2014-02-16 (×2): 18 ug via RESPIRATORY_TRACT
  Filled 2014-02-15: qty 5

## 2014-02-15 MED ORDER — DOCUSATE SODIUM 100 MG PO CAPS: 100.0000 mg | ORAL_CAPSULE | Freq: Every day | ORAL | Status: DC | PRN

## 2014-02-15 MED ORDER — TAMSULOSIN HCL 0.4 MG PO CAPS
0.4000 mg | ORAL_CAPSULE | Freq: Every day | ORAL | Status: DC
  Administered 2014-02-15: 0.4 mg via ORAL
  Filled 2014-02-15: qty 1

## 2014-02-15 MED ORDER — ATORVASTATIN CALCIUM 40 MG PO TABS
40.0000 mg | ORAL_TABLET | Freq: Every day | ORAL | Status: DC
  Administered 2014-02-15: 40 mg via ORAL
  Filled 2014-02-15: qty 1

## 2014-02-15 MED ORDER — MOMETASONE FURO-FORMOTEROL FUM 100-5 MCG/ACT IN AERO
INHALATION_SPRAY | RESPIRATORY_TRACT | Status: AC
  Filled 2014-02-15: qty 8.8

## 2014-02-15 MED ORDER — CITALOPRAM HYDROBROMIDE 20 MG PO TABS
20.0000 mg | ORAL_TABLET | Freq: Every morning | ORAL | Status: DC
  Administered 2014-02-16: 20 mg via ORAL
  Filled 2014-02-15: qty 1

## 2014-02-15 MED ORDER — TRAZODONE HCL 50 MG PO TABS
100.0000 mg | ORAL_TABLET | Freq: Every evening | ORAL | Status: DC | PRN
  Administered 2014-02-15: 100 mg via ORAL
  Filled 2014-02-15: qty 2

## 2014-02-15 MED ORDER — NITROGLYCERIN 0.4 MG SL SUBL: 0.4000 mg | SUBLINGUAL_TABLET | SUBLINGUAL | Status: DC | PRN

## 2014-02-15 MED ORDER — FAMOTIDINE 20 MG PO TABS
20.0000 mg | ORAL_TABLET | Freq: Every day | ORAL | Status: DC
  Administered 2014-02-16: 20 mg via ORAL
  Filled 2014-02-15: qty 1

## 2014-02-15 MED ORDER — THEOPHYLLINE ER 200 MG PO TB12: 100.0000 mg | ORAL_TABLET | Freq: Two times a day (BID) | ORAL | Status: DC

## 2014-02-15 MED ORDER — ACETAMINOPHEN 325 MG PO TABS: 650.0000 mg | ORAL_TABLET | Freq: Four times a day (QID) | ORAL | Status: DC | PRN

## 2014-02-15 MED ORDER — ASPIRIN 81 MG PO TABS: 81.0000 mg | ORAL_TABLET | Freq: Every day | ORAL | Status: DC

## 2014-02-15 MED ORDER — LEVOTHYROXINE SODIUM 25 MCG PO TABS
125.0000 ug | ORAL_TABLET | Freq: Every day | ORAL | Status: DC
  Administered 2014-02-16: 125 ug via ORAL
  Filled 2014-02-15 (×2): qty 1

## 2014-02-15 MED ORDER — ASPIRIN EC 81 MG PO TBEC
81.0000 mg | DELAYED_RELEASE_TABLET | Freq: Every day | ORAL | Status: DC
Start: 2014-02-16 — End: 2014-02-16
  Administered 2014-02-16: 81 mg via ORAL
  Filled 2014-02-15: qty 1

## 2014-02-15 MED ORDER — ALBUTEROL SULFATE (2.5 MG/3ML) 0.083% IN NEBU: 3.0000 mL | INHALATION_SOLUTION | Freq: Four times a day (QID) | RESPIRATORY_TRACT | Status: DC | PRN

## 2014-02-15 MED ORDER — ALUM & MAG HYDROXIDE-SIMETH 200-200-20 MG/5ML PO SUSP: 30.0000 mL | Freq: Four times a day (QID) | ORAL | Status: DC | PRN

## 2014-02-15 NOTE — ED Provider Notes (Signed)
CSN: [id number]     Arrival date & time 02/15/14  1247 History  This chart was scribed for Nat Christen, MD by Tula Nakayama, ED Scribe. This patient was seen in room APA02/APA02 and the patient's care was started at 1:23 PM.   Chief Complaint  Patient presents with  . Chest Pain   The history is provided by the patient and a relative. No language interpreter was used.   HPI Comments: RYOTA TREECE is a 80 y.o. male with a history of COPD, CAD and 3 stents, who presents to the Emergency Department complaining of constant, aching, left-sided CP that radiates to both of his arms and started 4 hours ago. He denies abdominal pain, nausea, sweating, SOB, and swelling in his legs as associated symptoms. Pt took 3 NTG PTA with some relief to symptoms. Pt has a history of minor MI during a catheterization in 11/2010. Dr. Domenic Polite noted no change from previous catheterization in 2010. Pt also had a cardiolite stress test in 06/2013 at [facility] that was normal. Pt smokes 1 ppd. He lives by himself, but has 4 children who live nearby.   PCP is Dr. Pleas Koch Cardiologist is Dr. Domenic Polite Past Medical History  Diagnosis Date  . Hyperthyroidism 1978    Status post radioactive iodine treatment  . History of herpes zoster 2001  . Vitreous hemorrhage MAY 2009     Right eye  . Peripheral arterial disease   . Mixed hyperlipidemia   . Essential hypertension, benign   . Allergic rhinitis   . COPD (chronic obstructive pulmonary disease)   . History of asbestos exposure   . Depression   . Anemia   . Carotid artery occlusion   . History of myocardial infarction   . Coronary atherosclerosis of native coronary artery     BMS RCA 2003, DES RCA 2004, DES RCA 2005   . GERD (gastroesophageal reflux disease)   . Hemorrhoids   . Head injury, closed, with brief LOC   . HOH (hard of hearing)   . Hypothyroidism   . Lumbar pain   . Psoriasis   . Prostate cancer     Status post XRT/IMRT 2008 - Dr. Alinda Money  .  Renal cancer     Cryoablation in 2012  . Pneumonia     November 2014   Past Surgical History  Procedure Laterality Date  . Popliteal synovial cyst excision      knee  . Cervical disc surgery  APRIL 2004    fusion  . Partial knee arthroplasty  JULY 2005    Right-replacement  . Nasal septum surgery      SMR  . Neck disk fusion  07/2002  . Knee arthroplasty      bilateral  . Endarterectomy  08/30/2011    Procedure: Left ENDARTERECTOMY CAROTID;  Surgeon: Conrad [place], MD;  Location: [facility];  Service: Vascular;  Laterality: Left;  . Joint replacement Right 2005    Right partial Knee  . Joint replacement Left 2011    Left toatal Knee  . Endarterectomy Right 12/18/2012    Procedure: ENDARTERECTOMY CAROTID WITH BOVINE PATCH ANGIOPLASTY;  Surgeon: Conrad [place], MD;  Location: [facility];  Service: Vascular;  Laterality: Right;  . Carotid endarterectomy    . Cholecystectomy  1995  . Cardiac catheterization      AUG 2003, AUG 2004, OCT 2005 9 HAS 3 STENTS DONE AT 3 DIFFERENT CATHS - ONE IS DRUG ELUTING STENT; CATH 2006, 2007, 2009 2010  .  Eye surgery  2010    bilat cataract.2009-Vitreous Hemorrhage-2009  . Total knee revision Right 08/06/2013    Procedure: REVISION RIGHT TOTAL KNEE ARTHROPLASTY  ;  Surgeon: Gearlean Alf, MD;  Location: [facility];  Service: Orthopedics;  Laterality: Right;   Family History  Problem Relation Age of Onset  . Coronary artery disease Brother   . Cancer Brother   . Heart disease Brother   . Heart attack Brother   . Anesthesia problems Neg Hx    History  Substance Use Topics  . Smoking status: Current Every Day Smoker -- 0.25 packs/day for 60 years    Types: Cigarettes  . Smokeless tobacco: Never Used  . Alcohol Use: No     Comment: TRYING TO QUIT SMOKING    Review of Systems  Constitutional: Negative for diaphoresis.  Respiratory: Negative for shortness of breath.   Cardiovascular: Positive for chest pain. Negative for leg swelling.  Gastrointestinal:  Negative for nausea and abdominal pain.  Musculoskeletal: Positive for arthralgias.    A complete 10 system review of systems was obtained and all systems are negative except as noted in the HPI and PMH.    Allergies  Ciprofloxacin; Iron; Lyrica; Morphine and related; Oxycodone; Penicillins; and Hydrocodone-acetaminophen  Home Medications   Prior to Admission medications   Medication Sig Start Date End Date Taking? Authorizing Provider  amLODipine (NORVASC) 10 MG tablet Take 10 mg by mouth every morning.    Yes Historical Provider, MD  aspirin 81 MG tablet Take 81 mg by mouth daily.   Yes Historical Provider, MD  atorvastatin (LIPITOR) 40 MG tablet Take 40 mg by mouth at bedtime.    Yes Historical Provider, MD  cetirizine (ZYRTEC) 10 MG tablet Take 10 mg by mouth daily.    Yes Historical Provider, MD  citalopram (CELEXA) 20 MG tablet Take 20 mg by mouth every morning.    Yes Historical Provider, MD  clobetasol cream (TEMOVATE) 2.42 % Apply 1 application topically daily. Applied to legs for psoriasis   Yes Historical Provider, MD  ferrous sulfate 325 (65 FE) MG tablet Take 325 mg by mouth at bedtime.    Yes Historical Provider, MD  Fluticasone-Salmeterol (ADVAIR DISKUS) 250-50 MCG/DOSE AEPB Inhale 1 puff into the lungs every 12 (twelve) hours.     Yes Historical Provider, MD  isosorbide mononitrate (IMDUR) 60 MG 24 hr tablet Take 1 tablet (60 mg total) by mouth 2 (two) times daily. 08/29/13  Yes Satira Sark, MD  levothyroxine (SYNTHROID, LEVOTHROID) 125 MCG tablet Take 125 mcg by mouth every morning.    Yes Historical Provider, MD  mometasone (NASONEX) 50 MCG/ACT nasal spray Place 2 sprays into the nose at bedtime. 11/24/10  Yes Deneise Lever, MD  Multiple Vitamin (MULTIVITAMIN) tablet Take 1 tablet by mouth daily.   Yes Historical Provider, MD  nitroGLYCERIN (NITROSTAT) 0.4 MG SL tablet Place 1 tablet (0.4 mg total) under the tongue every 5 (five) minutes as needed. For chest pain  12/23/13  Yes Satira Sark, MD  Omega 3 1200 MG CAPS Take by mouth. Take 2 capsules in AM and 2 capsules in  PM   Yes Historical Provider, MD  ranitidine (ZANTAC) 150 MG tablet Take 150 mg by mouth every morning.    Yes Historical Provider, MD  Tamsulosin HCl (FLOMAX) 0.4 MG CAPS Take 0.4 mg by mouth at bedtime.    Yes Historical Provider, MD  theophylline (THEODUR) 100 MG 12 hr tablet Take 1 tablet (100 mg  total) by mouth 2 (two) times daily. 10/07/13  Yes Deneise Lever, MD  tiotropium (SPIRIVA) 18 MCG inhalation capsule Place 18 mcg into inhaler and inhale daily. 08/01/12  Yes Deneise Lever, MD  albuterol (PROAIR HFA) 108 (90 BASE) MCG/ACT inhaler Inhale 2 puffs into the lungs every 6 (six) hours as needed. For shortness of breath    Historical Provider, MD  polyethylene glycol (MIRALAX / GLYCOLAX) packet Take 17 g by mouth daily as needed for mild constipation. 08/06/13   Amber Renelda Loma, PA-C  traZODone (DESYREL) 100 MG tablet Take 100 mg by mouth at bedtime as needed. For sleep    Historical Provider, MD   BP 129/54  Pulse 61  Temp(Src) 97.9 F (36.6 C) (Oral)  Resp 18  Ht 5\' 8"  (1.727 m)  Wt 160 lb (72.576 kg)  BMI 24.33 kg/m2  SpO2 94% Physical Exam  Nursing note and vitals reviewed. Constitutional: He is oriented to person, place, and time. He appears well-developed and well-nourished.  HENT:  Head: Normocephalic and atraumatic.  Eyes: Conjunctivae and EOM are normal. Pupils are equal, round, and reactive to light.  Neck: Normal range of motion. Neck supple.  Cardiovascular: Normal rate, regular rhythm and normal heart sounds.   Pulmonary/Chest: Effort normal and breath sounds normal.  Abdominal: Soft. Bowel sounds are normal.  Musculoskeletal: Normal range of motion.  Neurological: He is alert and oriented to person, place, and time.  Skin: Skin is warm and dry.  Psychiatric: He has a normal mood and affect. His behavior is normal.    ED Course  Procedures  (including critical care time) DIAGNOSTIC STUDIES: Oxygen Saturation is 97% on RA, adequate by my interpretation.    COORDINATION OF CARE: 1:27 PM Will order lab work and will monitor in the ED. Discussed treatment plan with pt at bedside and pt agreed to plan.    Labs Review Labs Reviewed  CBC - Abnormal; Notable for the following:    WBC 11.3 (*)    RBC 4.08 (*)    All other components within normal limits  BASIC METABOLIC PANEL - Abnormal; Notable for the following:    Sodium 136 (*)    Glucose, Bld 126 (*)    GFR calc non Af Amer 59 (*)    GFR calc Af Amer 68 (*)    All other components within normal limits  PRO B NATRIURETIC PEPTIDE  TROPONIN I    Imaging Review Dg Chest 2 View  02/15/2014   CLINICAL DATA:  Cough and congestion.  EXAM: CHEST  2 VIEW  COMPARISON:  PA and lateral chest 10/07/2013 and 04/02/2013.  FINDINGS: The lungs are emphysematous. Mild basilar scarring is unchanged. There is no consolidative process, pneumothorax or effusion. Heart size is normal. No focal bony abnormality.  IMPRESSION: Emphysema without acute disease.   Electronically Signed   By: Inge Rise M.D.   On: 02/15/2014 13:53     EKG Interpretation   Date/Time:  Sunday February 15 2014 12:53:53 EDT Ventricular Rate:  83 PR Interval:  214 QRS Duration: 97 QT Interval:  375 QTC Calculation: 441 R Axis:   9 Text Interpretation:  Sinus rhythm Borderline prolonged PR interval Low  voltage, precordial leads Baseline wander in lead(s) V4 Confirmed by Kailand Seda   MD, Terran Hollenkamp ([id number]) on 02/15/2014 1:36:47 PM      MDM   Final diagnoses:  Chest pain, unspecified chest pain type  Chest pain with multiple cardiac risk factors. Patient is hemodynamically stable. EKG,  troponin, chest x-ray, hemoglobin all stable. Admit to observation  I personally performed the services described in this documentation, which was scribed in my presence. The recorded information has been reviewed and is  accurate.     Nat Christen, MD 02/15/14 1539

## 2014-02-15 NOTE — H&P (Signed)
History and Physical  Michael [name] WJX:[id number] DOB: [birth year] DOA: 02/15/2014  Referring physician: Dr. Lacinda Axon, ED physician PCP: Curlene Labrum, MD   Chief Complaint: Chest pain  HPI: Michael [name] is a 80 y.o. male  With a history of hypertension, COPD, hypothyroidism secondary to radioactive iodine treatment for hyperthyroidism, coronary artery disease status post 3 stents, depression. He presents to the hospital after developing sudden onset of left-sided sharp chest pain with no radiation. This started at approximately 11:00. The patient took 3 nitroglycerin which did not help with the pain. He then presented to the hospital for evaluation. He denies provoking or palliating factors. He states that this pain is similar to prior episodes of chest pain. He recently had a stress test in February 2015 which was negative. He is followed by Dr. Guy Sandifer of cardiology.  Cardiac risk factors: Smoker, coronary artery disease, male at the age of 78  Review of Systems:   Pt complains of arm pain bilaterally..  Pt denies any fevers, chills, nausea, vomiting, abdominal pain, diaphoresis, palpitations, cough, wheezing, shortness of breath.  Review of systems are otherwise negative  Past Medical History  Diagnosis Date  . Hyperthyroidism 1978    Status post radioactive iodine treatment  . History of herpes zoster 2001  . Vitreous hemorrhage MAY 2009     Right eye  . Peripheral arterial disease   . Mixed hyperlipidemia   . Essential hypertension, benign   . Allergic rhinitis   . COPD (chronic obstructive pulmonary disease)   . History of asbestos exposure   . Depression   . Anemia   . Carotid artery occlusion   . History of myocardial infarction   . Coronary atherosclerosis of native coronary artery     BMS RCA 2003, DES RCA 2004, DES RCA 2005   . GERD (gastroesophageal reflux disease)   . Hemorrhoids   . Head injury, closed, with brief LOC   . HOH (hard of hearing)   .  Hypothyroidism   . Lumbar pain   . Psoriasis   . Prostate cancer     Status post XRT/IMRT 2008 - Dr. Alinda Money  . Renal cancer     Cryoablation in 2012  . Pneumonia     November 2014   Past Surgical History  Procedure Laterality Date  . Popliteal synovial cyst excision      knee  . Cervical disc surgery  APRIL 2004    fusion  . Partial knee arthroplasty  JULY 2005    Right-replacement  . Nasal septum surgery      SMR  . Neck disk fusion  07/2002  . Knee arthroplasty      bilateral  . Endarterectomy  08/30/2011    Procedure: Left ENDARTERECTOMY CAROTID;  Surgeon: Conrad Woodford, MD;  Location: [facility];  Service: Vascular;  Laterality: Left;  . Joint replacement Right 2005    Right partial Knee  . Joint replacement Left 2011    Left toatal Knee  . Endarterectomy Right 12/18/2012    Procedure: ENDARTERECTOMY CAROTID WITH BOVINE PATCH ANGIOPLASTY;  Surgeon: Conrad Benton[place], MD;  Location: [facility];  Service: Vascular;  Laterality: Right;  . Carotid endarterectomy    . Cholecystectomy  1995  . Cardiac catheterization      AUG 2003, AUG 2004, OCT 2005 9 HAS 3 STENTS DONE AT 3 DIFFERENT CATHS - ONE IS DRUG ELUTING STENT; CATH 2006, 2007, 2009 2010  . Eye surgery  2010    bilat cataract.2009-Vitreous  Hemorrhage-2009  . Total knee revision Right 08/06/2013    Procedure: REVISION RIGHT TOTAL KNEE ARTHROPLASTY  ;  Surgeon: Gearlean Alf, MD;  Location: [facility];  Service: Orthopedics;  Laterality: Right;   Social History:  reports that he has been smoking Cigarettes.  He has a 15 pack-year smoking history. He has never used smokeless tobacco. He reports that he does not drink alcohol or use illicit drugs. Patient lives at home & is able to participate in activities of daily living without assistance  Allergies  Allergen Reactions  . Ciprofloxacin     REACTION: nausea  . Iron Itching  . Lyrica [Pregabalin] Other (See Comments)    Unknown   . Morphine And Related     Unknown   . Oxycodone      hives  . Penicillins     REACTION: hives  . Hydrocodone-Acetaminophen Rash    Confusion    Family History  Problem Relation Age of Onset  . Coronary artery disease Brother   . Cancer Brother   . Heart disease Brother   . Heart attack Brother   . Anesthesia problems Neg Hx       Prior to Admission medications   Medication Sig Start Date End Date Taking? Authorizing Provider  amLODipine (NORVASC) 10 MG tablet Take 10 mg by mouth every morning.    Yes Historical Provider, MD  aspirin 81 MG tablet Take 81 mg by mouth daily.   Yes Historical Provider, MD  atorvastatin (LIPITOR) 40 MG tablet Take 40 mg by mouth at bedtime.    Yes Historical Provider, MD  cetirizine (ZYRTEC) 10 MG tablet Take 10 mg by mouth daily.    Yes Historical Provider, MD  citalopram (CELEXA) 20 MG tablet Take 20 mg by mouth every morning.    Yes Historical Provider, MD  clobetasol cream (TEMOVATE) 6.59 % Apply 1 application topically daily. Applied to legs for psoriasis   Yes Historical Provider, MD  ferrous sulfate 325 (65 FE) MG tablet Take 325 mg by mouth at bedtime.    Yes Historical Provider, MD  Fluticasone-Salmeterol (ADVAIR DISKUS) 250-50 MCG/DOSE AEPB Inhale 1 puff into the lungs every 12 (twelve) hours.     Yes Historical Provider, MD  isosorbide mononitrate (IMDUR) 60 MG 24 hr tablet Take 1 tablet (60 mg total) by mouth 2 (two) times daily. 08/29/13  Yes Satira Sark, MD  levothyroxine (SYNTHROID, LEVOTHROID) 125 MCG tablet Take 125 mcg by mouth every morning.    Yes Historical Provider, MD  mometasone (NASONEX) 50 MCG/ACT nasal spray Place 2 sprays into the nose at bedtime. 11/24/10  Yes Deneise Lever, MD  Multiple Vitamin (MULTIVITAMIN) tablet Take 1 tablet by mouth daily.   Yes Historical Provider, MD  nitroGLYCERIN (NITROSTAT) 0.4 MG SL tablet Place 1 tablet (0.4 mg total) under the tongue every 5 (five) minutes as needed. For chest pain 12/23/13  Yes Satira Sark, MD  Omega 3 1200 MG CAPS Take  by mouth. Take 2 capsules in AM and 2 capsules in  PM   Yes Historical Provider, MD  ranitidine (ZANTAC) 150 MG tablet Take 150 mg by mouth every morning.    Yes Historical Provider, MD  Tamsulosin HCl (FLOMAX) 0.4 MG CAPS Take 0.4 mg by mouth at bedtime.    Yes Historical Provider, MD  theophylline (THEODUR) 100 MG 12 hr tablet Take 1 tablet (100 mg total) by mouth 2 (two) times daily. 10/07/13  Yes Deneise Lever, MD  tiotropium (  SPIRIVA) 18 MCG inhalation capsule Place 18 mcg into inhaler and inhale daily. 08/01/12  Yes Deneise Lever, MD  albuterol (PROAIR HFA) 108 (90 BASE) MCG/ACT inhaler Inhale 2 puffs into the lungs every 6 (six) hours as needed. For shortness of breath    Historical Provider, MD  polyethylene glycol (MIRALAX / GLYCOLAX) packet Take 17 g by mouth daily as needed for mild constipation. 08/06/13   Amber Renelda Loma, PA-C  traZODone (DESYREL) 100 MG tablet Take 100 mg by mouth at bedtime as needed. For sleep    Historical Provider, MD    Physical Exam: BP 119/57  Pulse 61  Temp(Src) 97.9 F (36.6 C) (Oral)  Resp 17  Ht 5\' 8"  (1.727 m)  Wt 72.576 kg (160 lb)  BMI 24.33 kg/m2  SpO2 94%  General: Elderly Caucasian male. Awake and alert and oriented x3. No acute cardiopulmonary distress.  Eyes: Pupils equal, round, reactive to light. Extraocular muscles are intact. Sclerae anicteric and noninjected.  ENT: External auditory canals are patent and tympanic membranes reflect a good cone of light. Moist mucosal membranes. No mucosal lesions.  Neck: Neck supple without lymphadenopathy. No carotid bruits. No masses palpated.  Cardiovascular: Regular rate with normal S1-S2 sounds. No murmurs, rubs, gallops auscultated. No JVD.  Respiratory: Good respiratory effort with no wheezes, rales, rhonchi. Lungs clear to auscultation bilaterally.  Abdomen: Soft, nontender, nondistended. Active bowel sounds. No masses or hepatosplenomegaly  Skin: Dry, warm to touch. 2+ dorsalis pedis and  radial pulses. Musculoskeletal: No calf or leg pain. All major joints not erythematous nontender. No reproduction of chest pain to palpation Psychiatric: Intact judgment and insight.  Neurologic: No focal neurological deficits. Cranial nerves II through XII are grossly intact.           Labs on Admission:  Basic Metabolic Panel:  Recent Labs Lab 02/15/14 1255  NA 136*  K 4.0  CL 100  CO2 28  GLUCOSE 126*  BUN 14  CREATININE 1.14  CALCIUM 9.2   Liver Function Tests: No results found for this basename: AST, ALT, ALKPHOS, BILITOT, PROT, ALBUMIN,  in the last 168 hours No results found for this basename: LIPASE, AMYLASE,  in the last 168 hours No results found for this basename: AMMONIA,  in the last 168 hours CBC:  Recent Labs Lab 02/15/14 1255  WBC 11.3*  HGB 13.6  HCT 39.3  MCV 96.3  PLT 225   Cardiac Enzymes:  Recent Labs Lab 02/15/14 1255  TROPONINI <0.30    BNP (last 3 results)  Recent Labs  02/15/14 1255  PROBNP 98.4   CBG: No results found for this basename: GLUCAP,  in the last 168 hours  Radiological Exams on Admission: Dg Chest 2 View  02/15/2014   CLINICAL DATA:  Cough and congestion.  EXAM: CHEST  2 VIEW  COMPARISON:  PA and lateral chest 10/07/2013 and 04/02/2013.  FINDINGS: The lungs are emphysematous. Mild basilar scarring is unchanged. There is no consolidative process, pneumothorax or effusion. Heart size is normal. No focal bony abnormality.  IMPRESSION: Emphysema without acute disease.   Electronically Signed   By: Inge Rise M.D.   On: 02/15/2014 13:53    EKG: Independently reviewed. Sinus rhythm at 83 beats per minute. PR interval is borderline elevated at 0.214.  Normal QRS. No ST elevation or depression.  Assessment/Plan Present on Admission:  . Chest pain  #1 atypical chest pain The patient's risk factors we'll admit for observation on telemetry and rule out with  serial enzymes. Will continue nitroglycerin for pain. Will  obtain lipid panel in the morning. Due to recent stress test, if patient were to rule out I do not feel that the patient would need a stress test in the morning.  #2 hypertension Continue antihypertensives  #3 COPD Continue inhalers  #4 hypothyroidism Continue levothyroxine  DVT prophylaxis: Lovenox  Consultants: None  Code Status: Full  Family Communication: Daughter in the room   Disposition Plan: Home following observation  Time spent: 50 minutes  [place], [place] [facility] Pager [phone]  **Disclaimer: This note may have been dictated with voice recognition software. Similar sounding words can inadvertently be transcribed and this note may contain transcription errors which may not have been corrected upon publication of note.**

## 2014-02-15 NOTE — ED Notes (Signed)
Patient states he has had productive cough x 5 days. Today at 1030am started having left sided chest pain with pain in both arms, aching in nature. Patient has hx of MI with stents and high cholesterol

## 2014-02-16 DIAGNOSIS — R079 Chest pain, unspecified: Secondary | ICD-10-CM | POA: Diagnosis not present

## 2014-02-16 LAB — TROPONIN I
Troponin I: <0.3 ng/mL (ref <0.30)
Troponin I: <0.3 ng/mL (ref <0.30)

## 2014-02-16 LAB — LIPID PANEL
CHOL/HDL RATIO: 1.8 ratio
CHOLESTEROL: 122 mg/dL (ref 0–200)
HDL: 67 mg/dL (ref >39)
LDL Cholesterol: 40 mg/dL (ref 0–99)
Triglycerides: 74 mg/dL (ref <150)
VLDL: 15 mg/dL (ref 0–40)

## 2014-02-16 MED ORDER — THEOPHYLLINE ER 200 MG PO TB12
100.0000 mg | ORAL_TABLET | Freq: Two times a day (BID) | ORAL | Status: DC
  Administered 2014-02-16: 100 mg via ORAL
  Filled 2014-02-16 (×5): qty 1

## 2014-02-16 MED ORDER — LEVOTHYROXINE SODIUM 125 MCG PO TABS
125.0000 ug | ORAL_TABLET | Freq: Every day | ORAL | Status: DC
  Filled 2014-02-16 (×3): qty 1

## 2014-02-16 NOTE — Discharge Summary (Signed)
Physician Discharge Summary  Michael [name] ZES:[id number] DOB: [birth year] DOA: 02/15/2014  PCP: Curlene Labrum, MD  Admit date: 02/15/2014 Discharge date: 02/16/2014  Time spent: 40 minutes  Recommendations for Outpatient Follow-up:  1. Follow up with PCP 1-2 weeks for evaluation of persistent cough.  2. Follow up with Dr Domenic Polite 02/27/14 for evaluation of chest pain.   Discharge Diagnoses:  Principal Problem:   Chest pain Active Problems:   TOBACCO USER   COPD with emphysema   Essential (primary) hypertension   HLD (hyperlipidemia)   Discharge Condition: stable  Diet recommendation: heart healthy  Filed Weights   02/15/14 1256 02/15/14 1630  Weight: 72.576 kg (160 lb) 76.204 kg (168 lb)    History of present illness:  Patient with a history of hypertension, COPD, hypothyroidism secondary to radioactive iodine treatment for hyperthyroidism, coronary artery disease status post 3 stents, depression presented to the hospital after developing sudden onset of left-sided sharp chest pain with no radiation. This started at approximately 11:00am. The patient took 3 nitroglycerin which did not help with the pain. He then presented to the hospital for evaluation. He denied provoking or palliating factors. He stated that this pain was similar to prior episodes of chest pain. He recently had a stress test in February 2015 which was negative. He is followed by Dr. Conni Elliot of cardiology.  Hospital Course:  #1 atypical chest pain  Admited for observation on telemetry and ruled out with serial enzymes. Risk factors include smoker, CAD. EKG NSR with borderline prolonged PR. Chest xray yields emphysema without acute disease. Lipid panel within the limits of normal. Chart review indicates recent stress test (06/2013) showed no evidence of scar or ischemia with LVEH 64%. He had no further episodes and was pain free at discharge. Have scheduled follow up with Dr Domenic Polite 02/27/14.  #2  hypertension: fair control. Continued home norvasc, imdur.   #3 COPD with cough: chart review indicates visit with Dr. Annamaria Boots on 11/2013 for acute bronchitis. Provided with doxycycline at that time. Xray as above. Recommend follow up with PCP 1 week if no improvement with cough. Continue inhalers.  #4 hypothyroidism  Continue levothyroxine   Procedures:  none  Consultations:  none  Discharge Exam: Filed Vitals:   02/16/14 0501  BP: 132/50  Pulse: 54  Temp: 98.3 F (36.8 C)  Resp: 20    General: well nourished appears comfortable  Cardiovascular: slightly distant. RRR no MGR no LE edema Respiratory: normal effort. Good air flow. BS somewhat coarse particularly in bases. No crackles  Discharge Instructions You were cared for by a hospitalist during your hospital stay. If you have any questions about your discharge medications or the care you received while you were in the hospital after you are discharged, you can call the unit and asked to speak with the hospitalist on call if the hospitalist that took care of you is not available. Once you are discharged, your primary care physician will handle any further medical issues. Please note that NO REFILLS for any discharge medications will be authorized once you are discharged, as it is imperative that you return to your primary care physician (or establish a relationship with a primary care physician if you do not have one) for your aftercare needs so that they can reassess your need for medications and monitor your lab values.   Current Discharge Medication List    CONTINUE these medications which have NOT CHANGED   Details  amLODipine (NORVASC) 10 MG tablet Take 10 mg  by mouth every morning.     aspirin 81 MG tablet Take 81 mg by mouth daily.    atorvastatin (LIPITOR) 40 MG tablet Take 40 mg by mouth at bedtime.     cetirizine (ZYRTEC) 10 MG tablet Take 10 mg by mouth daily.     citalopram (CELEXA) 20 MG tablet Take 20 mg by  mouth every morning.     clobetasol cream (TEMOVATE) 2.35 % Apply 1 application topically daily. Applied to legs for psoriasis    ferrous sulfate 325 (65 FE) MG tablet Take 325 mg by mouth at bedtime.     Fluticasone-Salmeterol (ADVAIR DISKUS) 250-50 MCG/DOSE AEPB Inhale 1 puff into the lungs every 12 (twelve) hours.      isosorbide mononitrate (IMDUR) 60 MG 24 hr tablet Take 1 tablet (60 mg total) by mouth 2 (two) times daily. Qty: 180 tablet, Refills: 3    levothyroxine (SYNTHROID, LEVOTHROID) 125 MCG tablet Take 125 mcg by mouth every morning.     mometasone (NASONEX) 50 MCG/ACT nasal spray Place 2 sprays into the nose at bedtime.    Multiple Vitamin (MULTIVITAMIN) tablet Take 1 tablet by mouth daily.    nitroGLYCERIN (NITROSTAT) 0.4 MG SL tablet Place 1 tablet (0.4 mg total) under the tongue every 5 (five) minutes as needed. For chest pain Qty: 25 tablet, Refills: 3    Omega 3 1200 MG CAPS Take by mouth. Take 2 capsules in AM and 2 capsules in  PM    ranitidine (ZANTAC) 150 MG tablet Take 150 mg by mouth every morning.     Tamsulosin HCl (FLOMAX) 0.4 MG CAPS Take 0.4 mg by mouth at bedtime.     theophylline (THEODUR) 100 MG 12 hr tablet Take 1 tablet (100 mg total) by mouth 2 (two) times daily. Qty: 180 tablet, Refills: 3    tiotropium (SPIRIVA) 18 MCG inhalation capsule Place 18 mcg into inhaler and inhale daily.    albuterol (PROAIR HFA) 108 (90 BASE) MCG/ACT inhaler Inhale 2 puffs into the lungs every 6 (six) hours as needed. For shortness of breath    polyethylene glycol (MIRALAX / GLYCOLAX) packet Take 17 g by mouth daily as needed for mild constipation. Qty: 14 each, Refills: 0    traZODone (DESYREL) 100 MG tablet Take 100 mg by mouth at bedtime as needed. For sleep       Allergies  Allergen Reactions  . Ciprofloxacin     REACTION: nausea  . Iron Itching  . Lyrica [Pregabalin] Other (See Comments)    Unknown   . Morphine And Related     Unknown   . Oxycodone      hives  . Penicillins     REACTION: hives  . Hydrocodone-Acetaminophen Rash    Confusion   Follow-up Information   Follow up with Curlene Labrum, MD In 1 week. (if no improvement in cough, consider repeat chest xray)    Contact information:   [address] [place] [address] [phone]       Follow up with Rozann Lesches, MD On 02/27/2014. (appointment at 10:40am)    Specialty:  Cardiology   Contact information:   [place]  [address] [phone]        The results of significant diagnostics from this hospitalization (including imaging, microbiology, ancillary and laboratory) are listed below for reference.    Significant Diagnostic Studies: Dg Chest 2 View  02/15/2014   CLINICAL DATA:  Cough and congestion.  EXAM: CHEST  2 VIEW  COMPARISON:  PA and lateral chest 10/07/2013 and 04/02/2013.  FINDINGS: The lungs are emphysematous. Mild basilar scarring is unchanged. There is no consolidative process, pneumothorax or effusion. Heart size is normal. No focal bony abnormality.  IMPRESSION: Emphysema without acute disease.   Electronically Signed   By: Inge Rise M.D.   On: 02/15/2014 13:53    Microbiology: No results found for this or any previous visit (from the past 240 hour(s)).   Labs: Basic Metabolic Panel:  Recent Labs Lab 02/15/14 1255  NA 136*  K 4.0  CL 100  CO2 28  GLUCOSE 126*  BUN 14  CREATININE 1.14  CALCIUM 9.2   Liver Function Tests: No results found for this basename: AST, ALT, ALKPHOS, BILITOT, PROT, ALBUMIN,  in the last 168 hours No results found for this basename: LIPASE, AMYLASE,  in the last 168 hours No results found for this basename: AMMONIA,  in the last 168 hours CBC:  Recent Labs Lab 02/15/14 1255  WBC 11.3*  HGB 13.6  HCT 39.3  MCV 96.3  PLT 225   Cardiac Enzymes:  Recent Labs Lab 02/15/14 1255 02/15/14 1926 02/16/14 0124 02/16/14 0634  TROPONINI <0.30 <0.30 <0.30 <0.30   BNP: BNP  (last 3 results)  Recent Labs  02/15/14 1255  PROBNP 98.4   CBG: No results found for this basename: GLUCAP,  in the last 168 hours     Signed:  Radene Gunning  [facility] 02/16/2014, 12:09 PM

## 2014-02-16 NOTE — Discharge Summary (Signed)
Patient seen, independently examined and chart reviewed. I agree with exam, assessment and plan discussed with Dyanne Carrel, NP.  24yom with h/o CAD/stent placement and normal Lexiscan Cardiolite stress test 2/02015 presented to ED wifh left-sided chest pain. Admitted for atypical chest pain.  Subjective: No recurrent chest pain. No SOB. No complaints, wants to go home. No recent chest pain. Yesterday prior to admission had 10-15 minutes of chest pain, no SOB, n/v or other other symptoms.  Objective: afebrile, VSS, no hypoxia  Gen. Appears calm, comfortable. Well-appearing.  Cardiovascular. RRR no m/r/g. Telemetry SR. Reviewed telemetry with Dr. Doreatha Lew VT (it is artifact).  Respiratory. CTA bilaterally, no w/r/r. Normal respiratory effort.     BMP unremarkable on admission  Troponins negative  CBC unremarkable--normal Hgb, minimal leukocytosis likely stress reaction  CXR--emphysema, no acute disease  EKG SR, 1st degree AVB (old), no acute changes  Brief episode of atypical CP resolved and has not recurred. Has ruled out for ACS. NM stress 06/27/2013 was normal. Given this, no further evaluation is suggested. Plan discharge home.  Michael Hodgkins, MD [facility] [phone]

## 2014-02-16 NOTE — Progress Notes (Signed)
Discussed patient low heart rate with Dr. Sarajane Jews. Will give medications as ordered.

## 2014-02-16 NOTE — Progress Notes (Signed)
Patient sitting up on edge of bed. Alert and oriented. No complaints voiced at this time.

## 2014-02-19 ENCOUNTER — Encounter: Payer: Self-pay | Admitting: Vascular Surgery

## 2014-02-20 ENCOUNTER — Encounter: Payer: Self-pay | Admitting: Vascular Surgery

## 2014-02-20 ENCOUNTER — Ambulatory Visit (HOSPITAL_COMMUNITY)
Admission: RE | Admit: 2014-02-20 | Discharge: 2014-02-20 | Disposition: A | Discharge status: Home / Self Care | Payer: Medicare Other | Source: Ambulatory Visit | Attending: Vascular Surgery | Admitting: Vascular Surgery

## 2014-02-20 ENCOUNTER — Ambulatory Visit (INDEPENDENT_AMBULATORY_CARE_PROVIDER_SITE_OTHER): Payer: Medicare Other | Admitting: Vascular Surgery

## 2014-02-20 VITALS — BP 138/84 | HR 55 | Ht 67.0 in | Wt 158.0 lb

## 2014-02-20 DIAGNOSIS — I2510 Atherosclerotic heart disease of native coronary artery without angina pectoris: Secondary | ICD-10-CM | POA: Diagnosis not present

## 2014-02-20 DIAGNOSIS — I6523 Occlusion and stenosis of bilateral carotid arteries: Secondary | ICD-10-CM | POA: Diagnosis not present

## 2014-02-20 DIAGNOSIS — I6529 Occlusion and stenosis of unspecified carotid artery: Secondary | ICD-10-CM | POA: Insufficient documentation

## 2014-02-20 DIAGNOSIS — Z48812 Encounter for surgical aftercare following surgery on the circulatory system: Secondary | ICD-10-CM | POA: Diagnosis not present

## 2014-02-20 NOTE — Progress Notes (Signed)
Established Carotid Patient  History of Present Illness  Michael [name] is a 80 y.o. (1931-10-11) male who presents with chief complaint: routine surveillance.  Pt underwent R CEA (12/18/12) and L CEA (11/25/10) for asx B ICA stenosis >80%.  Patient has no history of TIA or stroke symptom.  The patient has nevern had amaurosis fugax or monocular blindness.  The patient has never had facial drooping or hemiplegia.  The patient has never had receptive or expressive aphasia.    The patient's PMH, PSH, SH, FamHx, Med, and Allergies are unchanged from 07/04/13.  On ROS today: no stroke sx, no intermittent claudication   Physical Examination  Filed Vitals:   02/20/14 1136 02/20/14 1137  BP: 146/73 138/84  Pulse: 55   Height: 5\' 7"  (1.702 m)   Weight: 158 lb (71.668 kg)   SpO2: 95%    Body mass index is 24.74 kg/(m^2).  General: A&O x 3, WDWN  Eyes: PERRLA, EOMI  Neck: Supple, no nuchal rigidity, no palpable LAD, neck incision well healed  Pulmonary: Sym exp, good air movt, CTAB, no rales, rhonchi, & wheezing  Cardiac: RRR, Nl S1, S2, no Murmurs, rubs or gallops  Vascular: Vessel Right Left  Radial Palpable Palpable  Brachial Palpable Palpable  Carotid Palpable, without bruit Palpable, without bruit  Aorta Not palpable N/A  Femoral Palpable Palpable  Popliteal Not palpable Not palpable  PT Palpable Faintly Palpable  DP Palpable Faintly Palpable   Gastrointestinal: soft, NTND, -G/R, - HSM, - masses, - CVAT B  Musculoskeletal: M/S 5/5 throughout , Extremities without ischemic changes   Neurologic: CN 2-12 intact , Pain and light touch intact in extremities , Motor exam as listed above  Non-Invasive Vascular Imaging  CAROTID DUPLEX (Date: 02/20/2014 ):   R ICA stenosis: Widely patent  R VA: patent and antegrade  L ICA stenosis: Widely patent  L VA: patent and antegrade  Medical Decision Making  Michael [name] is a 80 y.o. male who presents with: s/p B CEA for  asx ICA stenosis >80%.   No evidence of recurrence on either side  Based on the patient's vascular studies and examination, I have offered the patient: B carotid duplex q2 years.  I discussed in depth with the patient the nature of atherosclerosis, and emphasized the importance of maximal medical management including strict control of blood pressure, blood glucose, and lipid levels, antiplatelet agents, obtaining regular exercise, and cessation of smoking.    The patient is aware that without maximal medical management the underlying atherosclerotic disease process will progress, limiting the benefit of any interventions. The patient is currently on a statin: Lipitor. The patient is currently on an anti-platelet: ASA.  Thank you for allowing [place] to participate in this patient's care.  Adele Barthel, MD [facility] Office: [phone] Pager: [phone]  02/20/2014, 12:03 PM   VASCULAR QUALITY INITIATIVE FOLLOW UP DATA:  Current smoker: [  ] yes  [ x ] no  Living status: [ x ]  Home  [  ] Nursing home  [  ] Homeless    MEDS:  ASA [x  ] yes  [  ] no- [  ] medical reason  [  ] non compliant  STATIN  [ x ] yes  [  ] no- [  ] medical reason  [  ] non compliant  Beta blocker [  ] yes  [ x ] no- [  ] medical reason  [  ]  non compliant  ACE inhibitor [  ] yes  [ x ] no- [  ] medical reason  [  ] non compliant  P2Y12 Antagonist [ x ] none  [  ] clopidogrel-Plavix  [  ] ticlopidine-Ticlid   [  ] prasugrel-Effient  [  ] ticagrelor- Brilinta    Anticoagulant [ x ] None  [  ] warfarin  [  ] rivaroxaban-Xarelto [  ] dabigatran- Pradaxa  Neurologic event since D/C:  [ x ] no  [  ] yes: [  ] eye event  [  ] cortical event  [  ] VB event  [  ] non specific event  [  ] right  [  ] left  [  ] TIA  [  ] stroke  Date:   Modified Rankin Score: 0  MI since D/C: [  ] no  [  ] troponin only  [  ] EKG or clinical  Cranial nerve injury: [  ] none  [  ] resolved  [   ] persistent  Duplex CEA site: [  ] no  [ x ] yes - PSV= 104  EDV= 24  ICA/CCA ratio: 1.15  Stenosis= [  ] <40% [  ] 40-59% [  ] 60-79%  [  ] > 80%  [  ]  Occluded  CEA site re-operation:  [x  ] no   [  ] yes- date of re-op:  CEA site PCI:   [ x ] no   [  ] yes- date of PCI:

## 2014-02-20 NOTE — Addendum Note (Signed)
Addended by: Mena Goes on: 02/20/2014 03:54 PM   Modules accepted: Orders

## 2014-02-24 DIAGNOSIS — Z961 Presence of intraocular lens: Secondary | ICD-10-CM | POA: Diagnosis not present

## 2014-02-24 DIAGNOSIS — H3531 Nonexudative age-related macular degeneration: Secondary | ICD-10-CM | POA: Diagnosis not present

## 2014-02-24 DIAGNOSIS — H26493 Other secondary cataract, bilateral: Secondary | ICD-10-CM | POA: Diagnosis not present

## 2014-02-24 DIAGNOSIS — H524 Presbyopia: Secondary | ICD-10-CM | POA: Diagnosis not present

## 2014-02-27 ENCOUNTER — Encounter: Payer: Self-pay | Admitting: Cardiology

## 2014-03-13 DIAGNOSIS — Z96651 Presence of right artificial knee joint: Secondary | ICD-10-CM | POA: Diagnosis not present

## 2014-03-13 DIAGNOSIS — Z471 Aftercare following joint replacement surgery: Secondary | ICD-10-CM | POA: Diagnosis not present

## 2014-03-25 DIAGNOSIS — J209 Acute bronchitis, unspecified: Secondary | ICD-10-CM | POA: Diagnosis not present

## 2014-03-25 DIAGNOSIS — J439 Emphysema, unspecified: Secondary | ICD-10-CM | POA: Diagnosis not present

## 2014-03-25 DIAGNOSIS — I10 Essential (primary) hypertension: Secondary | ICD-10-CM | POA: Diagnosis not present

## 2014-03-25 DIAGNOSIS — F17210 Nicotine dependence, cigarettes, uncomplicated: Secondary | ICD-10-CM | POA: Diagnosis not present

## 2014-04-10 ENCOUNTER — Ambulatory Visit: Payer: Self-pay | Admitting: Internal Medicine

## 2014-04-13 ENCOUNTER — Ambulatory Visit: Payer: Self-pay | Admitting: Internal Medicine

## 2014-04-15 ENCOUNTER — Ambulatory Visit (HOSPITAL_COMMUNITY)
Admission: RE | Admit: 2014-04-15 | Discharge: 2014-04-15 | Disposition: A | Discharge status: Home / Self Care | Payer: Medicare Other | Source: Ambulatory Visit | Attending: Urology | Admitting: Urology

## 2014-04-15 ENCOUNTER — Other Ambulatory Visit (HOSPITAL_COMMUNITY): Payer: Self-pay | Admitting: Urology

## 2014-04-15 DIAGNOSIS — J449 Chronic obstructive pulmonary disease, unspecified: Secondary | ICD-10-CM | POA: Insufficient documentation

## 2014-04-15 DIAGNOSIS — D495 Neoplasm of unspecified behavior of other genitourinary organs: Secondary | ICD-10-CM | POA: Insufficient documentation

## 2014-04-15 DIAGNOSIS — D49519 Neoplasm of unspecified behavior of unspecified kidney: Secondary | ICD-10-CM

## 2014-04-20 ENCOUNTER — Ambulatory Visit (INDEPENDENT_AMBULATORY_CARE_PROVIDER_SITE_OTHER): Payer: Medicare Other | Admitting: Cardiology

## 2014-04-20 ENCOUNTER — Ambulatory Visit: Payer: Self-pay | Admitting: Cardiology

## 2014-04-20 ENCOUNTER — Encounter: Payer: Self-pay | Admitting: Cardiology

## 2014-04-20 VITALS — BP 114/64 | HR 82 | Ht 67.0 in | Wt 156.0 lb

## 2014-04-20 DIAGNOSIS — I6523 Occlusion and stenosis of bilateral carotid arteries: Secondary | ICD-10-CM | POA: Diagnosis not present

## 2014-04-20 DIAGNOSIS — I2510 Atherosclerotic heart disease of native coronary artery without angina pectoris: Secondary | ICD-10-CM

## 2014-04-20 DIAGNOSIS — E782 Mixed hyperlipidemia: Secondary | ICD-10-CM | POA: Diagnosis not present

## 2014-04-20 NOTE — Progress Notes (Signed)
Reason for visit: CAD, hypertension  Clinical Summary Michael [name] is a medically complex 80 y.o.male last seen in June. Interval records reviewed, he was hospitalized at [facility] back in October with chest pain. He ruled out for myocardial infarction at that time and was managed medically. He is here for a follow-up visit today, reports no further chest pain symptoms. States that he has been compliant with his medications.  Lexiscan Cardiolite performed in February of this year showed no evidence of scar or ischemia with LVEF 64%. We discussed continuing medical therapy and observation for now, unless his symptoms progress.  Continues to follow with Dr. [name] for carotid artery disease.  Lipid panel from October showed cholesterol 122, triglycerides 74, HDL 67, LDL 40. He continues on Lipitor.   Allergies  Allergen Reactions  . Ciprofloxacin     REACTION: nausea  . Iron Itching  . Lyrica [Pregabalin] Other (See Comments)    Unknown   . Morphine And Related     Unknown   . Oxycodone     hives  . Penicillins     REACTION: hives  . Hydrocodone-Acetaminophen Rash    Confusion    Current Outpatient Prescriptions  Medication Sig Dispense Refill  . albuterol (PROAIR HFA) 108 (90 BASE) MCG/ACT inhaler Inhale 2 puffs into the lungs every 6 (six) hours as needed. For shortness of breath    . amLODipine (NORVASC) 10 MG tablet Take 10 mg by mouth every morning.     Marland Kitchen aspirin 81 MG tablet Take 81 mg by mouth daily.    Marland Kitchen atorvastatin (LIPITOR) 40 MG tablet Take 40 mg by mouth at bedtime.     . cetirizine (ZYRTEC) 10 MG tablet Take 10 mg by mouth daily.     . citalopram (CELEXA) 20 MG tablet Take 20 mg by mouth every morning.     . clobetasol cream (TEMOVATE) 5.73 % Apply 1 application topically daily. Applied to legs for psoriasis    . ferrous sulfate 325 (65 FE) MG tablet Take 325 mg by mouth at bedtime.     . Fluticasone-Salmeterol (ADVAIR DISKUS) 250-50 MCG/DOSE AEPB Inhale 1 puff  into the lungs every 12 (twelve) hours.      . isosorbide mononitrate (IMDUR) 60 MG 24 hr tablet Take 1 tablet (60 mg total) by mouth 2 (two) times daily. 180 tablet 3  . levothyroxine (SYNTHROID, LEVOTHROID) 125 MCG tablet Take 125 mcg by mouth every morning.     . mometasone (NASONEX) 50 MCG/ACT nasal spray Place 2 sprays into the nose at bedtime.    . Multiple Vitamin (MULTIVITAMIN) tablet Take 1 tablet by mouth daily.    . nitroGLYCERIN (NITROSTAT) 0.4 MG SL tablet Place 1 tablet (0.4 mg total) under the tongue every 5 (five) minutes as needed. For chest pain 25 tablet 3  . Omega 3 1200 MG CAPS Take by mouth. Take 2 capsules in AM and 2 capsules in  PM    . polyethylene glycol (MIRALAX / GLYCOLAX) packet Take 17 g by mouth daily as needed for mild constipation. 14 each 0  . ranitidine (ZANTAC) 150 MG tablet Take 150 mg by mouth every morning.     . Tamsulosin HCl (FLOMAX) 0.4 MG CAPS Take 0.4 mg by mouth at bedtime.     . theophylline (THEODUR) 100 MG 12 hr tablet Take 1 tablet (100 mg total) by mouth 2 (two) times daily. 180 tablet 3  . tiotropium (SPIRIVA) 18 MCG inhalation capsule Place 18 mcg  into inhaler and inhale daily.    . traZODone (DESYREL) 100 MG tablet Take 100 mg by mouth at bedtime as needed. For sleep     No current facility-administered medications for this visit.    Past Medical History  Diagnosis Date  . Hyperthyroidism 1978    Status post radioactive iodine treatment  . History of herpes zoster 2001  . Vitreous hemorrhage MAY 2009     Right eye  . Peripheral arterial disease   . Mixed hyperlipidemia   . Essential hypertension, benign   . Allergic rhinitis   . COPD (chronic obstructive pulmonary disease)   . History of asbestos exposure   . Depression   . Anemia   . Carotid artery occlusion   . History of myocardial infarction   . Coronary atherosclerosis of native coronary artery     BMS RCA 2003, DES RCA 2004, DES RCA 2005   . GERD (gastroesophageal reflux  disease)   . Hemorrhoids   . Head injury, closed, with brief LOC   . HOH (hard of hearing)   . Hypothyroidism   . Lumbar pain   . Psoriasis   . Prostate cancer     Status post XRT/IMRT 2008 - Dr. Alinda Money  . Renal cancer     Cryoablation in 2012  . Pneumonia     November 2014    Social History Michael [name] reports that he has been smoking Cigarettes.  He started smoking about 70 years ago. He has a 15 pack-year smoking history. He has never used smokeless tobacco. Michael [name] reports that he does not drink alcohol.  Review of Systems Complete review of systems negative except as otherwise outlined in the clinical summary and also the following. No palpitations, dizziness, syncope.  Physical Examination Filed Vitals:   04/20/14 1551  BP: 114/64  Pulse: 82   Filed Weights   04/20/14 1551  Weight: 156 lb (70.761 kg)   Patient appears comfortable at rest, no active chest pain.  HEENT: Conjunctiva and lids normal, oropharynx clear.  Neck: Supple, no elevated JVP or carotid bruits status post CEA, no thyromegaly.  Lungs: Clear to auscultation, nonlabored breathing at rest.  Cardiac: Regular rate and rhythm, no S3, soft systolic murmur, no pericardial rub.  Abdomen: Soft, nontender, bowel sounds present, no guarding or rebound.  Extremities: Trace edema, distal pulses 2+.    Problem List and Plan   CAD, NATIVE VESSEL Symptomatically stable at this time. No changes made to current medical regimen which is outlined above. Follow-up arranged in 6 months, sooner if needed.  Mixed hyperlipidemia He continues on Zocor, LDL 40 in October.  Carotid stenosis, bilateral Status post CEA, keep follow-up with Dr. [name].    Satira Sark, M.D., F.A.C.C.

## 2014-04-20 NOTE — Assessment & Plan Note (Signed)
He continues on Zocor, LDL 40 in October.

## 2014-04-20 NOTE — Assessment & Plan Note (Signed)
Symptomatically stable at this time. No changes made to current medical regimen which is outlined above. Follow-up arranged in 6 months, sooner if needed.

## 2014-04-20 NOTE — Patient Instructions (Addendum)

## 2014-04-20 NOTE — Assessment & Plan Note (Signed)
Status post CEA, keep follow-up with Dr. [name].

## 2014-04-22 DIAGNOSIS — C61 Malignant neoplasm of prostate: Secondary | ICD-10-CM | POA: Diagnosis not present

## 2014-04-22 DIAGNOSIS — D495 Neoplasm of unspecified behavior of other genitourinary organs: Secondary | ICD-10-CM | POA: Diagnosis not present

## 2014-04-22 DIAGNOSIS — N5201 Erectile dysfunction due to arterial insufficiency: Secondary | ICD-10-CM | POA: Diagnosis not present

## 2014-04-27 ENCOUNTER — Encounter: Payer: Self-pay | Admitting: Internal Medicine

## 2014-04-27 ENCOUNTER — Ambulatory Visit (INDEPENDENT_AMBULATORY_CARE_PROVIDER_SITE_OTHER): Payer: Worker's Compensation | Admitting: Internal Medicine

## 2014-04-27 VITALS — BP 118/76 | HR 89 | Temp 97.9°F | Ht 68.0 in | Wt 156.0 lb

## 2014-04-27 DIAGNOSIS — J432 Centrilobular emphysema: Secondary | ICD-10-CM | POA: Diagnosis not present

## 2014-04-27 DIAGNOSIS — Z720 Tobacco use: Secondary | ICD-10-CM | POA: Diagnosis not present

## 2014-04-27 DIAGNOSIS — F17200 Nicotine dependence, unspecified, uncomplicated: Secondary | ICD-10-CM

## 2014-04-27 NOTE — Progress Notes (Signed)
Patient ID: Michael [name], male    DOB: 1932-03-04, 80 y.o.   MRN: [id number] PCP [name] HPI 08/14/00- 46 yo M current smoker, coming for f/u of COPD and allergic rhinitis. Had pneumonia about a month ago and brings CXR from 07/18/10 done by [name], showing COPD but NAD on my screen. . Still smokes 1 Pack/ 3days.  Admits daily morning cough, usually clear or slight yellow sputum but no blood. Admits some SOB, but exercises 3 days/ week at rehab center. Wheezes some. Continues Advair, Spiriva. Proair doesn't help a lot. Had cardiac evaluation/ stress test by cardiologist- "ok".  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 He brings a newsletter from [employer] and from [place], and from them he asks about CT to assess for asbestos related changes.   09/23/10 Smoker with hx asbestos exposure, COPD, allergic rhinitis. Newly dx'd renal cancer- to see [name]. Daughter here He notes a little cough occasionallly , not much. Denies chest pain, palpitation.He has not really tried to stop smoking. I reviewed his CT abd which included some lung level views and shared the images with them today. No lung abnormality described, but there is significant atherosclerosis.   02/03/11- Smoker with hx asbestos exposure, COPD, allergic rhinitis. Newly dx'd renal cancer  .. family here Had cryosurgery for his left renal cancer.- so far so good. Has had flu vaccine. Had chest x-ray since last here. Chest feels better with theophylline which was well tolerated. He still smokes one half pack per day we discussed trying again. We suggested nicotine replacement approaches since I notice he is chewing gum today.  08/24/11- 48 yoM Smoker with hx asbestos exposure, COPD, allergic rhinitis.  renal cancer  .. daughter here   PCP [name]. [name] Still smoking one quarter pack per day. Blames pollen for head congestion and sneeze. Admits gradually worsening dyspnea  on exertion. Coughs more in drafts or after exertion. Daily cough productive of white or yellow sputum with no blood and no chest pain. Spiriva continues to be helpful. Has 80% left carotid lesion, pending evaluation with Dr. [name]. Hx bilateral TKR, but does exercise on bike and treadmill. CXR- 08/23/11 CHEST - 2 VIEW  Comparison: 03/03/2011  Findings: COPD. Pulmonary hyperinflation is present with mild  scarring. Negative for pneumonia or mass lesion. Negative for  heart failure or effusion. Surgical fusion is present.  IMPRESSION:  COPD. No acute cardiopulmonary disease.  Original Report Authenticated By: [name], M.D.   03/01/12- 25 yoM Smoker with hx asbestos exposure, COPD, allergic rhinitis.  renal cancer  .. daughter here  Slight cough at times.   PCP [name] Had CEA in the Spring- did ok.  Has had flu vaccine Light and occasional cough. No change in dyspnea with exertion. Still goes dancing some. Has not tried to stop smoking but we worked on this again today. I'm hoping we can get him motivated to try. His urologist/Dr. Alinda Money asked him to get another chest x-ray. Patient chooses to get that done while here today COPD assessment test (CAT) score 9/40 CXR 08/22/11-reviewed IMPRESSION:  COPD. No acute cardiopulmonary disease.  Original Report Authenticated By: [name], M.D.    10/07/12- 5 yoM Smoker with hx asbestos exposure, COPD, allergic rhinitis.  renal cancer   DTR [name] here FOLLOWS FOR: wheezing while lying down, SOB early in the morning, cough-productive-brown in color. So smoking 1  pack per week against counseling. Is not trying to cut down below this. He reports a chest x-[facility] at [facility] in May of 2014. We are seeking that report but he does not understand there was any problem. PFT: 06/10/2012/[place]-mild obstructive airways disease with insignificant response to bronchodilator, hyperinflation and air trapping, diffusion  moderately reduced. FVC 3.36/87%, FEV1 2.20/88%, FEV1/FVC of 0.66, TLC 121%, RV 148%, DLCO 54%. CXR 03/14/12 Findings: Unchanged linear lingular scarring. The lungs are clear.  No pulmonary edema, focal airspace consolidation or pulmonary  nodule. Chronic coarsening of the interstitial markings remains  unchanged. There is pectus excavatum on the lateral view.  Incompletely imaged anterior cervical fusion hardware at C7.  Tortuous thoracic aorta. Atherosclerotic calcifications in the  transverse aorta. No acute osseous abnormality.  IMPRESSION:  No acute osseous abnormality.  Pectus excavatum.  Original Report Authenticated By: Jacqulynn Cadet, M.D.  04/08/13- 61 yoM Smoker with hx asbestos exposure, COPD, allergic rhinitis.  renal cancer  DTR [name] here FOLLOWS FOR: has SOB, wheezing, cough, and congestion at times. Currently being treated for sinus infection by [name]. PCP gave levaquin x 5 d for sinusitis- improving.Smokes after meals- discussed. Baseline is some cough, clear phlegm most days. No acute chest pain, blood or fever. Pending TKR/ Dr Maureen Ralphs.  CXR 04/03/13 IMPRESSION:  1. No acute cardiopulmonary process.  2. Emphysematous change  Electronically Signed  By: Suzy Bouchard M.D.  On: 04/02/2013 13:30  10/07/13-81 yoM Smoker with hx asbestos exposure, COPD, allergic rhinitis.  renal cancer   DTR [name] here FOLLOWS FOR: Productive cough-yellow in color x 2 weeks now. Denies any wheezing, SOB, or fever/chills. Trying to reduce cigarettes. Theophylline helps breathing. Keeps a raspy mid chest sensation and yellow sputum x3 weeks. Tolerated R TKR ok.  04/27/14- 63 yoM Smoker with hx asbestos exposure, COPD, allergic rhinitis.  renal cancer, prostate cancer DTR [name] here FOLLOWS FOR: Pt has cough with yellow mucus. He has sob with exhertion and wheezing.  Still smoking with no effort to stop despite counseling. CXR 04/15/14- IMPRESSION: Bibasilar scarring, COPD. No  active disease. Electronically Signed  By: Rolm Baptise M.D.  On: 04/15/2014 12:27.   Review of Systems-See HPI Constitutional:   No-   weight loss, night sweats, fevers, chills, fatigue, lassitude. HEENT:   No-  headaches, difficulty swallowing, tooth/dental problems, sore throat,       No-  sneezing, itching, ear ache,  +nasal congestion, post nasal drip,  CV:  No-   chest pain, orthopnea, PND, swelling in lower extremities, anasarca, dizziness, palpitations Resp: + shortness of breath with exertion or at rest.              + productive cough, little non-productive cough,  No-  coughing up of blood.              +change in color of mucus.  No- wheezing.   Skin: No-   rash or lesions. GI:  No-   heartburn, indigestion, abdominal pain, nausea, vomiting,  GU:  MS:  No-   joint pain or swelling.  Neuro- nothing unusual Psych:  No- change in mood or affect. No depression or anxiety.  No memory loss.  OBJ General- Alert, Oriented, Affect-appropriate, Distress- none acute; normal weight Skin- rash-none, lesions- none, excoriation- none Lymphadenopathy- none Head- atraumatic            Eyes- Gross vision intact, PERRLA, conjunctivae clear secretions            Ears- Hearing, canals-normal  Nose- Clear, no-Septal dev, mucus, polyps, erosion, perforation             Throat- Mallampati II , mucosa clear , drainage- none, tonsils- atrophic Neck- flexible , trachea midline, no stridor , thyroid nl,  Chest - symmetrical excursion , unlabored           Heart/CV- RRR , no murmur , no gallop  , no rub, nl s1 s2                           - JVD- none , edema- none, stasis changes- none, varices- none.                              + L carotid bruit (hx L CEA 2013)           Lung- decreased breath sounds, unlabored, + raspy breath sounds, wheeze -none , no- cough , dullness-none, rub- none           Chest wall-  Abd-  Br/ Gen/ Rectal- Not done, not indicated Extrem- cyanosis- none,  clubbing, none, atrophy- none, strength- nl Neuro- grossly intact to observation

## 2014-04-27 NOTE — Patient Instructions (Addendum)
ok to continue present meds and call us as needed  Please try to stop those last few cigarettes

## 2014-05-03 NOTE — Assessment & Plan Note (Signed)
Exam unchanged. Smoking pattern unchanged. Recognize history of renal and prostate cancers means he will be watched for possible metastasis.

## 2014-05-03 NOTE — Assessment & Plan Note (Signed)
Counseled again, family is supportive

## 2014-05-05 DIAGNOSIS — I10 Essential (primary) hypertension: Secondary | ICD-10-CM | POA: Diagnosis not present

## 2014-05-05 DIAGNOSIS — E871 Hypo-osmolality and hyponatremia: Secondary | ICD-10-CM | POA: Diagnosis not present

## 2014-05-13 DIAGNOSIS — M1991 Primary osteoarthritis, unspecified site: Secondary | ICD-10-CM | POA: Diagnosis not present

## 2014-05-13 DIAGNOSIS — J302 Other seasonal allergic rhinitis: Secondary | ICD-10-CM | POA: Diagnosis not present

## 2014-05-13 DIAGNOSIS — N400 Enlarged prostate without lower urinary tract symptoms: Secondary | ICD-10-CM | POA: Diagnosis not present

## 2014-05-13 DIAGNOSIS — I10 Essential (primary) hypertension: Secondary | ICD-10-CM | POA: Diagnosis not present

## 2014-05-13 DIAGNOSIS — F17210 Nicotine dependence, cigarettes, uncomplicated: Secondary | ICD-10-CM | POA: Diagnosis not present

## 2014-05-13 DIAGNOSIS — J439 Emphysema, unspecified: Secondary | ICD-10-CM | POA: Diagnosis not present

## 2014-05-13 DIAGNOSIS — E871 Hypo-osmolality and hyponatremia: Secondary | ICD-10-CM | POA: Diagnosis not present

## 2014-05-13 DIAGNOSIS — E782 Mixed hyperlipidemia: Secondary | ICD-10-CM | POA: Diagnosis not present

## 2014-05-13 DIAGNOSIS — D075 Carcinoma in situ of prostate: Secondary | ICD-10-CM | POA: Diagnosis not present

## 2014-05-13 DIAGNOSIS — I708 Atherosclerosis of other arteries: Secondary | ICD-10-CM | POA: Diagnosis not present

## 2014-05-13 DIAGNOSIS — Z1389 Encounter for screening for other disorder: Secondary | ICD-10-CM | POA: Diagnosis not present

## 2014-05-13 DIAGNOSIS — E039 Hypothyroidism, unspecified: Secondary | ICD-10-CM | POA: Diagnosis not present

## 2014-07-03 ENCOUNTER — Observation Stay (HOSPITAL_COMMUNITY)
Admission: EM | Admit: 2014-07-03 | Discharge: 2014-07-04 | Disposition: A | Discharge status: Home / Self Care | Payer: Medicare Other | Attending: Internal Medicine | Admitting: Internal Medicine

## 2014-07-03 ENCOUNTER — Encounter (HOSPITAL_COMMUNITY): Payer: Self-pay

## 2014-07-03 ENCOUNTER — Emergency Department (HOSPITAL_COMMUNITY): Payer: Medicare Other

## 2014-07-03 DIAGNOSIS — D649 Anemia, unspecified: Secondary | ICD-10-CM | POA: Diagnosis not present

## 2014-07-03 DIAGNOSIS — Z85528 Personal history of other malignant neoplasm of kidney: Secondary | ICD-10-CM | POA: Diagnosis not present

## 2014-07-03 DIAGNOSIS — R0789 Other chest pain: Secondary | ICD-10-CM | POA: Diagnosis not present

## 2014-07-03 DIAGNOSIS — Z7951 Long term (current) use of inhaled steroids: Secondary | ICD-10-CM | POA: Insufficient documentation

## 2014-07-03 DIAGNOSIS — Z7982 Long term (current) use of aspirin: Secondary | ICD-10-CM | POA: Diagnosis not present

## 2014-07-03 DIAGNOSIS — I2510 Atherosclerotic heart disease of native coronary artery without angina pectoris: Secondary | ICD-10-CM | POA: Insufficient documentation

## 2014-07-03 DIAGNOSIS — E039 Hypothyroidism, unspecified: Secondary | ICD-10-CM | POA: Insufficient documentation

## 2014-07-03 DIAGNOSIS — Z8619 Personal history of other infectious and parasitic diseases: Secondary | ICD-10-CM | POA: Diagnosis not present

## 2014-07-03 DIAGNOSIS — Z79899 Other long term (current) drug therapy: Secondary | ICD-10-CM | POA: Insufficient documentation

## 2014-07-03 DIAGNOSIS — K219 Gastro-esophageal reflux disease without esophagitis: Secondary | ICD-10-CM | POA: Diagnosis not present

## 2014-07-03 DIAGNOSIS — Z880 Allergy status to penicillin: Secondary | ICD-10-CM | POA: Insufficient documentation

## 2014-07-03 DIAGNOSIS — E785 Hyperlipidemia, unspecified: Secondary | ICD-10-CM | POA: Insufficient documentation

## 2014-07-03 DIAGNOSIS — Z8546 Personal history of malignant neoplasm of prostate: Secondary | ICD-10-CM | POA: Insufficient documentation

## 2014-07-03 DIAGNOSIS — Z720 Tobacco use: Secondary | ICD-10-CM | POA: Insufficient documentation

## 2014-07-03 DIAGNOSIS — J449 Chronic obstructive pulmonary disease, unspecified: Secondary | ICD-10-CM | POA: Insufficient documentation

## 2014-07-03 DIAGNOSIS — E782 Mixed hyperlipidemia: Secondary | ICD-10-CM | POA: Insufficient documentation

## 2014-07-03 DIAGNOSIS — Z9889 Other specified postprocedural states: Secondary | ICD-10-CM | POA: Insufficient documentation

## 2014-07-03 DIAGNOSIS — I252 Old myocardial infarction: Secondary | ICD-10-CM | POA: Diagnosis not present

## 2014-07-03 DIAGNOSIS — R05 Cough: Secondary | ICD-10-CM | POA: Diagnosis not present

## 2014-07-03 DIAGNOSIS — R079 Chest pain, unspecified: Principal | ICD-10-CM | POA: Insufficient documentation

## 2014-07-03 DIAGNOSIS — F329 Major depressive disorder, single episode, unspecified: Secondary | ICD-10-CM | POA: Insufficient documentation

## 2014-07-03 DIAGNOSIS — I6529 Occlusion and stenosis of unspecified carotid artery: Secondary | ICD-10-CM | POA: Diagnosis not present

## 2014-07-03 DIAGNOSIS — E0590 Thyrotoxicosis, unspecified without thyrotoxic crisis or storm: Secondary | ICD-10-CM | POA: Insufficient documentation

## 2014-07-03 DIAGNOSIS — Z8701 Personal history of pneumonia (recurrent): Secondary | ICD-10-CM | POA: Insufficient documentation

## 2014-07-03 DIAGNOSIS — H9190 Unspecified hearing loss, unspecified ear: Secondary | ICD-10-CM | POA: Insufficient documentation

## 2014-07-03 DIAGNOSIS — Z872 Personal history of diseases of the skin and subcutaneous tissue: Secondary | ICD-10-CM | POA: Insufficient documentation

## 2014-07-03 DIAGNOSIS — I10 Essential (primary) hypertension: Secondary | ICD-10-CM | POA: Insufficient documentation

## 2014-07-03 DIAGNOSIS — E038 Other specified hypothyroidism: Secondary | ICD-10-CM | POA: Insufficient documentation

## 2014-07-03 LAB — CBC WITH DIFFERENTIAL/PLATELET
BASOS PCT: 1 % (ref 0–1)
Basophils Absolute: 0.1 10*3/uL (ref 0.0–0.1)
EOS PCT: 4 % (ref 0–5)
Eosinophils Absolute: 0.3 10*3/uL (ref 0.0–0.7)
HEMATOCRIT: 38 % — AB (ref 39.0–52.0)
Hemoglobin: 13.4 g/dL (ref 13.0–17.0)
Lymphocytes Relative: 24 % (ref 12–46)
Lymphs Abs: 1.8 10*3/uL (ref 0.7–4.0)
MCH: 34.1 pg — ABNORMAL HIGH (ref 26.0–34.0)
MCHC: 35.3 g/dL (ref 30.0–36.0)
MCV: 96.7 fL (ref 78.0–100.0)
MONO ABS: 0.6 10*3/uL (ref 0.1–1.0)
MONOS PCT: 8 % (ref 3–12)
NEUTROS PCT: 63 % (ref 43–77)
Neutro Abs: 5 10*3/uL (ref 1.7–7.7)
Platelets: 225 10*3/uL (ref 150–400)
RBC: 3.93 MIL/uL — ABNORMAL LOW (ref 4.22–5.81)
RDW: 14 % (ref 11.5–15.5)
WBC: 7.8 10*3/uL (ref 4.0–10.5)

## 2014-07-03 LAB — TROPONIN I

## 2014-07-03 LAB — BASIC METABOLIC PANEL
Anion gap: 6 (ref 5–15)
BUN: 12 mg/dL (ref 6–23)
CALCIUM: 9.1 mg/dL (ref 8.4–10.5)
CHLORIDE: 103 mmol/L (ref 96–112)
CO2: 26 mmol/L (ref 19–32)
Creatinine, Ser: 1.3 mg/dL (ref 0.50–1.35)
GFR, EST AFRICAN AMERICAN: 58 mL/min — AB (ref >90)
GFR, EST NON AFRICAN AMERICAN: 50 mL/min — AB (ref >90)
GLUCOSE: 112 mg/dL — AB (ref 70–99)
Potassium: 3.8 mmol/L (ref 3.5–5.1)
SODIUM: 135 mmol/L (ref 135–145)

## 2014-07-03 MED ORDER — TIOTROPIUM BROMIDE MONOHYDRATE 18 MCG IN CAPS
18.0000 ug | ORAL_CAPSULE | Freq: Every day | RESPIRATORY_TRACT | Status: DC
  Administered 2014-07-04: 18 ug via RESPIRATORY_TRACT
  Filled 2014-07-03: qty 5

## 2014-07-03 MED ORDER — ISOSORBIDE MONONITRATE ER 60 MG PO TB24
60.0000 mg | ORAL_TABLET | Freq: Two times a day (BID) | ORAL | Status: DC
  Administered 2014-07-03 – 2014-07-04 (×2): 60 mg via ORAL
  Filled 2014-07-03 (×2): qty 1

## 2014-07-03 MED ORDER — ONDANSETRON HCL 4 MG/2ML IJ SOLN: 4.0000 mg | Freq: Four times a day (QID) | INTRAMUSCULAR | Status: DC | PRN

## 2014-07-03 MED ORDER — TRAZODONE HCL 50 MG PO TABS: 100.0000 mg | ORAL_TABLET | Freq: Every evening | ORAL | Status: DC | PRN

## 2014-07-03 MED ORDER — THEOPHYLLINE ER 200 MG PO TB12
100.0000 mg | ORAL_TABLET | Freq: Two times a day (BID) | ORAL | Status: DC
  Administered 2014-07-03 – 2014-07-04 (×2): 100 mg via ORAL
  Filled 2014-07-03 (×2): qty 1

## 2014-07-03 MED ORDER — ALUM & MAG HYDROXIDE-SIMETH 200-200-20 MG/5ML PO SUSP: 30.0000 mL | Freq: Four times a day (QID) | ORAL | Status: DC | PRN

## 2014-07-03 MED ORDER — FERROUS SULFATE 325 (65 FE) MG PO TABS
325.0000 mg | ORAL_TABLET | Freq: Every day | ORAL | Status: DC
  Administered 2014-07-03: 325 mg via ORAL
  Filled 2014-07-03: qty 1

## 2014-07-03 MED ORDER — POLYETHYLENE GLYCOL 3350 17 G PO PACK: 17.0000 g | PACK | Freq: Every day | ORAL | Status: DC | PRN

## 2014-07-03 MED ORDER — NITROGLYCERIN 0.4 MG SL SUBL: 0.4000 mg | SUBLINGUAL_TABLET | SUBLINGUAL | Status: DC | PRN

## 2014-07-03 MED ORDER — MOMETASONE FURO-FORMOTEROL FUM 100-5 MCG/ACT IN AERO
2.0000 | INHALATION_SPRAY | Freq: Two times a day (BID) | RESPIRATORY_TRACT | Status: DC
  Administered 2014-07-03 – 2014-07-04 (×2): 2 via RESPIRATORY_TRACT
  Filled 2014-07-03: qty 8.8

## 2014-07-03 MED ORDER — ASPIRIN 81 MG PO CHEW
81.0000 mg | CHEWABLE_TABLET | Freq: Every morning | ORAL | Status: DC
Start: 2014-07-04 — End: 2014-07-04
  Administered 2014-07-04: 81 mg via ORAL
  Filled 2014-07-03: qty 1

## 2014-07-03 MED ORDER — FAMOTIDINE 20 MG PO TABS
20.0000 mg | ORAL_TABLET | Freq: Every day | ORAL | Status: DC
  Administered 2014-07-03: 20 mg via ORAL
  Filled 2014-07-03: qty 1

## 2014-07-03 MED ORDER — FLUTICASONE PROPIONATE 50 MCG/ACT NA SUSP
2.0000 | Freq: Every day | NASAL | Status: DC
  Administered 2014-07-03 – 2014-07-04 (×2): 2 via NASAL
  Filled 2014-07-03: qty 16

## 2014-07-03 MED ORDER — ACETAMINOPHEN 325 MG PO TABS: 650.0000 mg | ORAL_TABLET | Freq: Four times a day (QID) | ORAL | Status: DC | PRN

## 2014-07-03 MED ORDER — TAMSULOSIN HCL 0.4 MG PO CAPS
0.4000 mg | ORAL_CAPSULE | Freq: Every day | ORAL | Status: DC
  Administered 2014-07-03: 0.4 mg via ORAL
  Filled 2014-07-03: qty 1

## 2014-07-03 MED ORDER — LORATADINE 10 MG PO TABS
10.0000 mg | ORAL_TABLET | Freq: Every day | ORAL | Status: DC
  Administered 2014-07-04: 10 mg via ORAL
  Filled 2014-07-03: qty 1

## 2014-07-03 MED ORDER — LEVOTHYROXINE SODIUM 25 MCG PO TABS
125.0000 ug | ORAL_TABLET | Freq: Every day | ORAL | Status: DC
  Administered 2014-07-04: 125 ug via ORAL
  Filled 2014-07-03 (×2): qty 1

## 2014-07-03 MED ORDER — AMLODIPINE BESYLATE 5 MG PO TABS
10.0000 mg | ORAL_TABLET | Freq: Every morning | ORAL | Status: DC
  Administered 2014-07-04: 10 mg via ORAL
  Filled 2014-07-03: qty 2

## 2014-07-03 MED ORDER — ACETAMINOPHEN 650 MG RE SUPP: 650.0000 mg | Freq: Four times a day (QID) | RECTAL | Status: DC | PRN

## 2014-07-03 MED ORDER — MOMETASONE FURO-FORMOTEROL FUM 100-5 MCG/ACT IN AERO
INHALATION_SPRAY | RESPIRATORY_TRACT | Status: AC
  Filled 2014-07-03: qty 8.8

## 2014-07-03 MED ORDER — ATORVASTATIN CALCIUM 40 MG PO TABS
40.0000 mg | ORAL_TABLET | Freq: Every day | ORAL | Status: DC
  Administered 2014-07-03: 40 mg via ORAL
  Filled 2014-07-03: qty 1

## 2014-07-03 MED ORDER — DOCUSATE SODIUM 100 MG PO CAPS: 100.0000 mg | ORAL_CAPSULE | Freq: Every day | ORAL | Status: DC | PRN

## 2014-07-03 MED ORDER — CITALOPRAM HYDROBROMIDE 20 MG PO TABS
20.0000 mg | ORAL_TABLET | Freq: Every morning | ORAL | Status: DC
  Administered 2014-07-04: 20 mg via ORAL
  Filled 2014-07-03: qty 1

## 2014-07-03 MED ORDER — ENOXAPARIN SODIUM 40 MG/0.4ML ~~LOC~~ SOLN
40.0000 mg | SUBCUTANEOUS | Status: DC
  Administered 2014-07-03: 40 mg via SUBCUTANEOUS
  Filled 2014-07-03: qty 0.4

## 2014-07-03 MED ORDER — ALBUTEROL SULFATE (2.5 MG/3ML) 0.083% IN NEBU: 3.0000 mL | INHALATION_SOLUTION | Freq: Four times a day (QID) | RESPIRATORY_TRACT | Status: DC | PRN

## 2014-07-03 MED ORDER — ONDANSETRON HCL 4 MG PO TABS: 4.0000 mg | ORAL_TABLET | Freq: Four times a day (QID) | ORAL | Status: DC | PRN

## 2014-07-03 MED ORDER — SODIUM CHLORIDE 0.9 % IJ SOLN
3.0000 mL | Freq: Two times a day (BID) | INTRAMUSCULAR | Status: DC
  Administered 2014-07-03 – 2014-07-04 (×2): 3 mL via INTRAVENOUS

## 2014-07-03 NOTE — H&P (Signed)
History and Physical  Michael [name] IWP:[id number] DOB: [birth year] DOA: 07/03/2014  Referring physician: Dr Dewayne Hatch, ED physician PCP: Curlene Labrum, MD   Chief Complaint: Chest pain  HPI: Michael [name] is a 82 y.o. male  With a history of coronary artery disease status post stenting to 3 vessels, history of MI, last stress test done approximately 1 year ago. Also has a history of hypothyroidism, peripheral artery disease with bilateral carotid endarterectomy, hypertension, COPD. Patient planes of chest pain that started this morning at approximately a 8:30 to 9:00 with minimal activity. The chest pain was in the left substernal area that was dull and achy and constant. He did have pain in his left shoulder and arm. The patient was not diaphoretic and did not have shortness of breath and denies nausea. Patient took 3 doses of nitroglycerin, which did not immediately relieved the pain. The pain resolved couple hours later. The patient is currently chest pain-free   Review of Systems:   Pt denies any fevers, chills, nausea, vomiting, palpitations, abdominal pain, diarrhea, constipation, shortness of breath, headache, blurred vision.  Review of systems are otherwise negative  Past Medical History  Diagnosis Date  . Hyperthyroidism 1978    Status post radioactive iodine treatment  . History of herpes zoster 2001  . Vitreous hemorrhage MAY 2009     Right eye  . Peripheral arterial disease   . Mixed hyperlipidemia   . Essential hypertension, benign   . Allergic rhinitis   . COPD (chronic obstructive pulmonary disease)   . History of asbestos exposure   . Depression   . Anemia   . Carotid artery occlusion   . History of myocardial infarction   . Coronary atherosclerosis of native coronary artery     BMS RCA 2003, DES RCA 2004, DES RCA 2005   . GERD (gastroesophageal reflux disease)   . Hemorrhoids   . Head injury, closed, with brief LOC   . HOH (hard of hearing)   .  Hypothyroidism   . Lumbar pain   . Psoriasis   . Prostate cancer     Status post XRT/IMRT 2008 - Dr. Alinda Money  . Renal cancer     Cryoablation in 2012  . Pneumonia     November 2014   Past Surgical History  Procedure Laterality Date  . Popliteal synovial cyst excision      knee  . Cervical disc surgery  APRIL 2004    fusion  . Partial knee arthroplasty  JULY 2005    Right-replacement  . Nasal septum surgery      SMR  . Neck disk fusion  07/2002  . Knee arthroplasty      bilateral  . Endarterectomy  08/30/2011    Procedure: Left ENDARTERECTOMY CAROTID;  Surgeon: Conrad [place], MD;  Location: [place];  Service: Vascular;  Laterality: Left;  . Joint replacement Right 2005    Right partial Knee  . Joint replacement Left 2011    Left toatal Knee  . Endarterectomy Right 12/18/2012    Procedure: ENDARTERECTOMY CAROTID WITH BOVINE PATCH ANGIOPLASTY;  Surgeon: Conrad [place], MD;  Location: [place];  Service: Vascular;  Laterality: Right;  . Carotid endarterectomy    . Cholecystectomy  1995  . Cardiac catheterization      AUG 2003, AUG 2004, OCT 2005 9 HAS 3 STENTS DONE AT 3 DIFFERENT CATHS - ONE IS DRUG ELUTING STENT; CATH 2006, 2007, 2009 2010  . Eye surgery  2010  bilat cataract.2009-Vitreous Hemorrhage-2009  . Total knee revision Right 08/06/2013    Procedure: REVISION RIGHT TOTAL KNEE ARTHROPLASTY  ;  Surgeon: Gearlean Alf, MD;  Location: [facility];  Service: Orthopedics;  Laterality: Right;   Social History:  reports that he has been smoking Cigarettes.  He started smoking about 70 years ago. He has a 15 pack-year smoking history. He has never used smokeless tobacco. He reports that he does not drink alcohol or use illicit drugs. Patient lives at home & is able to participate in activities of daily living  Allergies  Allergen Reactions  . Ciprofloxacin     REACTION: nausea  . Iron Itching  . Lyrica [Pregabalin] Other (See Comments)    Unknown   . Morphine And Related     Unknown     . Oxycodone     hives  . Penicillins     REACTION: hives  . Hydrocodone-Acetaminophen Rash    Confusion    Family History  Problem Relation Age of Onset  . Coronary artery disease Brother   . Cancer Brother   . Heart disease Brother   . Heart attack Brother   . Anesthesia problems Neg Hx      Prior to Admission medications   Medication Sig Start Date End Date Taking? Authorizing Provider  albuterol (PROAIR HFA) 108 (90 BASE) MCG/ACT inhaler Inhale 2 puffs into the lungs every 6 (six) hours as needed. For shortness of breath    Historical Provider, MD  amLODipine (NORVASC) 10 MG tablet Take 10 mg by mouth every morning.     Historical Provider, MD  aspirin 81 MG tablet Take 81 mg by mouth daily.    Historical Provider, MD  atorvastatin (LIPITOR) 40 MG tablet Take 40 mg by mouth at bedtime.     Historical Provider, MD  cetirizine (ZYRTEC) 10 MG tablet Take 10 mg by mouth daily.     Historical Provider, MD  citalopram (CELEXA) 20 MG tablet Take 20 mg by mouth every morning.     Historical Provider, MD  clobetasol cream (TEMOVATE) 3.23 % Apply 1 application topically as needed. Applied to legs for psoriasis    Historical Provider, MD  ferrous sulfate 325 (65 FE) MG tablet Take 325 mg by mouth at bedtime.     Historical Provider, MD  Fluticasone-Salmeterol (ADVAIR DISKUS) 250-50 MCG/DOSE AEPB Inhale 1 puff into the lungs every 12 (twelve) hours.      Historical Provider, MD  FLUZONE HIGH-DOSE 0.5 ML SUSY Inject 0.5 mLs into the muscle once. 01/21/14   Historical Provider, MD  isosorbide mononitrate (IMDUR) 60 MG 24 hr tablet Take 1 tablet (60 mg total) by mouth 2 (two) times daily. 08/29/13   Satira Sark, MD  levothyroxine (SYNTHROID, LEVOTHROID) 125 MCG tablet Take 125 mcg by mouth every morning.     Historical Provider, MD  mometasone (NASONEX) 50 MCG/ACT nasal spray Place 2 sprays into the nose at bedtime. 11/24/10   Deneise Lever, MD  Multiple Vitamin (MULTIVITAMIN) tablet Take 1  tablet by mouth daily.    Historical Provider, MD  nitroGLYCERIN (NITROSTAT) 0.4 MG SL tablet Place 1 tablet (0.4 mg total) under the tongue every 5 (five) minutes as needed. For chest pain 12/23/13   Satira Sark, MD  Omega 3 1200 MG CAPS Take by mouth. Take 2 capsules in AM and 2 capsules in  PM    Historical Provider, MD  polyethylene glycol (MIRALAX / GLYCOLAX) packet Take 17 g by mouth  daily as needed for mild constipation. 08/06/13   Amber Renelda Loma, PA-C  PREVNAR 13 SUSP injection Inject 0.5 mLs into the muscle once. 01/21/14   Historical Provider, MD  ranitidine (ZANTAC) 150 MG tablet Take 150 mg by mouth every morning.     Historical Provider, MD  Tamsulosin HCl (FLOMAX) 0.4 MG CAPS Take 0.4 mg by mouth at bedtime.     Historical Provider, MD  theophylline (THEODUR) 100 MG 12 hr tablet Take 1 tablet (100 mg total) by mouth 2 (two) times daily. 10/07/13   Deneise Lever, MD  tiotropium (SPIRIVA) 18 MCG inhalation capsule Place 18 mcg into inhaler and inhale daily. 08/01/12   Deneise Lever, MD  traZODone (DESYREL) 100 MG tablet Take 100 mg by mouth at bedtime as needed. For sleep    Historical Provider, MD    Physical Exam: BP 138/64 mmHg  Pulse 99  Temp(Src) 97.8 F (36.6 C) (Oral)  Resp 16  SpO2 95%  General: Elderly Caucasian male. Awake and alert and oriented x3. No acute cardiopulmonary distress.  Eyes: Pupils equal, round, reactive to light. Extraocular muscles are intact. Sclerae anicteric and noninjected.  ENT: Moist mucosal membranes. No mucosal lesions.   Neck: Neck supple without lymphadenopathy. No carotid bruits. No masses palpated.  Cardiovascular: Regular rate with normal S1-S2 sounds. No murmurs, rubs, gallops auscultated. No JVD.  Respiratory: Prolonged exhalation phase with mildly diminished breath sounds. No rales, rhonchi. Lungs clear to auscultation bilaterally.  Abdomen: Soft, nontender, nondistended. Active bowel sounds. No masses or hepatosplenomegaly    Skin: Dry, warm to touch. 2+ dorsalis pedis and radial pulses. Musculoskeletal: No calf or leg pain. All major joints not erythematous nontender.  Psychiatric: Intact judgment and insight.  Neurologic: No focal neurological deficits. Cranial nerves II through XII are grossly intact.           Labs on Admission:  Basic Metabolic Panel:  Recent Labs Lab 07/03/14 1402  NA 135  K 3.8  CL 103  CO2 26  GLUCOSE 112*  BUN 12  CREATININE 1.30  CALCIUM 9.1   Liver Function Tests: No results for input(s): AST, ALT, ALKPHOS, BILITOT, PROT, ALBUMIN in the last 168 hours. No results for input(s): LIPASE, AMYLASE in the last 168 hours. No results for input(s): AMMONIA in the last 168 hours. CBC:  Recent Labs Lab 07/03/14 1402  WBC 7.8  NEUTROABS 5.0  HGB 13.4  HCT 38.0*  MCV 96.7  PLT 225   Cardiac Enzymes:  Recent Labs Lab 07/03/14 1402  TROPONINI <0.03    BNP (last 3 results) No results for input(s): BNP in the last 8760 hours.  ProBNP (last 3 results)  Recent Labs  02/15/14 1255  PROBNP 98.4    CBG: No results for input(s): GLUCAP in the last 168 hours.  Radiological Exams on Admission: Dg Chest 2 View  07/03/2014   CLINICAL DATA:  LEFT anterior chest pain. Productive cough. Short of breath since this morning.  EXAM: CHEST  2 VIEW  COMPARISON:  02/13/2014.  FINDINGS: The cardiopericardial silhouette is within normal limits. There is no pneumothorax. Lower cervical ACDF. Emphysema is present with hyper expansion of the chest. Cholecystectomy clips are present in the right upper quadrant. No airspace disease. No pleural effusion. Mid thoracic spondylosis with possible ankylosis. Nodular scarring in the lateral LEFT lung base.  IMPRESSION: Emphysema without acute cardiopulmonary disease.   Electronically Signed   By: Dereck Ligas M.D.   On: 07/03/2014 14:07  EKG: Independently reviewed. EKG shows a first-degree AV block with a bradycardic rate. PR interval is  0.222.  No ST changes. This is consistent with an earlier EKG in October. Additionally, the patient's current telemetry monitoring shows no AV block.  Assessment/Plan Present on Admission:  . Chest pain  #1 chest pain #2 artery artery disease with multiple stenting #3 peripheral artery disease #4 COPD #5 intermittent AV block  We'll admit for observation and rule out with serial troponins Continue telemetry monitoring The patient is currently asymptomatic, if patient rules out he could follow-up with cardiology in short interval for stress testing or other testing per cardiology  DVT prophylaxis: Lovenox  Consultants: None  Code Status: Full code  Family Communication: Son in room   Disposition Plan: Home following observation  Time spent: 50 minutes was spent with face-to-face time with patient with at least 50% with counseling and coordination of care  Truett Mainland, DO [facility] Pager [phone]

## 2014-07-03 NOTE — ED Notes (Signed)
Called to give report at this time. RN unable to take report

## 2014-07-03 NOTE — ED Notes (Signed)
VSS, Patient states pain is "better", family sitting with patient in main ED waiting area.

## 2014-07-03 NOTE — ED Notes (Signed)
Patient c/o of aching pain over his left chest area, which began at 1030 today. NO relieving or aggravating factors. Denies all associated symptoms. Patient states was seen for same around 6 months ago, which turned out to be "nothing". NAD.

## 2014-07-03 NOTE — ED Provider Notes (Signed)
CSN: [id number]     Arrival date & time 07/03/14  1328 History  This chart was scribed for Maudry Diego, MD by Edison Simon, ED Scribe. This patient was seen in room APA06/APA06 and the patient's care was started at 4:57 PM.   Chief Complaint  Patient presents with  . Chest Pain   Patient is a 81 y.o. male presenting with chest pain. The history is provided by the patient. No language interpreter was used.  Chest Pain Pain location:  L chest Pain quality: stabbing   Pain radiates to:  Does not radiate Pain radiates to the back: no   Pain severity:  Moderate Onset quality:  Sudden Duration: "a few hours" Timing:  Constant Progression:  Resolved Chronicity:  Recurrent Context comment:  Onset while brushing teeth Relieved by:  Nitroglycerin Worsened by:  Nothing tried Ineffective treatments:  None tried Associated symptoms: no abdominal pain, no back pain, no fatigue, no headache and no shortness of breath   Risk factors: coronary artery disease     HPI Comments: Michael [name] is a 81 y.o. male who presents to the Emergency Department complaining of stabbing left chest pain with onset this morning while brushing teeth, lasting a few hours but resolved at this time. He used 3 doses of nitroglycerin which improved it some, but did not resolve until a few hours after using it. He states he had similar pain previously approximately 6-8 months ago. He states he has not had any similar pains since then. He was evaluated at that time and states he was diagnosed with angina. He has a cardiologist, Dr. Domenic Polite, last seen 6 months ago. He has had 3 stents placed. He denies SOB.  Past Medical History  Diagnosis Date  . Hyperthyroidism 1978    Status post radioactive iodine treatment  . History of herpes zoster 2001  . Vitreous hemorrhage MAY 2009     Right eye  . Peripheral arterial disease   . Mixed hyperlipidemia   . Essential hypertension, benign   . Allergic rhinitis   . COPD  (chronic obstructive pulmonary disease)   . History of asbestos exposure   . Depression   . Anemia   . Carotid artery occlusion   . History of myocardial infarction   . Coronary atherosclerosis of native coronary artery     BMS RCA 2003, DES RCA 2004, DES RCA 2005   . GERD (gastroesophageal reflux disease)   . Hemorrhoids   . Head injury, closed, with brief LOC   . HOH (hard of hearing)   . Hypothyroidism   . Lumbar pain   . Psoriasis   . Prostate cancer     Status post XRT/IMRT 2008 - Dr. Alinda Money  . Renal cancer     Cryoablation in 2012  . Pneumonia     November 2014   Past Surgical History  Procedure Laterality Date  . Popliteal synovial cyst excision      knee  . Cervical disc surgery  APRIL 2004    fusion  . Partial knee arthroplasty  JULY 2005    Right-replacement  . Nasal septum surgery      SMR  . Neck disk fusion  07/2002  . Knee arthroplasty      bilateral  . Endarterectomy  08/30/2011    Procedure: Left ENDARTERECTOMY CAROTID;  Surgeon: Conrad [place], MD;  Location: [place];  Service: Vascular;  Laterality: Left;  . Joint replacement Right 2005    Right partial Knee  .  Joint replacement Left 2011    Left toatal Knee  . Endarterectomy Right 12/18/2012    Procedure: ENDARTERECTOMY CAROTID WITH BOVINE PATCH ANGIOPLASTY;  Surgeon: Conrad [place], MD;  Location: [place];  Service: Vascular;  Laterality: Right;  . Carotid endarterectomy    . Cholecystectomy  1995  . Cardiac catheterization      AUG 2003, AUG 2004, OCT 2005 9 HAS 3 STENTS DONE AT 3 DIFFERENT CATHS - ONE IS DRUG ELUTING STENT; CATH 2006, 2007, 2009 2010  . Eye surgery  2010    bilat cataract.2009-Vitreous Hemorrhage-2009  . Total knee revision Right 08/06/2013    Procedure: REVISION RIGHT TOTAL KNEE ARTHROPLASTY  ;  Surgeon: Gearlean Alf, MD;  Location: [facility];  Service: Orthopedics;  Laterality: Right;   Family History  Problem Relation Age of Onset  . Coronary artery disease Brother   . Cancer Brother    . Heart disease Brother   . Heart attack Brother   . Anesthesia problems Neg Hx    History  Substance Use Topics  . Smoking status: Current Every Day Smoker -- 0.25 packs/day for 60 years    Types: Cigarettes    Start date: 10/20/1943  . Smokeless tobacco: Never Used  . Alcohol Use: No     Comment: TRYING TO QUIT SMOKING    Review of Systems  Constitutional: Negative for appetite change and fatigue.  HENT: Negative for congestion, ear discharge and sinus pressure.   Eyes: Negative for discharge.  Respiratory: Negative for shortness of breath.   Cardiovascular: Positive for chest pain.  Gastrointestinal: Negative for abdominal pain and diarrhea.  Genitourinary: Negative for frequency and hematuria.  Musculoskeletal: Negative for back pain.  Skin: Negative for rash.  Neurological: Negative for seizures and headaches.  Psychiatric/Behavioral: Negative for hallucinations.      Allergies  Ciprofloxacin; Iron; Lyrica; Morphine and related; Oxycodone; Penicillins; and Hydrocodone-acetaminophen  Home Medications   Prior to Admission medications   Medication Sig Start Date End Date Taking? Authorizing Provider  albuterol (PROAIR HFA) 108 (90 BASE) MCG/ACT inhaler Inhale 2 puffs into the lungs every 6 (six) hours as needed. For shortness of breath    Historical Provider, MD  amLODipine (NORVASC) 10 MG tablet Take 10 mg by mouth every morning.     Historical Provider, MD  aspirin 81 MG tablet Take 81 mg by mouth daily.    Historical Provider, MD  atorvastatin (LIPITOR) 40 MG tablet Take 40 mg by mouth at bedtime.     Historical Provider, MD  cetirizine (ZYRTEC) 10 MG tablet Take 10 mg by mouth daily.     Historical Provider, MD  citalopram (CELEXA) 20 MG tablet Take 20 mg by mouth every morning.     Historical Provider, MD  clobetasol cream (TEMOVATE) 0.96 % Apply 1 application topically as needed. Applied to legs for psoriasis    Historical Provider, MD  ferrous sulfate 325 (65  FE) MG tablet Take 325 mg by mouth at bedtime.     Historical Provider, MD  Fluticasone-Salmeterol (ADVAIR DISKUS) 250-50 MCG/DOSE AEPB Inhale 1 puff into the lungs every 12 (twelve) hours.      Historical Provider, MD  FLUZONE HIGH-DOSE 0.5 ML SUSY Inject 0.5 mLs into the muscle once. 01/21/14   Historical Provider, MD  isosorbide mononitrate (IMDUR) 60 MG 24 hr tablet Take 1 tablet (60 mg total) by mouth 2 (two) times daily. 08/29/13   Satira Sark, MD  levothyroxine (SYNTHROID, LEVOTHROID) 125 MCG tablet Take 125  mcg by mouth every morning.     Historical Provider, MD  mometasone (NASONEX) 50 MCG/ACT nasal spray Place 2 sprays into the nose at bedtime. 11/24/10   Deneise Lever, MD  Multiple Vitamin (MULTIVITAMIN) tablet Take 1 tablet by mouth daily.    Historical Provider, MD  nitroGLYCERIN (NITROSTAT) 0.4 MG SL tablet Place 1 tablet (0.4 mg total) under the tongue every 5 (five) minutes as needed. For chest pain 12/23/13   Satira Sark, MD  Omega 3 1200 MG CAPS Take by mouth. Take 2 capsules in AM and 2 capsules in  PM    Historical Provider, MD  polyethylene glycol (MIRALAX / GLYCOLAX) packet Take 17 g by mouth daily as needed for mild constipation. 08/06/13   Amber Renelda Loma, PA-C  PREVNAR 13 SUSP injection Inject 0.5 mLs into the muscle once. 01/21/14   Historical Provider, MD  ranitidine (ZANTAC) 150 MG tablet Take 150 mg by mouth every morning.     Historical Provider, MD  Tamsulosin HCl (FLOMAX) 0.4 MG CAPS Take 0.4 mg by mouth at bedtime.     Historical Provider, MD  theophylline (THEODUR) 100 MG 12 hr tablet Take 1 tablet (100 mg total) by mouth 2 (two) times daily. 10/07/13   Deneise Lever, MD  tiotropium (SPIRIVA) 18 MCG inhalation capsule Place 18 mcg into inhaler and inhale daily. 08/01/12   Deneise Lever, MD  traZODone (DESYREL) 100 MG tablet Take 100 mg by mouth at bedtime as needed. For sleep    Historical Provider, MD   BP 138/64 mmHg  Pulse 99  Temp(Src) 97.8 F  (36.6 C) (Oral)  Resp 16  SpO2 95% Physical Exam  Constitutional: He is oriented to person, place, and time. He appears well-developed.  HENT:  Head: Normocephalic.  Eyes: Conjunctivae and EOM are normal. No scleral icterus.  Neck: Neck supple. No thyromegaly present.  Cardiovascular: Normal rate and regular rhythm.  Exam reveals no gallop and no friction rub.   No murmur heard. Pulmonary/Chest: No stridor. He has no wheezes. He has no rales. He exhibits no tenderness.  Abdominal: He exhibits no distension. There is no tenderness. There is no rebound.  Musculoskeletal: Normal range of motion. He exhibits no edema.  Lymphadenopathy:    He has no cervical adenopathy.  Neurological: He is oriented to person, place, and time. He exhibits normal muscle tone. Coordination normal.  Skin: No rash noted. No erythema.  Psychiatric: He has a normal mood and affect. His behavior is normal.    ED Course  Procedures (including critical care time)  DIAGNOSTIC STUDIES: Oxygen Saturation is 95% on room air, adequate by my interpretation.    COORDINATION OF CARE: 5:03 PM Discussed treatment plan with patient at beside, the patient agrees with the plan and has no further questions at this time.   Labs Review Labs Reviewed  BASIC METABOLIC PANEL - Abnormal; Notable for the following:    Glucose, Bld 112 (*)    GFR calc non Af Amer 50 (*)    GFR calc Af Amer 58 (*)    All other components within normal limits  CBC WITH DIFFERENTIAL/PLATELET - Abnormal; Notable for the following:    RBC 3.93 (*)    HCT 38.0 (*)    MCH 34.1 (*)    All other components within normal limits  TROPONIN I    Imaging Review Dg Chest 2 View  07/03/2014   CLINICAL DATA:  LEFT anterior chest pain. Productive cough. Short of  breath since this morning.  EXAM: CHEST  2 VIEW  COMPARISON:  02/13/2014.  FINDINGS: The cardiopericardial silhouette is within normal limits. There is no pneumothorax. Lower cervical ACDF.  Emphysema is present with hyper expansion of the chest. Cholecystectomy clips are present in the right upper quadrant. No airspace disease. No pleural effusion. Mid thoracic spondylosis with possible ankylosis. Nodular scarring in the lateral LEFT lung base.  IMPRESSION: Emphysema without acute cardiopulmonary disease.   Electronically Signed   By: Dereck Ligas M.D.   On: 07/03/2014 14:07     EKG Interpretation   Date/Time:  Friday July 03 2014 13:31:28 EST Ventricular Rate:  66 PR Interval:  222 QRS Duration: 92 QT Interval:  388 QTC Calculation: 406 R Axis:   -7 Text Interpretation:  Sinus rhythm with 1st degree A-V block with  occasional Premature ventricular complexes Low voltage QRS Cannot rule out  Anterior infarct , age undetermined Abnormal ECG Confirmed by COOK  MD,  BRIAN ([id number]) on 07/03/2014 1:38:18 PM      MDM   Final diagnoses:  None   Admit chest pain  The chart was scribed for me under my direct supervision.  I personally performed the history, physical, and medical decision making and all procedures in the evaluation of this patient.Maudry Diego, MD 07/03/14 [phone]

## 2014-07-03 NOTE — ED Notes (Signed)
RN unable to take report at this time 

## 2014-07-03 NOTE — ED Notes (Signed)
12 lead EKG completed, given to Dr. Lacinda Axon. Dr. Lacinda Axon informed of patient's history and clinical presentation, okay to go to waiting room. Patient and son instructed to inform staff of any changes.

## 2014-07-04 DIAGNOSIS — E785 Hyperlipidemia, unspecified: Secondary | ICD-10-CM | POA: Diagnosis not present

## 2014-07-04 DIAGNOSIS — E038 Other specified hypothyroidism: Secondary | ICD-10-CM

## 2014-07-04 DIAGNOSIS — I10 Essential (primary) hypertension: Secondary | ICD-10-CM | POA: Insufficient documentation

## 2014-07-04 DIAGNOSIS — R079 Chest pain, unspecified: Secondary | ICD-10-CM | POA: Diagnosis not present

## 2014-07-04 LAB — TROPONIN I: Troponin I: <0.03 ng/mL (ref <0.031)

## 2014-07-04 NOTE — Discharge Summary (Signed)
Physician Discharge Summary  Michael [name] UVO:[id number] DOB: 03-24-1932 DOA: 07/03/2014  PCP: Curlene Labrum, MD  Admit date: 07/03/2014 Discharge date: 07/04/2014  Recommendations for Outpatient Follow-up:  1. Pt will follow up with pcp per sch appt to make sure symptoms are stable. His trop level were WNL. He may need 2 D ECHO to complete evaluation but as of now his chest pain has resolved and he wants to go home. Order placed for discharge home.  Discharge Diagnoses:  Active Problems:   Chest pain    Discharge Condition: stable   Diet recommendation: as tolerated   History of present illness:  81 y.o. male with a history of coronary artery disease status post stenting to 3 vessels, history of MI, last stress test done approximately 1 year ago, hypothyroidism, peripheral artery disease with bilateral carotid endarterectomy, hypertension, COPD.  Patient presented with chest pain that started on the morning of the admission, at rest and aggravated with exertion. Pain was in left side of the chest, constant, 6/10 in intensity, non radiating. No diaphoresis, no shortness of breath or palpitations. No alleviating factors. No N/V. On admission, his 12 lead EKG showed sinus rhythm, the first trop was normal. He was admitted for chest pain evaluation.    Hospital Course:   Active Problems: Chest pain - troponin x 3 negative - the 12 lead EKG showed sinus rhythm - will have pcp follow up and have them arrange outpt 2 D ECHO - resume aspirin daily, statin and omega 3  HTN - resume Norvasc, Imdur on discharge   COPD - Stable - resume Advair, theophylline, Spiriva on discharge    BPH - continue flomax   Signed:  Leisa Lenz, MD  [facility] 07/04/2014, 8:01 AM  Pager #: [phone]  Procedures:  None   Consultations:  None   Discharge Exam: Filed Vitals:   07/04/14 0547  BP: 130/74  Pulse: 57  Temp: 98.1 F (36.7 C)  Resp: 18   Filed Vitals:   07/03/14 1930 07/03/14 2023 07/04/14 0547 07/04/14 0628  BP: 134/69 142/73 130/74   Pulse:  62 57   Temp:  97.8 F (36.6 C) 98.1 F (36.7 C)   TempSrc:  Oral Oral   Resp: 17 16 18    Height:    5\' 7"  (1.702 m)  Weight:    68.4 kg (150 lb 12.7 oz)  SpO2:  97% 96%     General: Pt is alert, follows commands appropriately, not in acute distress Cardiovascular: Regular rate and rhythm, S1/S2 +, no murmurs Respiratory: Clear to auscultation bilaterally, no wheezing, no crackles, no rhonchi Abdominal: Soft, non tender, non distended, bowel sounds +, no guarding Extremities: no edema, no cyanosis, pulses palpable bilaterally DP and PT Neuro: Grossly nonfocal  Discharge Instructions  Discharge Instructions    Call MD for:  difficulty breathing, headache or visual disturbances    Complete by:  As directed      Call MD for:  persistant nausea and vomiting    Complete by:  As directed      Call MD for:  severe uncontrolled pain    Complete by:  As directed      Diet - low sodium heart healthy    Complete by:  As directed      Increase activity slowly    Complete by:  As directed             Medication List    TAKE these medications  ADVAIR DISKUS 250-50 MCG/DOSE Aepb  Generic drug:  Fluticasone-Salmeterol  Inhale 1 puff into the lungs every 12 (twelve) hours.     amLODipine 10 MG tablet  Commonly known as:  NORVASC  Take 10 mg by mouth every morning.     aspirin 81 MG tablet  Take 81 mg by mouth every morning.     atorvastatin 40 MG tablet  Commonly known as:  LIPITOR  Take 40 mg by mouth at bedtime.     cetirizine 10 MG tablet  Commonly known as:  ZYRTEC  Take 10 mg by mouth every morning.     citalopram 20 MG tablet  Commonly known as:  CELEXA  Take 20 mg by mouth every morning.     clobetasol cream 0.05 %  Commonly known as:  TEMOVATE  Apply 1 application topically as needed. Applied to legs for psoriasis     ferrous sulfate 325 (65 FE) MG tablet  Take  325 mg by mouth at bedtime.     FLOMAX 0.4 MG Caps capsule  Generic drug:  tamsulosin  Take 0.4 mg by mouth at bedtime.     isosorbide mononitrate 60 MG 24 hr tablet  Commonly known as:  IMDUR  Take 1 tablet (60 mg total) by mouth 2 (two) times daily.     levothyroxine 125 MCG tablet  Commonly known as:  SYNTHROID, LEVOTHROID  Take 125 mcg by mouth every morning.     mometasone 50 MCG/ACT nasal spray  Commonly known as:  NASONEX  Place 2 sprays into the nose at bedtime.     multivitamin tablet  Take 1 tablet by mouth every morning.     nitroGLYCERIN 0.4 MG SL tablet  Commonly known as:  NITROSTAT  Place 1 tablet (0.4 mg total) under the tongue every 5 (five) minutes as needed. For chest pain     Omega 3 1200 MG Caps  Take 2 capsules by mouth 2 (two) times daily. Take 2 capsules in AM and 2 capsules in  PM     polyethylene glycol packet  Commonly known as:  MIRALAX / GLYCOLAX  Take 17 g by mouth daily as needed for mild constipation.     PROAIR HFA 108 (90 BASE) MCG/ACT inhaler  Generic drug:  albuterol  Inhale 2 puffs into the lungs every 6 (six) hours as needed. For shortness of breath     ranitidine 150 MG tablet  Commonly known as:  ZANTAC  Take 150 mg by mouth every morning.     theophylline 100 MG 12 hr tablet  Commonly known as:  THEODUR  Take 1 tablet (100 mg total) by mouth 2 (two) times daily.     tiotropium 18 MCG inhalation capsule  Commonly known as:  SPIRIVA  Place 18 mcg into inhaler and inhale daily.     traZODone 100 MG tablet  Commonly known as:  DESYREL  Take 100 mg by mouth at bedtime as needed. For sleep           Follow-up Information    Follow up with Curlene Labrum, MD. Schedule an appointment as soon as possible for a visit in 2 weeks.   Why:  Follow up appt after recent hospitalization   Contact information:   [place] [id number] [phone]        The results of significant diagnostics from this hospitalization  (including imaging, microbiology, ancillary and laboratory) are listed below for reference.    Significant Diagnostic Studies:  Dg Chest 2 View  07/03/2014   CLINICAL DATA:  LEFT anterior chest pain. Productive cough. Short of breath since this morning.  EXAM: CHEST  2 VIEW  COMPARISON:  02/13/2014.  FINDINGS: The cardiopericardial silhouette is within normal limits. There is no pneumothorax. Lower cervical ACDF. Emphysema is present with hyper expansion of the chest. Cholecystectomy clips are present in the right upper quadrant. No airspace disease. No pleural effusion. Mid thoracic spondylosis with possible ankylosis. Nodular scarring in the lateral LEFT lung base.  IMPRESSION: Emphysema without acute cardiopulmonary disease.   Electronically Signed   By: Dereck Ligas M.D.   On: 07/03/2014 14:07    Microbiology: No results found for this or any previous visit (from the past 240 hour(s)).   Labs: Basic Metabolic Panel:  Recent Labs Lab 07/03/14 1402  NA 135  K 3.8  CL 103  CO2 26  GLUCOSE 112*  BUN 12  CREATININE 1.30  CALCIUM 9.1   Liver Function Tests: No results for input(s): AST, ALT, ALKPHOS, BILITOT, PROT, ALBUMIN in the last 168 hours. No results for input(s): LIPASE, AMYLASE in the last 168 hours. No results for input(s): AMMONIA in the last 168 hours. CBC:  Recent Labs Lab 07/03/14 1402  WBC 7.8  NEUTROABS 5.0  HGB 13.4  HCT 38.0*  MCV 96.7  PLT 225   Cardiac Enzymes:  Recent Labs Lab 07/03/14 1402 07/03/14 2215 07/04/14 0306  TROPONINI <0.03 <0.03 <0.03   BNP: BNP (last 3 results) No results for input(s): BNP in the last 8760 hours.  ProBNP (last 3 results)  Recent Labs  02/15/14 1255  PROBNP 98.4    CBG: No results for input(s): GLUCAP in the last 168 hours.  Time coordinating discharge: Over 30 minutes

## 2014-07-04 NOTE — Discharge Instructions (Signed)
Chest Pain Observation  It is often hard to give a specific diagnosis for the cause of chest pain. Among other possibilities your symptoms might be caused by inadequate oxygen delivery to your heart (angina). Angina that is not treated or evaluated can lead to a heart attack (myocardial infarction) or death.  Blood tests, electrocardiograms, and X-rays may have been done to help determine a possible cause of your chest pain. After evaluation and observation, your health care provider has determined that it is unlikely your pain was caused by an unstable condition that requires hospitalization. However, a full evaluation of your pain may need to be completed, with additional diagnostic testing as directed. It is very important to keep your follow-up appointments. Not keeping your follow-up appointments could result in permanent heart damage, disability, or death. If there is any problem keeping your follow-up appointments, you must call your health care provider.  HOME CARE INSTRUCTIONS   Due to the slight chance that your pain could be angina, it is important to follow your health care provider's treatment plan and also maintain a healthy lifestyle:  · Maintain or work toward achieving a healthy weight.  · Stay physically active and exercise regularly.  · Decrease your salt intake.  · Eat a balanced, healthy diet. Talk to a dietitian to learn about heart-healthy foods.  · Increase your fiber intake by including whole grains, vegetables, fruits, and nuts in your diet.  · Avoid situations that cause stress, anger, or depression.  · Take medicines as advised by your health care provider. Report any side effects to your health care provider. Do not stop medicines or adjust the dosages on your own.  · Quit smoking. Do not use nicotine patches or gum until you check with your health care provider.  · Keep your blood pressure, blood sugar, and cholesterol levels within normal limits.  · Limit alcohol intake to no more than  1 drink per day for women who are not pregnant and 2 drinks per day for men.  · Do not abuse drugs.  SEEK IMMEDIATE MEDICAL CARE IF:  You have severe chest pain or pressure which may include symptoms such as:  · You feel pain or pressure in your arms, neck, jaw, or back.  · You have severe back or abdominal pain, feel sick to your stomach (nauseous), or throw up (vomit).  · You are sweating profusely.  · You are having a fast or irregular heartbeat.  · You feel short of breath while at rest.  · You notice increasing shortness of breath during rest, sleep, or with activity.  · You have chest pain that does not get better after rest or after taking your usual medicine.  · You wake from sleep with chest pain.  · You are unable to sleep because you cannot breathe.  · You develop a frequent cough or you are coughing up blood.  · You feel dizzy, faint, or experience extreme fatigue.  · You develop severe weakness, dizziness, fainting, or chills.  Any of these symptoms may represent a serious problem that is an emergency. Do not wait to see if the symptoms will go away. Call your local emergency services (911 in the [place] Do not drive yourself to the hospital.  MAKE SURE YOU:  · Understand these instructions.  · Will watch your condition.  · Will get help right away if you are not doing well or get worse.  Document Released: 05/20/2010 Document Revised: 04/22/2013 Document Reviewed: 10/17/2012    Exit[employer]® Patient Information ©2015 [employer]. This information is not intended to replace advice given to you by your health care provider. Make sure you discuss any questions you have with your health care provider.

## 2014-07-04 NOTE — Progress Notes (Signed)
UR completed 

## 2014-07-06 ENCOUNTER — Telehealth: Payer: Self-pay | Admitting: Cardiology

## 2014-07-06 NOTE — Telephone Encounter (Signed)
Pt  Daughter made aware that d/c summary states f/u with Burdine in 2 weeks and f/u with Dr. Domenic Polite in June as scheduled

## 2014-07-07 DIAGNOSIS — K219 Gastro-esophageal reflux disease without esophagitis: Secondary | ICD-10-CM | POA: Diagnosis not present

## 2014-07-07 DIAGNOSIS — F17210 Nicotine dependence, cigarettes, uncomplicated: Secondary | ICD-10-CM | POA: Diagnosis not present

## 2014-07-07 DIAGNOSIS — J439 Emphysema, unspecified: Secondary | ICD-10-CM | POA: Diagnosis not present

## 2014-07-07 DIAGNOSIS — J302 Other seasonal allergic rhinitis: Secondary | ICD-10-CM | POA: Diagnosis not present

## 2014-07-07 DIAGNOSIS — I10 Essential (primary) hypertension: Secondary | ICD-10-CM | POA: Diagnosis not present

## 2014-07-07 DIAGNOSIS — I708 Atherosclerosis of other arteries: Secondary | ICD-10-CM | POA: Diagnosis not present

## 2014-07-22 ENCOUNTER — Other Ambulatory Visit (HOSPITAL_COMMUNITY): Payer: Self-pay | Admitting: Urology

## 2014-07-22 DIAGNOSIS — C61 Malignant neoplasm of prostate: Secondary | ICD-10-CM

## 2014-08-04 DIAGNOSIS — E782 Mixed hyperlipidemia: Secondary | ICD-10-CM | POA: Diagnosis not present

## 2014-08-04 DIAGNOSIS — E039 Hypothyroidism, unspecified: Secondary | ICD-10-CM | POA: Diagnosis not present

## 2014-08-04 DIAGNOSIS — K219 Gastro-esophageal reflux disease without esophagitis: Secondary | ICD-10-CM | POA: Diagnosis not present

## 2014-08-04 DIAGNOSIS — I10 Essential (primary) hypertension: Secondary | ICD-10-CM | POA: Diagnosis not present

## 2014-08-11 DIAGNOSIS — N400 Enlarged prostate without lower urinary tract symptoms: Secondary | ICD-10-CM | POA: Diagnosis not present

## 2014-08-11 DIAGNOSIS — E782 Mixed hyperlipidemia: Secondary | ICD-10-CM | POA: Diagnosis not present

## 2014-08-11 DIAGNOSIS — I10 Essential (primary) hypertension: Secondary | ICD-10-CM | POA: Diagnosis not present

## 2014-08-11 DIAGNOSIS — J439 Emphysema, unspecified: Secondary | ICD-10-CM | POA: Diagnosis not present

## 2014-08-11 DIAGNOSIS — F17210 Nicotine dependence, cigarettes, uncomplicated: Secondary | ICD-10-CM | POA: Diagnosis not present

## 2014-08-11 DIAGNOSIS — E871 Hypo-osmolality and hyponatremia: Secondary | ICD-10-CM | POA: Diagnosis not present

## 2014-08-11 DIAGNOSIS — F329 Major depressive disorder, single episode, unspecified: Secondary | ICD-10-CM | POA: Diagnosis not present

## 2014-08-11 DIAGNOSIS — K219 Gastro-esophageal reflux disease without esophagitis: Secondary | ICD-10-CM | POA: Diagnosis not present

## 2014-08-11 DIAGNOSIS — N183 Chronic kidney disease, stage 3 (moderate): Secondary | ICD-10-CM | POA: Diagnosis not present

## 2014-08-11 DIAGNOSIS — E039 Hypothyroidism, unspecified: Secondary | ICD-10-CM | POA: Diagnosis not present

## 2014-08-11 DIAGNOSIS — I708 Atherosclerosis of other arteries: Secondary | ICD-10-CM | POA: Diagnosis not present

## 2014-08-11 DIAGNOSIS — J302 Other seasonal allergic rhinitis: Secondary | ICD-10-CM | POA: Diagnosis not present

## 2014-08-20 ENCOUNTER — Ambulatory Visit (HOSPITAL_COMMUNITY)
Admission: RE | Admit: 2014-08-20 | Discharge: 2014-08-20 | Disposition: A | Discharge status: Home / Self Care | Payer: Medicare Other | Source: Ambulatory Visit | Attending: Urology | Admitting: Urology

## 2014-08-20 ENCOUNTER — Encounter (HOSPITAL_COMMUNITY)
Admission: RE | Admit: 2014-08-20 | Discharge: 2014-08-20 | Disposition: A | Discharge status: Home / Self Care | Payer: Medicare Other | Source: Ambulatory Visit | Attending: Urology | Admitting: Urology

## 2014-08-20 DIAGNOSIS — C61 Malignant neoplasm of prostate: Secondary | ICD-10-CM | POA: Diagnosis not present

## 2014-08-20 DIAGNOSIS — M25512 Pain in left shoulder: Secondary | ICD-10-CM | POA: Insufficient documentation

## 2014-08-20 DIAGNOSIS — M25511 Pain in right shoulder: Secondary | ICD-10-CM | POA: Insufficient documentation

## 2014-08-20 DIAGNOSIS — Z85528 Personal history of other malignant neoplasm of kidney: Secondary | ICD-10-CM | POA: Diagnosis not present

## 2014-08-20 DIAGNOSIS — R937 Abnormal findings on diagnostic imaging of other parts of musculoskeletal system: Secondary | ICD-10-CM | POA: Diagnosis not present

## 2014-08-20 DIAGNOSIS — Z8546 Personal history of malignant neoplasm of prostate: Secondary | ICD-10-CM | POA: Diagnosis not present

## 2014-08-20 MED ORDER — TECHNETIUM TC 99M MEDRONATE IV KIT
27.0000 | PACK | Freq: Once | INTRAVENOUS | Status: AC | PRN
  Administered 2014-08-20: 27 via INTRAVENOUS

## 2014-08-24 ENCOUNTER — Ambulatory Visit (HOSPITAL_COMMUNITY)
Admission: RE | Admit: 2014-08-24 | Discharge: 2014-08-24 | Disposition: A | Discharge status: Home / Self Care | Payer: Medicare Other | Source: Ambulatory Visit | Attending: Urology | Admitting: Urology

## 2014-08-24 ENCOUNTER — Other Ambulatory Visit (HOSPITAL_COMMUNITY): Payer: Self-pay | Admitting: Urology

## 2014-08-24 DIAGNOSIS — M5134 Other intervertebral disc degeneration, thoracic region: Secondary | ICD-10-CM | POA: Diagnosis not present

## 2014-08-24 DIAGNOSIS — M47894 Other spondylosis, thoracic region: Secondary | ICD-10-CM | POA: Insufficient documentation

## 2014-08-24 DIAGNOSIS — R948 Abnormal results of function studies of other organs and systems: Secondary | ICD-10-CM | POA: Diagnosis present

## 2014-08-24 DIAGNOSIS — M5136 Other intervertebral disc degeneration, lumbar region: Secondary | ICD-10-CM | POA: Diagnosis not present

## 2014-08-24 DIAGNOSIS — C61 Malignant neoplasm of prostate: Secondary | ICD-10-CM | POA: Insufficient documentation

## 2014-08-24 DIAGNOSIS — I700 Atherosclerosis of aorta: Secondary | ICD-10-CM | POA: Diagnosis not present

## 2014-08-26 DIAGNOSIS — D495 Neoplasm of unspecified behavior of other genitourinary organs: Secondary | ICD-10-CM | POA: Diagnosis not present

## 2014-08-26 DIAGNOSIS — C61 Malignant neoplasm of prostate: Secondary | ICD-10-CM | POA: Diagnosis not present

## 2014-09-06 ENCOUNTER — Observation Stay (HOSPITAL_COMMUNITY)
Admission: EM | Admit: 2014-09-06 | Discharge: 2014-09-07 | Disposition: A | Discharge status: Home / Self Care | Payer: Medicare Other | Attending: Internal Medicine | Admitting: Internal Medicine

## 2014-09-06 ENCOUNTER — Emergency Department (HOSPITAL_COMMUNITY): Payer: Medicare Other

## 2014-09-06 ENCOUNTER — Encounter (HOSPITAL_COMMUNITY): Payer: Self-pay | Admitting: Emergency Medicine

## 2014-09-06 DIAGNOSIS — Z8701 Personal history of pneumonia (recurrent): Secondary | ICD-10-CM | POA: Insufficient documentation

## 2014-09-06 DIAGNOSIS — Z9889 Other specified postprocedural states: Secondary | ICD-10-CM | POA: Diagnosis not present

## 2014-09-06 DIAGNOSIS — F329 Major depressive disorder, single episode, unspecified: Secondary | ICD-10-CM | POA: Insufficient documentation

## 2014-09-06 DIAGNOSIS — Z8546 Personal history of malignant neoplasm of prostate: Secondary | ICD-10-CM | POA: Diagnosis not present

## 2014-09-06 DIAGNOSIS — Z7951 Long term (current) use of inhaled steroids: Secondary | ICD-10-CM | POA: Insufficient documentation

## 2014-09-06 DIAGNOSIS — I2510 Atherosclerotic heart disease of native coronary artery without angina pectoris: Secondary | ICD-10-CM | POA: Diagnosis not present

## 2014-09-06 DIAGNOSIS — I209 Angina pectoris, unspecified: Secondary | ICD-10-CM | POA: Diagnosis not present

## 2014-09-06 DIAGNOSIS — R0789 Other chest pain: Secondary | ICD-10-CM | POA: Diagnosis not present

## 2014-09-06 DIAGNOSIS — E782 Mixed hyperlipidemia: Secondary | ICD-10-CM | POA: Diagnosis not present

## 2014-09-06 DIAGNOSIS — H9190 Unspecified hearing loss, unspecified ear: Secondary | ICD-10-CM | POA: Insufficient documentation

## 2014-09-06 DIAGNOSIS — K219 Gastro-esophageal reflux disease without esophagitis: Secondary | ICD-10-CM | POA: Diagnosis not present

## 2014-09-06 DIAGNOSIS — R079 Chest pain, unspecified: Principal | ICD-10-CM | POA: Diagnosis present

## 2014-09-06 DIAGNOSIS — D649 Anemia, unspecified: Secondary | ICD-10-CM | POA: Insufficient documentation

## 2014-09-06 DIAGNOSIS — Z8619 Personal history of other infectious and parasitic diseases: Secondary | ICD-10-CM | POA: Diagnosis not present

## 2014-09-06 DIAGNOSIS — Z85528 Personal history of other malignant neoplasm of kidney: Secondary | ICD-10-CM | POA: Insufficient documentation

## 2014-09-06 DIAGNOSIS — I252 Old myocardial infarction: Secondary | ICD-10-CM | POA: Insufficient documentation

## 2014-09-06 DIAGNOSIS — Z7952 Long term (current) use of systemic steroids: Secondary | ICD-10-CM | POA: Insufficient documentation

## 2014-09-06 DIAGNOSIS — Z872 Personal history of diseases of the skin and subcutaneous tissue: Secondary | ICD-10-CM | POA: Diagnosis not present

## 2014-09-06 DIAGNOSIS — J449 Chronic obstructive pulmonary disease, unspecified: Secondary | ICD-10-CM | POA: Insufficient documentation

## 2014-09-06 DIAGNOSIS — E039 Hypothyroidism, unspecified: Secondary | ICD-10-CM | POA: Insufficient documentation

## 2014-09-06 DIAGNOSIS — I6523 Occlusion and stenosis of bilateral carotid arteries: Secondary | ICD-10-CM | POA: Diagnosis present

## 2014-09-06 DIAGNOSIS — Z720 Tobacco use: Secondary | ICD-10-CM | POA: Diagnosis not present

## 2014-09-06 DIAGNOSIS — I10 Essential (primary) hypertension: Secondary | ICD-10-CM | POA: Insufficient documentation

## 2014-09-06 DIAGNOSIS — Z880 Allergy status to penicillin: Secondary | ICD-10-CM | POA: Diagnosis not present

## 2014-09-06 DIAGNOSIS — E0590 Thyrotoxicosis, unspecified without thyrotoxic crisis or storm: Secondary | ICD-10-CM | POA: Insufficient documentation

## 2014-09-06 DIAGNOSIS — Z79899 Other long term (current) drug therapy: Secondary | ICD-10-CM | POA: Insufficient documentation

## 2014-09-06 DIAGNOSIS — J432 Centrilobular emphysema: Secondary | ICD-10-CM

## 2014-09-06 DIAGNOSIS — F17200 Nicotine dependence, unspecified, uncomplicated: Secondary | ICD-10-CM | POA: Diagnosis present

## 2014-09-06 DIAGNOSIS — Z7982 Long term (current) use of aspirin: Secondary | ICD-10-CM | POA: Diagnosis not present

## 2014-09-06 DIAGNOSIS — J439 Emphysema, unspecified: Secondary | ICD-10-CM | POA: Diagnosis present

## 2014-09-06 LAB — COMPREHENSIVE METABOLIC PANEL
ALBUMIN: 3.8 g/dL (ref 3.5–5.0)
ALT: 15 U/L — ABNORMAL LOW (ref 17–63)
AST: 24 U/L (ref 15–41)
Alkaline Phosphatase: 70 U/L (ref 38–126)
Anion gap: 7 (ref 5–15)
BILIRUBIN TOTAL: 0.5 mg/dL (ref 0.3–1.2)
BUN: 13 mg/dL (ref 6–20)
CHLORIDE: 101 mmol/L (ref 101–111)
CO2: 27 mmol/L (ref 22–32)
CREATININE: 1.17 mg/dL (ref 0.61–1.24)
Calcium: 9.3 mg/dL (ref 8.9–10.3)
GFR calc Af Amer: >60 mL/min (ref >60)
GFR calc non Af Amer: 57 mL/min — ABNORMAL LOW (ref >60)
Glucose, Bld: 100 mg/dL — ABNORMAL HIGH (ref 70–99)
Potassium: 3.9 mmol/L (ref 3.5–5.1)
Sodium: 135 mmol/L (ref 135–145)
TOTAL PROTEIN: 7.1 g/dL (ref 6.5–8.1)

## 2014-09-06 LAB — TROPONIN I
Troponin I: <0.03 ng/mL (ref <0.031)
Troponin I: <0.03 ng/mL (ref <0.031)

## 2014-09-06 LAB — CBC WITH DIFFERENTIAL/PLATELET
BASOS PCT: 1 % (ref 0–1)
Basophils Absolute: 0.1 10*3/uL (ref 0.0–0.1)
EOS ABS: 0.3 10*3/uL (ref 0.0–0.7)
EOS PCT: 3 % (ref 0–5)
HEMATOCRIT: 40.3 % (ref 39.0–52.0)
Hemoglobin: 13.6 g/dL (ref 13.0–17.0)
LYMPHS ABS: 2.2 10*3/uL (ref 0.7–4.0)
LYMPHS PCT: 22 % (ref 12–46)
MCH: 32.4 pg (ref 26.0–34.0)
MCHC: 33.7 g/dL (ref 30.0–36.0)
MCV: 96 fL (ref 78.0–100.0)
MONOS PCT: 7 % (ref 3–12)
Monocytes Absolute: 0.7 10*3/uL (ref 0.1–1.0)
NEUTROS ABS: 6.6 10*3/uL (ref 1.7–7.7)
Neutrophils Relative %: 67 % (ref 43–77)
Platelets: 263 10*3/uL (ref 150–400)
RBC: 4.2 MIL/uL — ABNORMAL LOW (ref 4.22–5.81)
RDW: 14 % (ref 11.5–15.5)
WBC: 9.8 10*3/uL (ref 4.0–10.5)

## 2014-09-06 MED ORDER — LEVOTHYROXINE SODIUM 25 MCG PO TABS: 125.0000 ug | ORAL_TABLET | Freq: Every morning | ORAL | Status: DC

## 2014-09-06 MED ORDER — CITALOPRAM HYDROBROMIDE 20 MG PO TABS
20.0000 mg | ORAL_TABLET | Freq: Every morning | ORAL | Status: DC
  Administered 2014-09-07: 20 mg via ORAL
  Filled 2014-09-06 (×3): qty 1

## 2014-09-06 MED ORDER — TAMSULOSIN HCL 0.4 MG PO CAPS
0.4000 mg | ORAL_CAPSULE | Freq: Every day | ORAL | Status: DC
  Administered 2014-09-06: 0.4 mg via ORAL
  Filled 2014-09-06: qty 1

## 2014-09-06 MED ORDER — MOMETASONE FURO-FORMOTEROL FUM 100-5 MCG/ACT IN AERO
INHALATION_SPRAY | RESPIRATORY_TRACT | Status: AC
Start: 2014-09-06 — End: 2014-09-06
  Filled 2014-09-06: qty 8.8

## 2014-09-06 MED ORDER — LORATADINE 10 MG PO TABS
10.0000 mg | ORAL_TABLET | Freq: Every day | ORAL | Status: DC
  Administered 2014-09-07: 10 mg via ORAL
  Filled 2014-09-06: qty 1

## 2014-09-06 MED ORDER — NITROGLYCERIN 0.4 MG SL SUBL: 0.4000 mg | SUBLINGUAL_TABLET | SUBLINGUAL | Status: DC | PRN

## 2014-09-06 MED ORDER — TIOTROPIUM BROMIDE MONOHYDRATE 18 MCG IN CAPS
ORAL_CAPSULE | RESPIRATORY_TRACT | Status: AC
  Filled 2014-09-06: qty 5

## 2014-09-06 MED ORDER — ACETAMINOPHEN 500 MG PO TABS: 1000.0000 mg | ORAL_TABLET | Freq: Three times a day (TID) | ORAL | Status: DC | PRN

## 2014-09-06 MED ORDER — HEPARIN SODIUM (PORCINE) 5000 UNIT/ML IJ SOLN
5000.0000 [IU] | Freq: Three times a day (TID) | INTRAMUSCULAR | Status: DC
  Administered 2014-09-06 – 2014-09-07 (×3): 5000 [IU] via SUBCUTANEOUS
  Filled 2014-09-06 (×3): qty 1

## 2014-09-06 MED ORDER — AMLODIPINE BESYLATE 5 MG PO TABS
10.0000 mg | ORAL_TABLET | Freq: Every morning | ORAL | Status: DC
  Administered 2014-09-07: 10 mg via ORAL
  Filled 2014-09-06: qty 2

## 2014-09-06 MED ORDER — NITROGLYCERIN 0.4 MG SL SUBL
0.4000 mg | SUBLINGUAL_TABLET | SUBLINGUAL | Status: DC | PRN
  Administered 2014-09-06 (×2): 0.4 mg via SUBLINGUAL
  Filled 2014-09-06: qty 1

## 2014-09-06 MED ORDER — OMEGA 3 1200 MG PO CAPS: 2.0000 | ORAL_CAPSULE | Freq: Two times a day (BID) | ORAL | Status: DC

## 2014-09-06 MED ORDER — ONE-DAILY MULTI VITAMINS PO TABS: 1.0000 | ORAL_TABLET | Freq: Every morning | ORAL | Status: DC

## 2014-09-06 MED ORDER — ASPIRIN 81 MG PO TABS: 81.0000 mg | ORAL_TABLET | Freq: Every morning | ORAL | Status: DC

## 2014-09-06 MED ORDER — TIOTROPIUM BROMIDE MONOHYDRATE 18 MCG IN CAPS
18.0000 ug | ORAL_CAPSULE | Freq: Every day | RESPIRATORY_TRACT | Status: DC
  Administered 2014-09-06 – 2014-09-07 (×2): 18 ug via RESPIRATORY_TRACT
  Filled 2014-09-06: qty 5

## 2014-09-06 MED ORDER — FAMOTIDINE 20 MG PO TABS
20.0000 mg | ORAL_TABLET | Freq: Every day | ORAL | Status: DC
  Administered 2014-09-06: 20 mg via ORAL
  Filled 2014-09-06: qty 1

## 2014-09-06 MED ORDER — OMEGA-3-ACID ETHYL ESTERS 1 G PO CAPS
1.0000 g | ORAL_CAPSULE | Freq: Two times a day (BID) | ORAL | Status: DC
  Administered 2014-09-06 – 2014-09-07 (×2): 1 g via ORAL
  Filled 2014-09-06 (×2): qty 1

## 2014-09-06 MED ORDER — ALBUTEROL SULFATE HFA 108 (90 BASE) MCG/ACT IN AERS
2.0000 | INHALATION_SPRAY | Freq: Four times a day (QID) | RESPIRATORY_TRACT | Status: DC | PRN
  Filled 2014-09-06: qty 6.7

## 2014-09-06 MED ORDER — CLOBETASOL PROPIONATE 0.05 % EX CREA
1.0000 "application " | TOPICAL_CREAM | Freq: Every day | CUTANEOUS | Status: DC
  Filled 2014-09-06: qty 15

## 2014-09-06 MED ORDER — LEVOTHYROXINE SODIUM 25 MCG PO TABS
125.0000 ug | ORAL_TABLET | Freq: Every day | ORAL | Status: DC
  Administered 2014-09-07: 125 ug via ORAL
  Filled 2014-09-06 (×2): qty 1

## 2014-09-06 MED ORDER — ALBUTEROL SULFATE (2.5 MG/3ML) 0.083% IN NEBU: 2.5000 mg | INHALATION_SOLUTION | Freq: Four times a day (QID) | RESPIRATORY_TRACT | Status: DC | PRN

## 2014-09-06 MED ORDER — ACETAMINOPHEN 325 MG PO TABS: 650.0000 mg | ORAL_TABLET | ORAL | Status: DC | PRN

## 2014-09-06 MED ORDER — FENTANYL CITRATE (PF) 100 MCG/2ML IJ SOLN
50.0000 ug | INTRAMUSCULAR | Status: DC | PRN
  Administered 2014-09-06: 50 ug via INTRAVENOUS
  Filled 2014-09-06: qty 2

## 2014-09-06 MED ORDER — LORATADINE 10 MG PO TABS
10.0000 mg | ORAL_TABLET | Freq: Every day | ORAL | Status: DC
  Administered 2014-09-06: 10 mg via ORAL
  Filled 2014-09-06: qty 1

## 2014-09-06 MED ORDER — FERROUS SULFATE 325 (65 FE) MG PO TABS
325.0000 mg | ORAL_TABLET | Freq: Every day | ORAL | Status: DC
  Administered 2014-09-06: 325 mg via ORAL
  Filled 2014-09-06 (×3): qty 1

## 2014-09-06 MED ORDER — ISOSORBIDE MONONITRATE ER 60 MG PO TB24
60.0000 mg | ORAL_TABLET | Freq: Two times a day (BID) | ORAL | Status: DC
  Administered 2014-09-06 – 2014-09-07 (×2): 60 mg via ORAL
  Filled 2014-09-06 (×6): qty 1

## 2014-09-06 MED ORDER — FLUTICASONE PROPIONATE 50 MCG/ACT NA SUSP
1.0000 | Freq: Every day | NASAL | Status: DC
  Administered 2014-09-06 – 2014-09-07 (×2): 1 via NASAL
  Filled 2014-09-06 (×3): qty 16

## 2014-09-06 MED ORDER — ONDANSETRON HCL 4 MG/2ML IJ SOLN: 4.0000 mg | Freq: Four times a day (QID) | INTRAMUSCULAR | Status: DC | PRN

## 2014-09-06 MED ORDER — TRAZODONE HCL 50 MG PO TABS: 100.0000 mg | ORAL_TABLET | Freq: Every evening | ORAL | Status: DC | PRN

## 2014-09-06 MED ORDER — FLUTICASONE PROPIONATE 50 MCG/ACT NA SUSP
NASAL | Status: AC
  Filled 2014-09-06: qty 16

## 2014-09-06 MED ORDER — ASPIRIN 81 MG PO CHEW
243.0000 mg | CHEWABLE_TABLET | Freq: Once | ORAL | Status: AC
  Administered 2014-09-06: 243 mg via ORAL
  Filled 2014-09-06: qty 3

## 2014-09-06 MED ORDER — ASPIRIN 81 MG PO CHEW
81.0000 mg | CHEWABLE_TABLET | Freq: Every day | ORAL | Status: DC
  Administered 2014-09-07: 81 mg via ORAL
  Filled 2014-09-06: qty 1

## 2014-09-06 MED ORDER — ATORVASTATIN CALCIUM 40 MG PO TABS
40.0000 mg | ORAL_TABLET | Freq: Every day | ORAL | Status: DC
  Administered 2014-09-06: 40 mg via ORAL
  Filled 2014-09-06 (×3): qty 1

## 2014-09-06 MED ORDER — FAMOTIDINE 20 MG PO TABS
20.0000 mg | ORAL_TABLET | Freq: Every day | ORAL | Status: DC
  Administered 2014-09-07: 20 mg via ORAL
  Filled 2014-09-06: qty 1

## 2014-09-06 MED ORDER — SALONPAS ARTHRITIS PAIN RELIEF EX PADS: 1.0000 | MEDICATED_PAD | CUTANEOUS | Status: DC | PRN

## 2014-09-06 MED ORDER — THEOPHYLLINE ER 200 MG PO TB12
100.0000 mg | ORAL_TABLET | Freq: Two times a day (BID) | ORAL | Status: DC
  Administered 2014-09-06 – 2014-09-07 (×2): 100 mg via ORAL
  Filled 2014-09-06 (×2): qty 1

## 2014-09-06 MED ORDER — MOMETASONE FURO-FORMOTEROL FUM 100-5 MCG/ACT IN AERO
2.0000 | INHALATION_SPRAY | Freq: Two times a day (BID) | RESPIRATORY_TRACT | Status: DC
  Administered 2014-09-06 – 2014-09-07 (×2): 2 via RESPIRATORY_TRACT
  Filled 2014-09-06: qty 8.8

## 2014-09-06 NOTE — ED Provider Notes (Signed)
CSN: [id number]     Arrival date & time 09/06/14  1417 History   First MD Initiated Contact with Patient 09/06/14 1433     Chief Complaint  Patient presents with  . Chest Pain     (Consider location/radiation/quality/duration/timing/severity/associated sxs/prior Treatment) HPI Comments: 81 year old male with history of tobacco abuse, CAD, COPD, high blood pressure presents with left-sided chest pain that radiates the arms bilaterally that started this morning. Improved with nitroglycerin however patient has mild pain currently. Patient has history of heart attack. Patient has history of 3 cardiac stents and is on aspirin. Patient had a baby aspirin today. Patient feels improved however still has mild discomfort pressure/tightness. No shortness of breath, no recent surgeries, no blood clot history, he no unilateral leg symptoms no active cancer.  Patient is a 81 y.o. male presenting with chest pain. The history is provided by the patient.  Chest Pain Associated symptoms: no abdominal pain, no back pain, no fever, no headache, no shortness of breath and not vomiting     Past Medical History  Diagnosis Date  . Hyperthyroidism 1978    Status post radioactive iodine treatment  . History of herpes zoster 2001  . Vitreous hemorrhage MAY 2009     Right eye  . Peripheral arterial disease   . Mixed hyperlipidemia   . Essential hypertension, benign   . Allergic rhinitis   . COPD (chronic obstructive pulmonary disease)   . History of asbestos exposure   . Depression   . Anemia   . Carotid artery occlusion   . History of myocardial infarction   . Coronary atherosclerosis of native coronary artery     BMS RCA 2003, DES RCA 2004, DES RCA 2005   . GERD (gastroesophageal reflux disease)   . Hemorrhoids   . Head injury, closed, with brief LOC   . HOH (hard of hearing)   . Hypothyroidism   . Lumbar pain   . Psoriasis   . Prostate cancer     Status post XRT/IMRT 2008 - Dr. Alinda Money  . Renal  cancer     Cryoablation in 2012  . Pneumonia     November 2014   Past Surgical History  Procedure Laterality Date  . Popliteal synovial cyst excision      knee  . Cervical disc surgery  APRIL 2004    fusion  . Partial knee arthroplasty  JULY 2005    Right-replacement  . Nasal septum surgery      SMR  . Neck disk fusion  07/2002  . Knee arthroplasty      bilateral  . Endarterectomy  08/30/2011    Procedure: Left ENDARTERECTOMY CAROTID;  Surgeon: Conrad Austin, MD;  Location: [facility];  Service: Vascular;  Laterality: Left;  . Joint replacement Right 2005    Right partial Knee  . Joint replacement Left 2011    Left toatal Knee  . Endarterectomy Right 12/18/2012    Procedure: ENDARTERECTOMY CAROTID WITH BOVINE PATCH ANGIOPLASTY;  Surgeon: Conrad [place], MD;  Location: [facility];  Service: Vascular;  Laterality: Right;  . Carotid endarterectomy    . Cholecystectomy  1995  . Cardiac catheterization      AUG 2003, AUG 2004, OCT 2005 9 HAS 3 STENTS DONE AT 3 DIFFERENT CATHS - ONE IS DRUG ELUTING STENT; CATH 2006, 2007, 2009 2010  . Eye surgery  2010    bilat cataract.2009-Vitreous Hemorrhage-2009  . Total knee revision Right 08/06/2013    Procedure: REVISION RIGHT TOTAL KNEE  ARTHROPLASTY  ;  Surgeon: Gearlean Alf, MD;  Location: [facility];  Service: Orthopedics;  Laterality: Right;   Family History  Problem Relation Age of Onset  . Coronary artery disease Brother   . Cancer Brother   . Heart disease Brother   . Heart attack Brother   . Anesthesia problems Neg Hx    History  Substance Use Topics  . Smoking status: Current Every Day Smoker -- 0.25 packs/day for 60 years    Types: Cigarettes    Start date: 10/20/1943  . Smokeless tobacco: Never Used  . Alcohol Use: No     Comment: TRYING TO QUIT SMOKING    Review of Systems  Constitutional: Negative for fever and chills.  HENT: Negative for congestion.   Eyes: Negative for visual disturbance.  Respiratory: Negative for shortness of  breath.   Cardiovascular: Positive for chest pain.  Gastrointestinal: Negative for vomiting and abdominal pain.  Genitourinary: Negative for dysuria and flank pain.  Musculoskeletal: Negative for back pain, neck pain and neck stiffness.  Skin: Negative for rash.  Neurological: Positive for light-headedness. Negative for headaches.      Allergies  Ciprofloxacin; Iron; Lyrica; Morphine and related; Oxycodone; Penicillins; and Hydrocodone-acetaminophen  Home Medications   Prior to Admission medications   Medication Sig Start Date End Date Taking? Authorizing Provider  acetaminophen (TYLENOL) 650 MG CR tablet Take 1,300 mg by mouth every 8 (eight) hours as needed for pain.   Yes Historical Provider, MD  albuterol (PROAIR HFA) 108 (90 BASE) MCG/ACT inhaler Inhale 2 puffs into the lungs every 6 (six) hours as needed. For shortness of breath   Yes Historical Provider, MD  amLODipine (NORVASC) 10 MG tablet Take 10 mg by mouth every morning.    Yes Historical Provider, MD  aspirin 81 MG tablet Take 81 mg by mouth every morning.    Yes Historical Provider, MD  atorvastatin (LIPITOR) 40 MG tablet Take 40 mg by mouth at bedtime.    Yes Historical Provider, MD  cetirizine (ZYRTEC) 10 MG tablet Take 10 mg by mouth every morning.    Yes Historical Provider, MD  citalopram (CELEXA) 20 MG tablet Take 20 mg by mouth every morning.    Yes Historical Provider, MD  clobetasol cream (TEMOVATE) 0.10 % Apply 1 application topically daily. Applied to legs for psoriasis   Yes Historical Provider, MD  ferrous sulfate 325 (65 FE) MG tablet Take 325 mg by mouth at bedtime.    Yes Historical Provider, MD  Fluticasone-Salmeterol (ADVAIR DISKUS) 250-50 MCG/DOSE AEPB Inhale 1 puff into the lungs every 12 (twelve) hours.     Yes Historical Provider, MD  isosorbide mononitrate (IMDUR) 60 MG 24 hr tablet Take 1 tablet (60 mg total) by mouth 2 (two) times daily. 08/29/13  Yes Satira Sark, MD  levothyroxine (SYNTHROID,  LEVOTHROID) 125 MCG tablet Take 125 mcg by mouth every morning.    Yes Historical Provider, MD  [facility] ARTHRITIS PAIN RELIEF) PADS Apply 1 each topically as needed (Shoulder Pain).   Yes Historical Provider, MD  mometasone (NASONEX) 50 MCG/ACT nasal spray Place 2 sprays into the nose at bedtime. 11/24/10  Yes Deneise Lever, MD  Multiple Vitamin (MULTIVITAMIN) tablet Take 1 tablet by mouth every morning.    Yes Historical Provider, MD  nitroGLYCERIN (NITROSTAT) 0.4 MG SL tablet Place 1 tablet (0.4 mg total) under the tongue every 5 (five) minutes as needed. For chest pain 12/23/13  Yes Satira Sark, MD  Omega  3 1200 MG CAPS Take 2 capsules by mouth 2 (two) times daily. Take 2 capsules in AM and 2 capsules in  PM   Yes Historical Provider, MD  ranitidine (ZANTAC) 150 MG tablet Take 150 mg by mouth every morning.    Yes Historical Provider, MD  Tamsulosin HCl (FLOMAX) 0.4 MG CAPS Take 0.4 mg by mouth at bedtime.    Yes Historical Provider, MD  theophylline (THEODUR) 100 MG 12 hr tablet Take 1 tablet (100 mg total) by mouth 2 (two) times daily. 10/07/13  Yes Deneise Lever, MD  tiotropium (SPIRIVA) 18 MCG inhalation capsule Place 18 mcg into inhaler and inhale daily. 08/01/12  Yes Deneise Lever, MD  traZODone (DESYREL) 100 MG tablet Take 100 mg by mouth at bedtime as needed. For sleep   Yes Historical Provider, MD  polyethylene glycol (MIRALAX / GLYCOLAX) packet Take 17 g by mouth daily as needed for mild constipation. Patient not taking: Reported on 09/06/2014 08/06/13   Amber Constable, PA-C   BP 147/67 mmHg  Pulse 65  Temp(Src) 97.9 F (36.6 C) (Oral)  Resp 16  Ht '5\' 7"'$  (1.702 m)  Wt 155 lb 4 oz (70.421 kg)  BMI 24.31 kg/m2  SpO2 96% Physical Exam  Constitutional: He is oriented to person, place, and time. He appears well-developed and well-nourished.  HENT:  Head: Normocephalic and atraumatic.  Eyes: Conjunctivae are normal. Right eye exhibits no discharge. Left eye exhibits  no discharge.  Neck: Normal range of motion. Neck supple. No tracheal deviation present.  Cardiovascular: Normal rate and regular rhythm.   Pulmonary/Chest: Effort normal and breath sounds normal.  Abdominal: Soft. He exhibits no distension. There is no tenderness. There is no guarding.  Musculoskeletal: He exhibits no edema.  Neurological: He is alert and oriented to person, place, and time.  Skin: Skin is warm. No rash noted.  Psychiatric: He has a normal mood and affect.  Nursing note and vitals reviewed.   ED Course  Procedures (including critical care time) Labs Review Labs Reviewed  CBC WITH DIFFERENTIAL/PLATELET - Abnormal; Notable for the following:    RBC 4.20 (*)    All other components within normal limits  COMPREHENSIVE METABOLIC PANEL - Abnormal; Notable for the following:    Glucose, Bld 100 (*)    ALT 15 (*)    GFR calc non Af Amer 57 (*)    All other components within normal limits  TROPONIN I  TROPONIN I  TROPONIN I  TROPONIN I    Imaging Review Dg Chest 2 View  09/06/2014   CLINICAL DATA:  Left-sided chest pain  EXAM: CHEST  2 VIEW  COMPARISON:  07/03/2014  FINDINGS: Cardiac shadow is stable. The lungs are well aerated bilaterally. Mild thickening of the minor fissure is noted but stable. No focal infiltrate or sizable effusion is seen. Some scarring is noted in the bases bilaterally. No bony abnormality is seen. Hyperinflation is again noted.  IMPRESSION: COPD without acute abnormality.   Electronically Signed   By: Inez Catalina M.D.   On: 09/06/2014 15:10     EKG Interpretation   Date/Time:  Sunday Sep 06 2014 14:22:31 EDT Ventricular Rate:  71 PR Interval:  220 QRS Duration: 98 QT Interval:  384 QTC Calculation: 417 R Axis:   -35 Text Interpretation:  Sinus rhythm with 1st degree A-V block Left axis  deviation Abnormal ECG Confirmed by Jeneen Rinks  MD, [place] ([id number]) on 09/06/2014  2:52:34 PM      MDM  Final diagnoses:  Acute chest pain   Patient  with chest discomfort and cardiac history. Chest pain improved in ER. Troponin negative, EKG no acute ST elevation. Discussed with [facility] for telemetry admission.  The patients results and plan were reviewed and discussed.   Any x-rays performed were personally reviewed by myself.   Differential diagnosis were considered with the presenting HPI.  Medications  nitroGLYCERIN (NITROSTAT) SL tablet 0.4 mg (0.4 mg Sublingual Given 09/06/14 1631)  mometasone-formoterol (DULERA) 100-5 MCG/ACT inhaler 2 puff (2 puffs Inhalation Given 09/06/14 2035)  citalopram (CELEXA) tablet 20 mg (not administered)  ferrous sulfate tablet 325 mg (not administered)  tamsulosin (FLOMAX) capsule 0.4 mg (not administered)  atorvastatin (LIPITOR) tablet 40 mg (not administered)  traZODone (DESYREL) tablet 100 mg (not administered)  amLODipine (NORVASC) tablet 10 mg (not administered)  clobetasol cream (TEMOVATE) 0.03 % 1 application (1 application Topical Not Given 09/06/14 1630)  fluticasone (FLONASE) 50 MCG/ACT nasal spray 1 spray (1 spray Each Nare Given 09/06/14 1755)  tiotropium (SPIRIVA) inhalation capsule 18 mcg (18 mcg Inhalation Given 09/06/14 2039)  isosorbide mononitrate (IMDUR) 24 hr tablet 60 mg (not administered)  theophylline (THEODUR) 12 hr tablet 100 mg (not administered)  nitroGLYCERIN (NITROSTAT) SL tablet 0.4 mg (not administered)  acetaminophen (TYLENOL) tablet 650 mg (not administered)  ondansetron (ZOFRAN) injection 4 mg (not administered)  heparin injection 5,000 Units (5,000 Units Subcutaneous Given 09/06/14 1755)  albuterol (PROVENTIL) (2.5 MG/3ML) 0.083% nebulizer solution 2.5 mg (not administered)  levothyroxine (SYNTHROID, LEVOTHROID) tablet 125 mcg (not administered)  famotidine (PEPCID) tablet 20 mg (not administered)  loratadine (CLARITIN) tablet 10 mg (not administered)  aspirin chewable tablet 81 mg (not administered)  omega-3 acid ethyl esters (LOVAZA) capsule 1 g (not  administered)  aspirin chewable tablet 243 mg (243 mg Oral Given 09/06/14 1536)    Filed Vitals:   09/06/14 1633 09/06/14 1700 09/06/14 1718 09/06/14 2043  BP:  121/63 147/67   Pulse: 63 60 65 65  Temp:   97.9 F (36.6 C)   TempSrc:   Oral   Resp: 19   16  Height:   '5\' 7"'$  (1.702 m)   Weight:   155 lb 4 oz (70.421 kg)   SpO2: 96% 92% 95% 96%    Final diagnoses:  Acute chest pain    Admission/ observation were discussed with the admitting physician, patient and/or family and they are comfortable with the plan.     Elnora Morrison, MD 09/06/14 2121

## 2014-09-06 NOTE — Progress Notes (Signed)
Patients spiriva started he had not taken this morning.

## 2014-09-06 NOTE — ED Notes (Signed)
Patient c/o left side chest pain that radiates into arms bilaterally that started this morning at 9am. Denies any shortness of breath but reports some dizziness. Per patient took 3 SL nitro at 10:30am that eased the pain. Per patient started increasing again at 12. Per patient hx of MI and 3 cardiac stents placed. Per patient pain feels similar to pain with MI.

## 2014-09-06 NOTE — H&P (Signed)
[facility] History and Physical  JOHNCARLO MAALOUF WHQ:[id number] DOB: Mar 13, 1932 DOA: 09/06/2014  Referring physician: ER PCP: Curlene Labrum, MD   Chief Complaint: Chest pain  HPI: Michael [name] is a 81 y.o. male  This is an 81 year old man who has a history of coronary artery disease, having had stenting procedure approximately 10 years ago, who now presents with left-sided chest pain which started approximately 10 AM this morning at rest after he had taken a shower. It lasted approximately 30 minutes until he took some sublingual nitroglycerin, which helped  the pain. He says that this pain feels similar to the pain he had with his myocardial infarction. The pain is tightening in nature and radiates to the left arm. It is not associated with dyspnea, nausea or sweating. He continues to smoke cigarettes. Initial evaluation does not show any acute ECG changes and initial troponin level is negative. He is now being admitted for further investigation. He denies any cough, fever, palpitations, dyspnea.   Review of Systems:  Apart from symptoms above, all systems negative.  Past Medical History  Diagnosis Date  . Hyperthyroidism 1978    Status post radioactive iodine treatment  . History of herpes zoster 2001  . Vitreous hemorrhage MAY 2009     Right eye  . Peripheral arterial disease   . Mixed hyperlipidemia   . Essential hypertension, benign   . Allergic rhinitis   . COPD (chronic obstructive pulmonary disease)   . History of asbestos exposure   . Depression   . Anemia   . Carotid artery occlusion   . History of myocardial infarction   . Coronary atherosclerosis of native coronary artery     BMS RCA 2003, DES RCA 2004, DES RCA 2005   . GERD (gastroesophageal reflux disease)   . Hemorrhoids   . Head injury, closed, with brief LOC   . HOH (hard of hearing)   . Hypothyroidism   . Lumbar pain   . Psoriasis   . Prostate cancer     Status post XRT/IMRT 2008 - Dr. Alinda Money    . Renal cancer     Cryoablation in 2012  . Pneumonia     November 2014   Past Surgical History  Procedure Laterality Date  . Popliteal synovial cyst excision      knee  . Cervical disc surgery  APRIL 2004    fusion  . Partial knee arthroplasty  JULY 2005    Right-replacement  . Nasal septum surgery      SMR  . Neck disk fusion  07/2002  . Knee arthroplasty      bilateral  . Endarterectomy  08/30/2011    Procedure: Left ENDARTERECTOMY CAROTID;  Surgeon: Conrad Half Moon, MD;  Location: [place];  Service: Vascular;  Laterality: Left;  . Joint replacement Right 2005    Right partial Knee  . Joint replacement Left 2011    Left toatal Knee  . Endarterectomy Right 12/18/2012    Procedure: ENDARTERECTOMY CAROTID WITH BOVINE PATCH ANGIOPLASTY;  Surgeon: Conrad Pearland, MD;  Location: [place];  Service: Vascular;  Laterality: Right;  . Carotid endarterectomy    . Cholecystectomy  1995  . Cardiac catheterization      AUG 2003, AUG 2004, OCT 2005 9 HAS 3 STENTS DONE AT 3 DIFFERENT CATHS - ONE IS DRUG ELUTING STENT; CATH 2006, 2007, 2009 2010  . Eye surgery  2010    bilat cataract.2009-Vitreous Hemorrhage-2009  . Total knee revision Right 08/06/2013  Procedure: REVISION RIGHT TOTAL KNEE ARTHROPLASTY  ;  Surgeon: Gearlean Alf, MD;  Location: [facility];  Service: Orthopedics;  Laterality: Right;   Social History:  reports that he has been smoking Cigarettes.  He started smoking about 70 years ago. He has a 15 pack-year smoking history. He has never used smokeless tobacco. He reports that he does not drink alcohol or use illicit drugs.  Allergies  Allergen Reactions  . Ciprofloxacin Nausea Only  . Iron Itching  . Lyrica [Pregabalin] Other (See Comments)    Unknown reaction  . Morphine And Related Other (See Comments)    Unknown reaction  . Oxycodone Hives  . Penicillins Hives  . Hydrocodone-Acetaminophen Rash    Confusion    Family History  Problem Relation Age of Onset  . Coronary artery  disease Brother   . Cancer Brother   . Heart disease Brother   . Heart attack Brother   . Anesthesia problems Neg Hx      Prior to Admission medications   Medication Sig Start Date End Date Taking? Authorizing Provider  acetaminophen (TYLENOL) 650 MG CR tablet Take 1,300 mg by mouth every 8 (eight) hours as needed for pain.   Yes Historical Provider, MD  albuterol (PROAIR HFA) 108 (90 BASE) MCG/ACT inhaler Inhale 2 puffs into the lungs every 6 (six) hours as needed. For shortness of breath   Yes Historical Provider, MD  amLODipine (NORVASC) 10 MG tablet Take 10 mg by mouth every morning.    Yes Historical Provider, MD  aspirin 81 MG tablet Take 81 mg by mouth every morning.    Yes Historical Provider, MD  atorvastatin (LIPITOR) 40 MG tablet Take 40 mg by mouth at bedtime.    Yes Historical Provider, MD  cetirizine (ZYRTEC) 10 MG tablet Take 10 mg by mouth every morning.    Yes Historical Provider, MD  citalopram (CELEXA) 20 MG tablet Take 20 mg by mouth every morning.    Yes Historical Provider, MD  clobetasol cream (TEMOVATE) 7.41 % Apply 1 application topically daily. Applied to legs for psoriasis   Yes Historical Provider, MD  ferrous sulfate 325 (65 FE) MG tablet Take 325 mg by mouth at bedtime.    Yes Historical Provider, MD  Fluticasone-Salmeterol (ADVAIR DISKUS) 250-50 MCG/DOSE AEPB Inhale 1 puff into the lungs every 12 (twelve) hours.     Yes Historical Provider, MD  isosorbide mononitrate (IMDUR) 60 MG 24 hr tablet Take 1 tablet (60 mg total) by mouth 2 (two) times daily. 08/29/13  Yes Satira Sark, MD  levothyroxine (SYNTHROID, LEVOTHROID) 125 MCG tablet Take 125 mcg by mouth every morning.    Yes Historical Provider, MD  [facility] ARTHRITIS PAIN RELIEF) PADS Apply 1 each topically as needed (Shoulder Pain).   Yes Historical Provider, MD  mometasone (NASONEX) 50 MCG/ACT nasal spray Place 2 sprays into the nose at bedtime. 11/24/10  Yes Deneise Lever, MD  Multiple  Vitamin (MULTIVITAMIN) tablet Take 1 tablet by mouth every morning.    Yes Historical Provider, MD  nitroGLYCERIN (NITROSTAT) 0.4 MG SL tablet Place 1 tablet (0.4 mg total) under the tongue every 5 (five) minutes as needed. For chest pain 12/23/13  Yes Satira Sark, MD  Omega 3 1200 MG CAPS Take 2 capsules by mouth 2 (two) times daily. Take 2 capsules in AM and 2 capsules in  PM   Yes Historical Provider, MD  ranitidine (ZANTAC) 150 MG tablet Take 150 mg by mouth every morning.  Yes Historical Provider, MD  Tamsulosin HCl (FLOMAX) 0.4 MG CAPS Take 0.4 mg by mouth at bedtime.    Yes Historical Provider, MD  theophylline (THEODUR) 100 MG 12 hr tablet Take 1 tablet (100 mg total) by mouth 2 (two) times daily. 10/07/13  Yes Deneise Lever, MD  tiotropium (SPIRIVA) 18 MCG inhalation capsule Place 18 mcg into inhaler and inhale daily. 08/01/12  Yes Deneise Lever, MD  traZODone (DESYREL) 100 MG tablet Take 100 mg by mouth at bedtime as needed. For sleep   Yes Historical Provider, MD  polyethylene glycol (MIRALAX / GLYCOLAX) packet Take 17 g by mouth daily as needed for mild constipation. Patient not taking: Reported on 09/06/2014 08/06/13   Ardeen Jourdain, PA-C   Physical Exam: Filed Vitals:   09/06/14 1505 09/06/14 1515 09/06/14 1530 09/06/14 1600  BP:   126/63 115/56  Pulse:  62 62   Temp:      TempSrc:      Resp:  '21 16 19  '$ Height: '5\' 7"'$  (1.702 m)     Weight: 71.668 kg (158 lb)     SpO2:  94% 93%     Wt Readings from Last 3 Encounters:  09/06/14 71.668 kg (158 lb)  07/04/14 68.4 kg (150 lb 12.7 oz)  04/27/14 70.761 kg (156 lb)    General:  Appears calm and comfortable. He does not appear to be in pain at the present time. Eyes: PERRL, normal lids, irises & conjunctiva ENT: grossly normal hearing, lips & tongue Neck: no LAD, masses or thyromegaly Cardiovascular: RRR, no m/r/g. No LE edema. He appears to have bilateral carotid bruits. Telemetry: SR, no arrhythmias  Respiratory: CTA  bilaterally, no w/r/r. Normal respiratory effort. Abdomen: soft, ntnd Skin: no rash or induration seen on limited exam Musculoskeletal: grossly normal tone BUE/BLE Psychiatric: grossly normal mood and affect, speech fluent and appropriate Neurologic: grossly non-focal.          Labs on Admission:  Basic Metabolic Panel:  Recent Labs Lab 09/06/14 1436  NA 135  K 3.9  CL 101  CO2 27  GLUCOSE 100*  BUN 13  CREATININE 1.17  CALCIUM 9.3   Liver Function Tests:  Recent Labs Lab 09/06/14 1436  AST 24  ALT 15*  ALKPHOS 70  BILITOT 0.5  PROT 7.1  ALBUMIN 3.8   No results for input(s): LIPASE, AMYLASE in the last 168 hours. No results for input(s): AMMONIA in the last 168 hours. CBC:  Recent Labs Lab 09/06/14 1436  WBC 9.8  NEUTROABS 6.6  HGB 13.6  HCT 40.3  MCV 96.0  PLT 263   Cardiac Enzymes:  Recent Labs Lab 09/06/14 1436  TROPONINI <0.03    BNP (last 3 results) No results for input(s): BNP in the last 8760 hours.  ProBNP (last 3 results)  Recent Labs  02/15/14 1255  PROBNP 98.4    CBG: No results for input(s): GLUCAP in the last 168 hours.  Radiological Exams on Admission: Dg Chest 2 View  09/06/2014   CLINICAL DATA:  Left-sided chest pain  EXAM: CHEST  2 VIEW  COMPARISON:  07/03/2014  FINDINGS: Cardiac shadow is stable. The lungs are well aerated bilaterally. Mild thickening of the minor fissure is noted but stable. No focal infiltrate or sizable effusion is seen. Some scarring is noted in the bases bilaterally. No bony abnormality is seen. Hyperinflation is again noted.  IMPRESSION: COPD without acute abnormality.   Electronically Signed   By: Linus Mako.D.  On: 09/06/2014 15:10    EKG: Independently reviewed. Sinus rhythm with first-degree AV block. No acute ST-T wave changes.  Assessment/Plan   1. Chest pain. The description sounds cardiac in nature. He does have a history of coronary artery disease. He has hypertension and is a  current smoker. He also has hyperlipidemia. We will cycle troponins levels. We will request cardiology consultation in the morning. 2. Hypertension. Stable. Continue with home medications. 3. History of bilateral carotid artery stenosis. 4. Ongoing tobacco abuse. I counseled him regarding this.  Further recommendations will depend on patient's hospital progress.   Code Status: Full code.   DVT Prophylaxis: Heparin.  Family Communication: I discussed the plan with the patient at the bedside.   Disposition Plan: Home when medically stable.   Time spent: 45 minutes.  Doree Albee [facility] Pager [phone].

## 2014-09-07 DIAGNOSIS — E0590 Thyrotoxicosis, unspecified without thyrotoxic crisis or storm: Secondary | ICD-10-CM | POA: Diagnosis not present

## 2014-09-07 DIAGNOSIS — I10 Essential (primary) hypertension: Secondary | ICD-10-CM

## 2014-09-07 DIAGNOSIS — E782 Mixed hyperlipidemia: Secondary | ICD-10-CM

## 2014-09-07 DIAGNOSIS — R0789 Other chest pain: Secondary | ICD-10-CM

## 2014-09-07 DIAGNOSIS — R079 Chest pain, unspecified: Secondary | ICD-10-CM | POA: Diagnosis not present

## 2014-09-07 LAB — TROPONIN I

## 2014-09-07 MED ORDER — CLOBETASOL PROPIONATE 0.05 % EX OINT
TOPICAL_OINTMENT | Freq: Every day | CUTANEOUS | Status: DC
  Administered 2014-09-07: 1 via TOPICAL
  Filled 2014-09-07: qty 15

## 2014-09-07 NOTE — Progress Notes (Signed)
Pt's IV catheter removed and intact. Pt's IV site clean dry and intact. Discharge instructions and follow up appointments reviewed and discussed with patient. Medications reviewed and discussed with patient. All questions answered. No further questions at this time. Pt in no acute distress and stable condition at time of discharge. Pt escorted by nurse tech.

## 2014-09-07 NOTE — Discharge Instructions (Signed)
Chest Pain Observation  It is often hard to give a specific diagnosis for the cause of chest pain. Among other possibilities your symptoms might be caused by inadequate oxygen delivery to your heart (angina). Angina that is not treated or evaluated can lead to a heart attack (myocardial infarction) or death.  Blood tests, electrocardiograms, and X-rays may have been done to help determine a possible cause of your chest pain. After evaluation and observation, your health care provider has determined that it is unlikely your pain was caused by an unstable condition that requires hospitalization. However, a full evaluation of your pain may need to be completed, with additional diagnostic testing as directed. It is very important to keep your follow-up appointments. Not keeping your follow-up appointments could result in permanent heart damage, disability, or death. If there is any problem keeping your follow-up appointments, you must call your health care provider.  HOME CARE INSTRUCTIONS   Due to the slight chance that your pain could be angina, it is important to follow your health care provider's treatment plan and also maintain a healthy lifestyle:  · Maintain or work toward achieving a healthy weight.  · Stay physically active and exercise regularly.  · Decrease your salt intake.  · Eat a balanced, healthy diet. Talk to a dietitian to learn about heart-healthy foods.  · Increase your fiber intake by including whole grains, vegetables, fruits, and nuts in your diet.  · Avoid situations that cause stress, anger, or depression.  · Take medicines as advised by your health care provider. Report any side effects to your health care provider. Do not stop medicines or adjust the dosages on your own.  · Quit smoking. Do not use nicotine patches or gum until you check with your health care provider.  · Keep your blood pressure, blood sugar, and cholesterol levels within normal limits.  · Limit alcohol intake to no more than  1 drink per day for women who are not pregnant and 2 drinks per day for men.  · Do not abuse drugs.  SEEK IMMEDIATE MEDICAL CARE IF:  You have severe chest pain or pressure which may include symptoms such as:  · You feel pain or pressure in your arms, neck, jaw, or back.  · You have severe back or abdominal pain, feel sick to your stomach (nauseous), or throw up (vomit).  · You are sweating profusely.  · You are having a fast or irregular heartbeat.  · You feel short of breath while at rest.  · You notice increasing shortness of breath during rest, sleep, or with activity.  · You have chest pain that does not get better after rest or after taking your usual medicine.  · You wake from sleep with chest pain.  · You are unable to sleep because you cannot breathe.  · You develop a frequent cough or you are coughing up blood.  · You feel dizzy, faint, or experience extreme fatigue.  · You develop severe weakness, dizziness, fainting, or chills.  Any of these symptoms may represent a serious problem that is an emergency. Do not wait to see if the symptoms will go away. Call your local emergency services (911 in the [place] Do not drive yourself to the hospital.  MAKE SURE YOU:  · Understand these instructions.  · Will watch your condition.  · Will get help right away if you are not doing well or get worse.  Document Released: 05/20/2010 Document Revised: 04/22/2013 Document Reviewed: 10/17/2012    Exit[employer]® Patient Information ©2015 [employer]. This information is not intended to replace advice given to you by your health care provider. Make sure you discuss any questions you have with your health care provider.

## 2014-09-07 NOTE — Care Management Note (Signed)
Case Management Note  Patient Details  Name: ARVIE BARTHOLOMEW MRN: [id number] Date of Birth: 1931/12/06   Expected Discharge Date:  09/07/14               Expected Discharge Plan:  Home/Self Care  In-House Referral:  NA  Discharge planning Services  CM Consult  Post Acute Care Choice:  NA Choice offered to:  NA  DME Arranged:    DME Agency:     HH Arranged:    [place] Agency:     Status of Service:  Completed, signed off  Medicare Important Message Given:    Date Medicare IM Given:    Medicare IM give by:    Date Additional Medicare IM Given:    Additional Medicare Important Message give by:     If discussed at [place] of Stay Meetings, dates discussed:    Additional Comments: Pt is from home independent at baseline. Pt admitted OBS for chest pain. Pt discharging home today with self care. No CM needs.  Sherald Barge, RN 09/07/2014, 11:12 AM

## 2014-09-07 NOTE — Consult Note (Cosign Needed)
CARDIOLOGY CONSULT NOTE   Patient ID: Michael [name] MRN: [id number] DOB/AGE: Jun 09, 1931 81 y.o.  Admit Date: 09/06/2014 Referring Physician: PTH Primary Physician: Curlene Labrum, MD Consulting Cardiologist: Kate Sable Md Primary Cardiologist: Rozann Lesches MD [facility]) Reason for Consultation: Chest Pain with known CAD  Clinical Summary Michael [name] is a 81 y.o.male with known history of CAD, BMS to RCA 2003, and DES to RCA 2004, and 2005, carotid artery disease s/p carotid endarterectomy, sees Dr. [name] (but not for a few years) PAD, Hypertension and COPD with on going tobacco abuse;  presents to ER with complaints of chest pain with radiation to shoulder and arm  He was admitted to r/o cardiac etiology for chest pain with known CAD.   In ER BP was 128/64, HR 76, O2 Sat 95%. Labs were unremarkable, CXR negative for CHF, but noted COPD. Troponin negative x 4. EKG demonstrated NSR with 1st degree AVB. No acute ST-T wave abnormalities. He was treated with ASA, NTG sublingual, fentanyl.   He states the pain began Sunday morning after showering to go to church. He felt chest pressure on the left side with radiation to his shoulder on the left, but had bilateral arm pain. Lasted one hour. He took 3 NTG tabs sub lingual with no relief and therefore came to ER. He states the pain improved in ER but he still feels it as a slight pressure that has never really gone away. He admits to helping a lady move over the weekend, and was lifting some slightly heavy items.   Allergies  Allergen Reactions  . Ciprofloxacin Nausea Only  . Iron Itching  . Lyrica [Pregabalin] Other (See Comments)    Unknown reaction  . Morphine And Related Other (See Comments)    Unknown reaction  . Oxycodone Hives  . Penicillins Hives  . Hydrocodone-Acetaminophen Rash    Confusion    Medications Scheduled Medications: . amLODipine  10 mg Oral q morning - 10a  . aspirin  81 mg Oral Daily  . atorvastatin   40 mg Oral QHS  . citalopram  20 mg Oral q morning - 10a  . clobetasol ointment   Topical Daily  . famotidine  20 mg Oral Daily  . ferrous sulfate  325 mg Oral QHS  . fluticasone  1 spray Each Nare Daily  . heparin  5,000 Units Subcutaneous 3 times per day  . isosorbide mononitrate  60 mg Oral BID  . levothyroxine  125 mcg Oral QAC breakfast  . loratadine  10 mg Oral Daily  . mometasone-formoterol  2 puff Inhalation BID  . omega-3 acid ethyl esters  1 g Oral BID  . tamsulosin  0.4 mg Oral QHS  . theophylline  100 mg Oral BID  . tiotropium  18 mcg Inhalation Daily    Infusions:    PRN Medications: acetaminophen, albuterol, nitroGLYCERIN, nitroGLYCERIN, ondansetron (ZOFRAN) IV, traZODone   Past Medical History  Diagnosis Date  . Hyperthyroidism 1978    Status post radioactive iodine treatment  . History of herpes zoster 2001  . Vitreous hemorrhage MAY 2009     Right eye  . Peripheral arterial disease   . Mixed hyperlipidemia   . Essential hypertension, benign   . Allergic rhinitis   . COPD (chronic obstructive pulmonary disease)   . History of asbestos exposure   . Depression   . Anemia   . Carotid artery occlusion   . History of myocardial infarction   . Coronary atherosclerosis of native  coronary artery     BMS RCA 2003, DES RCA 2004, DES RCA 2005   . GERD (gastroesophageal reflux disease)   . Hemorrhoids   . Head injury, closed, with brief LOC   . HOH (hard of hearing)   . Hypothyroidism   . Lumbar pain   . Psoriasis   . Prostate cancer     Status post XRT/IMRT 2008 - Dr. Alinda Money  . Renal cancer     Cryoablation in 2012  . Pneumonia     November 2014    Past Surgical History  Procedure Laterality Date  . Popliteal synovial cyst excision      knee  . Cervical disc surgery  APRIL 2004    fusion  . Partial knee arthroplasty  JULY 2005    Right-replacement  . Nasal septum surgery      SMR  . Neck disk fusion  07/2002  . Knee arthroplasty      bilateral   . Endarterectomy  08/30/2011    Procedure: Left ENDARTERECTOMY CAROTID;  Surgeon: Conrad [place], MD;  Location: [place];  Service: Vascular;  Laterality: Left;  . Joint replacement Right 2005    Right partial Knee  . Joint replacement Left 2011    Left toatal Knee  . Endarterectomy Right 12/18/2012    Procedure: ENDARTERECTOMY CAROTID WITH BOVINE PATCH ANGIOPLASTY;  Surgeon: Conrad [place], MD;  Location: [place];  Service: Vascular;  Laterality: Right;  . Carotid endarterectomy    . Cholecystectomy  1995  . Cardiac catheterization      AUG 2003, AUG 2004, OCT 2005 9 HAS 3 STENTS DONE AT 3 DIFFERENT CATHS - ONE IS DRUG ELUTING STENT; CATH 2006, 2007, 2009 2010  . Eye surgery  2010    bilat cataract.2009-Vitreous Hemorrhage-2009  . Total knee revision Right 08/06/2013    Procedure: REVISION RIGHT TOTAL KNEE ARTHROPLASTY  ;  Surgeon: Gearlean Alf, MD;  Location: [facility];  Service: Orthopedics;  Laterality: Right;    Family History  Problem Relation Age of Onset  . Coronary artery disease Brother   . Cancer Brother   . Heart disease Brother   . Heart attack Brother   . Anesthesia problems Neg Hx     Social History Michael [name] reports that he has been smoking Cigarettes.  He started smoking about 70 years ago. He has a 15 pack-year smoking history. He has never used smokeless tobacco. Michael [name] reports that he does not drink alcohol.  Review of Systems Complete review of systems are found to be negative unless outlined in H&P above.  Physical Examination Blood pressure 123/58, pulse 54, temperature 97.9 F (36.6 C), temperature source Oral, resp. rate 16, height '5\' 7"'  (1.702 m), weight 155 lb 4 oz (70.421 kg), SpO2 95 %.  Intake/Output Summary (Last 24 hours) at 09/07/14 0920 Last data filed at 09/07/14 0831  Gross per 24 hour  Intake    240 ml  Output      0 ml  Net    240 ml    Telemetry: NSR  GEN: No acute distress HEENT: Conjunctiva and lids normal, oropharynx clear with  moist mucosa. Neck: Supple, no elevated JVP or carotid bruits, no thyromegaly. Lungs: Some crackles with coughing, Cardiac: Regular rate and rhythm, no S3 or significant systolic murmur, no pericardial rub. Abdomen: Soft, nontender, no hepatomegaly, bowel sounds present, no guarding or rebound. Extremities: No pitting edema, distal pulses 2+. Skin: Warm and dry. Musculoskeletal: No kyphosis. Neuropsychiatric: Alert  and oriented x3, affect grossly appropriate.  Prior Cardiac Testing/Procedures 1. Stress Test 10/15/2012 No evidence of ischemia, no wall motion abnormalities, Low risk scan.   Lab Results  Basic Metabolic Panel:  Recent Labs Lab 09/06/14 1436  NA 135  K 3.9  CL 101  CO2 27  GLUCOSE 100*  BUN 13  CREATININE 1.17  CALCIUM 9.3    Liver Function Tests:  Recent Labs Lab 09/06/14 1436  AST 24  ALT 15*  ALKPHOS 70  BILITOT 0.5  PROT 7.1  ALBUMIN 3.8    CBC:  Recent Labs Lab 09/06/14 1436  WBC 9.8  NEUTROABS 6.6  HGB 13.6  HCT 40.3  MCV 96.0  PLT 263    Cardiac Enzymes:  Recent Labs Lab 09/06/14 1436 09/06/14 1623 09/06/14 2251 09/07/14 0414  TROPONINI <0.03 <0.03 <0.03 <0.03     Radiology: Dg Chest 2 View  09/06/2014   CLINICAL DATA:  Left-sided chest pain  EXAM: CHEST  2 VIEW  COMPARISON:  07/03/2014  FINDINGS: Cardiac shadow is stable. The lungs are well aerated bilaterally. Mild thickening of the minor fissure is noted but stable. No focal infiltrate or sizable effusion is seen. Some scarring is noted in the bases bilaterally. No bony abnormality is seen. Hyperinflation is again noted.  IMPRESSION: COPD without acute abnormality.   Electronically Signed   By: Inez Catalina M.D.   On: 09/06/2014 15:10     ECG:NSR with lst degree block, rate of 72 bpm.   Impression and Recommendations  1. Chest Pain: Typical and atypical presentation. States it is really no different than the pain he has experienced in the past, but become worse  Sunday morning after taking a shower. Not relieved by NTG SL at home, but improvement in ER with NTG SL and fentanyl. States still feels pressure with arm soreness. Did some lifting and pushing and pulling over the weekend helping a friend move. Can consider increasing  long acting nitrates, home dose is 60 mg increase to 90 mg, isosorbide , as he is not on BB due to COPD. Continue ASA, statin  Troponin is negative X 4. No acute ST-T wave changes. Last stress test in 2014 negative for ischemia. Would continue to treat medically. May be musculoskeletal in etiology. Pain is not reproducible to palpation.  Can consider repeat stress test as OP.   2. COPD: Ongoing tobacco abuse. Uses inhalers.   3. Hypercholesterolemia:  Continue statin.   4. Carotid Artery Disease: Has not see Dr. [name] in over 2 years.   5. Hypertension: Continue amlodipine, well controlled currently.   6. Bradycardia: May consider cardiac monitor as OP. If HR remains in the 50';s consider stopping amlodipine.   Signed: Phill Myron. Dagen Beevers NP [facility]  09/07/2014, 9:20 AM Co-Sign MD

## 2014-09-07 NOTE — Discharge Summary (Signed)
Physician Discharge Summary  Michael [name] IFO:[id number] DOB: 11-29-1931 DOA: 09/06/2014  PCP: Curlene Labrum, MD  Admit date: 09/06/2014 Discharge date: 09/07/2014  Recommendations for Outpatient Follow-up:  1. No new changes in medications. Patient instructed to follow-up with primary care physician for follow-up on this recent hospitalization for chest pain.  Discharge Diagnoses:  Active Problems:   Mixed hyperlipidemia   TOBACCO USER   CAD, NATIVE VESSEL   COPD with emphysema   Carotid stenosis, bilateral   Essential (primary) hypertension   Chest pain   Acute chest pain    Discharge Condition: stable   Diet recommendation: as tolerated   History of present illness:  81 y.o. male with past medical history of coronary artery disease, stent placement about 10 years ago, smoker, COPD, hypertension, dyslipidemia, hypothyroidism. She presented to [facility] with main concern of left-sided chest pain which started at rest around 10 AM in the morning prior to the admission. Patient reported chest pain pain to be sharp, constant 8 out of 10 in intensity, radiating to left arm, lasting approximately 30 minutes. Patient reports taking sublingual nitroglycerin which provided some symptomatic relief. No associated shortness of breath, palpitations.  On admission, pt was hemodynamically stable. No acute ischemic changes were seen on 12 lead EKG. Troponin levels were WNL.     Hospital Course:   Principal Problem: Chest pain - Likely musculoskeletal. Pain completely resolved this am. - No acute ischemic changes identified on 12 lead EKG - Troponin level x 3 negative.  - Cardio has seen the pt in consultation. Recommended increasing long acting Imdur lbut since pain controlled now will resume his usual Imdur regimen.   Active Problems:   Mixed hyperlipidemia - Resume omega 3 and Lipitor    TOBACCO USER - Smoking cessation counseling provided.    Essential (primary) hypertension -  Continue Norvasc, Imdur     COPD - Stable respiratory status. Continue albuterol inhaler as needed. Continue Advair scheduled daily. - Continue theophylline and Spiriva as per prior home regimen.   SignedLeisa Lenz, MD  [facility] 09/07/2014, 10:39 AM  Pager #: [phone]  Time spent in minutes: less than 30 minutes   Discharge Exam: Filed Vitals:   09/07/14 1004  BP:   Pulse: 55  Temp:   Resp: 16   Filed Vitals:   09/07/14 0237 09/07/14 0621 09/07/14 0723 09/07/14 1004  BP: 113/53 123/58    Pulse: 52 54  55  Temp: 97.8 F (36.6 C) 97.9 F (36.6 C)    TempSrc: Oral Oral    Resp: '16 16  16  '$ Height:      Weight:      SpO2: 90% 94% 95% 96%    General: Pt is alert, follows commands appropriately, not in acute distress Cardiovascular: Regular rate and rhythm, S1/S2 +, no murmurs Respiratory: Clear to auscultation bilaterally, no wheezing, no crackles, no rhonchi Abdominal: Soft, non tender, non distended, bowel sounds +, no guarding Extremities: no edema, no cyanosis, pulses palpable bilaterally DP and PT Neuro: Grossly nonfocal  Discharge Instructions  Discharge Instructions    Call MD for:  difficulty breathing, headache or visual disturbances    Complete by:  As directed      Call MD for:  persistant nausea and vomiting    Complete by:  As directed      Call MD for:  severe uncontrolled pain    Complete by:  As directed      Diet - low sodium heart  healthy    Complete by:  As directed      Increase activity slowly    Complete by:  As directed             Medication List    TAKE these medications        acetaminophen 650 MG CR tablet  Commonly known as:  TYLENOL  Take 1,300 mg by mouth every 8 (eight) hours as needed for pain.     ADVAIR DISKUS 250-50 MCG/DOSE Aepb  Generic drug:  Fluticasone-Salmeterol  Inhale 1 puff into the lungs every 12 (twelve) hours.     amLODipine 10 MG tablet  Commonly known as:  NORVASC  Take 10 mg by  mouth every morning.     aspirin 81 MG tablet  Take 81 mg by mouth every morning.     atorvastatin 40 MG tablet  Commonly known as:  LIPITOR  Take 40 mg by mouth at bedtime.     cetirizine 10 MG tablet  Commonly known as:  ZYRTEC  Take 10 mg by mouth every morning.     citalopram 20 MG tablet  Commonly known as:  CELEXA  Take 20 mg by mouth every morning.     clobetasol cream 0.05 %  Commonly known as:  TEMOVATE  Apply 1 application topically daily. Applied to legs for psoriasis     ferrous sulfate 325 (65 FE) MG tablet  Take 325 mg by mouth at bedtime.     FLOMAX 0.4 MG Caps capsule  Generic drug:  tamsulosin  Take 0.4 mg by mouth at bedtime.     isosorbide mononitrate 60 MG 24 hr tablet  Commonly known as:  IMDUR  Take 1 tablet (60 mg total) by mouth 2 (two) times daily.     levothyroxine 125 MCG tablet  Commonly known as:  SYNTHROID, LEVOTHROID  Take 125 mcg by mouth every morning.     mometasone 50 MCG/ACT nasal spray  Commonly known as:  NASONEX  Place 2 sprays into the nose at bedtime.     multivitamin tablet  Take 1 tablet by mouth every morning.     nitroGLYCERIN 0.4 MG SL tablet  Commonly known as:  NITROSTAT  Place 1 tablet (0.4 mg total) under the tongue every 5 (five) minutes as needed. For chest pain     Omega 3 1200 MG Caps  Take 2 capsules by mouth 2 (two) times daily. Take 2 capsules in AM and 2 capsules in  PM     polyethylene glycol packet  Commonly known as:  MIRALAX / GLYCOLAX  Take 17 g by mouth daily as needed for mild constipation.     PROAIR HFA 108 (90 BASE) MCG/ACT inhaler  Generic drug:  albuterol  Inhale 2 puffs into the lungs every 6 (six) hours as needed. For shortness of breath     ranitidine 150 MG tablet  Commonly known as:  ZANTAC  Take 150 mg by mouth every morning.     SALONPAS ARTHRITIS PAIN RELIEF Pads  Apply 1 each topically as needed (Shoulder Pain).     theophylline 100 MG 12 hr tablet  Commonly known as:   THEODUR  Take 1 tablet (100 mg total) by mouth 2 (two) times daily.     tiotropium 18 MCG inhalation capsule  Commonly known as:  SPIRIVA  Place 18 mcg into inhaler and inhale daily.     traZODone 100 MG tablet  Commonly known as:  DESYREL  Take 100 mg  by mouth at bedtime as needed. For sleep           Follow-up Information    Follow up with Curlene Labrum, MD. Schedule an appointment as soon as possible for a visit in 1 week.   Why:  Follow up appt after recent hospitalization   Contact information:   [address] [place] [address] [phone]        The results of significant diagnostics from this hospitalization (including imaging, microbiology, ancillary and laboratory) are listed below for reference.    Significant Diagnostic Studies: Dg Chest 2 View  09/06/2014   CLINICAL DATA:  Left-sided chest pain  EXAM: CHEST  2 VIEW  COMPARISON:  07/03/2014  FINDINGS: Cardiac shadow is stable. The lungs are well aerated bilaterally. Mild thickening of the minor fissure is noted but stable. No focal infiltrate or sizable effusion is seen. Some scarring is noted in the bases bilaterally. No bony abnormality is seen. Hyperinflation is again noted.  IMPRESSION: COPD without acute abnormality.   Electronically Signed   By: Inez Catalina M.D.   On: 09/06/2014 15:10   Dg Thoracic Spine 2 View  08/24/2014   CLINICAL DATA:  Prostate cancer.  Abnormal bone scan.  EXAM: THORACIC SPINE - 2 VIEW  COMPARISON:  None.  FINDINGS: There are degenerative changes in the mid thoracic spine, corresponding to the increased uptake on prior bone scan. Note visible focal abnormality or fracture. Normal alignment.  IMPRESSION: Degenerative changes in the mid thoracic spine corresponding to area of abnormal uptake on bone scan. No fracture.   Electronically Signed   By: Rolm Baptise M.D.   On: 08/24/2014 16:47   Dg Lumbar Spine Complete  08/24/2014   CLINICAL DATA:  Increased uptake within the lumbar spine on  recent bone scan. Prostate cancer.  EXAM: LUMBAR SPINE - COMPLETE 4+ VIEW  COMPARISON:  Bone scintigraphy 08/20/2014  FINDINGS: Normal alignment of the lumbar spine. The vertebral body heights are well preserved. There is marked disc space narrowing and ventral endplate spurring at the L2-3 level with associated endplate sclerosis consistent with degenerative disc disease. This corresponds to the abnormal area of increased uptake within the lumbar spine on recent bone scan. Degenerative disc disease is also noted at L5-S1. Aortic atherosclerosis is noted.  IMPRESSION: 1. Advanced L2-3 degenerative disc disease corresponds to the abnormal area of increased uptake on recent bone scintigraphy. 2. Aortic atherosclerosis.   Electronically Signed   By: Kerby Moors M.D.   On: 08/24/2014 16:47   Nm Bone Scan Whole Body  08/20/2014   CLINICAL DATA:  History of prostate malignancy bilateral atraumatic shoulder pain  EXAM: NUCLEAR MEDICINE WHOLE BODY BONE SCAN  TECHNIQUE: Whole body anterior and posterior images were obtained approximately 3 hours after intravenous injection of radiopharmaceutical.  RADIOPHARMACEUTICALS:  27.0 mCi Technetium-99 MDP  COMPARISON:  None.  FINDINGS: There is adequate uptake of the radiopharmaceutical by the skeleton. There is adequate soft tissue clearance and renal activity. There is increased uptake within both shoulders greater on the right than on the left. The uptake is centered in the glenohumeral joint region. There is increased uptake in the body of T8 and approximately L2 or L3. Uptake over the pelvis is normal. There are prosthetic knees bilaterally with no significant abnormal uptake. There is increased uptake in the first carpometacarpal joints bilaterally.  IMPRESSION: 1. Increased uptake within both shoulders is likely degenerative. Plain films of the shoulders are recommended. 2. Increased uptake in the mid  thoracic and mid lumbar spine is nonspecific and could reflect  metastatic disease or degenerative changes. Thoracic and lumbar spine series are recommended.   Electronically Signed   By: David  Martinique M.D.   On: 08/20/2014 14:07    Microbiology: No results found for this or any previous visit (from the past 240 hour(s)).   Labs: Basic Metabolic Panel:  Recent Labs Lab 09/06/14 1436  NA 135  K 3.9  CL 101  CO2 27  GLUCOSE 100*  BUN 13  CREATININE 1.17  CALCIUM 9.3   Liver Function Tests:  Recent Labs Lab 09/06/14 1436  AST 24  ALT 15*  ALKPHOS 70  BILITOT 0.5  PROT 7.1  ALBUMIN 3.8   No results for input(s): LIPASE, AMYLASE in the last 168 hours. No results for input(s): AMMONIA in the last 168 hours. CBC:  Recent Labs Lab 09/06/14 1436  WBC 9.8  NEUTROABS 6.6  HGB 13.6  HCT 40.3  MCV 96.0  PLT 263   Cardiac Enzymes:  Recent Labs Lab 09/06/14 1436 09/06/14 1623 09/06/14 2251 09/07/14 0414  TROPONINI <0.03 <0.03 <0.03 <0.03   BNP: BNP (last 3 results) No results for input(s): BNP in the last 8760 hours.  ProBNP (last 3 results)  Recent Labs  02/15/14 1255  PROBNP 98.4    CBG: No results for input(s): GLUCAP in the last 168 hours.

## 2014-09-10 ENCOUNTER — Encounter: Payer: Self-pay | Admitting: Cardiology

## 2014-09-15 DIAGNOSIS — M7551 Bursitis of right shoulder: Secondary | ICD-10-CM | POA: Diagnosis not present

## 2014-09-15 DIAGNOSIS — M7501 Adhesive capsulitis of right shoulder: Secondary | ICD-10-CM | POA: Diagnosis not present

## 2014-09-21 ENCOUNTER — Telehealth: Payer: Self-pay | Admitting: Internal Medicine

## 2014-09-21 MED ORDER — TIOTROPIUM BROMIDE MONOHYDRATE 18 MCG IN CAPS: 18.0000 ug | ORAL_CAPSULE | Freq: Every day | RESPIRATORY_TRACT | Status: DC

## 2014-09-21 NOTE — Telephone Encounter (Signed)
Called and spoke to pt's daughter, Richrd Prime. Pt needing a 30 day supply sent to local pharmacy and then will call back when ready for the rx to be sent to mail order [facility]. Rx sent to local pharmacy. [name] verbalized understanding and denied any further questions or concerns at this time.

## 2014-09-24 ENCOUNTER — Encounter: Payer: Self-pay | Admitting: Cardiology

## 2014-09-24 ENCOUNTER — Ambulatory Visit: Payer: Medicare Other | Attending: Family Medicine | Admitting: Physical Therapy

## 2014-09-24 DIAGNOSIS — M25611 Stiffness of right shoulder, not elsewhere classified: Secondary | ICD-10-CM

## 2014-09-24 DIAGNOSIS — M7501 Adhesive capsulitis of right shoulder: Secondary | ICD-10-CM | POA: Diagnosis not present

## 2014-09-24 DIAGNOSIS — M25511 Pain in right shoulder: Secondary | ICD-10-CM

## 2014-09-24 NOTE — Therapy (Signed)
[facility] [place], [place], [address] Phone: [phone]   Fax:  [phone]  Physical Therapy Evaluation  Patient Details  Name: Michael [name] MRN: [id number] Date of Birth: April 21, 1932 Referring Provider:  Curlene Labrum, MD  Encounter Date: 09/24/2014      PT End of Session - 09/24/14 1045    Visit Number 1   Number of Visits 12   Date for PT Re-Evaluation 11/19/14   PT Start Time 1030   PT Stop Time 1110   PT Time Calculation (min) 40 min   Activity Tolerance Patient tolerated treatment well   Behavior During Therapy [facility] for tasks assessed/performed      Past Medical History  Diagnosis Date  . Hyperthyroidism 1978    Status post radioactive iodine treatment  . History of herpes zoster 2001  . Vitreous hemorrhage MAY 2009     Right eye  . Peripheral arterial disease   . Mixed hyperlipidemia   . Essential hypertension, benign   . Allergic rhinitis   . COPD (chronic obstructive pulmonary disease)   . History of asbestos exposure   . Depression   . Anemia   . Carotid artery occlusion   . History of myocardial infarction   . Coronary atherosclerosis of native coronary artery     BMS RCA 2003, DES RCA 2004, DES RCA 2005   . GERD (gastroesophageal reflux disease)   . Hemorrhoids   . Head injury, closed, with brief LOC   . HOH (hard of hearing)   . Hypothyroidism   . Lumbar pain   . Psoriasis   . Prostate cancer     Status post XRT/IMRT 2008 - Dr. Alinda Money  . Renal cancer     Cryoablation in 2012  . Pneumonia     November 2014    Past Surgical History  Procedure Laterality Date  . Popliteal synovial cyst excision      knee  . Cervical disc surgery  APRIL 2004    fusion  . Partial knee arthroplasty  JULY 2005    Right-replacement  . Nasal septum surgery      SMR  . Neck disk fusion  07/2002  . Knee arthroplasty      bilateral  . Endarterectomy  08/30/2011    Procedure: Left ENDARTERECTOMY  CAROTID;  Surgeon: Conrad Chamblee, MD;  Location: [facility];  Service: Vascular;  Laterality: Left;  . Joint replacement Right 2005    Right partial Knee  . Joint replacement Left 2011    Left toatal Knee  . Endarterectomy Right 12/18/2012    Procedure: ENDARTERECTOMY CAROTID WITH BOVINE PATCH ANGIOPLASTY;  Surgeon: Conrad Pajaro Dunes, MD;  Location: [facility];  Service: Vascular;  Laterality: Right;  . Carotid endarterectomy    . Cholecystectomy  1995  . Cardiac catheterization      AUG 2003, AUG 2004, OCT 2005 9 HAS 3 STENTS DONE AT 3 DIFFERENT CATHS - ONE IS DRUG ELUTING STENT; CATH 2006, 2007, 2009 2010  . Eye surgery  2010    bilat cataract.2009-Vitreous Hemorrhage-2009  . Total knee revision Right 08/06/2013    Procedure: REVISION RIGHT TOTAL KNEE ARTHROPLASTY  ;  Surgeon: Gearlean Alf, MD;  Location: [facility];  Service: Orthopedics;  Laterality: Right;    There were no vitals filed for this visit.  Visit Diagnosis:  Right shoulder pain  Shoulder stiffness, right      Subjective Assessment - 09/24/14 1101    Subjective Right  shoulder has been worse over the last 6 months.   Patient Stated Goals Been able to use my right shoulder with less pain.   Pain Score 8    Pain Location Shoulder   Pain Orientation Right   Pain Descriptors / Indicators Aching            [facility] PT Assessment - 09/24/14 0001    Assessment   Medical Diagnosis Right adhesive capsulitis.   Onset Date/Surgical Date --  6 months.   Precautions   Precautions None   Restrictions   Weight Bearing Restrictions No   Balance Screen   Has the patient fallen in the past 6 months No   Has the patient had a decrease in activity level because of a fear of falling?  No   Is the patient reluctant to leave their home because of a fear of falling?  No   Home Environment   Living Environment Private residence   Prior Function   Level of Independence Independent   Observation/Other Assessments   Focus on Therapeutic Outcomes  (FOTO)  38% limitation.   Posture/Postural Control   Posture/Postural Control Postural limitations   Postural Limitations Rounded Shoulders;Forward head;Increased thoracic kyphosis   ROM / Strength   AROM / PROM / Strength AROM;PROM;Strength   AROM   AROM Assessment Site Shoulder   Right/Left Shoulder Right   Right Shoulder Flexion --  90 degrees.   Right Shoulder Internal Rotation --  To right SIJ.   Right Shoulder External Rotation --  20 degrees.   PROM   PROM Assessment Site Shoulder   Right/Left Shoulder Right   Right Shoulder Flexion --  95 degrees.   Right Shoulder External Rotation --  25 degrees.   Strength   Strength Assessment Site Shoulder   Right/Left Shoulder Right   Right Shoulder Flexion --  4/5.   Right Shoulder Internal Rotation --  4 to 4+/5.   Right Shoulder External Rotation --  3-/5.   Palpation   Palpation comment --  Pain "in" right shoulder.   Special Tests    Special Tests --  Normal UE DTR's.                   [facility] Adult PT Treatment/Exercise - 09/24/14 0001    Exercises   Exercises Shoulder   Shoulder Exercises: Pulleys   Flexion --  5 minutes.   Shoulder Exercises: ROM/Strengthening   UBE (Upper Arm Bike) --  8 minutes at 120 RPM's.                  PT Short Term Goals - 09/24/14 1109    PT SHORT TERM GOAL #1   Title Independent with HEP.   Time 2   Period Weeks   Status New           PT Long Term Goals - 09/24/14 1110    PT LONG TERM GOAL #1   Title Active right shoulder flexion to 145 degrees so the patient can easily reach overhead   Time 6   Period Weeks   Status New   PT LONG TERM GOAL #2   Title Active ER to 60 degrees+ to allow for easily donning/doffing of apparel   Time 6   Period Weeks   Status New   PT LONG TERM GOAL #3   Title Increase ROM so patient is able to reach behind back to L3 with right index finger.   Time 6   Period  Weeks   Status New   PT LONG TERM GOAL #4   Title  Increase right shoulder strength to 4+/5 so he can more easily complete functional activities that require anti-gravity movement.   Time 6   Period Weeks   Status New               Plan - 09-26-2014 1103    Clinical Impression Statement The patient reports progressively worsening right shoulder pain over the last 6 months.  His pain-level with right shoulder usage is 7-8/10.  He states he feels like the pain is coming from "in" the shollder.     Pt will benefit from skilled therapeutic intervention in order to improve on the following deficits Pain;Decreased range of motion;Decreased strength   Rehab Potential Good   PT Frequency 2x / week   PT Duration 6 weeks   PT Treatment/Interventions ADLs/Self Care Home Management;Electrical Stimulation;Cryotherapy;Moist Heat;Ultrasound;Therapeutic exercise;Therapeutic activities;Manual techniques;Passive range of motion   PT Next Visit Plan Right shoulder ROM; UBE; pulleys; UE Ranger; capsular stretching; strengthening are ROM improves.          G-Codes - 2014-09-26 1113    Functional Assessment Tool Used FOTO.   Functional Limitation Mobility: Walking and moving around   Mobility: Walking and Moving Around Current Status [phone]) At least 20 percent but less than 40 percent impaired, limited or restricted   Mobility: Walking and Moving Around Goal Status [phone]) At least 1 percent but less than 20 percent impaired, limited or restricted       Problem List Patient Active Problem List   Diagnosis Date Noted  . Acute chest pain 09/06/2014  . Chest pain at rest   . Essential hypertension   . Dyslipidemia   . Other specified hypothyroidism   . Carotid stenosis 02/20/2014  . Chest pain 02/15/2014  . Postoperative anemia due to acute blood loss 08/07/2013  . Pain due to unicompartmental arthroplasty of knee 08/06/2013  . Preoperative cardiovascular examination 06/18/2013  . Carotid stenosis, bilateral 08/04/2011  . Carotid artery  obstruction 02/03/2011  . TOBACCO USER 10/08/2009  . Clinical depression 02/25/2009  . Mixed hyperlipidemia 12/22/2008  . Essential hypertension, benign 12/22/2008  . CAD, NATIVE VESSEL 12/22/2008  . PVD 12/22/2008  . Essential (primary) hypertension 12/22/2008  . HLD (hyperlipidemia) 12/22/2008  . Peripheral blood vessel disorder 12/22/2008  . COPD with emphysema 11/29/2007  . ASBESTOS EXPOSURE, HX OF 11/29/2007  . CAFL (chronic airflow limitation) 11/29/2007    Raheim Beutler, [place] MPT 26-Sep-2014, 11:22 AM  [facility] [address] [place], [place], [address] Phone: [phone]   Fax:  [phone]

## 2014-09-29 ENCOUNTER — Encounter: Payer: Self-pay | Admitting: *Deleted

## 2014-09-29 ENCOUNTER — Ambulatory Visit: Payer: Medicare Other | Admitting: *Deleted

## 2014-09-29 DIAGNOSIS — M7501 Adhesive capsulitis of right shoulder: Secondary | ICD-10-CM | POA: Diagnosis not present

## 2014-09-29 DIAGNOSIS — M25611 Stiffness of right shoulder, not elsewhere classified: Secondary | ICD-10-CM

## 2014-09-29 DIAGNOSIS — M25511 Pain in right shoulder: Secondary | ICD-10-CM

## 2014-09-29 NOTE — Therapy (Signed)
[facility] [place], [place], [address] Phone: [phone]   Fax:  [phone]  Physical Therapy Treatment  Patient Details  Name: Michael [name] MRN: [id number] Date of Birth: 1931-06-06 Referring Provider:  Curlene Labrum, MD  Encounter Date: 09/29/2014      PT End of Session - 09/29/14 1440    Visit Number 2   Number of Visits 12   Date for PT Re-Evaluation 11/19/14   PT Start Time 7893   PT Stop Time 1434   PT Time Calculation (min) 49 min      Past Medical History  Diagnosis Date  . Hyperthyroidism 1978    Status post radioactive iodine treatment  . History of herpes zoster 2001  . Vitreous hemorrhage MAY 2009     Right eye  . Peripheral arterial disease   . Mixed hyperlipidemia   . Essential hypertension, benign   . Allergic rhinitis   . COPD (chronic obstructive pulmonary disease)   . History of asbestos exposure   . Depression   . Anemia   . Carotid artery occlusion   . History of myocardial infarction   . Coronary atherosclerosis of native coronary artery     BMS RCA 2003, DES RCA 2004, DES RCA 2005   . GERD (gastroesophageal reflux disease)   . Hemorrhoids   . Head injury, closed, with brief LOC   . HOH (hard of hearing)   . Hypothyroidism   . Lumbar pain   . Psoriasis   . Prostate cancer     Status post XRT/IMRT 2008 - Dr. Alinda Money  . Renal cancer     Cryoablation in 2012  . Pneumonia     November 2014    Past Surgical History  Procedure Laterality Date  . Popliteal synovial cyst excision      knee  . Cervical disc surgery  APRIL 2004    fusion  . Partial knee arthroplasty  JULY 2005    Right-replacement  . Nasal septum surgery      SMR  . Neck disk fusion  07/2002  . Knee arthroplasty      bilateral  . Endarterectomy  08/30/2011    Procedure: Left ENDARTERECTOMY CAROTID;  Surgeon: Conrad Bolton, MD;  Location: [place];  Service: Vascular;  Laterality: Left;  . Joint replacement  Right 2005    Right partial Knee  . Joint replacement Left 2011    Left toatal Knee  . Endarterectomy Right 12/18/2012    Procedure: ENDARTERECTOMY CAROTID WITH BOVINE PATCH ANGIOPLASTY;  Surgeon: Conrad Norton, MD;  Location: [place];  Service: Vascular;  Laterality: Right;  . Carotid endarterectomy    . Cholecystectomy  1995  . Cardiac catheterization      AUG 2003, AUG 2004, OCT 2005 9 HAS 3 STENTS DONE AT 3 DIFFERENT CATHS - ONE IS DRUG ELUTING STENT; CATH 2006, 2007, 2009 2010  . Eye surgery  2010    bilat cataract.2009-Vitreous Hemorrhage-2009  . Total knee revision Right 08/06/2013    Procedure: REVISION RIGHT TOTAL KNEE ARTHROPLASTY  ;  Surgeon: Gearlean Alf, MD;  Location: [facility];  Service: Orthopedics;  Laterality: Right;    There were no vitals filed for this visit.  Visit Diagnosis:  Right shoulder pain  Shoulder stiffness, right      Subjective Assessment - 09/29/14 1348    Subjective Did good after last Rx.  pain is 5-6/10 today RT shldr   Patient Stated Goals Been  able to use my right shoulder with less pain.   Currently in Pain? Yes   Pain Score 6    Pain Descriptors / Indicators Aching   Aggravating Factors  overhead Act.s   Pain Relieving Factors rest                         [facility] Adult PT Treatment/Exercise - 09/29/14 0001    Exercises   Exercises Shoulder   Shoulder Exercises: Pulleys   Flexion 3 minutes   Shoulder Exercises: ROM/Strengthening   UBE (Upper Arm Bike) x 10 min at 120 RPMs  8 minutes at 120 RPM's.   Modalities   Modalities Electrical Stimulation;Moist Heat;Ultrasound   Moist Heat Therapy   Number Minutes Moist Heat 15 Minutes   Moist Heat Location Shoulder   Electrical Stimulation   Electrical Stimulation Location RT   Electrical Stimulation Action Pre-mod   Electrical Stimulation Parameters 80-'150hz'$    Electrical Stimulation Goals Pain   Manual Therapy   Manual Therapy Passive ROM   Passive ROM PROM to RT shldr with  pt supine. Flexion and ER with low load end-range holds Scapula and GHJ mobs                  PT Short Term Goals - 09/24/14 1109    PT SHORT TERM GOAL #1   Title Independent with HEP.   Time 2   Period Weeks   Status New           PT Long Term Goals - 09/24/14 1110    PT LONG TERM GOAL #1   Title Active right shoulder flexion to 145 degrees so the patient can easily reach overhead   Time 6   Period Weeks   Status New   PT LONG TERM GOAL #2   Title Active ER to 60 degrees+ to allow for easily donning/doffing of apparel   Time 6   Period Weeks   Status New   PT LONG TERM GOAL #3   Title Increase ROM so patient is able to reach behind back to L3 with right index finger.   Time 6   Period Weeks   Status New   PT LONG TERM GOAL #4   Title Increase right shoulder strength to 4+/5 so he can more easily complete functional activities that require anti-gravity movement.   Time 6   Period Weeks   Status New               Plan - 09/29/14 1441    Clinical Impression Statement Pt did fair with Rx, but remains guarded due to pain. His ROM for elevation was better today,but ER was about the same.   Pt will benefit from skilled therapeutic intervention in order to improve on the following deficits Pain;Decreased range of motion;Decreased strength   Rehab Potential Good   PT Frequency 2x / week   PT Duration 6 weeks   PT Treatment/Interventions ADLs/Self Care Home Management;Electrical Stimulation;Cryotherapy;Moist Heat;Ultrasound;Therapeutic exercise;Therapeutic activities;Manual techniques;Passive range of motion   PT Next Visit Plan Right shoulder ROM; UBE; pulleys; UE Ranger; capsular stretching; strengthening are ROM improves.[place]        Problem List Patient Active Problem List   Diagnosis Date Noted  . Acute chest pain 09/06/2014  . Chest pain at rest   . Essential hypertension   . Dyslipidemia   . Other specified hypothyroidism   . Carotid stenosis  02/20/2014  . Chest pain 02/15/2014  .  Postoperative anemia due to acute blood loss 08/07/2013  . Pain due to unicompartmental arthroplasty of knee 08/06/2013  . Preoperative cardiovascular examination 06/18/2013  . Carotid stenosis, bilateral 08/04/2011  . Carotid artery obstruction 02/03/2011  . TOBACCO USER 10/08/2009  . Clinical depression 02/25/2009  . Mixed hyperlipidemia 12/22/2008  . Essential hypertension, benign 12/22/2008  . CAD, NATIVE VESSEL 12/22/2008  . PVD 12/22/2008  . Essential (primary) hypertension 12/22/2008  . HLD (hyperlipidemia) 12/22/2008  . Peripheral blood vessel disorder 12/22/2008  . COPD with emphysema 11/29/2007  . ASBESTOS EXPOSURE, HX OF 11/29/2007  . CAFL (chronic airflow limitation) 11/29/2007    Mikylah Ackroyd,CHRIS, PTA 09/29/2014, 2:53 PM  [facility] [address] [place], [place], [address] Phone: [phone]   Fax:  [phone]

## 2014-10-01 ENCOUNTER — Encounter: Payer: Self-pay | Admitting: *Deleted

## 2014-10-01 ENCOUNTER — Ambulatory Visit: Payer: Medicare Other | Attending: Family Medicine | Admitting: *Deleted

## 2014-10-01 DIAGNOSIS — M25611 Stiffness of right shoulder, not elsewhere classified: Secondary | ICD-10-CM | POA: Diagnosis not present

## 2014-10-01 DIAGNOSIS — M25511 Pain in right shoulder: Secondary | ICD-10-CM | POA: Diagnosis not present

## 2014-10-01 NOTE — Therapy (Signed)
[facility] [place], [place], [address] Phone: [phone]   Fax:  [phone]  Physical Therapy Treatment  Patient Details  Name: Michael [name] MRN: [id number] Date of Birth: [birth year] Referring Provider:  Curlene Labrum, MD  Encounter Date: 10/01/2014      PT End of Session - 10/01/14 1400    Visit Number 3   Number of Visits 12   Date for PT Re-Evaluation 11/19/14   PT Start Time 3267   PT Stop Time 1438   PT Time Calculation (min) 53 min      Past Medical History  Diagnosis Date  . Hyperthyroidism 1978    Status post radioactive iodine treatment  . History of herpes zoster 2001  . Vitreous hemorrhage MAY 2009     Right eye  . Peripheral arterial disease   . Mixed hyperlipidemia   . Essential hypertension, benign   . Allergic rhinitis   . COPD (chronic obstructive pulmonary disease)   . History of asbestos exposure   . Depression   . Anemia   . Carotid artery occlusion   . History of myocardial infarction   . Coronary atherosclerosis of native coronary artery     BMS RCA 2003, DES RCA 2004, DES RCA 2005   . GERD (gastroesophageal reflux disease)   . Hemorrhoids   . Head injury, closed, with brief LOC   . HOH (hard of hearing)   . Hypothyroidism   . Lumbar pain   . Psoriasis   . Prostate cancer     Status post XRT/IMRT 2008 - Dr. Alinda Money  . Renal cancer     Cryoablation in 2012  . Pneumonia     November 2014    Past Surgical History  Procedure Laterality Date  . Popliteal synovial cyst excision      knee  . Cervical disc surgery  APRIL 2004    fusion  . Partial knee arthroplasty  JULY 2005    Right-replacement  . Nasal septum surgery      SMR  . Neck disk fusion  07/2002  . Knee arthroplasty      bilateral  . Endarterectomy  08/30/2011    Procedure: Left ENDARTERECTOMY CAROTID;  Surgeon: Conrad Farmington, MD;  Location: [place];  Service: Vascular;  Laterality: Left;  . Joint replacement  Right 2005    Right partial Knee  . Joint replacement Left 2011    Left toatal Knee  . Endarterectomy Right 12/18/2012    Procedure: ENDARTERECTOMY CAROTID WITH BOVINE PATCH ANGIOPLASTY;  Surgeon: Conrad Sherrelwood, MD;  Location: [place];  Service: Vascular;  Laterality: Right;  . Carotid endarterectomy    . Cholecystectomy  1995  . Cardiac catheterization      AUG 2003, AUG 2004, OCT 2005 9 HAS 3 STENTS DONE AT 3 DIFFERENT CATHS - ONE IS DRUG ELUTING STENT; CATH 2006, 2007, 2009 2010  . Eye surgery  2010    bilat cataract.2009-Vitreous Hemorrhage-2009  . Total knee revision Right 08/06/2013    Procedure: REVISION RIGHT TOTAL KNEE ARTHROPLASTY  ;  Surgeon: Gearlean Alf, MD;  Location: [facility];  Service: Orthopedics;  Laterality: Right;    There were no vitals filed for this visit.  Visit Diagnosis:  Right shoulder pain  Shoulder stiffness, right      Subjective Assessment - 10/01/14 1401    Subjective Mowed two yards today. RT shldr pain 4/10   Patient Stated Goals Been able to use my  right shoulder with less pain.   Currently in Pain? Yes   Pain Score 4    Pain Location Shoulder   Pain Orientation Right   Pain Descriptors / Indicators Aching   Aggravating Factors  overhead acts   Pain Relieving Factors rest            [facility] PT Assessment - 10/01/14 0001    AROM   Right/Left Shoulder Right   Right Shoulder Flexion 145 Degrees   Right Shoulder Internal Rotation 75 Degrees   Right Shoulder External Rotation 45 Degrees                     [facility] Adult PT Treatment/Exercise - 10/01/14 0001    Exercises   Exercises Shoulder   Shoulder Exercises: Standing   Other Standing Exercises UE ranger flexion, circles each way   Shoulder Exercises: Pulleys   Flexion 3 minutes   Shoulder Exercises: ROM/Strengthening   UBE (Upper Arm Bike)  6 minsat 120 RPMS   Modalities   Modalities Electrical Stimulation;Moist Heat;Ultrasound   Moist Heat Therapy   Number Minutes  Moist Heat 15 Minutes   Moist Heat Location Shoulder   Electrical Stimulation   Electrical Stimulation Location RT   Electrical Stimulation Action premod   Electrical Stimulation Parameters 80-'150hz'$  x15 mins   Electrical Stimulation Goals Pain   Manual Therapy   Manual Therapy Passive ROM   Passive ROM PROM to RT shldr with pt supine. Flexion and ER/IR with low load end-range holds Scapula and GHJ mobs                PT Education - 10/01/14 1430    Education provided Yes   Education Details Pulleys for home   Person(s) Educated Patient   Methods Explanation;Demonstration   Comprehension Verbalized understanding;Returned demonstration          PT Short Term Goals - 10/01/14 1429    PT SHORT TERM GOAL #1   Title Independent with HEP.   Time 2   Period Weeks   Status Achieved           PT Long Term Goals - 09/24/14 1110    PT LONG TERM GOAL #1   Title Active right shoulder flexion to 145 degrees so the patient can easily reach overhead   Time 6   Period Weeks   Status New   PT LONG TERM GOAL #2   Title Active ER to 60 degrees+ to allow for easily donning/doffing of apparel   Time 6   Period Weeks   Status New   PT LONG TERM GOAL #3   Title Increase ROM so patient is able to reach behind back to L3 with right index finger.   Time 6   Period Weeks   Status New   PT LONG TERM GOAL #4   Title Increase right shoulder strength to 4+/5 so he can more easily complete functional activities that require anti-gravity movement.   Time 6   Period Weeks   Status New               Plan - 10/01/14 1425    Clinical Impression Statement Pt did good with Rx even thow he was sore. His ROM for RT shldr has improved and ranges have a better end-feel. Current goals are on-going   Pt will benefit from skilled therapeutic intervention in order to improve on the following deficits Pain;Decreased range of motion;Decreased strength   Rehab Potential Good  PT Frequency  2x / week   PT Duration 6 weeks   PT Treatment/Interventions ADLs/Self Care Home Management;Electrical Stimulation;Cryotherapy;Moist Heat;Ultrasound;Therapeutic exercise;Therapeutic activities;Manual techniques;Passive range of motion   PT Next Visit Plan Right shoulder ROM; UBE; pulleys; UE Ranger; capsular stretching; strengthening are ROM improves.[place]        Problem List Patient Active Problem List   Diagnosis Date Noted  . Acute chest pain 09/06/2014  . Chest pain at rest   . Essential hypertension   . Dyslipidemia   . Other specified hypothyroidism   . Carotid stenosis 02/20/2014  . Chest pain 02/15/2014  . Postoperative anemia due to acute blood loss 08/07/2013  . Pain due to unicompartmental arthroplasty of knee 08/06/2013  . Preoperative cardiovascular examination 06/18/2013  . Carotid stenosis, bilateral 08/04/2011  . Carotid artery obstruction 02/03/2011  . TOBACCO USER 10/08/2009  . Clinical depression 02/25/2009  . Mixed hyperlipidemia 12/22/2008  . Essential hypertension, benign 12/22/2008  . CAD, NATIVE VESSEL 12/22/2008  . PVD 12/22/2008  . Essential (primary) hypertension 12/22/2008  . HLD (hyperlipidemia) 12/22/2008  . Peripheral blood vessel disorder 12/22/2008  . COPD with emphysema 11/29/2007  . ASBESTOS EXPOSURE, HX OF 11/29/2007  . CAFL (chronic airflow limitation) 11/29/2007    RAMSEUR,CHRIS, PTA 10/01/2014, 2:34 PM  [facility] [address] [place], [place], [address] Phone: [phone]   Fax:  [phone]

## 2014-10-02 ENCOUNTER — Telehealth: Payer: Self-pay | Admitting: Cardiology

## 2014-10-02 ENCOUNTER — Encounter: Payer: Self-pay | Admitting: Cardiology

## 2014-10-02 ENCOUNTER — Encounter: Payer: Self-pay | Admitting: *Deleted

## 2014-10-02 ENCOUNTER — Ambulatory Visit (INDEPENDENT_AMBULATORY_CARE_PROVIDER_SITE_OTHER): Payer: Medicare Other | Admitting: Cardiology

## 2014-10-02 VITALS — BP 124/70 | HR 89 | Ht 67.0 in | Wt 153.0 lb

## 2014-10-02 DIAGNOSIS — I10 Essential (primary) hypertension: Secondary | ICD-10-CM | POA: Diagnosis not present

## 2014-10-02 DIAGNOSIS — I25119 Atherosclerotic heart disease of native coronary artery with unspecified angina pectoris: Secondary | ICD-10-CM

## 2014-10-02 DIAGNOSIS — E782 Mixed hyperlipidemia: Secondary | ICD-10-CM

## 2014-10-02 DIAGNOSIS — R072 Precordial pain: Secondary | ICD-10-CM | POA: Diagnosis not present

## 2014-10-02 NOTE — Patient Instructions (Signed)
Your physician recommends that you continue on your current medications as directed. Please refer to the Current Medication list given to you today. Your physician has requested that you have a lexiscan myoview. For further information please visit [email or web address]. Please follow instruction sheet, as given. Your physician recommends that you schedule a follow-up appointment in: 6 months. You will receive a reminder letter in the mail in about 4 months reminding you to call and schedule your appointment. If you don't receive this letter, please contact our office. 

## 2014-10-02 NOTE — Telephone Encounter (Signed)
Lexiscan Cardiolite on medications dx: precordial pain & CAD  Scheduled at [facility] on June 9th register at 9:30

## 2014-10-02 NOTE — Progress Notes (Signed)
Cardiology Office Note  Date: 10/02/2014   ID: BOEN STERBENZ, DOB [birth year], MRN [id number]  PCP: Curlene Labrum, MD  Primary Cardiologist: Rozann Lesches, MD   Chief Complaint  Patient presents with  . Coronary Artery Disease    History of Present Illness: Deyonte L Vanwagoner is a medically complex 81 y.o. male last seen in December 2015. Interval records reviewed finding hospitalization in May at [facility] with chest pain. It was felt that his symptoms were likely musculoskeletal based on the discharge summary, he ruled out for ACS and was otherwise managed medically, seen by Ms. Lawrence NP (I reviewed her consult note). No additional cardiac studies were arranged.  He is here today with his son, states that he has had no further chest pain following hospital discharge. He did some mowing out in the yard yesterday without symptoms. He has had at least 3 distinct episodes of prolonged chest pressure in the last 6 months. No clear evidence of ACS when he has been evaluated hospital. He reports compliance with his medications, outlined below.  Most recent ischemic testing from last year is reviewed below, low risk.  I reviewed his most recent ECG.   Past Medical History  Diagnosis Date  . Hyperthyroidism 1978    Status post radioactive iodine treatment  . History of herpes zoster 2001  . Vitreous hemorrhage MAY 2009     Right eye  . Peripheral arterial disease   . Mixed hyperlipidemia   . Essential hypertension, benign   . Allergic rhinitis   . COPD (chronic obstructive pulmonary disease)   . History of asbestos exposure   . Depression   . Anemia   . Carotid artery occlusion   . History of myocardial infarction   . Coronary atherosclerosis of native coronary artery     BMS RCA 2003, DES RCA 2004, DES RCA 2005   . GERD (gastroesophageal reflux disease)   . Hemorrhoids   . Head injury, closed, with brief LOC   . HOH (hard of hearing)   . Hypothyroidism   . Lumbar  pain   . Psoriasis   . Prostate cancer     Status post XRT/IMRT 2008 - Dr. Alinda Money  . Renal cancer     Cryoablation in 2012  . Pneumonia     November 2014    Past Surgical History  Procedure Laterality Date  . Popliteal synovial cyst excision      knee  . Cervical disc surgery  APRIL 2004    fusion  . Partial knee arthroplasty  JULY 2005    Right-replacement  . Nasal septum surgery      SMR  . Neck disk fusion  07/2002  . Knee arthroplasty      bilateral  . Endarterectomy  08/30/2011    Procedure: Left ENDARTERECTOMY CAROTID;  Surgeon: Conrad [place], MD;  Location: [place];  Service: Vascular;  Laterality: Left;  . Joint replacement Right 2005    Right partial Knee  . Joint replacement Left 2011    Left toatal Knee  . Endarterectomy Right 12/18/2012    Procedure: ENDARTERECTOMY CAROTID WITH BOVINE PATCH ANGIOPLASTY;  Surgeon: Conrad Berthoud, MD;  Location: [place];  Service: Vascular;  Laterality: Right;  . Carotid endarterectomy    . Cholecystectomy  1995  . Cardiac catheterization      AUG 2003, AUG 2004, OCT 2005 9 HAS 3 STENTS DONE AT 3 DIFFERENT CATHS - ONE IS DRUG ELUTING STENT; CATH 2006, 2007,  2009 2010  . Eye surgery  2010    bilat cataract.2009-Vitreous Hemorrhage-2009  . Total knee revision Right 08/06/2013    Procedure: REVISION RIGHT TOTAL KNEE ARTHROPLASTY  ;  Surgeon: Gearlean Alf, MD;  Location: [facility];  Service: Orthopedics;  Laterality: Right;    Current Outpatient Prescriptions  Medication Sig Dispense Refill  . acetaminophen (TYLENOL) 650 MG CR tablet Take 1,300 mg by mouth every 8 (eight) hours as needed for pain.    Marland Kitchen albuterol (PROAIR HFA) 108 (90 BASE) MCG/ACT inhaler Inhale 2 puffs into the lungs every 6 (six) hours as needed. For shortness of breath    . amLODipine (NORVASC) 10 MG tablet Take 10 mg by mouth every morning.     Marland Kitchen aspirin 81 MG tablet Take 81 mg by mouth every morning.     Marland Kitchen atorvastatin (LIPITOR) 40 MG tablet Take 40 mg by mouth at bedtime.      . cetirizine (ZYRTEC) 10 MG tablet Take 10 mg by mouth every morning.     . citalopram (CELEXA) 20 MG tablet Take 20 mg by mouth every morning.     . clobetasol cream (TEMOVATE) 7.51 % Apply 1 application topically daily. Applied to legs for psoriasis    . ferrous sulfate 325 (65 FE) MG tablet Take 325 mg by mouth at bedtime.     . Fluticasone-Salmeterol (ADVAIR DISKUS) 250-50 MCG/DOSE AEPB Inhale 1 puff into the lungs every 12 (twelve) hours.      . isosorbide mononitrate (IMDUR) 60 MG 24 hr tablet Take 1 tablet (60 mg total) by mouth 2 (two) times daily. 180 tablet 3  . levothyroxine (SYNTHROID, LEVOTHROID) 125 MCG tablet Take 125 mcg by mouth every morning.     . Liniments (SALONPAS ARTHRITIS PAIN RELIEF) PADS Apply 1 each topically as needed (Shoulder Pain).    . mometasone (NASONEX) 50 MCG/ACT nasal spray Place 2 sprays into the nose at bedtime.    . Multiple Vitamin (MULTIVITAMIN) tablet Take 1 tablet by mouth every morning.     . nitroGLYCERIN (NITROSTAT) 0.4 MG SL tablet Place 1 tablet (0.4 mg total) under the tongue every 5 (five) minutes as needed. For chest pain 25 tablet 3  . Omega 3 1200 MG CAPS Take 2 capsules by mouth 2 (two) times daily. Take 2 capsules in AM and 2 capsules in  PM    . polyethylene glycol (MIRALAX / GLYCOLAX) packet Take 17 g by mouth daily as needed for mild constipation. 14 each 0  . ranitidine (ZANTAC) 150 MG tablet Take 150 mg by mouth every morning.     . theophylline (THEODUR) 100 MG 12 hr tablet Take 1 tablet (100 mg total) by mouth 2 (two) times daily. 180 tablet 3  . tiotropium (SPIRIVA) 18 MCG inhalation capsule Place 1 capsule (18 mcg total) into inhaler and inhale daily. 30 capsule 0  . traZODone (DESYREL) 100 MG tablet Take 100 mg by mouth at bedtime as needed. For sleep     No current facility-administered medications for this visit.    Allergies:  Ciprofloxacin; Iron; Lyrica; Morphine and related; Oxycodone; Penicillins; and  Hydrocodone-acetaminophen   Social History: The patient  reports that he has been smoking Cigarettes.  He started smoking about 71 years ago. He has a 15 pack-year smoking history. He has never used smokeless tobacco. He reports that he does not drink alcohol or use illicit drugs.    ROS:  Please see the history of present illness. Otherwise, complete review  of systems is positive for right shoulder arthritis pain, currently getting physical therapy.  All other systems are reviewed and negative.   Physical Exam: VS:  BP 124/70 mmHg  Pulse 89  Ht '5\' 7"'$  (1.702 m)  Wt 153 lb (69.4 kg)  BMI 23.96 kg/m2  SpO2 92%, BMI Body mass index is 23.96 kg/(m^2).  Wt Readings from Last 3 Encounters:  10/02/14 153 lb (69.4 kg)  09/06/14 155 lb 4 oz (70.421 kg)  07/04/14 150 lb 12.7 oz (68.4 kg)     General: Patient appears comfortable at rest. HEENT: Conjunctiva and lids normal, oropharynx clear with moist mucosa. Neck: Supple, no elevated JVP or carotid bruits, no thyromegaly. Lungs: Clear to auscultation, nonlabored breathing at rest. Cardiac: Regular rate and rhythm, no S3 or significant systolic murmur, no pericardial rub. Abdomen: Soft, nontender, no hepatomegaly, bowel sounds present, no guarding or rebound. Extremities: No pitting edema, distal pulses 2+. Skin: Warm and dry. Musculoskeletal: No kyphosis. Neuropsychiatric: Alert and oriented x3, affect grossly appropriate.   ECG: Tracing from 09/06/2014 showed sinus rhythm with prolonged PR interval and leftward axis.   Recent Labwork: 02/15/2014: Pro B Natriuretic peptide (BNP) 98.4 09/06/2014: ALT 15*; AST 24; BUN 13; Creatinine 1.17; Hemoglobin 13.6; Platelets 263; Potassium 3.9; Sodium 135     Component Value Date/Time   CHOL 122 02/16/2014 0124   TRIG 74 02/16/2014 0124   HDL 67 02/16/2014 0124   CHOLHDL 1.8 02/16/2014 0124   VLDL 15 02/16/2014 0124   LDLCALC 40 02/16/2014 0124    Other Studies Reviewed Today:  Lexiscan  Cardiolite 06/27/2013: FINDINGS: The patient was stressed according to the Lexiscan protocol. The heart rate ranged between 59 to 90 beats per min. The blood pressure ranged between 110/61 to 128/62. No chest pain was reported.  The baseline ECG showed normal sinus rhythm. With stress, there were no diagnostic ST segment or T-wave abnormalities, nor any arrhythmias.  Analysis of the raw data showed no extracardiac radiotracer uptake. Analysis of the perfusion images showed normal homogeneous uptake of the radiotracer in wall wall segments on both stress and rest images. Left ventricular systolic function was normal with normal regional wall motion. Calculated LV EF was 64%.  IMPRESSION: 1. Normal Lexiscan Cardiolite stress test.  2. No evidence of myocardial ischemia or scar. No chest pain was reported.  3. Normal left ventricular systolic function, calculated LVEF 64%.   Assessment and Plan:  1. Recurrent chest pain symptoms in the setting of known CAD. Recent hospital stay reviewed without evidence of ACS or acute ST segment changes. He has had 3 prolonged episodes of chest discomfort in the last 6 months, we will continue medical therapy and follow-up with a Lexiscan Cardiolite to ensure no significant change in ischemic burden. Testing from a year and a half ago was low risk.  2. CAD status post percutaneous interventions to the RCA, most recently DES in 2005.  3. Essential hypertension, blood pressure is normal.  4. Hyperlipidemia with aggressive LDL control on statin therapy.  Current medicines were reviewed with the patient today.   Orders Placed This Encounter  Procedures  . NM Myocar Multi W/Spect W/Wall Motion / EF    Disposition: FU with me in 6 months.   Signed, Satira Sark, MD, [facility] 10/02/2014 8:31 AM    [facility], [place], [place] [address] Phone: [phone]; Fax: [phone]

## 2014-10-02 NOTE — Telephone Encounter (Signed)
Pt has MCR and [employer].  No precert required.

## 2014-10-04 ENCOUNTER — Other Ambulatory Visit: Payer: Self-pay | Admitting: Cardiology

## 2014-10-04 ENCOUNTER — Other Ambulatory Visit: Payer: Self-pay | Admitting: Internal Medicine

## 2014-10-06 ENCOUNTER — Encounter: Payer: Self-pay | Admitting: Physical Therapy

## 2014-10-06 ENCOUNTER — Telehealth: Payer: Self-pay | Admitting: Internal Medicine

## 2014-10-06 ENCOUNTER — Ambulatory Visit: Payer: Medicare Other | Admitting: Physical Therapy

## 2014-10-06 DIAGNOSIS — M25611 Stiffness of right shoulder, not elsewhere classified: Secondary | ICD-10-CM | POA: Diagnosis not present

## 2014-10-06 DIAGNOSIS — M25511 Pain in right shoulder: Secondary | ICD-10-CM | POA: Diagnosis not present

## 2014-10-06 MED ORDER — THEOPHYLLINE ER 100 MG PO TB12: 100.0000 mg | ORAL_TABLET | Freq: Two times a day (BID) | ORAL | Status: DC

## 2014-10-06 NOTE — Patient Instructions (Signed)
ROM: External / Internal Rotation - Wand   Holding wand with left hand palm up, push out from body with other hand, palm down. Keep both elbows bent. When stretch is felt, hold _5___ seconds. Repeat to other side, leading with same hand. Keep elbows bent. Repeat __10__ times per set. Do __2-3__ sets per session. Do __2__ sessions per day.  http://orth.exer.us/748   Copyright  VHI. All rights reserved.  ROM: Flexion - Wand (Supine)   Lie on back holding wand. Raise arms over head.  Repeat __10__ times per set. Do _2-3___ sets per session. Do _2___ sessions per day.  http://orth.exer.us/928   Copyright  VHI. All rights reserved.   

## 2014-10-06 NOTE — Telephone Encounter (Signed)
Patients daughter called and said that pharmacy ([facility]) says that Theophylline.  [facility], [place]... [phone]  Refill sent to [facility] per request.  Nothing further needed.

## 2014-10-06 NOTE — Therapy (Signed)
[facility] [place], [place], [address] Phone: [phone]   Fax:  [phone]  Physical Therapy Treatment  Patient Details  Name: Michael [name] MRN: [id number] Date of Birth: 08-28-1931 Referring Provider:  Curlene Labrum, MD  Encounter Date: 10/06/2014      PT End of Session - 10/06/14 1114    Visit Number 4   Number of Visits 12   Date for PT Re-Evaluation 11/19/14   PT Start Time 1028   PT Stop Time 1127   PT Time Calculation (min) 59 min   Activity Tolerance Patient tolerated treatment well   Behavior During Therapy [facility] for tasks assessed/performed      Past Medical History  Diagnosis Date  . Hyperthyroidism 1978    Status post radioactive iodine treatment  . History of herpes zoster 2001  . Vitreous hemorrhage MAY 2009     Right eye  . Peripheral arterial disease   . Mixed hyperlipidemia   . Essential hypertension, benign   . Allergic rhinitis   . COPD (chronic obstructive pulmonary disease)   . History of asbestos exposure   . Depression   . Anemia   . Carotid artery occlusion   . History of myocardial infarction   . Coronary atherosclerosis of native coronary artery     BMS RCA 2003, DES RCA 2004, DES RCA 2005   . GERD (gastroesophageal reflux disease)   . Hemorrhoids   . Head injury, closed, with brief LOC   . HOH (hard of hearing)   . Hypothyroidism   . Lumbar pain   . Psoriasis   . Prostate cancer     Status post XRT/IMRT 2008 - Dr. Alinda Money  . Renal cancer     Cryoablation in 2012  . Pneumonia     November 2014    Past Surgical History  Procedure Laterality Date  . Popliteal synovial cyst excision      knee  . Cervical disc surgery  APRIL 2004    fusion  . Partial knee arthroplasty  JULY 2005    Right-replacement  . Nasal septum surgery      SMR  . Neck disk fusion  07/2002  . Knee arthroplasty      bilateral  . Endarterectomy  08/30/2011    Procedure: Left ENDARTERECTOMY  CAROTID;  Surgeon: Conrad Houck, MD;  Location: [place];  Service: Vascular;  Laterality: Left;  . Joint replacement Right 2005    Right partial Knee  . Joint replacement Left 2011    Left toatal Knee  . Endarterectomy Right 12/18/2012    Procedure: ENDARTERECTOMY CAROTID WITH BOVINE PATCH ANGIOPLASTY;  Surgeon: Conrad Deseret, MD;  Location: [place];  Service: Vascular;  Laterality: Right;  . Carotid endarterectomy    . Cholecystectomy  1995  . Cardiac catheterization      AUG 2003, AUG 2004, OCT 2005 9 HAS 3 STENTS DONE AT 3 DIFFERENT CATHS - ONE IS DRUG ELUTING STENT; CATH 2006, 2007, 2009 2010  . Eye surgery  2010    bilat cataract.2009-Vitreous Hemorrhage-2009  . Total knee revision Right 08/06/2013    Procedure: REVISION RIGHT TOTAL KNEE ARTHROPLASTY  ;  Surgeon: Gearlean Alf, MD;  [facility];  Service: Orthopedics;  Laterality: Right;    There were no vitals filed for this visit.  Visit Diagnosis:  Right shoulder pain  Shoulder stiffness, right      Subjective Assessment - 10/06/14 1038    Subjective very  sore in shoulder   Patient Stated Goals Been able to use my right shoulder with less pain.   Currently in Pain? Yes   Pain Score 7    Pain Location Shoulder   Pain Orientation Right   Pain Descriptors / Indicators Aching;Sore   Pain Type Chronic pain   Pain Onset More than a month ago   Aggravating Factors  reaching overhead   Pain Relieving Factors rest            [facility] PT Assessment - 10/06/14 0001    AROM   Overall AROM  Deficits   AROM Assessment Site Shoulder   Right/Left Shoulder Right   Right Shoulder Flexion 110 Degrees   Right Shoulder External Rotation 35 Degrees   PROM   Overall PROM  Deficits   PROM Assessment Site Shoulder   Right/Left Shoulder Right   Right Shoulder Flexion 138 Degrees   Right Shoulder External Rotation 50 Degrees                     [facility] Adult PT Treatment/Exercise - 10/06/14 0001    Shoulder Exercises:  Supine   Other Supine Exercises cane flexion and ER 2x10 each   Shoulder Exercises: Pulleys   Flexion --  42mn   Shoulder Exercises: ROM/Strengthening   UBE (Upper Arm Bike)  8 mins 120 RPMS   Ultrasound   Ultrasound Location right inferior shoulder   Ultrasound Parameters 1.5w/cm2/50%/3.383m x8m25m  Ultrasound Goals Pain   Manual Therapy   Manual Therapy Passive ROM   Passive ROM PROM to RT shldr with pt supine. Flexion and ER/IR with low load end-range holds Scapula and GHJ mobs                PT Education - 10/06/14 1108    Education provided Yes   Education Details HEP cane for flexion/er   Person(s) Educated Patient   Methods Explanation;Demonstration;Handout   Comprehension Verbalized understanding;Returned demonstration          PT Short Term Goals - 10/01/14 1429    PT SHORT TERM GOAL #1   Title Independent with HEP.   Time 2   Period Weeks   Status Achieved           PT Long Term Goals - 09/24/14 1110    PT LONG TERM GOAL #1   Title Active right shoulder flexion to 145 degrees so the patient can easily reach overhead   Time 6   Period Weeks   Status New   PT LONG TERM GOAL #2   Title Active ER to 60 degrees+ to allow for easily donning/doffing of apparel   Time 6   Period Weeks   Status New   PT LONG TERM GOAL #3   Title Increase ROM so patient is able to reach behind back to L3 with right index finger.   Time 6   Period Weeks   Status New   PT LONG TERM GOAL #4   Title Increase right shoulder strength to 4+/5 so he can more easily complete functional activities that require anti-gravity movement.   Time 6   Period Weeks   Status New               Plan - 10/06/14 1116    Clinical Impression Statement Patient progresssing slowly with ROM and has a lot of pain with ROM in right shoulder. HEP given for cane flexion and ER to improve range and functional indepence. goals  ongoing due to pain, strength and ROM deficits.   Pt will  benefit from skilled therapeutic intervention in order to improve on the following deficits Pain;Decreased range of motion;Decreased strength   Rehab Potential Good   PT Frequency 2x / week   PT Duration 6 weeks   PT Treatment/Interventions ADLs/Self Care Home Management;Electrical Stimulation;Cryotherapy;Moist Heat;Ultrasound;Therapeutic exercise;Therapeutic activities;Manual techniques;Passive range of motion   PT Next Visit Plan Right shoulder ROM; UBE; pulleys; UE Ranger; capsular stretching; strengthening are ROM improves.[place]   Consulted and Agree with Plan of Care Patient        Problem List Patient Active Problem List   Diagnosis Date Noted  . Acute chest pain 09/06/2014  . Chest pain at rest   . Essential hypertension   . Dyslipidemia   . Other specified hypothyroidism   . Carotid stenosis 02/20/2014  . Chest pain 02/15/2014  . Postoperative anemia due to acute blood loss 08/07/2013  . Pain due to unicompartmental arthroplasty of knee 08/06/2013  . Preoperative cardiovascular examination 06/18/2013  . Carotid stenosis, bilateral 08/04/2011  . Carotid artery obstruction 02/03/2011  . TOBACCO USER 10/08/2009  . Clinical depression 02/25/2009  . Mixed hyperlipidemia 12/22/2008  . Essential hypertension, benign 12/22/2008  . CAD, NATIVE VESSEL 12/22/2008  . PVD 12/22/2008  . Essential (primary) hypertension 12/22/2008  . HLD (hyperlipidemia) 12/22/2008  . Peripheral blood vessel disorder 12/22/2008  . COPD with emphysema 11/29/2007  . ASBESTOS EXPOSURE, HX OF 11/29/2007  . CAFL (chronic airflow limitation) 11/29/2007    Jazzma Neidhardt P, PTA 10/06/2014, 11:27 AM  [facility] [address] [place], [place], [address] Phone: [phone]   Fax:  [phone]

## 2014-10-07 DIAGNOSIS — I708 Atherosclerosis of other arteries: Secondary | ICD-10-CM | POA: Diagnosis not present

## 2014-10-07 DIAGNOSIS — M7551 Bursitis of right shoulder: Secondary | ICD-10-CM | POA: Diagnosis not present

## 2014-10-07 DIAGNOSIS — D075 Carcinoma in situ of prostate: Secondary | ICD-10-CM | POA: Diagnosis not present

## 2014-10-07 DIAGNOSIS — F17210 Nicotine dependence, cigarettes, uncomplicated: Secondary | ICD-10-CM | POA: Diagnosis not present

## 2014-10-07 DIAGNOSIS — N183 Chronic kidney disease, stage 3 (moderate): Secondary | ICD-10-CM | POA: Diagnosis not present

## 2014-10-07 DIAGNOSIS — E039 Hypothyroidism, unspecified: Secondary | ICD-10-CM | POA: Diagnosis not present

## 2014-10-07 DIAGNOSIS — R079 Chest pain, unspecified: Secondary | ICD-10-CM | POA: Diagnosis not present

## 2014-10-07 DIAGNOSIS — M7501 Adhesive capsulitis of right shoulder: Secondary | ICD-10-CM | POA: Diagnosis not present

## 2014-10-07 DIAGNOSIS — E782 Mixed hyperlipidemia: Secondary | ICD-10-CM | POA: Diagnosis not present

## 2014-10-07 DIAGNOSIS — J439 Emphysema, unspecified: Secondary | ICD-10-CM | POA: Diagnosis not present

## 2014-10-08 ENCOUNTER — Inpatient Hospital Stay (HOSPITAL_COMMUNITY): Admission: RE | Admit: 2014-10-08 | Payer: Self-pay | Source: Ambulatory Visit

## 2014-10-08 ENCOUNTER — Encounter (HOSPITAL_COMMUNITY)
Admission: RE | Admit: 2014-10-08 | Discharge: 2014-10-08 | Disposition: A | Discharge status: Home / Self Care | Payer: Medicare Other | Source: Ambulatory Visit | Attending: Cardiology | Admitting: Cardiology

## 2014-10-08 ENCOUNTER — Telehealth: Payer: Self-pay | Admitting: *Deleted

## 2014-10-08 ENCOUNTER — Encounter (HOSPITAL_COMMUNITY): Payer: Self-pay

## 2014-10-08 DIAGNOSIS — I25119 Atherosclerotic heart disease of native coronary artery with unspecified angina pectoris: Secondary | ICD-10-CM

## 2014-10-08 DIAGNOSIS — R072 Precordial pain: Secondary | ICD-10-CM | POA: Diagnosis not present

## 2014-10-08 LAB — NM MYOCAR MULTI W/SPECT W/WALL MOTION / EF
CHL CUP NUCLEAR SRS: 3
CHL CUP NUCLEAR SSS: 4
CSEPPHR: 96 {beats}/min
LV dias vol: 87 mL
LV sys vol: 24 mL
RATE: 0.35
Rest HR: 60 {beats}/min
SDS: 1
TID: 1.2

## 2014-10-08 MED ORDER — TECHNETIUM TC 99M SESTAMIBI GENERIC - CARDIOLITE
10.0000 | Freq: Once | INTRAVENOUS | Status: AC | PRN
  Administered 2014-10-08: 10.8 via INTRAVENOUS

## 2014-10-08 MED ORDER — SODIUM CHLORIDE 0.9 % IJ SOLN
10.0000 mL | INTRAMUSCULAR | Status: DC | PRN
  Administered 2014-10-08: 10 mL via INTRAVENOUS
  Filled 2014-10-08: qty 10

## 2014-10-08 MED ORDER — TECHNETIUM TC 99M SESTAMIBI - CARDIOLITE
30.0000 | Freq: Once | INTRAVENOUS | Status: AC | PRN
  Administered 2014-10-08: 12:00:00 30.2 via INTRAVENOUS

## 2014-10-08 MED ORDER — SODIUM CHLORIDE 0.9 % IJ SOLN
INTRAMUSCULAR | Status: AC
Start: 2014-10-08 — End: 2014-10-08
  Administered 2014-10-08: 10 mL via INTRAVENOUS
  Filled 2014-10-08: qty 3

## 2014-10-08 MED ORDER — REGADENOSON 0.4 MG/5ML IV SOLN
0.4000 mg | Freq: Once | INTRAVENOUS | Status: AC | PRN
  Filled 2014-10-08: qty 5

## 2014-10-08 MED ORDER — REGADENOSON 0.4 MG/5ML IV SOLN
0.4000 mg | Freq: Once | INTRAVENOUS | Status: AC | PRN
  Administered 2014-10-08: 12:00:00 via INTRAVENOUS
  Filled 2014-10-08: qty 5

## 2014-10-08 MED ORDER — REGADENOSON 0.4 MG/5ML IV SOLN
INTRAVENOUS | Status: AC
  Filled 2014-10-08: qty 5

## 2014-10-08 MED ORDER — SODIUM CHLORIDE 0.9 % IJ SOLN: 10.0000 mL | INTRAMUSCULAR | Status: DC | PRN

## 2014-10-08 NOTE — Telephone Encounter (Signed)
-----   Message from Satira Sark, MD sent at 10/08/2014  3:05 PM EDT ----- Reviewed report. Please let him know that his stress test was low risk, no definite evidence of progression in CAD.

## 2014-10-08 NOTE — Telephone Encounter (Signed)
Patient informed. 

## 2014-10-09 ENCOUNTER — Ambulatory Visit: Payer: Self-pay | Admitting: Physical Therapy

## 2014-10-29 ENCOUNTER — Encounter: Payer: Self-pay | Admitting: Internal Medicine

## 2014-10-29 ENCOUNTER — Ambulatory Visit (INDEPENDENT_AMBULATORY_CARE_PROVIDER_SITE_OTHER): Payer: Worker's Compensation | Admitting: Internal Medicine

## 2014-10-29 VITALS — BP 132/72 | HR 93 | Ht 67.0 in | Wt 154.2 lb

## 2014-10-29 DIAGNOSIS — Z77090 Contact with and (suspected) exposure to asbestos: Secondary | ICD-10-CM

## 2014-10-29 DIAGNOSIS — J432 Centrilobular emphysema: Secondary | ICD-10-CM

## 2014-10-29 DIAGNOSIS — Z720 Tobacco use: Secondary | ICD-10-CM | POA: Diagnosis not present

## 2014-10-29 DIAGNOSIS — F17200 Nicotine dependence, unspecified, uncomplicated: Secondary | ICD-10-CM

## 2014-10-29 MED ORDER — DOXYCYCLINE HYCLATE 100 MG PO TABS
ORAL_TABLET | ORAL | Status: DC
Start: 2014-10-29 — End: 2015-06-10

## 2014-10-29 NOTE — Patient Instructions (Signed)
Try using the current supply of Spriva a week, then skip a week. You can repeat that cycle for awhile to see if it makes a real difference. If not we will stop it. If it really seems to be helping him, then we can change to a similar product that might be easier to use and to remember.  Ok to stop theophylline  Please call as needed

## 2014-11-02 NOTE — Assessment & Plan Note (Signed)
Most recent chest x-ray again does not show significant change consistent with asbestos-related disease. Known plaques her visible on CT but there has been no apparent progression. Multiply risk of asbestos exposure with ongoing tobacco exposure has been explained to him repeatedly.

## 2014-11-02 NOTE — Progress Notes (Signed)
Patient ID: Michael [name], male    DOB: 1932-03-04, 81 y.o.   MRN: [id number] PCP Dr Michael [name] HPI 08/14/00- 46 yo M current smoker, coming for f/u of COPD and allergic rhinitis. Had pneumonia about a month ago and brings CXR from 07/18/10 done by [name], showing COPD but NAD on my screen. . Still smokes 1 Pack/ 3days.  Admits daily morning cough, usually clear or slight yellow sputum but no blood. Admits some SOB, but exercises 3 days/ week at rehab center. Wheezes some. Continues Advair, Spiriva. Proair doesn't help a lot. Had cardiac evaluation/ stress test by cardiologist- "ok".  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 He brings a newsletter from [employer] and from [place], and from them he asks about CT to assess for asbestos related changes.   09/23/10 Smoker with hx asbestos exposure, COPD, allergic rhinitis. Newly dx'd renal cancer- to see Dr Michael [name]. Daughter here He notes a little cough occasionallly , not much. Denies chest pain, palpitation.He has not really tried to stop smoking. I reviewed his CT abd which included some lung level views and shared the images with them today. No lung abnormality described, but there is significant atherosclerosis.   02/03/11- Smoker with hx asbestos exposure, COPD, allergic rhinitis. Newly dx'd renal cancer  .. family here Had cryosurgery for his left renal cancer.- so far so good. Has had flu vaccine. Had chest x-ray since last here. Chest feels better with theophylline which was well tolerated. He still smokes one half pack per day we discussed trying again. We suggested nicotine replacement approaches since I notice he is chewing gum today.  08/24/11- 48 yoM Smoker with hx asbestos exposure, COPD, allergic rhinitis.  renal cancer  .. daughter here   PCP Dr Michael [name]. [name]/ Michael [name] Still smoking one quarter pack per day. Blames pollen for head congestion and sneeze. Admits gradually worsening dyspnea  on exertion. Coughs more in drafts or after exertion. Daily cough productive of white or yellow sputum with no blood and no chest pain. Spiriva continues to be helpful. Has 80% left carotid lesion, pending evaluation with Dr. Bridgett [name]. Hx bilateral TKR, but does exercise on bike and treadmill. CXR- 08/23/11 CHEST - 2 VIEW  Comparison: 03/03/2011  Findings: COPD. Pulmonary hyperinflation is present with mild  scarring. Negative for pneumonia or mass lesion. Negative for  heart failure or effusion. Surgical fusion is present.  IMPRESSION:  COPD. No acute cardiopulmonary disease.  Original Report Authenticated By: Michael [name], M.D.   03/01/12- 25 yoM Smoker with hx asbestos exposure, COPD, allergic rhinitis.  renal cancer  .. daughter here  Slight cough at times.   PCP Dr Michael [name]/ Michael [name] Had CEA in the Spring- did ok.  Has had flu vaccine Light and occasional cough. No change in dyspnea with exertion. Still goes dancing some. Has not tried to stop smoking but we worked on this again today. I'm hoping we can get him motivated to try. His urologist/Dr. Alinda [name] asked him to get another chest x-ray. Patient chooses to get that done while here today COPD assessment test (CAT) score 9/40 CXR 08/22/11-reviewed IMPRESSION:  COPD. No acute cardiopulmonary disease.  Original Report Authenticated By: Michael [name], M.D.    10/07/12- 5 yoM Smoker with hx asbestos exposure, COPD, allergic rhinitis.  renal cancer   DTR Michael [name] here FOLLOWS FOR: wheezing while lying down, SOB early in the morning, cough-productive-brown in color. So smoking 1  pack per week against counseling. Is not trying to cut down below this. He reports a chest x-[facility] at [facility] in May of 2014. We are seeking that report but he does not understand there was any problem. PFT: 06/10/2012/Michael [name] [place]-mild obstructive airways disease with insignificant response to bronchodilator, hyperinflation and air trapping, diffusion  moderately reduced. FVC 3.36/87%, FEV1 2.20/88%, FEV1/FVC of 0.66, TLC 121%, RV 148%, DLCO 54%. CXR 03/14/12 Findings: Unchanged linear lingular scarring. The lungs are clear.  No pulmonary edema, focal airspace consolidation or pulmonary  nodule. Chronic coarsening of the interstitial markings remains  unchanged. There is pectus excavatum on the lateral view.  Incompletely imaged anterior cervical fusion hardware at C7.  Tortuous thoracic aorta. Atherosclerotic calcifications in the  transverse aorta. No acute osseous abnormality.  IMPRESSION:  No acute osseous abnormality.  Pectus excavatum.  Original Report Authenticated By: Michael [name], M.D.  04/08/13- 87 yoM Smoker with hx asbestos exposure, COPD, allergic rhinitis.  renal cancer  DTR Michael [name] here FOLLOWS FOR: has SOB, wheezing, cough, and congestion at times. Currently being treated for sinus infection by Dr Michael [name]. PCP gave levaquin x 5 d for sinusitis- improving.Smokes after meals- discussed. Baseline is some cough, clear phlegm most days. No acute chest pain, blood or fever. Pending TKR/ Dr Michael [name].  CXR 04/03/13 IMPRESSION:  1. No acute cardiopulmonary process.  2. Emphysematous change  Electronically Signed  By: Michael [name] M.D.  On: 04/02/2013 13:30  10/07/13-81 yoM Smoker with hx asbestos exposure, COPD, allergic rhinitis.  renal cancer   DTR Michael [name] here FOLLOWS FOR: Productive cough-yellow in color x 2 weeks now. Denies any wheezing, SOB, or fever/chills. Trying to reduce cigarettes. Theophylline helps breathing. Keeps a raspy mid chest sensation and yellow sputum x3 weeks. Tolerated R TKR ok.  04/27/14- 3 yoM Smoker with hx asbestos exposure, COPD, allergic rhinitis.  renal cancer, prostate cancer DTR Michael [name] here FOLLOWS FOR: Pt has cough with yellow mucus. He has sob with exhertion and wheezing.  Still smoking with no effort to stop despite counseling. CXR 04/15/14- IMPRESSION: Bibasilar scarring, COPD. No  active disease. Electronically Signed  By: Michael [name] M.D.  On: 04/15/2014 12:27.  10/29/14- 43 yoM Smoker with hx asbestos exposure, COPD, allergic rhinitis.  renal cancer, prostate cancer DTR Michael [name] here FOLLOWS FOR Pt states having a productive cough with thick white to yellow mucus, wheezing, and chest discomfort x 2 weeks, without sore throat or fever. No blood. Forgets to use Spiriva. CXR 09/06/14 IMPRESSION: COPD without acute abnormality. Electronically Signed  By: Inez Catalina M.D.  On: 09/06/2014 15:10  Review of Systems-See HPI Constitutional:   No-   weight loss, night sweats, fevers, chills, fatigue, lassitude. HEENT:   No-  headaches, difficulty swallowing, tooth/dental problems, sore throat,       No-  sneezing, itching, ear ache,  +nasal congestion, post nasal drip,  CV:  No-   chest pain, orthopnea, PND, swelling in lower extremities, anasarca, dizziness, palpitations Resp: + shortness of breath with exertion or at rest.              + productive cough, little non-productive cough,  No-  coughing up of blood.              +change in color of mucus.  No- wheezing.   Skin: No-   rash or lesions. GI:  No-   heartburn, indigestion, abdominal pain, nausea, vomiting,  GU:  MS:  No-   joint pain or swelling.  Neuro- nothing unusual Psych:  No- change in mood or affect. No depression or anxiety.  No memory loss.  OBJ General- Alert, Oriented, Affect-appropriate, Distress- none acute; normal weight Skin- rash-none, lesions- none, excoriation- none Lymphadenopathy- none Head- atraumatic            Eyes- Gross vision intact, PERRLA, conjunctivae clear secretions            Ears- Hearing, canals-normal            Nose- Clear, no-Septal dev, mucus, polyps, erosion, perforation             Throat- Mallampati II , mucosa clear , drainage- none, tonsils- atrophic Neck- flexible , trachea midline, no stridor , thyroid nl,  Chest - symmetrical excursion , unlabored            Heart/CV- RRR , no murmur , no gallop  , no rub, nl s1 s2                           - JVD- none , edema- none, stasis changes- none, varices- none.                              + L carotid bruit (hx L CEA 2013)           Lung- decreased breath sounds, unlabored, + raspy breath sounds, wheeze -none , no- cough , dullness-none, rub- none           Chest wall-  Abd-  Br/ Gen/ Rectal- Not done, not indicated Extrem- cyanosis- none, clubbing, none, atrophy- none, strength- nl Neuro- grossly intact to observation

## 2014-11-02 NOTE — Assessment & Plan Note (Signed)
He has had repeated counseling efforts supported by family over a period of years but is not motivated to stop.

## 2014-11-02 NOTE — Assessment & Plan Note (Signed)
Acute exacerbation of COPD with acute bronchitis Plan-drop theophylline. Okay to try off Spiriva which he has not used regularly. Doxycycline for acute bronchitis.

## 2014-11-06 DIAGNOSIS — I10 Essential (primary) hypertension: Secondary | ICD-10-CM | POA: Diagnosis not present

## 2014-11-12 DIAGNOSIS — E782 Mixed hyperlipidemia: Secondary | ICD-10-CM | POA: Diagnosis not present

## 2014-11-12 DIAGNOSIS — I10 Essential (primary) hypertension: Secondary | ICD-10-CM | POA: Diagnosis not present

## 2014-11-12 DIAGNOSIS — M7551 Bursitis of right shoulder: Secondary | ICD-10-CM | POA: Diagnosis not present

## 2014-11-12 DIAGNOSIS — E871 Hypo-osmolality and hyponatremia: Secondary | ICD-10-CM | POA: Diagnosis not present

## 2014-11-12 DIAGNOSIS — M19011 Primary osteoarthritis, right shoulder: Secondary | ICD-10-CM | POA: Diagnosis not present

## 2014-11-12 DIAGNOSIS — N183 Chronic kidney disease, stage 3 (moderate): Secondary | ICD-10-CM | POA: Diagnosis not present

## 2014-11-12 DIAGNOSIS — M7501 Adhesive capsulitis of right shoulder: Secondary | ICD-10-CM | POA: Diagnosis not present

## 2014-11-12 DIAGNOSIS — J439 Emphysema, unspecified: Secondary | ICD-10-CM | POA: Diagnosis not present

## 2014-11-12 DIAGNOSIS — E039 Hypothyroidism, unspecified: Secondary | ICD-10-CM | POA: Diagnosis not present

## 2014-11-12 DIAGNOSIS — I708 Atherosclerosis of other arteries: Secondary | ICD-10-CM | POA: Diagnosis not present

## 2014-11-12 DIAGNOSIS — N400 Enlarged prostate without lower urinary tract symptoms: Secondary | ICD-10-CM | POA: Diagnosis not present

## 2014-11-12 DIAGNOSIS — K219 Gastro-esophageal reflux disease without esophagitis: Secondary | ICD-10-CM | POA: Diagnosis not present

## 2014-11-26 DIAGNOSIS — M25511 Pain in right shoulder: Secondary | ICD-10-CM | POA: Diagnosis not present

## 2014-11-26 DIAGNOSIS — M19011 Primary osteoarthritis, right shoulder: Secondary | ICD-10-CM | POA: Diagnosis not present

## 2014-12-03 ENCOUNTER — Other Ambulatory Visit (HOSPITAL_COMMUNITY): Payer: Self-pay | Admitting: Interventional Radiology

## 2014-12-03 ENCOUNTER — Other Ambulatory Visit: Payer: Self-pay | Admitting: Radiology

## 2014-12-03 DIAGNOSIS — C642 Malignant neoplasm of left kidney, except renal pelvis: Secondary | ICD-10-CM

## 2014-12-22 DIAGNOSIS — C61 Malignant neoplasm of prostate: Secondary | ICD-10-CM | POA: Diagnosis not present

## 2014-12-24 DIAGNOSIS — J019 Acute sinusitis, unspecified: Secondary | ICD-10-CM | POA: Diagnosis not present

## 2014-12-28 DIAGNOSIS — M19011 Primary osteoarthritis, right shoulder: Secondary | ICD-10-CM | POA: Diagnosis not present

## 2014-12-29 DIAGNOSIS — R350 Frequency of micturition: Secondary | ICD-10-CM | POA: Diagnosis not present

## 2014-12-29 DIAGNOSIS — C61 Malignant neoplasm of prostate: Secondary | ICD-10-CM | POA: Diagnosis not present

## 2015-01-05 DIAGNOSIS — H01009 Unspecified blepharitis unspecified eye, unspecified eyelid: Secondary | ICD-10-CM | POA: Diagnosis not present

## 2015-01-05 DIAGNOSIS — Z961 Presence of intraocular lens: Secondary | ICD-10-CM | POA: Diagnosis not present

## 2015-01-21 DIAGNOSIS — C642 Malignant neoplasm of left kidney, except renal pelvis: Secondary | ICD-10-CM | POA: Diagnosis not present

## 2015-01-27 ENCOUNTER — Ambulatory Visit (HOSPITAL_COMMUNITY)
Admission: RE | Admit: 2015-01-27 | Discharge: 2015-01-27 | Disposition: A | Discharge status: Home / Self Care | Payer: Medicare Other | Source: Ambulatory Visit | Attending: Interventional Radiology | Admitting: Interventional Radiology

## 2015-01-27 ENCOUNTER — Encounter (HOSPITAL_COMMUNITY): Payer: Self-pay

## 2015-01-27 DIAGNOSIS — C642 Malignant neoplasm of left kidney, except renal pelvis: Secondary | ICD-10-CM | POA: Diagnosis not present

## 2015-01-27 MED ORDER — IOHEXOL 300 MG/ML  SOLN
100.0000 mL | Freq: Once | INTRAMUSCULAR | Status: AC | PRN
  Administered 2015-01-27: 100 mL via INTRAVENOUS

## 2015-02-01 DIAGNOSIS — E782 Mixed hyperlipidemia: Secondary | ICD-10-CM | POA: Diagnosis not present

## 2015-02-01 DIAGNOSIS — J439 Emphysema, unspecified: Secondary | ICD-10-CM | POA: Diagnosis not present

## 2015-02-01 DIAGNOSIS — N183 Chronic kidney disease, stage 3 (moderate): Secondary | ICD-10-CM | POA: Diagnosis not present

## 2015-02-01 DIAGNOSIS — E039 Hypothyroidism, unspecified: Secondary | ICD-10-CM | POA: Diagnosis not present

## 2015-02-01 DIAGNOSIS — E871 Hypo-osmolality and hyponatremia: Secondary | ICD-10-CM | POA: Diagnosis not present

## 2015-02-01 DIAGNOSIS — I10 Essential (primary) hypertension: Secondary | ICD-10-CM | POA: Diagnosis not present

## 2015-02-01 DIAGNOSIS — K219 Gastro-esophageal reflux disease without esophagitis: Secondary | ICD-10-CM | POA: Diagnosis not present

## 2015-02-03 ENCOUNTER — Other Ambulatory Visit (HOSPITAL_COMMUNITY): Payer: Self-pay | Admitting: Interventional Radiology

## 2015-02-03 ENCOUNTER — Ambulatory Visit
Admission: RE | Admit: 2015-02-03 | Discharge: 2015-02-03 | Disposition: A | Discharge status: Home / Self Care | Payer: Medicare Other | Source: Ambulatory Visit | Attending: Interventional Radiology | Admitting: Interventional Radiology

## 2015-02-03 DIAGNOSIS — C642 Malignant neoplasm of left kidney, except renal pelvis: Secondary | ICD-10-CM

## 2015-02-03 DIAGNOSIS — E278 Other specified disorders of adrenal gland: Secondary | ICD-10-CM

## 2015-02-03 DIAGNOSIS — E279 Disorder of adrenal gland, unspecified: Principal | ICD-10-CM

## 2015-02-04 ENCOUNTER — Other Ambulatory Visit (HOSPITAL_COMMUNITY): Payer: Self-pay | Admitting: Interventional Radiology

## 2015-02-04 DIAGNOSIS — C642 Malignant neoplasm of left kidney, except renal pelvis: Secondary | ICD-10-CM

## 2015-02-08 DIAGNOSIS — N183 Chronic kidney disease, stage 3 (moderate): Secondary | ICD-10-CM | POA: Diagnosis not present

## 2015-02-08 DIAGNOSIS — K219 Gastro-esophageal reflux disease without esophagitis: Secondary | ICD-10-CM | POA: Diagnosis not present

## 2015-02-08 DIAGNOSIS — I10 Essential (primary) hypertension: Secondary | ICD-10-CM | POA: Diagnosis not present

## 2015-02-08 DIAGNOSIS — E782 Mixed hyperlipidemia: Secondary | ICD-10-CM | POA: Diagnosis not present

## 2015-02-08 DIAGNOSIS — E871 Hypo-osmolality and hyponatremia: Secondary | ICD-10-CM | POA: Diagnosis not present

## 2015-02-08 DIAGNOSIS — E039 Hypothyroidism, unspecified: Secondary | ICD-10-CM | POA: Diagnosis not present

## 2015-02-08 DIAGNOSIS — J439 Emphysema, unspecified: Secondary | ICD-10-CM | POA: Diagnosis not present

## 2015-02-08 DIAGNOSIS — F329 Major depressive disorder, single episode, unspecified: Secondary | ICD-10-CM | POA: Diagnosis not present

## 2015-02-08 DIAGNOSIS — J302 Other seasonal allergic rhinitis: Secondary | ICD-10-CM | POA: Diagnosis not present

## 2015-02-08 DIAGNOSIS — Z23 Encounter for immunization: Secondary | ICD-10-CM | POA: Diagnosis not present

## 2015-02-08 DIAGNOSIS — F17210 Nicotine dependence, cigarettes, uncomplicated: Secondary | ICD-10-CM | POA: Diagnosis not present

## 2015-02-08 DIAGNOSIS — I708 Atherosclerosis of other arteries: Secondary | ICD-10-CM | POA: Diagnosis not present

## 2015-02-18 DIAGNOSIS — C649 Malignant neoplasm of unspecified kidney, except renal pelvis: Secondary | ICD-10-CM | POA: Insufficient documentation

## 2015-02-18 NOTE — Progress Notes (Signed)
Chief Complaint: Post cryoablation of a left renal papillary carcinoma on 11/25/2010.  History of Present Illness: Michael [name] is an 81 y.o. male 4 years status post cryoablation of a left papillary renal carcinoma. He is currently asymptomatic and doing well. Follow-up CT performed on 01/27/2015 demonstrates a stable ablation defect of the left mid kidney without evidence of recurrent enhancing tumor. There is a more prominent water density nodule along the medial aspect of the left adrenal gland. This was felt to potentially represent an exophytic adenoma. MRI was recommended for further evaluation.  Past Medical History  Diagnosis Date  . Hyperthyroidism 1978    Status post radioactive iodine treatment  . History of herpes zoster 2001  . Vitreous hemorrhage MAY 2009     Right eye  . Peripheral arterial disease   . Mixed hyperlipidemia   . Essential hypertension, benign   . Allergic rhinitis   . COPD (chronic obstructive pulmonary disease)   . History of asbestos exposure   . Depression   . Anemia   . Carotid artery occlusion   . History of myocardial infarction   . Coronary atherosclerosis of native coronary artery     BMS RCA 2003, DES RCA 2004, DES RCA 2005   . GERD (gastroesophageal reflux disease)   . Hemorrhoids   . Head injury, closed, with brief LOC   . HOH (hard of hearing)   . Hypothyroidism   . Lumbar pain   . Psoriasis   . Prostate cancer     Status post XRT/IMRT 2008 - Dr. Laverle Patter  . Renal cancer     Cryoablation in 2012  . Pneumonia     November 2014    Past Surgical History  Procedure Laterality Date  . Popliteal synovial cyst excision      knee  . Cervical disc surgery  APRIL 2004    fusion  . Partial knee arthroplasty  JULY 2005    Right-replacement  . Nasal septum surgery      SMR  . Neck disk fusion  07/2002  . Knee arthroplasty      bilateral  . Endarterectomy  08/30/2011    Procedure: Left ENDARTERECTOMY CAROTID;  Surgeon: [name], MD;  Location: [facility] OR;  Service: Vascular;  Laterality: Left;  . Joint replacement Right 2005    Right partial Knee  . Joint replacement Left 2011    Left toatal Knee  . Endarterectomy Right 12/18/2012    Procedure: ENDARTERECTOMY CAROTID WITH BOVINE PATCH ANGIOPLASTY;  Surgeon: [name], MD;  Location: [facility] OR;  Service: Vascular;  Laterality: Right;  . Carotid endarterectomy    . Cholecystectomy  1995  . Cardiac catheterization      AUG 2003, AUG 2004, OCT 2005 9 HAS 3 STENTS DONE AT 3 DIFFERENT CATHS - ONE IS DRUG ELUTING STENT; CATH 2006, 2007, 2009 2010  . Eye surgery  2010    bilat cataract.2009-Vitreous Hemorrhage-2009  . Total knee revision Right 08/06/2013    Procedure: REVISION RIGHT TOTAL KNEE ARTHROPLASTY  ;  Surgeon: Loanne Drilling, MD;  Location: [facility] ORS;  Service: Orthopedics;  Laterality: Right;    Allergies: Ciprofloxacin; Iron; Lyrica; Morphine and related; Oxycodone; Penicillins; and Hydrocodone-acetaminophen  Medications: Prior to Admission medications   Medication Sig Start Date End Date Taking? Authorizing Provider  albuterol (PROAIR HFA) 108 (90 BASE) MCG/ACT inhaler Inhale 2 puffs into the lungs every 6 (six) hours as needed. For shortness of breath   Yes  Historical Provider, MD  amLODipine (NORVASC) 10 MG tablet Take 10 mg by mouth every morning.    Yes Historical Provider, MD  aspirin 81 MG tablet Take 81 mg by mouth every morning.    Yes Historical Provider, MD  atorvastatin (LIPITOR) 40 MG tablet Take 40 mg by mouth at bedtime.    Yes Historical Provider, MD  cetirizine (ZYRTEC) 10 MG tablet Take 10 mg by mouth every morning.    Yes Historical Provider, MD  citalopram (CELEXA) 20 MG tablet Take 20 mg by mouth every morning.    Yes Historical Provider, MD  ferrous sulfate 325 (65 FE) MG tablet Take 325 mg by mouth at bedtime.    Yes Historical Provider, MD  Fluticasone-Salmeterol (ADVAIR DISKUS) 250-50 MCG/DOSE AEPB Inhale 1 puff into the lungs every 12  (twelve) hours.     Yes Historical Provider, MD  isosorbide mononitrate (IMDUR) 60 MG 24 hr tablet TAKE 1 TABLET TWICE A DAY (DOSE INCREASE) 10/05/14  Yes Satira Sark, MD  levothyroxine (SYNTHROID, LEVOTHROID) 125 MCG tablet Take 125 mcg by mouth every morning.    Yes Historical Provider, MD  [facility] ARTHRITIS PAIN RELIEF) PADS Apply 1 each topically as needed (Shoulder Pain).   Yes Historical Provider, MD  mometasone (NASONEX) 50 MCG/ACT nasal spray Place 2 sprays into the nose at bedtime. 11/24/10  Yes Deneise Lever, MD  Multiple Vitamin (MULTIVITAMIN) tablet Take 1 tablet by mouth every morning.    Yes Historical Provider, MD  nitroGLYCERIN (NITROSTAT) 0.4 MG SL tablet Place 1 tablet (0.4 mg total) under the tongue every 5 (five) minutes as needed. For chest pain 12/23/13  Yes Satira Sark, MD  Omega 3 1200 MG CAPS Take 2 capsules by mouth 2 (two) times daily. Take 2 capsules in AM and 2 capsules in  PM   Yes Historical Provider, MD  polyethylene glycol (MIRALAX / GLYCOLAX) packet Take 17 g by mouth daily as needed for mild constipation. 08/06/13  Yes Amber Cecilio Asper, PA-C  ranitidine (ZANTAC) 150 MG tablet Take 150 mg by mouth every morning.    Yes Historical Provider, MD  traZODone (DESYREL) 100 MG tablet Take 100 mg by mouth at bedtime as needed. For sleep   Yes Historical Provider, MD  acetaminophen (TYLENOL) 650 MG CR tablet Take 1,300 mg by mouth every 8 (eight) hours as needed for pain.    Historical Provider, MD  clobetasol cream (TEMOVATE) 2.11 % Apply 1 application topically daily. Applied to legs for psoriasis    Historical Provider, MD  doxycycline (VIBRA-TABS) 100 MG tablet 2 today then one daily Patient not taking: Reported on 02/03/2015 10/29/14   Deneise Lever, MD  theophylline (THEODUR) 100 MG 12 hr tablet Take 1 tablet (100 mg total) by mouth 2 (two) times daily. Patient not taking: Reported on 02/03/2015 10/06/14   Deneise Lever, MD  tiotropium (SPIRIVA) 18  MCG inhalation capsule Place 1 capsule (18 mcg total) into inhaler and inhale daily. Patient not taking: Reported on 02/03/2015 09/21/14   Deneise Lever, MD     Family History  Problem Relation Age of Onset  . Coronary artery disease Brother   . Cancer Brother   . Heart disease Brother   . Heart attack Brother   . Anesthesia problems Neg Hx     Social History   Social History  . Marital Status: Widowed    Spouse Name: N/A  . Number of Children: N/A  . Years of Education: N/A  Occupational History  . Retired      [employer]   Social History Main Topics  . Smoking status: Current Every Day Smoker -- 0.25 packs/day for 60 years    Types: Cigarettes    Start date: 10/20/1943  . Smokeless tobacco: Never Used  . Alcohol Use: No     Comment: TRYING TO QUIT SMOKING  . Drug Use: No  . Sexual Activity: Not Currently   Other Topics Concern  . Not on file   Social History Narrative    Review of Systems: A 12 point ROS discussed and pertinent positives are indicated in the HPI above.  All other systems are negative.  Review of Systems  Constitutional: Negative.   Respiratory: Negative.   Cardiovascular: Negative.   Gastrointestinal: Negative.   Genitourinary: Negative.   Musculoskeletal: Negative.   Neurological: Negative.     Vital Signs: BP 144/66 mmHg  Pulse 75  Temp(Src) 96 F (35.6 C) (Oral)  Resp 13  SpO2 96%  Physical Exam  Constitutional: No distress.  Abdominal: Soft. He exhibits no distension. There is no tenderness. There is no rebound and no guarding.  Skin: He is not diaphoretic.  Nursing note and vitals reviewed.   Imaging: Ct Abd Wo & W Cm  01/27/2015  CLINICAL DATA:  Subsequent encounter for LEFT RENAL CANCER. STATUS POST CRYOABLATION IN 2012. EXAM: CT ABDOMEN WITHOUT AND WITH CONTRAST TECHNIQUE: Multidetector CT imaging of the abdomen was performed following the standard protocol before and following the bolus administration of  intravenous contrast. CONTRAST:  166mL OMNIPAQUE IOHEXOL 300 MG/ML  SOLN COMPARISON:  08/20/2014. FINDINGS: Lower chest: Chronic interstitial changes at the lung bases, stable. Hepatobiliary: 8 mm low-density lesion in the dome of the liver is unchanged, likely a tiny cyst. Gallbladder is surgically absent. Trace biliary prominence in the right liver is stable. No extrahepatic biliary duct dilatation. Pancreas: No focal mass lesion. No dilatation of the main duct. No intraparenchymal cyst. No peripancreatic edema. Spleen: No splenomegaly. No focal mass lesion. Adrenals/Urinary Tract: No adrenal nodule or mass. Exophytic cyst in the upper pole the right kidney measures up to 4 cm in diameter today. No evidence for enhancement after IV contrast administration. Other smaller cysts are evident in the right kidney. Ablation defect is identified in the posterior aspect of the interpolar left kidney. Immediately adjacent to the defect is a 2.0 x 2.0 cm lesion that shows no evidence of enhancement after IV contrast administration, compatible with a complex or hemorrhagic cyst. Other scattered cysts are identified in the left kidney. No evidence for hydronephrosis. Stomach/Bowel: Stomach is nondistended. No gastric wall thickening. No evidence of outlet obstruction. Duodenum diverticulum noted. Visualize small bowel loops of the abdomen are unremarkable. A few scattered diverticuli are seen in the abdominal segments of the colon. Vascular/Lymphatic: There is abdominal aortic atherosclerosis without aneurysm. 1.3 x 2.1 cm left para-aortic nodule approaches water attenuation, but has progressed in the interval from 10 x 9 mm on the previous study. This is medial to and contiguous with the left adrenal gland. Other: No intraperitoneal free fluid. Musculoskeletal: Bone windows reveal no worrisome lytic or sclerotic osseous lesions. IMPRESSION: Interval progression of a small left para-aortic nodule, measuring near water  attenuation. This is contiguous with the medial limb of the left adrenal gland and while coronal images suggest that it may be distinct from the adrenal parenchyma, a small exophytic adrenal adenoma is a possibility given the low-density. No convincing evidence for enhancement within this lesion on axial  imaging. Given that the lesion is progressive, MRI without and with contrast is recommended to more definitively assess for intralesional lipid content and/or enhancement as recurrent disease cannot be definitely excluded. Electronically Signed   By: Misty Stanley M.D.   On: 01/27/2015 11:32    Labs:  CBC:  Recent Labs  07/03/14 1402 09/06/14 1436  WBC 7.8 9.8  HGB 13.4 13.6  HCT 38.0* 40.3  PLT 225 263    COAGS: No results for input(s): INR, APTT in the last 8760 hours.  BMP:  Recent Labs  07/03/14 1402 09/06/14 1436  NA 135 135  K 3.8 3.9  CL 103 101  CO2 26 27  GLUCOSE 112* 100*  BUN 12 13  CALCIUM 9.1 9.3  CREATININE 1.30 1.17  GFRNONAA 50* 57*  GFRAA 58* >60    LIVER FUNCTION TESTS:  Recent Labs  09/06/14 1436  BILITOT 0.5  AST 24  ALT 15*  ALKPHOS 70  PROT 7.1  ALBUMIN 3.8     Assessment and Plan:  I met with Michael [name] and his daughter. We reviewed the recent CT scan. This shows no evidence of recurrent left renal papillary carcinoma following ablation 4 years ago. I recommended one additional annual follow-up study to evaluate the ablation site next year. I did recommend that we schedule an MRI of the abdomen, as recommended on the recent CT report to evaluate the larger nodule associated with the left adrenal gland. We will schedule this soon on an outpatient basis.  SignedAletta Edouard T 02/18/2015, 12:36 PM   I spent a total of 15 Minutes in face to face in clinical consultation, greater than 50% of which was counseling/coordinating care post left renal cryoablation.

## 2015-02-23 DIAGNOSIS — J019 Acute sinusitis, unspecified: Secondary | ICD-10-CM | POA: Diagnosis not present

## 2015-02-23 DIAGNOSIS — J440 Chronic obstructive pulmonary disease with acute lower respiratory infection: Secondary | ICD-10-CM | POA: Diagnosis not present

## 2015-02-25 ENCOUNTER — Ambulatory Visit (HOSPITAL_COMMUNITY)
Admission: RE | Admit: 2015-02-25 | Discharge: 2015-02-25 | Disposition: A | Discharge status: Home / Self Care | Payer: Medicare Other | Source: Ambulatory Visit | Attending: Interventional Radiology | Admitting: Interventional Radiology

## 2015-02-25 DIAGNOSIS — N281 Cyst of kidney, acquired: Secondary | ICD-10-CM | POA: Insufficient documentation

## 2015-02-25 DIAGNOSIS — E278 Other specified disorders of adrenal gland: Secondary | ICD-10-CM

## 2015-02-25 DIAGNOSIS — R935 Abnormal findings on diagnostic imaging of other abdominal regions, including retroperitoneum: Secondary | ICD-10-CM | POA: Diagnosis not present

## 2015-02-25 DIAGNOSIS — E279 Disorder of adrenal gland, unspecified: Secondary | ICD-10-CM | POA: Insufficient documentation

## 2015-02-25 LAB — POCT I-STAT CREATININE: CREATININE: 1.3 mg/dL — AB (ref 0.61–1.24)

## 2015-02-25 MED ORDER — GADOBENATE DIMEGLUMINE 529 MG/ML IV SOLN
15.0000 mL | Freq: Once | INTRAVENOUS | Status: AC | PRN
  Administered 2015-02-25: 14 mL via INTRAVENOUS

## 2015-02-26 DIAGNOSIS — Z961 Presence of intraocular lens: Secondary | ICD-10-CM | POA: Diagnosis not present

## 2015-02-26 DIAGNOSIS — H524 Presbyopia: Secondary | ICD-10-CM | POA: Diagnosis not present

## 2015-02-26 DIAGNOSIS — H353121 Nonexudative age-related macular degeneration, left eye, early dry stage: Secondary | ICD-10-CM | POA: Diagnosis not present

## 2015-02-26 DIAGNOSIS — H59091 Other disorders of the right eye following cataract surgery: Secondary | ICD-10-CM | POA: Diagnosis not present

## 2015-03-04 ENCOUNTER — Ambulatory Visit
Admission: RE | Admit: 2015-03-04 | Discharge: 2015-03-04 | Disposition: A | Discharge status: Home / Self Care | Payer: Medicare Other | Source: Ambulatory Visit | Attending: Interventional Radiology | Admitting: Interventional Radiology

## 2015-03-04 DIAGNOSIS — E278 Other specified disorders of adrenal gland: Secondary | ICD-10-CM | POA: Diagnosis not present

## 2015-03-04 DIAGNOSIS — C642 Malignant neoplasm of left kidney, except renal pelvis: Secondary | ICD-10-CM | POA: Diagnosis not present

## 2015-03-13 DIAGNOSIS — E278 Other specified disorders of adrenal gland: Secondary | ICD-10-CM | POA: Insufficient documentation

## 2015-03-13 NOTE — Progress Notes (Signed)
Chief Complaint: Water density enlarging nodule along the medial aspect of the left adrenal gland.  History of Present Illness: Michael [name] is an 81 y.o. male 4 years status post cryoablation of a left papillary renal carcinoma. Recent CT on 01/27/2015 demonstrated an enlarging cystic abnormality along the medial aspect of the left adrenal gland. This was further evaluated by MRI on 02/25/2015 and the patient returns to review MRI findings.  Past Medical History  Diagnosis Date  . Hyperthyroidism 1978    Status post radioactive iodine treatment  . History of herpes zoster 2001  . Vitreous hemorrhage ([place]) MAY 2009     Right eye  . Peripheral arterial disease ([place])   . Mixed hyperlipidemia   . Essential hypertension, benign   . Allergic rhinitis   . COPD (chronic obstructive pulmonary disease) ([place])   . History of asbestos exposure   . Depression   . Anemia   . Carotid artery occlusion   . History of myocardial infarction   . Coronary atherosclerosis of native coronary artery     BMS RCA 2003, DES RCA 2004, DES RCA 2005   . GERD (gastroesophageal reflux disease)   . Hemorrhoids   . Head injury, closed, with brief LOC ([place])   . HOH (hard of hearing)   . Hypothyroidism   . Lumbar pain   . Psoriasis   . Prostate cancer [facility])     Status post XRT/IMRT 2008 - Dr. Alinda Money  . Renal cancer ([place])     Cryoablation in 2012  . Pneumonia     November 2014    Past Surgical History  Procedure Laterality Date  . Popliteal synovial cyst excision      knee  . Cervical disc surgery  APRIL 2004    fusion  . Partial knee arthroplasty  JULY 2005    Right-replacement  . Nasal septum surgery      SMR  . Neck disk fusion  07/2002  . Knee arthroplasty      bilateral  . Endarterectomy  08/30/2011    Procedure: Left ENDARTERECTOMY CAROTID;  Surgeon: Conrad Allen, MD;  Location: [place];  Service: Vascular;  Laterality: Left;  . Joint replacement Right 2005    Right partial Knee  .  Joint replacement Left 2011    Left toatal Knee  . Endarterectomy Right 12/18/2012    Procedure: ENDARTERECTOMY CAROTID WITH BOVINE PATCH ANGIOPLASTY;  Surgeon: Conrad Storden, MD;  Location: [place];  Service: Vascular;  Laterality: Right;  . Carotid endarterectomy    . Cholecystectomy  1995  . Cardiac catheterization      AUG 2003, AUG 2004, OCT 2005 9 HAS 3 STENTS DONE AT 3 DIFFERENT CATHS - ONE IS DRUG ELUTING STENT; CATH 2006, 2007, 2009 2010  . Eye surgery  2010    bilat cataract.2009-Vitreous Hemorrhage-2009  . Total knee revision Right 08/06/2013    Procedure: REVISION RIGHT TOTAL KNEE ARTHROPLASTY  ;  Surgeon: Gearlean Alf, MD;  [facility];  Service: Orthopedics;  Laterality: Right;    Allergies: Hydrocodone-acetaminophen; Oxycodone; Penicillins; Lyrica; Morphine and related; Ciprofloxacin; and Iron  Medications: Prior to Admission medications   Medication Sig Start Date End Date Taking? Authorizing Provider  acetaminophen (TYLENOL) 650 MG CR tablet Take 1,300 mg by mouth every 8 (eight) hours as needed for pain.    Historical Provider, MD  albuterol (PROAIR HFA) 108 (90 BASE) MCG/ACT inhaler Inhale 2 puffs into the lungs every 6 (six) hours  as needed. For shortness of breath    Historical Provider, MD  amLODipine (NORVASC) 10 MG tablet Take 10 mg by mouth every morning.     Historical Provider, MD  aspirin 81 MG tablet Take 81 mg by mouth every morning.     Historical Provider, MD  atorvastatin (LIPITOR) 40 MG tablet Take 40 mg by mouth at bedtime.     Historical Provider, MD  cetirizine (ZYRTEC) 10 MG tablet Take 10 mg by mouth every morning.     Historical Provider, MD  citalopram (CELEXA) 20 MG tablet Take 20 mg by mouth every morning.     Historical Provider, MD  clobetasol cream (TEMOVATE) 9.38 % Apply 1 application topically daily. Applied to legs for psoriasis    Historical Provider, MD  doxycycline (VIBRA-TABS) 100 MG tablet 2 today then one daily Patient not taking:  Reported on 02/03/2015 10/29/14   Deneise Lever, MD  ferrous sulfate 325 (65 FE) MG tablet Take 325 mg by mouth at bedtime.     Historical Provider, MD  Fluticasone-Salmeterol (ADVAIR DISKUS) 250-50 MCG/DOSE AEPB Inhale 1 puff into the lungs every 12 (twelve) hours.      Historical Provider, MD  isosorbide mononitrate (IMDUR) 60 MG 24 hr tablet TAKE 1 TABLET TWICE A DAY (DOSE INCREASE) 10/05/14   Satira Sark, MD  levothyroxine (SYNTHROID, LEVOTHROID) 125 MCG tablet Take 125 mcg by mouth every morning.     Historical Provider, MD  Liniments [facility] ARTHRITIS PAIN RELIEF) PADS Apply 1 each topically as needed (Shoulder Pain).    Historical Provider, MD  mometasone (NASONEX) 50 MCG/ACT nasal spray Place 2 sprays into the nose at bedtime. 11/24/10   Deneise Lever, MD  Multiple Vitamin (MULTIVITAMIN) tablet Take 1 tablet by mouth every morning.     Historical Provider, MD  nitroGLYCERIN (NITROSTAT) 0.4 MG SL tablet Place 1 tablet (0.4 mg total) under the tongue every 5 (five) minutes as needed. For chest pain 12/23/13   Satira Sark, MD  Omega 3 1200 MG CAPS Take 2 capsules by mouth 2 (two) times daily. Take 2 capsules in AM and 2 capsules in  PM    Historical Provider, MD  polyethylene glycol (MIRALAX / GLYCOLAX) packet Take 17 g by mouth daily as needed for mild constipation. 08/06/13   Amber Cecilio Asper, PA-C  ranitidine (ZANTAC) 150 MG tablet Take 150 mg by mouth every morning.     Historical Provider, MD  theophylline (THEODUR) 100 MG 12 hr tablet Take 1 tablet (100 mg total) by mouth 2 (two) times daily. Patient not taking: Reported on 02/03/2015 10/06/14   Deneise Lever, MD  tiotropium (SPIRIVA) 18 MCG inhalation capsule Place 1 capsule (18 mcg total) into inhaler and inhale daily. Patient not taking: Reported on 02/03/2015 09/21/14   Deneise Lever, MD  traZODone (DESYREL) 100 MG tablet Take 100 mg by mouth at bedtime as needed. For sleep    Historical Provider, MD     Family History    Problem Relation Age of Onset  . Coronary artery disease Brother   . Cancer Brother   . Heart disease Brother   . Heart attack Brother   . Anesthesia problems Neg Hx     Social History   Social History  . Marital Status: Widowed    Spouse Name: N/A  . Number of Children: N/A  . Years of Education: N/A   Occupational History  . Retired      [employer]   Social  History Main Topics  . Smoking status: Current Every Day Smoker -- 0.25 packs/day for 60 years    Types: Cigarettes    Start date: 10/20/1943  . Smokeless tobacco: Never Used  . Alcohol Use: No     Comment: TRYING TO QUIT SMOKING  . Drug Use: No  . Sexual Activity: Not Currently   Other Topics Concern  . None   Social History Narrative     Review of Systems: A 12 point ROS discussed and pertinent positives are indicated in the HPI above.  All other systems are negative.  Review of Systems  Vital Signs: BP 125/61 mmHg  Pulse 60  Temp(Src) 97.7 F (36.5 C)  Resp 14  SpO2 95%  Physical Exam   Imaging: Mr Abdomen W Wo Contrast  02/25/2015  CLINICAL DATA:  Indeterminate lesion in the retroperitoneum adjacent to the adrenal gland. EXAM: MRI ABDOMEN WITHOUT AND WITH CONTRAST TECHNIQUE: Multiplanar multisequence MR imaging of the abdomen was performed both before and after the administration of intravenous contrast. CONTRAST:  34m MULTIHANCE GADOBENATE DIMEGLUMINE 529 MG/ML IV SOLN COMPARISON:  CT 528 1,014, MRI 09/12/2013, 06/30/2008 FINDINGS: Lower chest: Mild bibasilar atelectasis. Hepatobiliary: Benign hepatic cysts are again demonstrated. No enhancing hepatic lesion. Postcholecystectomy. Pancreas: A tiny 6 mm cysts within the parenchyma of the pancreas on image 24, series 7 is unchanged from comparison exams. No enhancing pancreatic lesion. Spleen: Normal spleen Adrenals/urinary tract: Lesion of concern adjacent to the LEFT adrenal gland is high signal intensity on T2 weighted imaging (series  3) consistent with fluid. This lesion was present with similar intensity on MRI 09/13/2010. Lesion has increased in size measuring 19 mm x 12 mm compared to 12 mm x 7 mm at that time. The lesion has low signal intensity on T1 weighted imaging and demonstrates no post-contrast enhancement (image 42, series 102). High-signal intensity hemorrhagic cyst extending from the LEFT kidney adjacent the ablation site demonstrates no post-contrast enhancement. There is no enhancing tissue at the ablation site (image 55, series 1003). There are bilateral hemorrhagic and nonhemorrhagic nonenhancing renal cysts. Nonenhancing character seen on subtraction image series 11002. Stomach/Bowel: Stomach, small bowel, appendix, and cecum are normal. The colon and rectosigmoid colon are normal. Vascular/Lymphatic: Abdominal aorta is normal caliber. There is no retroperitoneal or periportal lymphadenopathy. No pelvic lymphadenopathy. Musculoskeletal: No aggressive osseous lesion. IMPRESSION: 1. Small nonenhancing cystic lesion adjacent to the LEFT adrenal gland is slowly enlarging from MRI of 09/13/2010. No worrisome features. Lesion likely represents small adrenal cysts, lymphangioma or lymphocele. 2. No evidence of local recurrence at ablation site in the posterior aspect LEFT kidney. 3. Bilateral nonenhancing Bosniak 1 Bosniak 2 renal cysts Electronically Signed   By: SSuzy BouchardM.D.   On: 02/25/2015 15:15    Labs:  CBC:  Recent Labs  07/03/14 1402 09/06/14 1436  WBC 7.8 9.8  HGB 13.4 13.6  HCT 38.0* 40.3  PLT 225 263    COAGS: No results for input(s): INR, APTT in the last 8760 hours.  BMP:  Recent Labs  07/03/14 1402 09/06/14 1436 02/25/15 1354  NA 135 135  --   K 3.8 3.9  --   CL 103 101  --   CO2 26 27  --   GLUCOSE 112* 100*  --   BUN 12 13  --   CALCIUM 9.1 9.3  --   CREATININE 1.30 1.17 1.30*  GFRNONAA 50* 57*  --   GFRAA 58* >60  --     LIVER FUNCTION  TESTS:  Recent Labs   09/06/14 1436  BILITOT 0.5  AST 24  ALT 15*  ALKPHOS 70  PROT 7.1  ALBUMIN 3.8    Assessment and Plan:  I reviewed MRI findings with Mr. Alpern and one of his daughters. The MRI shows that this represents a benign cystic abnormality without enhancement and represents either adrenal cyst or lymphangioma. I recommended one more annual follow-up CT of the abdomen to evaluate the left renal ablation site next year. The patient is agreeable.  SignedAletta Edouard T 03/13/2015, 3:00 PM     I spent a total of 10 Minutes in face to face in clinical consultation, greater than 50% of which was counseling/coordinating care for review of a left adrenal lesion.

## 2015-03-27 IMAGING — CR DG CHEST 2V
2 series · 2 of 2 positions shown · non-contrast
Comparison: Portable chest x-ray 05/28/2013, 04/29/2013. Two-view
chest x-ray 04/29/2013, 03/01/2012.

CLINICAL DATA: One week history of cough and shortness of breath.
Smoker with current history of COPD. History of asbestos exposure.

EXAM:
CHEST  2 VIEW

[view not recorded (1 of 2)]
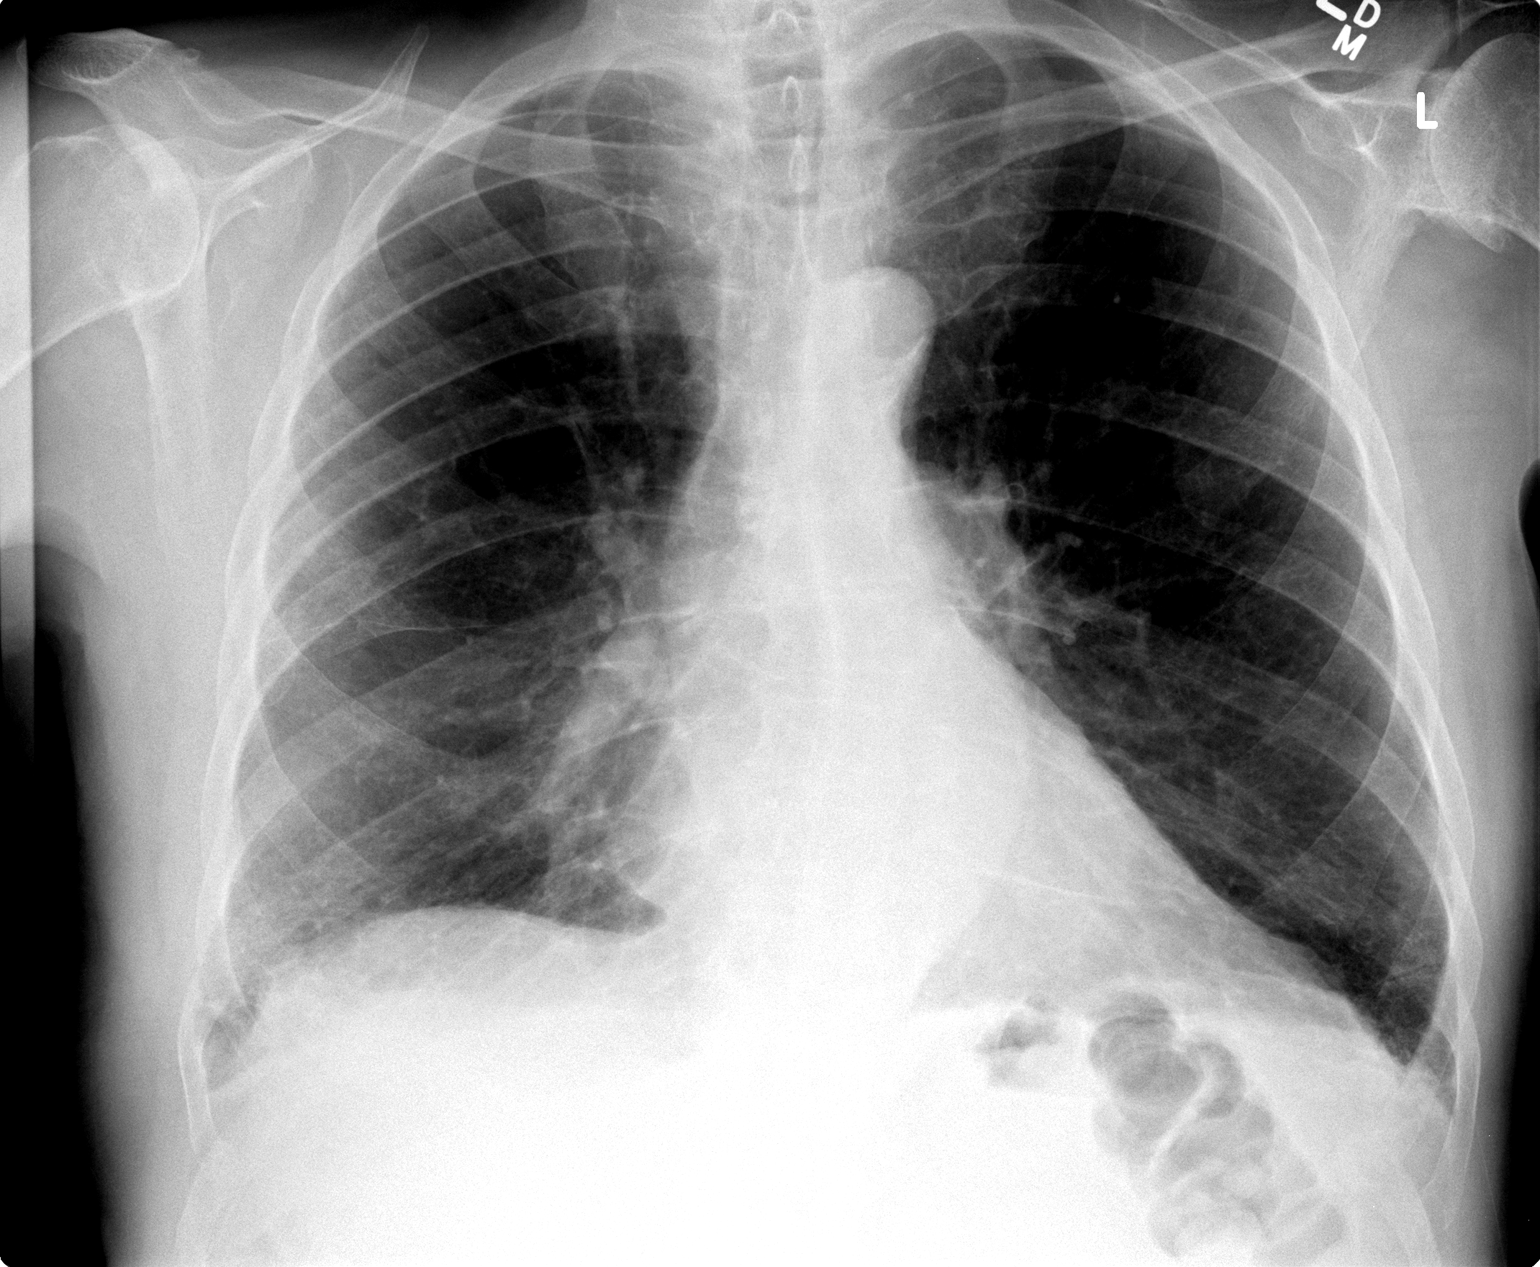

[view not recorded (2 of 2)]
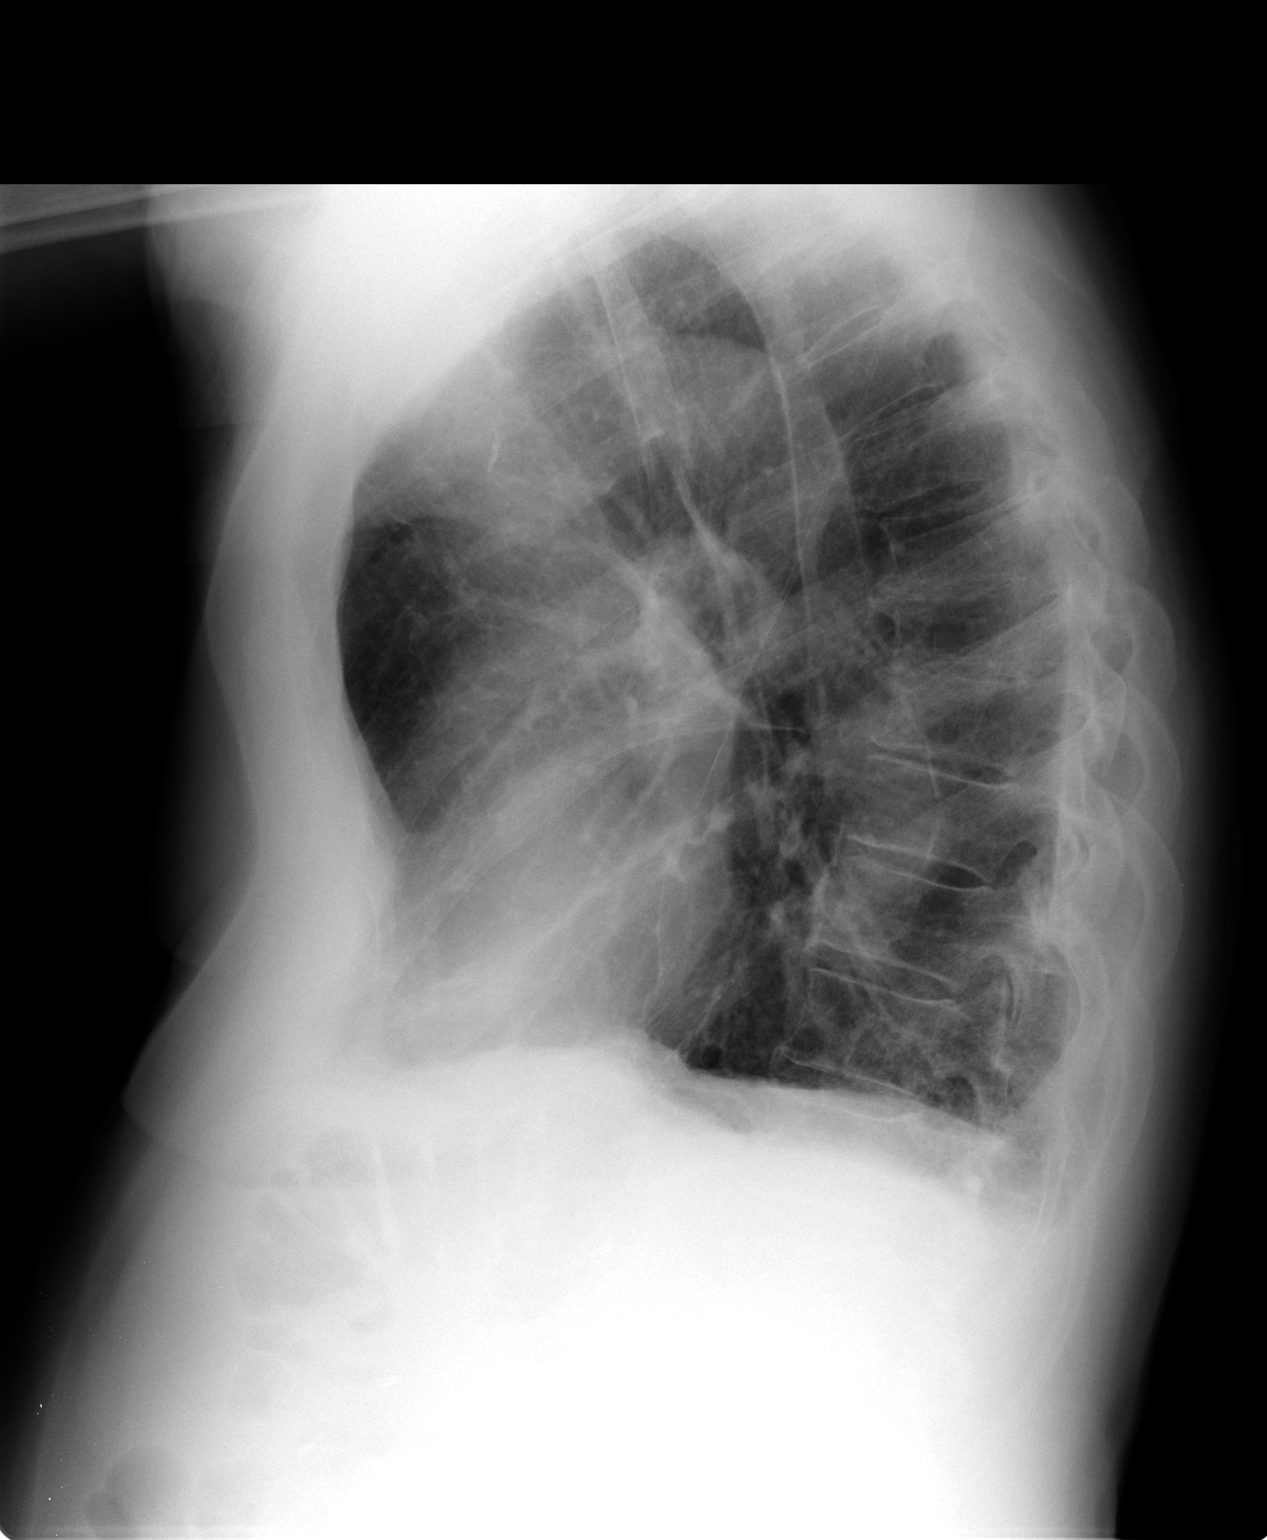

[2 of 2 positions shown; findings below may reference images not displayed]

FINDINGS: Cardiac silhouette normal in size, unchanged. Thoracic aorta
tortuous and atherosclerotic, unchanged. Hilar and mediastinal
contours otherwise unremarkable. Linear scarring in the lower lobes,
right middle lobe, and right upper lobe. Lungs otherwise clear. No
localized airspace consolidation. No pleural effusions. No
pneumothorax. Normal pulmonary vascularity. Emphysematous changes in
the upper lobes, unchanged. Degenerative changes involving the mid
thoracic spine. Pectus excavatum sternal deformity.
IMPRESSION: 1. Stable COPD/emphysema.  No acute cardiopulmonary disease.
2. Stable scarring in the lower lobes, right middle lobe and
lingula.

## 2015-04-09 DIAGNOSIS — R05 Cough: Secondary | ICD-10-CM | POA: Diagnosis not present

## 2015-04-09 DIAGNOSIS — J019 Acute sinusitis, unspecified: Secondary | ICD-10-CM | POA: Diagnosis not present

## 2015-04-16 DIAGNOSIS — M19011 Primary osteoarthritis, right shoulder: Secondary | ICD-10-CM | POA: Diagnosis not present

## 2015-04-16 DIAGNOSIS — M25511 Pain in right shoulder: Secondary | ICD-10-CM | POA: Diagnosis not present

## 2015-04-19 DIAGNOSIS — H26491 Other secondary cataract, right eye: Secondary | ICD-10-CM | POA: Diagnosis not present

## 2015-04-19 DIAGNOSIS — Z961 Presence of intraocular lens: Secondary | ICD-10-CM | POA: Diagnosis not present

## 2015-04-28 DIAGNOSIS — C61 Malignant neoplasm of prostate: Secondary | ICD-10-CM | POA: Diagnosis not present

## 2015-04-30 ENCOUNTER — Ambulatory Visit: Payer: Self-pay | Admitting: Internal Medicine

## 2015-05-05 DIAGNOSIS — Z0000 Encounter for general adult medical examination without abnormal findings: Secondary | ICD-10-CM | POA: Diagnosis not present

## 2015-05-05 DIAGNOSIS — R350 Frequency of micturition: Secondary | ICD-10-CM | POA: Diagnosis not present

## 2015-05-05 DIAGNOSIS — C61 Malignant neoplasm of prostate: Secondary | ICD-10-CM | POA: Diagnosis not present

## 2015-05-17 DIAGNOSIS — H26491 Other secondary cataract, right eye: Secondary | ICD-10-CM | POA: Diagnosis not present

## 2015-05-21 DIAGNOSIS — R0981 Nasal congestion: Secondary | ICD-10-CM | POA: Diagnosis not present

## 2015-05-25 DIAGNOSIS — F17210 Nicotine dependence, cigarettes, uncomplicated: Secondary | ICD-10-CM | POA: Diagnosis not present

## 2015-05-25 DIAGNOSIS — J019 Acute sinusitis, unspecified: Secondary | ICD-10-CM | POA: Diagnosis not present

## 2015-05-25 DIAGNOSIS — J439 Emphysema, unspecified: Secondary | ICD-10-CM | POA: Diagnosis not present

## 2015-05-25 DIAGNOSIS — I10 Essential (primary) hypertension: Secondary | ICD-10-CM | POA: Diagnosis not present

## 2015-05-25 DIAGNOSIS — J302 Other seasonal allergic rhinitis: Secondary | ICD-10-CM | POA: Diagnosis not present

## 2015-06-10 ENCOUNTER — Encounter: Payer: Self-pay | Admitting: Cardiology

## 2015-06-10 ENCOUNTER — Ambulatory Visit (INDEPENDENT_AMBULATORY_CARE_PROVIDER_SITE_OTHER): Payer: Medicare Other | Admitting: Cardiology

## 2015-06-10 VITALS — BP 118/70 | HR 83 | Ht 67.0 in | Wt 145.0 lb

## 2015-06-10 DIAGNOSIS — J439 Emphysema, unspecified: Secondary | ICD-10-CM

## 2015-06-10 DIAGNOSIS — I6523 Occlusion and stenosis of bilateral carotid arteries: Secondary | ICD-10-CM | POA: Diagnosis not present

## 2015-06-10 DIAGNOSIS — E782 Mixed hyperlipidemia: Secondary | ICD-10-CM

## 2015-06-10 DIAGNOSIS — I2510 Atherosclerotic heart disease of native coronary artery without angina pectoris: Secondary | ICD-10-CM

## 2015-06-10 DIAGNOSIS — I10 Essential (primary) hypertension: Secondary | ICD-10-CM

## 2015-06-10 NOTE — Patient Instructions (Signed)
Your physician recommends that you continue on your current medications as directed. Please refer to the Current Medication list given to you today. Your physician recommends that you schedule a follow-up appointment in: 6 months. You will receive a reminder letter in the mail in about 4 months reminding you to call and schedule your appointment. If you don't receive this letter, please contact our office. 

## 2015-06-10 NOTE — Progress Notes (Signed)
Cardiology Office Note  Date: 06/10/2015   ID: Michael [name], DOB [birth year], MRN [id number]  PCP: Curlene Labrum, MD  Primary Cardiologist: Rozann Lesches, MD   Chief Complaint  Patient presents with  . Coronary Artery Disease    History of Present Illness:  Michael [name] is an 82 y.o. male last seen in June 2016. He is here today with his daughter for a follow-up visit. Since last evaluation he does not report any recurrent chest pain, has not had any hospital stays. Activity is fairly limited, he stays at home, gets outdoors occasionally. He reports NYHA class II dyspnea, no palpitations or syncope.  We discussed his medications which are outlined below. Cardiac regimen includes aspirin, Norvasc, Lipitor, Imdur, and as needed nitroglycerin. He states that he has not had to take any nitroglycerin.  Follow-up stress test from last year is outlined below, low risk.  Blood pressure today is well-controlled, he reports compliance with his medications and diet.  Lipids have also been very well controlled. He reports no intolerances to Lipitor. He asked about possibly stopping omega-3 supplements.  Past Medical History  Diagnosis Date  . Hyperthyroidism 1978    Status post radioactive iodine treatment  . History of herpes zoster 2001  . Vitreous hemorrhage [facility]) May 2009    Right eye  . Peripheral arterial disease ([place])   . Mixed hyperlipidemia   . Essential hypertension, benign   . Allergic rhinitis   . COPD (chronic obstructive pulmonary disease) ([place])   . History of asbestos exposure   . Depression   . Anemia   . Carotid artery occlusion   . History of myocardial infarction   . Coronary atherosclerosis of native coronary artery     BMS RCA 2003, DES RCA 2004, DES RCA 2005   . GERD (gastroesophageal reflux disease)   . Hemorrhoids   . Head injury, closed, with brief LOC ([place])   . HOH (hard of hearing)   . Hypothyroidism   . Lumbar pain   . Psoriasis   .  Prostate cancer [facility])     Status post XRT/IMRT 2008 - Dr. Alinda Money  . Renal cancer ([place])     Cryoablation in 2012  . Pneumonia     November 2014    Past Surgical History  Procedure Laterality Date  . Popliteal synovial cyst excision      knee  . Cervical disc surgery  APRIL 2004    fusion  . Partial knee arthroplasty  JULY 2005    Right-replacement  . Nasal septum surgery      SMR  . Neck disk fusion  07/2002  . Knee arthroplasty      bilateral  . Endarterectomy  08/30/2011    Procedure: Left ENDARTERECTOMY CAROTID;  Surgeon: Conrad [place], MD;  Location: [place];  Service: Vascular;  Laterality: Left;  . Joint replacement Right 2005    Right partial Knee  . Joint replacement Left 2011    Left toatal Knee  . Endarterectomy Right 12/18/2012    Procedure: ENDARTERECTOMY CAROTID WITH BOVINE PATCH ANGIOPLASTY;  Surgeon: Conrad [place], MD;  Location: [place];  Service: Vascular;  Laterality: Right;  . Carotid endarterectomy    . Cholecystectomy  1995  . Cardiac catheterization      AUG 2003, AUG 2004, OCT 2005 9 HAS 3 STENTS DONE AT 3 DIFFERENT CATHS - ONE IS DRUG ELUTING STENT; CATH 2006, 2007, 2009 2010  . Eye surgery  2010  bilat cataract.2009-Vitreous Hemorrhage-2009  . Total knee revision Right 08/06/2013    Procedure: REVISION RIGHT TOTAL KNEE ARTHROPLASTY  ;  Surgeon: Gearlean Alf, MD;  [facility];  Service: Orthopedics;  Laterality: Right;    Current Outpatient Prescriptions  Medication Sig Dispense Refill  . acetaminophen (TYLENOL) 650 MG CR tablet Take 1,300 mg by mouth every 8 (eight) hours as needed for pain.    Marland Kitchen albuterol (PROAIR HFA) 108 (90 BASE) MCG/ACT inhaler Inhale 2 puffs into the lungs every 6 (six) hours as needed. For shortness of breath    . amLODipine (NORVASC) 10 MG tablet Take 10 mg by mouth every morning.     Marland Kitchen aspirin 81 MG tablet Take 81 mg by mouth every morning.     Marland Kitchen atorvastatin (LIPITOR) 40 MG tablet Take 40 mg by mouth at bedtime.     .  cetirizine (ZYRTEC) 10 MG tablet Take 10 mg by mouth every morning.     . citalopram (CELEXA) 20 MG tablet Take 20 mg by mouth every morning.     . clobetasol cream (TEMOVATE) 4.85 % Apply 1 application topically daily. Applied to legs for psoriasis    . ferrous sulfate 325 (65 FE) MG tablet Take 325 mg by mouth at bedtime.     . Fluticasone-Salmeterol (ADVAIR DISKUS) 250-50 MCG/DOSE AEPB Inhale 1 puff into the lungs every 12 (twelve) hours.      . isosorbide mononitrate (IMDUR) 60 MG 24 hr tablet TAKE 1 TABLET TWICE A DAY (DOSE INCREASE) 180 tablet 3  . levothyroxine (SYNTHROID, LEVOTHROID) 125 MCG tablet Take 125 mcg by mouth every morning.     . Liniments ([place] ARTHRITIS PAIN RELIEF) PADS Apply 1 each topically as needed (Shoulder Pain).    . Multiple Vitamin (MULTIVITAMIN) tablet Take 1 tablet by mouth every morning.     . nitroGLYCERIN (NITROSTAT) 0.4 MG SL tablet Place 1 tablet (0.4 mg total) under the tongue every 5 (five) minutes as needed. For chest pain 25 tablet 3  . Omega 3 1200 MG CAPS Take 2 capsules by mouth 2 (two) times daily. Take 2 capsules in AM and 2 capsules in  PM    . polyethylene glycol (MIRALAX / GLYCOLAX) packet Take 17 g by mouth daily as needed for mild constipation. 14 each 0  . ranitidine (ZANTAC) 150 MG tablet Take 150 mg by mouth every morning.     . tiotropium (SPIRIVA) 18 MCG inhalation capsule Place 1 capsule (18 mcg total) into inhaler and inhale daily. 30 capsule 0  . traZODone (DESYREL) 100 MG tablet Take 100 mg by mouth at bedtime as needed. For sleep     No current facility-administered medications for this visit.   Allergies:  Hydrocodone-acetaminophen; Oxycodone; Penicillins; Lyrica; Morphine and related; Ciprofloxacin; and Iron   Social History: The patient  reports that he has been smoking Cigarettes.  He started smoking about 71 years ago. He has a 15 pack-year smoking history. He has never used smokeless tobacco. He reports that he does not drink  alcohol or use illicit drugs.   ROS:  Please see the history of present illness. Otherwise, complete review of systems is positive for decreased hearing, arthritic pains, recent cold-like symptoms.  All other systems are reviewed and negative.   Physical Exam: VS:  BP 118/70 mmHg  Pulse 83  Ht '5\' 7"'$  (1.702 m)  Wt 145 lb (65.772 kg)  BMI 22.71 kg/m2  SpO2 93%, BMI Body mass index is 22.71 kg/(m^2).  Wt Readings from Last 3 Encounters:  06/10/15 145 lb (65.772 kg)  02/25/15 153 lb (69.4 kg)  10/29/14 154 lb 3.2 oz (69.945 kg)    General: Elderly male, appears comfortable at rest. HEENT: Conjunctiva and lids normal, oropharynx clear. Neck: Supple, no elevated JVP, bilateral CEA scars, no thyromegaly. Lungs: Diminished breath sounds without wheezing, nonlabored breathing at rest. Cardiac: Regular rate and rhythm, no S3 or significant systolic murmur, no pericardial rub. Abdomen: Soft, nontender, bowel sounds present, no guarding or rebound. Extremities: No pitting edema, distal pulses 2+. Skin: Warm and dry. Musculoskeletal: No kyphosis. Neuropsychiatric: Alert and oriented x3, affect grossly appropriate.  ECG: I personally reviewed the prior tracing from 09/07/2014 which showed sinus rhythm with prolonged PR interval of 220 ms and leftward axis.Marland Kitchen  Recent Labwork: 09/06/2014: ALT 15*; AST 24; BUN 13; Hemoglobin 13.6; Platelets 263; Potassium 3.9; Sodium 135 02/25/2015: Creatinine, Ser 1.30*  October 2015: Cholesterol 122, triglycerides 74, HDL 67, LDL 40  Other Studies Reviewed Today:  Lexiscan cartilage 10/08/2014:  There was no ST segment deviation noted during stress.  The study is normal.  This is a low risk study.  The left ventricular ejection fraction is hyperdynamic (>65%).  Carotid Dopplers 02/20/2014: Widely patent bilateral CEA sites  Assessment and Plan:  1. CAD status post multiple interventions to the RCA, most recently DES in 2005. He has had no recurrent  chest pain since last evaluation or nitroglycerin requirement. Follow-up Cardiolite from last June was low risk with normal LVEF. He is to continue medical therapy and observation.  2. Essential hypertension, blood pressure is well controlled today. Continue Norvasc.  3. Hyperlipidemia, on Lipitor and omega-3 supplement. Last lipid panel outlined above showed aggressive LDL control and normal triglycerides. He asked about possibly stopping the omega-3 supplements. I think this is reasonable, certainly if his lipid panel looks the same when he follows up later this year with Dr. Pleas Koch.  4. Bilateral carotid artery disease status post CEA. Follow-up carotid Dopplers are already ordered for later this year.  5. COPD, followed by Dr. Annamaria Boots. We do not have him on beta blocker therapy for this reason.  Current medicines were reviewed with the patient today.  Disposition: FU with me in 6 months.   Signed, Satira Sark, MD, [facility] 06/10/2015 8:39 AM    [facility] [address], [place], [place] [address] Phone: [phone]; Fax: [phone]

## 2015-06-16 DIAGNOSIS — M545 Low back pain: Secondary | ICD-10-CM | POA: Diagnosis not present

## 2015-06-16 DIAGNOSIS — N281 Cyst of kidney, acquired: Secondary | ICD-10-CM | POA: Diagnosis not present

## 2015-06-30 NOTE — Therapy (Signed)
[facility] [place], [place], [address] Phone: [phone]   Fax:  [phone]  Physical Therapy Treatment  Patient Details  Name: Michael [name] MRN: [id number] Date of Birth: 15-Jan-1932 No Data Recorded  Encounter Date: 10/06/2014    Past Medical History  Diagnosis Date  . Hyperthyroidism 1978    Status post radioactive iodine treatment  . History of herpes zoster 2001  . Vitreous hemorrhage [facility]) May 2009    Right eye  . Peripheral arterial disease ([place])   . Mixed hyperlipidemia   . Essential hypertension, benign   . Allergic rhinitis   . COPD (chronic obstructive pulmonary disease) ([place])   . History of asbestos exposure   . Depression   . Anemia   . Carotid artery occlusion   . History of myocardial infarction   . Coronary atherosclerosis of native coronary artery     BMS RCA 2003, DES RCA 2004, DES RCA 2005   . GERD (gastroesophageal reflux disease)   . Hemorrhoids   . Head injury, closed, with brief LOC ([place])   . HOH (hard of hearing)   . Hypothyroidism   . Lumbar pain   . Psoriasis   . Prostate cancer [facility])     Status post XRT/IMRT 2008 - Dr. Alinda Money  . Renal cancer ([place])     Cryoablation in 2012  . Pneumonia     November 2014    Past Surgical History  Procedure Laterality Date  . Popliteal synovial cyst excision      knee  . Cervical disc surgery  APRIL 2004    fusion  . Partial knee arthroplasty  JULY 2005    Right-replacement  . Nasal septum surgery      SMR  . Neck disk fusion  07/2002  . Knee arthroplasty      bilateral  . Endarterectomy  08/30/2011    Procedure: Left ENDARTERECTOMY CAROTID;  Surgeon: Conrad Reklaw, MD;  Location: [place];  Service: Vascular;  Laterality: Left;  . Joint replacement Right 2005    Right partial Knee  . Joint replacement Left 2011    Left toatal Knee  . Endarterectomy Right 12/18/2012    Procedure: ENDARTERECTOMY CAROTID WITH BOVINE PATCH ANGIOPLASTY;   Surgeon: Conrad , MD;  Location: [place];  Service: Vascular;  Laterality: Right;  . Carotid endarterectomy    . Cholecystectomy  1995  . Cardiac catheterization      AUG 2003, AUG 2004, OCT 2005 9 HAS 3 STENTS DONE AT 3 DIFFERENT CATHS - ONE IS DRUG ELUTING STENT; CATH 2006, 2007, 2009 2010  . Eye surgery  2010    bilat cataract.2009-Vitreous Hemorrhage-2009  . Total knee revision Right 08/06/2013    Procedure: REVISION RIGHT TOTAL KNEE ARTHROPLASTY  ;  Surgeon: Gearlean Alf, MD;  [facility];  Service: Orthopedics;  Laterality: Right;    There were no vitals filed for this visit.  Visit Diagnosis:  Right shoulder pain  Shoulder stiffness, right                                 PT Short Term Goals - 10/01/14 1429    PT SHORT TERM GOAL #1   Title Independent with HEP.   Time 2   Period Weeks   Status Achieved           PT Long Term Goals - 09/24/14 1110  PT LONG TERM GOAL #1   Title Active right shoulder flexion to 145 degrees so the patient can easily reach overhead   Time 6   Period Weeks   Status New   PT LONG TERM GOAL #2   Title Active ER to 60 degrees+ to allow for easily donning/doffing of apparel   Time 6   Period Weeks   Status New   PT LONG TERM GOAL #3   Title Increase ROM so patient is able to reach behind back to L3 with right index finger.   Time 6   Period Weeks   Status New   PT LONG TERM GOAL #4   Title Increase right shoulder strength to 4+/5 so he can more easily complete functional activities that require anti-gravity movement.   Time 6   Period Weeks   Status New               Problem List Patient Active Problem List   Diagnosis Date Noted  . Adrenal gland cyst ([place])   . Primary renal papillary carcinoma ([place])   . Acute chest pain 09/06/2014  . Chest pain at rest   . Essential hypertension   . Dyslipidemia   . Other specified hypothyroidism   . Carotid stenosis 02/20/2014  . Chest pain  02/15/2014  . Postoperative anemia due to acute blood loss 08/07/2013  . Pain due to unicompartmental arthroplasty of knee ([place]) 08/06/2013  . Preoperative cardiovascular examination 06/18/2013  . Carotid stenosis, bilateral 08/04/2011  . Carotid artery obstruction 02/03/2011  . TOBACCO USER 10/08/2009  . Clinical depression 02/25/2009  . Mixed hyperlipidemia 12/22/2008  . Essential hypertension, benign 12/22/2008  . CAD, NATIVE VESSEL 12/22/2008  . PVD 12/22/2008  . Essential (primary) hypertension 12/22/2008  . HLD (hyperlipidemia) 12/22/2008  . Peripheral blood vessel disorder ([place]) 12/22/2008  . COPD with emphysema ([place]) 11/29/2007  . ASBESTOS EXPOSURE, HX OF 11/29/2007   PHYSICAL THERAPY DISCHARGE SUMMARY  Visits from Start of Care: 4  Current functional level related to goals / functional outcomes: Please see above.   Remaining deficits: Continued right shoulder pain and loss of ROM and strength.   Education / Equipment: HEP.  Plan: Patient agrees to discharge.  Patient goals were not met. Patient is being discharged due to not returning since the last visit.  ?????      Mohd. Derflinger, [place] MPT 06/30/2015, 5:41 PM  [facility] [address] [place], [place], [address] Phone: [phone]   Fax:  [phone]  Name: Michael [name] MRN: [id number] Date of Birth: 11-14-1931

## 2015-07-02 ENCOUNTER — Inpatient Hospital Stay (HOSPITAL_COMMUNITY)
Admission: EM | Admit: 2015-07-02 | Discharge: 2015-07-03 | DRG: 287 | Disposition: A | Discharge status: Home / Self Care | Payer: Medicare Other | Attending: Cardiology | Admitting: Cardiology

## 2015-07-02 ENCOUNTER — Ambulatory Visit (HOSPITAL_COMMUNITY): Admit: 2015-07-02 | Payer: Self-pay | Admitting: Interventional Cardiology

## 2015-07-02 ENCOUNTER — Encounter (HOSPITAL_COMMUNITY)
Admission: EM | Disposition: A | Discharge status: Home / Self Care | Payer: Self-pay | Source: Home / Self Care | Attending: Cardiology

## 2015-07-02 ENCOUNTER — Encounter (HOSPITAL_COMMUNITY): Payer: Self-pay | Admitting: Emergency Medicine

## 2015-07-02 ENCOUNTER — Emergency Department (HOSPITAL_COMMUNITY): Payer: Medicare Other

## 2015-07-02 DIAGNOSIS — J449 Chronic obstructive pulmonary disease, unspecified: Secondary | ICD-10-CM | POA: Diagnosis not present

## 2015-07-02 DIAGNOSIS — F17210 Nicotine dependence, cigarettes, uncomplicated: Secondary | ICD-10-CM | POA: Diagnosis present

## 2015-07-02 DIAGNOSIS — I440 Atrioventricular block, first degree: Secondary | ICD-10-CM | POA: Diagnosis present

## 2015-07-02 DIAGNOSIS — I252 Old myocardial infarction: Secondary | ICD-10-CM | POA: Diagnosis not present

## 2015-07-02 DIAGNOSIS — I2510 Atherosclerotic heart disease of native coronary artery without angina pectoris: Secondary | ICD-10-CM | POA: Diagnosis present

## 2015-07-02 DIAGNOSIS — I739 Peripheral vascular disease, unspecified: Secondary | ICD-10-CM | POA: Diagnosis present

## 2015-07-02 DIAGNOSIS — Z885 Allergy status to narcotic agent status: Secondary | ICD-10-CM

## 2015-07-02 DIAGNOSIS — L409 Psoriasis, unspecified: Secondary | ICD-10-CM | POA: Diagnosis present

## 2015-07-02 DIAGNOSIS — Z888 Allergy status to other drugs, medicaments and biological substances status: Secondary | ICD-10-CM | POA: Diagnosis not present

## 2015-07-02 DIAGNOSIS — R079 Chest pain, unspecified: Secondary | ICD-10-CM | POA: Diagnosis not present

## 2015-07-02 DIAGNOSIS — H9190 Unspecified hearing loss, unspecified ear: Secondary | ICD-10-CM | POA: Diagnosis present

## 2015-07-02 DIAGNOSIS — Z880 Allergy status to penicillin: Secondary | ICD-10-CM | POA: Diagnosis not present

## 2015-07-02 DIAGNOSIS — I200 Unstable angina: Secondary | ICD-10-CM | POA: Diagnosis not present

## 2015-07-02 DIAGNOSIS — T82855A Stenosis of coronary artery stent, initial encounter: Secondary | ICD-10-CM | POA: Diagnosis present

## 2015-07-02 DIAGNOSIS — Z809 Family history of malignant neoplasm, unspecified: Secondary | ICD-10-CM | POA: Diagnosis not present

## 2015-07-02 DIAGNOSIS — I25110 Atherosclerotic heart disease of native coronary artery with unstable angina pectoris: Secondary | ICD-10-CM | POA: Diagnosis not present

## 2015-07-02 DIAGNOSIS — F329 Major depressive disorder, single episode, unspecified: Secondary | ICD-10-CM | POA: Diagnosis present

## 2015-07-02 DIAGNOSIS — Z77090 Contact with and (suspected) exposure to asbestos: Secondary | ICD-10-CM

## 2015-07-02 DIAGNOSIS — Z79899 Other long term (current) drug therapy: Secondary | ICD-10-CM | POA: Diagnosis not present

## 2015-07-02 DIAGNOSIS — Z923 Personal history of irradiation: Secondary | ICD-10-CM | POA: Diagnosis not present

## 2015-07-02 DIAGNOSIS — Z881 Allergy status to other antibiotic agents status: Secondary | ICD-10-CM | POA: Diagnosis not present

## 2015-07-02 DIAGNOSIS — E039 Hypothyroidism, unspecified: Secondary | ICD-10-CM | POA: Diagnosis present

## 2015-07-02 DIAGNOSIS — R0602 Shortness of breath: Secondary | ICD-10-CM | POA: Diagnosis not present

## 2015-07-02 DIAGNOSIS — Z7951 Long term (current) use of inhaled steroids: Secondary | ICD-10-CM

## 2015-07-02 DIAGNOSIS — Z7982 Long term (current) use of aspirin: Secondary | ICD-10-CM

## 2015-07-02 DIAGNOSIS — K219 Gastro-esophageal reflux disease without esophagitis: Secondary | ICD-10-CM | POA: Diagnosis present

## 2015-07-02 DIAGNOSIS — Z96653 Presence of artificial knee joint, bilateral: Secondary | ICD-10-CM | POA: Diagnosis present

## 2015-07-02 DIAGNOSIS — Z85528 Personal history of other malignant neoplasm of kidney: Secondary | ICD-10-CM | POA: Diagnosis not present

## 2015-07-02 DIAGNOSIS — Z8249 Family history of ischemic heart disease and other diseases of the circulatory system: Secondary | ICD-10-CM | POA: Diagnosis not present

## 2015-07-02 DIAGNOSIS — Z8546 Personal history of malignant neoplasm of prostate: Secondary | ICD-10-CM

## 2015-07-02 DIAGNOSIS — E782 Mixed hyperlipidemia: Secondary | ICD-10-CM | POA: Diagnosis present

## 2015-07-02 DIAGNOSIS — I10 Essential (primary) hypertension: Secondary | ICD-10-CM | POA: Diagnosis present

## 2015-07-02 DIAGNOSIS — E785 Hyperlipidemia, unspecified: Secondary | ICD-10-CM | POA: Diagnosis present

## 2015-07-02 DIAGNOSIS — J439 Emphysema, unspecified: Secondary | ICD-10-CM | POA: Diagnosis present

## 2015-07-02 HISTORY — DX: Anxiety disorder, unspecified: F41.9

## 2015-07-02 HISTORY — PX: CARDIAC CATHETERIZATION: SHX172

## 2015-07-02 LAB — I-STAT TROPONIN, ED
TROPONIN I, POC: 0 ng/mL (ref 0.00–0.08)
Troponin i, poc: 0.01 ng/mL (ref 0.00–0.08)

## 2015-07-02 LAB — CBC
HCT: 41.8 % (ref 39.0–52.0)
HCT: 36.4 % — ABNORMAL LOW (ref 39.0–52.0)
HEMOGLOBIN: 14.2 g/dL (ref 13.0–17.0)
Hemoglobin: 12 g/dL — ABNORMAL LOW (ref 13.0–17.0)
MCH: 32.9 pg (ref 26.0–34.0)
MCH: 31.8 pg (ref 26.0–34.0)
MCHC: 34 g/dL (ref 30.0–36.0)
MCHC: 33 g/dL (ref 30.0–36.0)
MCV: 97 fL (ref 78.0–100.0)
MCV: 96.6 fL (ref 78.0–100.0)
PLATELETS: 273 10*3/uL (ref 150–400)
Platelets: 287 10*3/uL (ref 150–400)
RBC: 4.31 MIL/uL (ref 4.22–5.81)
RBC: 3.77 MIL/uL — ABNORMAL LOW (ref 4.22–5.81)
RDW: 15.8 % — ABNORMAL HIGH (ref 11.5–15.5)
RDW: 15.7 % — AB (ref 11.5–15.5)
WBC: 10 10*3/uL (ref 4.0–10.5)
WBC: 6.9 10*3/uL (ref 4.0–10.5)

## 2015-07-02 LAB — CREATININE, SERUM
CREATININE: 0.94 mg/dL (ref 0.61–1.24)
GFR calc Af Amer: >60 mL/min (ref >60)
GFR calc non Af Amer: >60 mL/min (ref >60)

## 2015-07-02 LAB — BASIC METABOLIC PANEL
Anion gap: 8 (ref 5–15)
BUN: 11 mg/dL (ref 6–20)
CHLORIDE: 98 mmol/L — AB (ref 101–111)
CO2: 28 mmol/L (ref 22–32)
Calcium: 9.4 mg/dL (ref 8.9–10.3)
Creatinine, Ser: 1.02 mg/dL (ref 0.61–1.24)
GFR calc non Af Amer: >60 mL/min (ref >60)
Glucose, Bld: 123 mg/dL — ABNORMAL HIGH (ref 65–99)
POTASSIUM: 4 mmol/L (ref 3.5–5.1)
SODIUM: 134 mmol/L — AB (ref 135–145)

## 2015-07-02 LAB — PROTIME-INR
INR: 1.12 (ref 0.00–1.49)
PROTHROMBIN TIME: 14.6 s (ref 11.6–15.2)

## 2015-07-02 LAB — D-DIMER, QUANTITATIVE: D-Dimer, Quant: 0.59 ug/mL-FEU — ABNORMAL HIGH (ref 0.00–0.50)

## 2015-07-02 LAB — TROPONIN I: Troponin I: <0.03 ng/mL (ref <0.031)

## 2015-07-02 SURGERY — LEFT HEART CATH AND CORONARY ANGIOGRAPHY
Anesthesia: LOCAL

## 2015-07-02 MED ORDER — ONDANSETRON HCL 4 MG/2ML IJ SOLN: 4.0000 mg | Freq: Four times a day (QID) | INTRAMUSCULAR | Status: DC | PRN

## 2015-07-02 MED ORDER — SODIUM CHLORIDE 0.9% FLUSH: 3.0000 mL | INTRAVENOUS | Status: DC | PRN

## 2015-07-02 MED ORDER — FAMOTIDINE 20 MG PO TABS
20.0000 mg | ORAL_TABLET | Freq: Every day | ORAL | Status: DC
  Administered 2015-07-02 – 2015-07-03 (×2): 20 mg via ORAL
  Filled 2015-07-02 (×3): qty 1

## 2015-07-02 MED ORDER — SODIUM CHLORIDE 0.9 % IV SOLN: INTRAVENOUS | Status: DC

## 2015-07-02 MED ORDER — SODIUM CHLORIDE 0.9 % IV SOLN
INTRAVENOUS | Status: DC | PRN
  Administered 2015-07-02: 250 mL via INTRAVENOUS

## 2015-07-02 MED ORDER — LEVOTHYROXINE SODIUM 125 MCG PO TABS: 125.0000 ug | ORAL_TABLET | Freq: Every morning | ORAL | Status: DC

## 2015-07-02 MED ORDER — VERAPAMIL HCL 2.5 MG/ML IV SOLN
INTRAVENOUS | Status: DC | PRN
  Administered 2015-07-02: 10 mL via INTRA_ARTERIAL

## 2015-07-02 MED ORDER — NITROGLYCERIN 0.4 MG SL SUBL
0.4000 mg | SUBLINGUAL_TABLET | Freq: Once | SUBLINGUAL | Status: AC
  Administered 2015-07-02: 0.4 mg via SUBLINGUAL
  Filled 2015-07-02: qty 1

## 2015-07-02 MED ORDER — IOHEXOL 350 MG/ML SOLN
INTRAVENOUS | Status: DC | PRN
  Administered 2015-07-02: 55 mL via INTRAVENOUS

## 2015-07-02 MED ORDER — VERAPAMIL HCL 2.5 MG/ML IV SOLN
INTRAVENOUS | Status: AC
  Filled 2015-07-02: qty 2

## 2015-07-02 MED ORDER — LIDOCAINE HCL (PF) 1 % IJ SOLN
INTRAMUSCULAR | Status: DC | PRN
  Administered 2015-07-02: 5 mL

## 2015-07-02 MED ORDER — FENTANYL CITRATE (PF) 100 MCG/2ML IJ SOLN
INTRAMUSCULAR | Status: AC
  Filled 2015-07-02: qty 2

## 2015-07-02 MED ORDER — SODIUM CHLORIDE 0.9% FLUSH: 3.0000 mL | Freq: Two times a day (BID) | INTRAVENOUS | Status: DC

## 2015-07-02 MED ORDER — ADULT MULTIVITAMIN W/MINERALS CH
1.0000 | ORAL_TABLET | Freq: Every morning | ORAL | Status: DC
  Administered 2015-07-03: 1 via ORAL
  Filled 2015-07-02: qty 1

## 2015-07-02 MED ORDER — ONDANSETRON HCL 4 MG/2ML IJ SOLN
4.0000 mg | Freq: Once | INTRAMUSCULAR | Status: AC
  Administered 2015-07-02: 4 mg via INTRAVENOUS
  Filled 2015-07-02: qty 2

## 2015-07-02 MED ORDER — HEPARIN (PORCINE) IN NACL 100-0.45 UNIT/ML-% IJ SOLN: 850.0000 [IU]/h | INTRAMUSCULAR | Status: DC

## 2015-07-02 MED ORDER — HEPARIN BOLUS VIA INFUSION: 3000.0000 [IU] | Freq: Once | INTRAVENOUS | Status: DC

## 2015-07-02 MED ORDER — ALBUTEROL SULFATE (2.5 MG/3ML) 0.083% IN NEBU: 2.5000 mg | INHALATION_SOLUTION | Freq: Four times a day (QID) | RESPIRATORY_TRACT | Status: DC | PRN

## 2015-07-02 MED ORDER — TIOTROPIUM BROMIDE MONOHYDRATE 18 MCG IN CAPS
18.0000 ug | ORAL_CAPSULE | Freq: Every day | RESPIRATORY_TRACT | Status: DC
  Administered 2015-07-02 – 2015-07-03 (×2): 18 ug via RESPIRATORY_TRACT
  Filled 2015-07-02: qty 5

## 2015-07-02 MED ORDER — ASPIRIN 81 MG PO TABS: 81.0000 mg | ORAL_TABLET | Freq: Every morning | ORAL | Status: DC

## 2015-07-02 MED ORDER — OMEGA-3-ACID ETHYL ESTERS 1 G PO CAPS
1.0000 g | ORAL_CAPSULE | Freq: Two times a day (BID) | ORAL | Status: DC
  Administered 2015-07-02 – 2015-07-03 (×2): 1 g via ORAL
  Filled 2015-07-02 (×2): qty 1

## 2015-07-02 MED ORDER — HEPARIN SODIUM (PORCINE) 1000 UNIT/ML IJ SOLN
INTRAMUSCULAR | Status: DC | PRN
  Administered 2015-07-02: 4000 [IU] via INTRAVENOUS

## 2015-07-02 MED ORDER — OMEGA-3 FISH OIL 1200 MG PO CAPS: 2400.0000 mg | ORAL_CAPSULE | Freq: Two times a day (BID) | ORAL | Status: DC

## 2015-07-02 MED ORDER — ALBUTEROL SULFATE HFA 108 (90 BASE) MCG/ACT IN AERS: 2.0000 | INHALATION_SPRAY | Freq: Four times a day (QID) | RESPIRATORY_TRACT | Status: DC | PRN

## 2015-07-02 MED ORDER — NITROGLYCERIN 0.4 MG SL SUBL: 0.4000 mg | SUBLINGUAL_TABLET | SUBLINGUAL | Status: DC | PRN

## 2015-07-02 MED ORDER — TIOTROPIUM BROMIDE MONOHYDRATE 18 MCG IN CAPS
18.0000 ug | ORAL_CAPSULE | Freq: Every day | RESPIRATORY_TRACT | Status: DC
  Filled 2015-07-02: qty 5

## 2015-07-02 MED ORDER — ASPIRIN 81 MG PO CHEW: 81.0000 mg | CHEWABLE_TABLET | ORAL | Status: DC

## 2015-07-02 MED ORDER — LIDOCAINE HCL (PF) 1 % IJ SOLN
INTRAMUSCULAR | Status: AC
  Filled 2015-07-02: qty 30

## 2015-07-02 MED ORDER — ATORVASTATIN CALCIUM 40 MG PO TABS
40.0000 mg | ORAL_TABLET | Freq: Every day | ORAL | Status: DC
  Administered 2015-07-02: 40 mg via ORAL
  Filled 2015-07-02: qty 1

## 2015-07-02 MED ORDER — CITALOPRAM HYDROBROMIDE 20 MG PO TABS
20.0000 mg | ORAL_TABLET | Freq: Every morning | ORAL | Status: DC
  Administered 2015-07-02: 21:00:00 20 mg via ORAL
  Filled 2015-07-02: qty 1

## 2015-07-02 MED ORDER — SODIUM CHLORIDE 0.9 % IV SOLN: 250.0000 mL | INTRAVENOUS | Status: DC | PRN

## 2015-07-02 MED ORDER — OMEGA 3 1200 MG PO CAPS: 2.0000 | ORAL_CAPSULE | Freq: Two times a day (BID) | ORAL | Status: DC

## 2015-07-02 MED ORDER — HEPARIN (PORCINE) IN NACL 2-0.9 UNIT/ML-% IJ SOLN
INTRAMUSCULAR | Status: AC
  Filled 2015-07-02: qty 1500

## 2015-07-02 MED ORDER — FENTANYL CITRATE (PF) 100 MCG/2ML IJ SOLN
INTRAMUSCULAR | Status: DC | PRN
  Administered 2015-07-02: 25 ug via INTRAVENOUS

## 2015-07-02 MED ORDER — HEPARIN SODIUM (PORCINE) 1000 UNIT/ML IJ SOLN
INTRAMUSCULAR | Status: AC
  Filled 2015-07-02: qty 1

## 2015-07-02 MED ORDER — SODIUM CHLORIDE 0.9 % WEIGHT BASED INFUSION: 1.0000 mL/kg/h | INTRAVENOUS | Status: AC

## 2015-07-02 MED ORDER — MIDAZOLAM HCL 2 MG/2ML IJ SOLN
INTRAMUSCULAR | Status: AC
  Filled 2015-07-02: qty 2

## 2015-07-02 MED ORDER — FERROUS SULFATE 325 (65 FE) MG PO TABS
325.0000 mg | ORAL_TABLET | Freq: Every day | ORAL | Status: DC
  Administered 2015-07-02: 21:00:00 325 mg via ORAL
  Filled 2015-07-02: qty 1

## 2015-07-02 MED ORDER — ASPIRIN 81 MG PO CHEW
324.0000 mg | CHEWABLE_TABLET | Freq: Once | ORAL | Status: AC
  Administered 2015-07-02: 324 mg via ORAL

## 2015-07-02 MED ORDER — NITROGLYCERIN 0.4 MG SL SUBL
0.4000 mg | SUBLINGUAL_TABLET | Freq: Once | SUBLINGUAL | Status: AC
  Administered 2015-07-02: 0.4 mg via SUBLINGUAL

## 2015-07-02 MED ORDER — MIDAZOLAM HCL 2 MG/2ML IJ SOLN
INTRAMUSCULAR | Status: DC | PRN
  Administered 2015-07-02: 1 mg via INTRAVENOUS

## 2015-07-02 MED ORDER — HEPARIN (PORCINE) IN NACL 2-0.9 UNIT/ML-% IJ SOLN
INTRAMUSCULAR | Status: DC | PRN
  Administered 2015-07-02: 1500 mL

## 2015-07-02 MED ORDER — ASPIRIN 81 MG PO CHEW
CHEWABLE_TABLET | ORAL | Status: AC
  Filled 2015-07-02: qty 4

## 2015-07-02 MED ORDER — NITROGLYCERIN 2 % TD OINT
1.0000 [in_us] | TOPICAL_OINTMENT | Freq: Once | TRANSDERMAL | Status: AC
  Administered 2015-07-02: 1 [in_us] via TOPICAL
  Filled 2015-07-02: qty 1

## 2015-07-02 MED ORDER — HEPARIN SODIUM (PORCINE) 5000 UNIT/ML IJ SOLN
5000.0000 [IU] | Freq: Three times a day (TID) | INTRAMUSCULAR | Status: DC
  Administered 2015-07-03: 5000 [IU] via SUBCUTANEOUS
  Filled 2015-07-02: qty 1

## 2015-07-02 MED ORDER — AMLODIPINE BESYLATE 5 MG PO TABS
5.0000 mg | ORAL_TABLET | Freq: Every morning | ORAL | Status: DC
  Administered 2015-07-03: 5 mg via ORAL
  Filled 2015-07-02 (×2): qty 1

## 2015-07-02 MED ORDER — MORPHINE SULFATE (PF) 4 MG/ML IV SOLN
INTRAVENOUS | Status: AC
  Filled 2015-07-02: qty 1

## 2015-07-02 MED ORDER — LEVOTHYROXINE SODIUM 125 MCG PO TABS
125.0000 ug | ORAL_TABLET | Freq: Every day | ORAL | Status: DC
  Administered 2015-07-03: 125 ug via ORAL
  Filled 2015-07-02 (×2): qty 1

## 2015-07-02 MED ORDER — TRAZODONE HCL 100 MG PO TABS
100.0000 mg | ORAL_TABLET | Freq: Every evening | ORAL | Status: DC | PRN
  Filled 2015-07-02: qty 1

## 2015-07-02 MED ORDER — MORPHINE SULFATE (PF) 4 MG/ML IV SOLN
4.0000 mg | Freq: Once | INTRAVENOUS | Status: AC
  Administered 2015-07-02: 4 mg via INTRAVENOUS

## 2015-07-02 MED ORDER — ACETAMINOPHEN 325 MG PO TABS
650.0000 mg | ORAL_TABLET | ORAL | Status: DC | PRN
  Administered 2015-07-02: 650 mg via ORAL
  Filled 2015-07-02: qty 2

## 2015-07-02 MED ORDER — NITROGLYCERIN IN D5W 200-5 MCG/ML-% IV SOLN
0.0000 ug/min | INTRAVENOUS | Status: DC
  Filled 2015-07-02: qty 250

## 2015-07-02 MED ORDER — ASPIRIN EC 81 MG PO TBEC
81.0000 mg | DELAYED_RELEASE_TABLET | Freq: Every day | ORAL | Status: DC
  Administered 2015-07-03: 81 mg via ORAL
  Filled 2015-07-02: qty 1

## 2015-07-02 SURGICAL SUPPLY — 12 items
CATH INFINITI 5 FR JL3.5 (CATHETERS) ×1 IMPLANT
CATH INFINITI 5FR ANG PIGTAIL (CATHETERS) ×1 IMPLANT
CATH INFINITI JR4 5F (CATHETERS) ×1 IMPLANT
DEVICE RAD COMP TR BAND LRG (VASCULAR PRODUCTS) ×1 IMPLANT
GLIDESHEATH INTRODUCER 6F 10CM (SHEATH) ×1 IMPLANT
GLIDESHEATH SLEND SS 6F .021 (SHEATH) IMPLANT
KIT HEART LEFT (KITS) ×2 IMPLANT
PACK CARDIAC CATHETERIZATION (CUSTOM PROCEDURE TRAY) ×2 IMPLANT
SYR MEDRAD MARK V 150ML (SYRINGE) ×2 IMPLANT
TRANSDUCER W/STOPCOCK (MISCELLANEOUS) ×2 IMPLANT
TUBING CIL FLEX 10 FLL-RA (TUBING) ×2 IMPLANT
WIRE SAFE-T 1.5MM-J .035X260CM (WIRE) ×1 IMPLANT

## 2015-07-02 NOTE — H&P (View-Only) (Signed)
CARDIOLOGY CONSULT NOTE   Patient ID: Michael [name] MRN: [id number] DOB/AGE: January 09, 1932 82 y.o.  Admit Date: 07/02/2015 Referring Physician: ER - Milton Ferguson, MD  Primary Physician: Curlene Labrum, MD Consulting Cardiologist: Rozann Lesches MD Primary Cardiologist: Rozann Lesches MD Reason for Consultation: Chest Pain with known CAD  Clinical Summary Michael [name] is an 82 y.o.male with known history of coronary artery disease status post multiple interventions to the RCA, most recently DES in 2005, hypertension, hyperlipidemia, COPD, and carotid artery disease. He was seen in the office back in early February and clinically stable at that time on medical therapy. Cardiolite study from June 2016 was low risk.  The patient was in his usual state of health until last night around 8 PM, when he began to feel left-sided chest "pressure, squeezing feeling," nonradiating, without associated shortness breath, dizziness, or diaphoresis. Patient states he went to bed and was able to sleep all night. When he awoke this morning he continued to have the left-sided chest pressure and tightness. He to the nitroglycerin with mild improvement but it did not abate. He decided to call his primary care physician to discuss this, but his son who came to visit him, felt to be best to bring him to the emergency room.  On arrival to the emergency room, patient's blood pressure was 108/63, heart rate 67, O2 sat 95%. Labs on arrival reveal a sodium of 134, potassium 4.0, chloride 98, glucose 123, creatinine 1.02. Troponin 0.01. Chest x-ray revealed no active disease, hyperinflation was noted, stable fibrotic changes in the lung bases. ECG revealed normal sinus rhythm, first-degree AV block, rate of 69 bpm. He was given aspirin 324 mg daily, nitroglycerin sublingual, and a 2%. Nitroglycerin paste was placed topically. He reports some improvement with these interventions, but still has chest discomfort. It is not  worsened with movement, breathing, or palpation.  He states he had a recent cold with cough. The symptoms have improved. He cannot entirely recall previous angina symptoms with his last intervention.  Allergies  Allergen Reactions  . Hydrocodone-Acetaminophen Rash and Other (See Comments)    Confusion  . Oxycodone Hives  . Penicillins Hives  . Lyrica [Pregabalin] Other (See Comments)  . Morphine And Related Other (See Comments)  . Ciprofloxacin Nausea Only  . Iron Itching    Medications No current facility-administered medications on file prior to encounter.   Current Outpatient Prescriptions on File Prior to Encounter  Medication Sig Dispense Refill  . acetaminophen (TYLENOL) 650 MG CR tablet Take 1,300 mg by mouth every 8 (eight) hours as needed for pain.    Marland Kitchen albuterol (PROAIR HFA) 108 (90 BASE) MCG/ACT inhaler Inhale 2 puffs into the lungs every 6 (six) hours as needed. For shortness of breath    . amLODipine (NORVASC) 10 MG tablet Take 10 mg by mouth every morning.     Marland Kitchen aspirin 81 MG tablet Take 81 mg by mouth every morning.     Marland Kitchen atorvastatin (LIPITOR) 40 MG tablet Take 40 mg by mouth at bedtime.     . cetirizine (ZYRTEC) 10 MG tablet Take 10 mg by mouth every morning.     . citalopram (CELEXA) 20 MG tablet Take 20 mg by mouth every morning.     . clobetasol cream (TEMOVATE) 1.01 % Apply 1 application topically daily. Applied to legs for psoriasis    . ferrous sulfate 325 (65 FE) MG tablet Take 325 mg by mouth at bedtime.     . Fluticasone-Salmeterol (  ADVAIR DISKUS) 250-50 MCG/DOSE AEPB Inhale 1 puff into the lungs every 12 (twelve) hours.      . isosorbide mononitrate (IMDUR) 60 MG 24 hr tablet TAKE 1 TABLET TWICE A DAY (DOSE INCREASE) 180 tablet 3  . levothyroxine (SYNTHROID, LEVOTHROID) 125 MCG tablet Take 125 mcg by mouth every morning.     . Liniments (SALONPAS ARTHRITIS PAIN RELIEF) PADS Apply 1 each topically as needed (Shoulder Pain).    . Multiple Vitamin  (MULTIVITAMIN) tablet Take 1 tablet by mouth every morning.     . nitroGLYCERIN (NITROSTAT) 0.4 MG SL tablet Place 1 tablet (0.4 mg total) under the tongue every 5 (five) minutes as needed. For chest pain 25 tablet 3  . Omega 3 1200 MG CAPS Take 2 capsules by mouth 2 (two) times daily. Take 2 capsules in AM and 2 capsules in  PM    . polyethylene glycol (MIRALAX / GLYCOLAX) packet Take 17 g by mouth daily as needed for mild constipation. 14 each 0  . ranitidine (ZANTAC) 150 MG tablet Take 150 mg by mouth every morning.     . tiotropium (SPIRIVA) 18 MCG inhalation capsule Place 1 capsule (18 mcg total) into inhaler and inhale daily. 30 capsule 0  . traZODone (DESYREL) 100 MG tablet Take 100 mg by mouth at bedtime as needed. For sleep      Past Medical History  Diagnosis Date  . Hyperthyroidism 1978    Status post radioactive iodine treatment  . History of herpes zoster 2001  . Vitreous hemorrhage [facility]) May 2009    Right eye  . Peripheral arterial disease ([place])   . Mixed hyperlipidemia   . Essential hypertension, benign   . Allergic rhinitis   . COPD (chronic obstructive pulmonary disease) ([place])   . History of asbestos exposure   . Depression   . Anemia   . Carotid artery occlusion   . History of myocardial infarction   . Coronary atherosclerosis of native coronary artery     BMS RCA 2003, DES RCA 2004, DES RCA 2005   . GERD (gastroesophageal reflux disease)   . Hemorrhoids   . Head injury, closed, with brief LOC ([place])   . HOH (hard of hearing)   . Hypothyroidism   . Lumbar pain   . Psoriasis   . Prostate cancer [facility])     Status post XRT/IMRT 2008 - Dr. Alinda Money  . Renal cancer ([place])     Cryoablation in 2012  . Pneumonia     November 2014    Past Surgical History  Procedure Laterality Date  . Popliteal synovial cyst excision      knee  . Cervical disc surgery  APRIL 2004    fusion  . Partial knee arthroplasty  JULY 2005    Right-replacement  . Nasal septum surgery       SMR  . Neck disk fusion  07/2002  . Knee arthroplasty      bilateral  . Endarterectomy  08/30/2011    Procedure: Left ENDARTERECTOMY CAROTID;  Surgeon: Conrad Welch, MD;  Location: [place];  Service: Vascular;  Laterality: Left;  . Joint replacement Right 2005    Right partial Knee  . Joint replacement Left 2011    Left toatal Knee  . Endarterectomy Right 12/18/2012    Procedure: ENDARTERECTOMY CAROTID WITH BOVINE PATCH ANGIOPLASTY;  Surgeon: Conrad [place], MD;  Location: [place];  Service: Vascular;  Laterality: Right;  . Carotid endarterectomy    . Cholecystectomy  1995  . Cardiac catheterization      AUG 2003, AUG 2004, OCT 2005 9 HAS 3 STENTS DONE AT 3 DIFFERENT CATHS - ONE IS DRUG ELUTING STENT; CATH 2006, 2007, 2009 2010  . Eye surgery  2010    bilat cataract.2009-Vitreous Hemorrhage-2009  . Total knee revision Right 08/06/2013    Procedure: REVISION RIGHT TOTAL KNEE ARTHROPLASTY  ;  Surgeon: Gearlean Alf, MD;  [facility];  Service: Orthopedics;  Laterality: Right;    Family History  Problem Relation Age of Onset  . Coronary artery disease Brother   . Cancer Brother   . Heart disease Brother   . Heart attack Brother   . Anesthesia problems Neg Hx     Social History Michael [name] reports that he has been smoking Cigarettes.  He started smoking about 71 years ago. He has a 30 pack-year smoking history. He has never used smokeless tobacco. Michael [name] reports that he does not drink alcohol.  Review of Systems Complete review of systems are found to be negative unless outlined in H&P above. No continued cough, no fevers or chills.  Physical Examination Blood pressure 121/65, pulse 79, resp. rate 17, SpO2 94 %.  Telemetry: Sinus bradycardia   GEN: No acute distress. HEENT: Conjunctiva and lids normal, oropharynx clear. Neck: Supple, no elevated JVP, right carotid bruit, none on left, no thyromegaly. Lungs: Inspiratory crackles with rails at the base, coughing with deep  inspiration. Cardiac: Regular rate and rhythm, no S3 or significant systolic murmur, no pericardial rub. Abdomen: Soft, nontender, no hepatomegaly, bowel sounds present, no guarding or rebound. Extremities: No pitting edema, distal pulses 2+. Skin: Warm and dry. Musculoskeletal: No kyphosis. Neuropsychiatric: Alert and oriented x3, affect grossly appropriate.  Prior Cardiac Testing/Procedures 1. Lexiscan Stress test  There was no ST segment deviation noted during stress.  The study is normal.  This is a low risk study.  The left ventricular ejection fraction is hyperdynamic (>65%).  2. Carotid Doppler Study 1--/23/2015 Widely patent bilateral carotid endarterectomy without evidence of restenosis or hyperplasia, bilateral vertebral artery is antegrade  Lab Results  Basic Metabolic Panel:  Recent Labs Lab 07/02/15 1025  NA 134*  K 4.0  CL 98*  CO2 28  GLUCOSE 123*  BUN 11  CREATININE 1.02  CALCIUM 9.4    CBC:  Recent Labs Lab 07/02/15 1025  WBC 10.0  HGB 14.2  HCT 41.8  MCV 97.0  PLT 287    Radiology: Dg Chest 2 View  07/02/2015  CLINICAL DATA:  Left side anterior chest pain starting last night EXAM: CHEST  2 VIEW COMPARISON:  09/06/2014 FINDINGS: Cardiomediastinal silhouette is stable. Hyperinflation again noted. No acute infiltrate or pulmonary edema. Osteopenia and mild degenerative changes thoracic spine. Stable streaky scarring or fibrotic changes bilateral lung bases. Stable peripheral fibrotic changes right midlung. IMPRESSION: No active disease. Again noted hyperinflation. Stable fibrotic changes lung bases. Osteopenia and mild degenerative changes thoracic spine. Electronically Signed   By: Lahoma Crocker M.D.   On: 07/02/2015 10:58   ECG: Sinus rhythm with prolonged PR interval, decreased R wave progression, leftward axis.  Impression and Recommendations  1. Chest pain symptoms concerning for unstable angina: Symptom onset last night, perhaps some  improvement with nitroglycerin, although has not completely resolved. He is hemodynamically stable, ECG without acute ST segment change, and initial troponin I is normal.  He was given sublingual nitroglycerin with minimal improvement, is now wearing topical nitroglycerin patch. Will discontinue nitroglycerin paste and put  him on a nitroglycerin drip, also start heparin drip, continue to cycle troponin. We will transfer to [facility] for possible cardiac catheterization today.  2. Coronary artery disease: Status post BMS RCA 2003, DES RCA 2004, and DES RCA 2005. Cardiolite from June 2016 was low risk. Would continue aspirin, statin. He is not on beta blocker with history of COPD.  3. COPD:  No obvious infiltrates by chest x-ray. Patient reports recent "cold" symptoms. Would continue patient on albuterol inhaler.  4. Hypertension: Currently on amlodipine 10 mg daily, due to hypotension, with decreased to 5 mg daily and monitor patient's response as he is also on nitrates.  5. Carotid Artery Disease: Status post bilateral carotid endarterectomy.  Signed: Phill Myron. Lawrence NP [place]  07/02/2015, 1:44 PM Co-Sign MD   Attending note:  Patient seen and examined. Modified above note by Ms. Lawrence NP. Michael [name] is well-known to me, was seen in the office back in early February and clinically stable at that time. He has a history of CAD with BMS RCA 2003, DES RCA 2004, and DES RCA 2005. Cardiolite from June 2016 was low risk and we have managed him medically. He states that he has had some recent cold symptoms and cough, but these have improved. Last night around 8 PM he began to experience a pressure and tightness in his chest. He was able to go to sleep, but he had symptoms again this morning. He took a nitroglycerin with some improvement, but the symptoms did not resolve and his son encouraged him to come into the ER for evaluation.  On examination now he still complains of a mild feeling of chest  tightness on nitroglycerin paste. Heart rate in the 70s in sinus rhythm, systolic blood pressure 177L. Lungs exhibit decreased breath sounds with coarse rhonchi and prolonged expiratory phase but no wheezing. Cardiac exam reveals RRR without gallop, soft systolic murmur. Lab work shows potassium 4.0, creatinine 1.0, troponin I 0.01, hemoglobin 14.2, platelets 287. ECG shows sinus rhythm with decreased R wave progression, prolonged PR interval, leftward axis. Chest x-ray shows hyperinflation with chronic fibrotic changes at the bases.  Chest discomfort concerning for unstable angina. He does not have acute ST segment changes by ECG and his initial troponin I is negative, however continues to have chest discomfort despite use of nitroglycerin, reporting some improvement initially. We discussed options for further evaluation, and plan at this time is to have him transferred to [facility] in anticipation of a diagnostic cardiac catheterization to best clarify his coronary anatomy. We will transition to IV nitroglycerin and heparin, otherwise continue aspirin, Norvasc, and Lipitor. We discussed the risks and benefits, and he is in agreement to proceed.  Satira Sark, M.D., F.A.C.C.

## 2015-07-02 NOTE — ED Notes (Addendum)
Pt reports LT sided, non-radiating chest pressure that began last night. Pt states he was sitting when pain occurred. Pt denies SOB, dizziness, n/v. Pt has hx of stent placement. AOx4. Pt took 1 Nitro PTA.

## 2015-07-02 NOTE — ED Notes (Signed)
Pt given crackers and peanut butter per request.

## 2015-07-02 NOTE — Consult Note (Signed)
CARDIOLOGY CONSULT NOTE   Patient ID: Michael [name] MRN: [id number] DOB/AGE: [birth year] 82 y.o.  Admit Date: 07/02/2015 Referring Physician: ER - Milton Ferguson, MD  Primary Physician: Curlene Labrum, MD Consulting Cardiologist: Rozann Lesches MD Primary Cardiologist: Rozann Lesches MD Reason for Consultation: Chest Pain with known CAD  Clinical Summary Michael [name] is an 82 y.o.male with known history of coronary artery disease status post multiple interventions to the RCA, most recently DES in 2005, hypertension, hyperlipidemia, COPD, and carotid artery disease. He was seen in the office back in early February and clinically stable at that time on medical therapy. Cardiolite study from June 2016 was low risk.  The patient was in his usual state of health until last night around 8 PM, when he began to feel left-sided chest "pressure, squeezing feeling," nonradiating, without associated shortness breath, dizziness, or diaphoresis. Patient states he went to bed and was able to sleep all night. When he awoke this morning he continued to have the left-sided chest pressure and tightness. He to the nitroglycerin with mild improvement but it did not abate. He decided to call his primary care physician to discuss this, but his son who came to visit him, felt to be best to bring him to the emergency room.  On arrival to the emergency room, patient's blood pressure was 108/63, heart rate 67, O2 sat 95%. Labs on arrival reveal a sodium of 134, potassium 4.0, chloride 98, glucose 123, creatinine 1.02. Troponin 0.01. Chest x-ray revealed no active disease, hyperinflation was noted, stable fibrotic changes in the lung bases. ECG revealed normal sinus rhythm, first-degree AV block, rate of 69 bpm. He was given aspirin 324 mg daily, nitroglycerin sublingual, and a 2%. Nitroglycerin paste was placed topically. He reports some improvement with these interventions, but still has chest discomfort. It is not  worsened with movement, breathing, or palpation.  He states he had a recent cold with cough. The symptoms have improved. He cannot entirely recall previous angina symptoms with his last intervention.  Allergies  Allergen Reactions  . Hydrocodone-Acetaminophen Rash and Other (See Comments)    Confusion  . Oxycodone Hives  . Penicillins Hives  . Lyrica [Pregabalin] Other (See Comments)  . Morphine And Related Other (See Comments)  . Ciprofloxacin Nausea Only  . Iron Itching    Medications No current facility-administered medications on file prior to encounter.   Current Outpatient Prescriptions on File Prior to Encounter  Medication Sig Dispense Refill  . acetaminophen (TYLENOL) 650 MG CR tablet Take 1,300 mg by mouth every 8 (eight) hours as needed for pain.    Marland Kitchen albuterol (PROAIR HFA) 108 (90 BASE) MCG/ACT inhaler Inhale 2 puffs into the lungs every 6 (six) hours as needed. For shortness of breath    . amLODipine (NORVASC) 10 MG tablet Take 10 mg by mouth every morning.     Marland Kitchen aspirin 81 MG tablet Take 81 mg by mouth every morning.     Marland Kitchen atorvastatin (LIPITOR) 40 MG tablet Take 40 mg by mouth at bedtime.     . cetirizine (ZYRTEC) 10 MG tablet Take 10 mg by mouth every morning.     . citalopram (CELEXA) 20 MG tablet Take 20 mg by mouth every morning.     . clobetasol cream (TEMOVATE) 6.29 % Apply 1 application topically daily. Applied to legs for psoriasis    . ferrous sulfate 325 (65 FE) MG tablet Take 325 mg by mouth at bedtime.     . Fluticasone-Salmeterol (  ADVAIR DISKUS) 250-50 MCG/DOSE AEPB Inhale 1 puff into the lungs every 12 (twelve) hours.      . isosorbide mononitrate (IMDUR) 60 MG 24 hr tablet TAKE 1 TABLET TWICE A DAY (DOSE INCREASE) 180 tablet 3  . levothyroxine (SYNTHROID, LEVOTHROID) 125 MCG tablet Take 125 mcg by mouth every morning.     . Liniments (SALONPAS ARTHRITIS PAIN RELIEF) PADS Apply 1 each topically as needed (Shoulder Pain).    . Multiple Vitamin  (MULTIVITAMIN) tablet Take 1 tablet by mouth every morning.     . nitroGLYCERIN (NITROSTAT) 0.4 MG SL tablet Place 1 tablet (0.4 mg total) under the tongue every 5 (five) minutes as needed. For chest pain 25 tablet 3  . Omega 3 1200 MG CAPS Take 2 capsules by mouth 2 (two) times daily. Take 2 capsules in AM and 2 capsules in  PM    . polyethylene glycol (MIRALAX / GLYCOLAX) packet Take 17 g by mouth daily as needed for mild constipation. 14 each 0  . ranitidine (ZANTAC) 150 MG tablet Take 150 mg by mouth every morning.     . tiotropium (SPIRIVA) 18 MCG inhalation capsule Place 1 capsule (18 mcg total) into inhaler and inhale daily. 30 capsule 0  . traZODone (DESYREL) 100 MG tablet Take 100 mg by mouth at bedtime as needed. For sleep      Past Medical History  Diagnosis Date  . Hyperthyroidism 1978    Status post radioactive iodine treatment  . History of herpes zoster 2001  . Vitreous hemorrhage [facility]) May 2009    Right eye  . Peripheral arterial disease ([place])   . Mixed hyperlipidemia   . Essential hypertension, benign   . Allergic rhinitis   . COPD (chronic obstructive pulmonary disease) ([place])   . History of asbestos exposure   . Depression   . Anemia   . Carotid artery occlusion   . History of myocardial infarction   . Coronary atherosclerosis of native coronary artery     BMS RCA 2003, DES RCA 2004, DES RCA 2005   . GERD (gastroesophageal reflux disease)   . Hemorrhoids   . Head injury, closed, with brief LOC ([place])   . HOH (hard of hearing)   . Hypothyroidism   . Lumbar pain   . Psoriasis   . Prostate cancer [facility])     Status post XRT/IMRT 2008 - Dr. Alinda Money  . Renal cancer ([place])     Cryoablation in 2012  . Pneumonia     November 2014    Past Surgical History  Procedure Laterality Date  . Popliteal synovial cyst excision      knee  . Cervical disc surgery  APRIL 2004    fusion  . Partial knee arthroplasty  JULY 2005    Right-replacement  . Nasal septum surgery       SMR  . Neck disk fusion  07/2002  . Knee arthroplasty      bilateral  . Endarterectomy  08/30/2011    Procedure: Left ENDARTERECTOMY CAROTID;  Surgeon: Conrad Biscay, MD;  Location: [place];  Service: Vascular;  Laterality: Left;  . Joint replacement Right 2005    Right partial Knee  . Joint replacement Left 2011    Left toatal Knee  . Endarterectomy Right 12/18/2012    Procedure: ENDARTERECTOMY CAROTID WITH BOVINE PATCH ANGIOPLASTY;  Surgeon: Conrad , MD;  Location: [place];  Service: Vascular;  Laterality: Right;  . Carotid endarterectomy    . Cholecystectomy  1995  . Cardiac catheterization      AUG 2003, AUG 2004, OCT 2005 9 HAS 3 STENTS DONE AT 3 DIFFERENT CATHS - ONE IS DRUG ELUTING STENT; CATH 2006, 2007, 2009 2010  . Eye surgery  2010    bilat cataract.2009-Vitreous Hemorrhage-2009  . Total knee revision Right 08/06/2013    Procedure: REVISION RIGHT TOTAL KNEE ARTHROPLASTY  ;  Surgeon: Gearlean Alf, MD;  [facility];  Service: Orthopedics;  Laterality: Right;    Family History  Problem Relation Age of Onset  . Coronary artery disease Brother   . Cancer Brother   . Heart disease Brother   . Heart attack Brother   . Anesthesia problems Neg Hx     Social History Mr. Soto reports that he has been smoking Cigarettes.  He started smoking about 71 years ago. He has a 30 pack-year smoking history. He has never used smokeless tobacco. Mr. Havey reports that he does not drink alcohol.  Review of Systems Complete review of systems are found to be negative unless outlined in H&P above. No continued cough, no fevers or chills.  Physical Examination Blood pressure 121/65, pulse 79, resp. rate 17, SpO2 94 %.  Telemetry: Sinus bradycardia   GEN: No acute distress. HEENT: Conjunctiva and lids normal, oropharynx clear. Neck: Supple, no elevated JVP, right carotid bruit, none on left, no thyromegaly. Lungs: Inspiratory crackles with rails at the base, coughing with deep  inspiration. Cardiac: Regular rate and rhythm, no S3 or significant systolic murmur, no pericardial rub. Abdomen: Soft, nontender, no hepatomegaly, bowel sounds present, no guarding or rebound. Extremities: No pitting edema, distal pulses 2+. Skin: Warm and dry. Musculoskeletal: No kyphosis. Neuropsychiatric: Alert and oriented x3, affect grossly appropriate.  Prior Cardiac Testing/Procedures 1. Lexiscan Stress test  There was no ST segment deviation noted during stress.  The study is normal.  This is a low risk study.  The left ventricular ejection fraction is hyperdynamic (>65%).  2. Carotid Doppler Study 1--/23/2015 Widely patent bilateral carotid endarterectomy without evidence of restenosis or hyperplasia, bilateral vertebral artery is antegrade  Lab Results  Basic Metabolic Panel:  Recent Labs Lab 07/02/15 1025  NA 134*  K 4.0  CL 98*  CO2 28  GLUCOSE 123*  BUN 11  CREATININE 1.02  CALCIUM 9.4    CBC:  Recent Labs Lab 07/02/15 1025  WBC 10.0  HGB 14.2  HCT 41.8  MCV 97.0  PLT 287    Radiology: Dg Chest 2 View  07/02/2015  CLINICAL DATA:  Left side anterior chest pain starting last night EXAM: CHEST  2 VIEW COMPARISON:  09/06/2014 FINDINGS: Cardiomediastinal silhouette is stable. Hyperinflation again noted. No acute infiltrate or pulmonary edema. Osteopenia and mild degenerative changes thoracic spine. Stable streaky scarring or fibrotic changes bilateral lung bases. Stable peripheral fibrotic changes right midlung. IMPRESSION: No active disease. Again noted hyperinflation. Stable fibrotic changes lung bases. Osteopenia and mild degenerative changes thoracic spine. Electronically Signed   By: Lahoma Crocker M.D.   On: 07/02/2015 10:58   ECG: Sinus rhythm with prolonged PR interval, decreased R wave progression, leftward axis.  Impression and Recommendations  1. Chest pain symptoms concerning for unstable angina: Symptom onset last night, perhaps some  improvement with nitroglycerin, although has not completely resolved. He is hemodynamically stable, ECG without acute ST segment change, and initial troponin I is normal.  He was given sublingual nitroglycerin with minimal improvement, is now wearing topical nitroglycerin patch. Will discontinue nitroglycerin paste and put  him on a nitroglycerin drip, also start heparin drip, continue to cycle troponin. We will transfer to [facility] for possible cardiac catheterization today.  2. Coronary artery disease: Status post BMS RCA 2003, DES RCA 2004, and DES RCA 2005. Cardiolite from June 2016 was low risk. Would continue aspirin, statin. He is not on beta blocker with history of COPD.  3. COPD:  No obvious infiltrates by chest x-ray. Patient reports recent "cold" symptoms. Would continue patient on albuterol inhaler.  4. Hypertension: Currently on amlodipine 10 mg daily, due to hypotension, with decreased to 5 mg daily and monitor patient's response as he is also on nitrates.  5. Carotid Artery Disease: Status post bilateral carotid endarterectomy.  Signed: Phill Myron. Lawrence NP [place]  07/02/2015, 1:44 PM Co-Sign MD   Attending note:  Patient seen and examined. Modified above note by Ms. Lawrence NP. Mr. Rhinehart is well-known to me, was seen in the office back in early February and clinically stable at that time. He has a history of CAD with BMS RCA 2003, DES RCA 2004, and DES RCA 2005. Cardiolite from June 2016 was low risk and we have managed him medically. He states that he has had some recent cold symptoms and cough, but these have improved. Last night around 8 PM he began to experience a pressure and tightness in his chest. He was able to go to sleep, but he had symptoms again this morning. He took a nitroglycerin with some improvement, but the symptoms did not resolve and his son encouraged him to come into the ER for evaluation.  On examination now he still complains of a mild feeling of chest  tightness on nitroglycerin paste. Heart rate in the 70s in sinus rhythm, systolic blood pressure 098J. Lungs exhibit decreased breath sounds with coarse rhonchi and prolonged expiratory phase but no wheezing. Cardiac exam reveals RRR without gallop, soft systolic murmur. Lab work shows potassium 4.0, creatinine 1.0, troponin I 0.01, hemoglobin 14.2, platelets 287. ECG shows sinus rhythm with decreased R wave progression, prolonged PR interval, leftward axis. Chest x-ray shows hyperinflation with chronic fibrotic changes at the bases.  Chest discomfort concerning for unstable angina. He does not have acute ST segment changes by ECG and his initial troponin I is negative, however continues to have chest discomfort despite use of nitroglycerin, reporting some improvement initially. We discussed options for further evaluation, and plan at this time is to have him transferred to [facility] in anticipation of a diagnostic cardiac catheterization to best clarify his coronary anatomy. We will transition to IV nitroglycerin and heparin, otherwise continue aspirin, Norvasc, and Lipitor. We discussed the risks and benefits, and he is in agreement to proceed.  Satira Sark, M.D., F.A.C.C.

## 2015-07-02 NOTE — ED Provider Notes (Signed)
CSN: [id number]     Arrival date & time 07/02/15  1013 History  By signing my name below, I, Terressa Koyanagi, attest that this documentation has been prepared under the direction and in the presence of Milton Ferguson, MD. Electronically Signed: Terressa Koyanagi, ED Scribe. 07/02/2015. 11:15 AM.  Chief Complaint  Patient presents with  . Chest Pain   Patient is a 82 y.o. male presenting with chest pain. The history is provided by the patient. No language interpreter was used.  Chest Pain Pain location:  L chest Pain quality: pressure   Pain radiates to:  Does not radiate Pain radiates to the back: no   Pain severity:  Moderate Chronicity:  Recurrent Context: at rest   Relieved by:  Nitroglycerin Associated symptoms: no abdominal pain, no back pain, no cough, no diaphoresis, no dizziness, no fatigue, no headache, no nausea, no shortness of breath and not vomiting   Risk factors: coronary artery disease, high cholesterol, hypertension, male sex and smoking    PCP: Curlene Labrum, MD  CARDIOLOGIST: Satira Sark, MD HPI Comments: GREGORY BARRICK is a 82 y.o. male, with PMHx noted below including stent placement , who presents to the Emergency Department complaining of recurrent left sided, non-radiating, constant 6/10 chest pain that feels like pressure onset last night and worsening this morning. Pt reports he was at rest, sitting down, at onset of Sx. Pt reports taking one NTG PTA to the ED. During exam, pt denies any pain or any other Sx. Pt reports Hx of similar episodes with last episode occuring a little less than a year ago. Pt denies SOB, dizziness, n/v, or diaphoresis.   Past Medical History  Diagnosis Date  . Hyperthyroidism 1978    Status post radioactive iodine treatment  . History of herpes zoster 2001  . Vitreous hemorrhage [facility]) May 2009    Right eye  . Peripheral arterial disease ([place])   . Mixed hyperlipidemia   . Essential hypertension, benign   . Allergic rhinitis   .  COPD (chronic obstructive pulmonary disease) ([place])   . History of asbestos exposure   . Depression   . Anemia   . Carotid artery occlusion   . History of myocardial infarction   . Coronary atherosclerosis of native coronary artery     BMS RCA 2003, DES RCA 2004, DES RCA 2005   . GERD (gastroesophageal reflux disease)   . Hemorrhoids   . Head injury, closed, with brief LOC ([place])   . HOH (hard of hearing)   . Hypothyroidism   . Lumbar pain   . Psoriasis   . Prostate cancer [facility])     Status post XRT/IMRT 2008 - Dr. Alinda Money  . Renal cancer ([place])     Cryoablation in 2012  . Pneumonia     November 2014   Past Surgical History  Procedure Laterality Date  . Popliteal synovial cyst excision      knee  . Cervical disc surgery  APRIL 2004    fusion  . Partial knee arthroplasty  JULY 2005    Right-replacement  . Nasal septum surgery      SMR  . Neck disk fusion  07/2002  . Knee arthroplasty      bilateral  . Endarterectomy  08/30/2011    Procedure: Left ENDARTERECTOMY CAROTID;  Surgeon: Conrad Crary, MD;  Location: [place];  Service: Vascular;  Laterality: Left;  . Joint replacement Right 2005    Right partial Knee  . Joint replacement  Left 2011    Left toatal Knee  . Endarterectomy Right 12/18/2012    Procedure: ENDARTERECTOMY CAROTID WITH BOVINE PATCH ANGIOPLASTY;  Surgeon: Conrad [place]aks, MD;  Location: [place];  Service: Vascular;  Laterality: Right;  . Carotid endarterectomy    . Cholecystectomy  1995  . Cardiac catheterization      AUG 2003, AUG 2004, OCT 2005 9 HAS 3 STENTS DONE AT 3 DIFFERENT CATHS - ONE IS DRUG ELUTING STENT; CATH 2006, 2007, 2009 2010  . Eye surgery  2010    bilat cataract.2009-Vitreous Hemorrhage-2009  . Total knee revision Right 08/06/2013    Procedure: REVISION RIGHT TOTAL KNEE ARTHROPLASTY  ;  Surgeon: Gearlean Alf, MD;  [facility];  Service: Orthopedics;  Laterality: Right;   Family History  Problem Relation Age of Onset  . Coronary artery disease  Brother   . Cancer Brother   . Heart disease Brother   . Heart attack Brother   . Anesthesia problems Neg Hx    Social History  Substance Use Topics  . Smoking status: Current Every Day Smoker -- 0.50 packs/day for 60 years    Types: Cigarettes    Start date: 10/20/1943  . Smokeless tobacco: Never Used  . Alcohol Use: No     Comment: TRYING TO QUIT SMOKING    Review of Systems  Constitutional: Negative for diaphoresis, appetite change and fatigue.  HENT: Negative for congestion, ear discharge and sinus pressure.   Eyes: Negative for discharge.  Respiratory: Negative for cough and shortness of breath.   Cardiovascular: Positive for chest pain.  Gastrointestinal: Negative for nausea, vomiting, abdominal pain and diarrhea.  Genitourinary: Negative for frequency and hematuria.  Musculoskeletal: Negative for back pain.  Skin: Negative for rash.  Neurological: Negative for dizziness, seizures and headaches.  Psychiatric/Behavioral: Negative for hallucinations.   Allergies  Hydrocodone-acetaminophen; Oxycodone; Penicillins; Lyrica; Morphine and related; Ciprofloxacin; and Iron  Home Medications   Prior to Admission medications   Medication Sig Start Date End Date Taking? Authorizing Provider  acetaminophen (TYLENOL) 650 MG CR tablet Take 1,300 mg by mouth every 8 (eight) hours as needed for pain.    Historical Provider, MD  albuterol (PROAIR HFA) 108 (90 BASE) MCG/ACT inhaler Inhale 2 puffs into the lungs every 6 (six) hours as needed. For shortness of breath    Historical Provider, MD  amLODipine (NORVASC) 10 MG tablet Take 10 mg by mouth every morning.     Historical Provider, MD  aspirin 81 MG tablet Take 81 mg by mouth every morning.     Historical Provider, MD  atorvastatin (LIPITOR) 40 MG tablet Take 40 mg by mouth at bedtime.     Historical Provider, MD  cetirizine (ZYRTEC) 10 MG tablet Take 10 mg by mouth every morning.     Historical Provider, MD  citalopram (CELEXA) 20 MG  tablet Take 20 mg by mouth every morning.     Historical Provider, MD  clobetasol cream (TEMOVATE) 3.15 % Apply 1 application topically daily. Applied to legs for psoriasis    Historical Provider, MD  ferrous sulfate 325 (65 FE) MG tablet Take 325 mg by mouth at bedtime.     Historical Provider, MD  Fluticasone-Salmeterol (ADVAIR DISKUS) 250-50 MCG/DOSE AEPB Inhale 1 puff into the lungs every 12 (twelve) hours.      Historical Provider, MD  isosorbide mononitrate (IMDUR) 60 MG 24 hr tablet TAKE 1 TABLET TWICE A DAY (DOSE INCREASE) 10/05/14   Satira Sark, MD  levothyroxine (  SYNTHROID, LEVOTHROID) 125 MCG tablet Take 125 mcg by mouth every morning.     Historical Provider, MD  [facility] ARTHRITIS PAIN RELIEF) PADS Apply 1 each topically as needed (Shoulder Pain).    Historical Provider, MD  Multiple Vitamin (MULTIVITAMIN) tablet Take 1 tablet by mouth every morning.     Historical Provider, MD  nitroGLYCERIN (NITROSTAT) 0.4 MG SL tablet Place 1 tablet (0.4 mg total) under the tongue every 5 (five) minutes as needed. For chest pain 12/23/13   Satira Sark, MD  Omega 3 1200 MG CAPS Take 2 capsules by mouth 2 (two) times daily. Take 2 capsules in AM and 2 capsules in  PM    Historical Provider, MD  polyethylene glycol (MIRALAX / GLYCOLAX) packet Take 17 g by mouth daily as needed for mild constipation. 08/06/13   Amber Cecilio Asper, PA-C  ranitidine (ZANTAC) 150 MG tablet Take 150 mg by mouth every morning.     Historical Provider, MD  tiotropium (SPIRIVA) 18 MCG inhalation capsule Place 1 capsule (18 mcg total) into inhaler and inhale daily. 09/21/14   Deneise Lever, MD  traZODone (DESYREL) 100 MG tablet Take 100 mg by mouth at bedtime as needed. For sleep    Historical Provider, MD   Triage Vitals: BP 108/57 mmHg  Pulse 60  Resp 16  SpO2 95% Physical Exam  Constitutional: He is oriented to person, place, and time. He appears well-developed.  HENT:  Head: Normocephalic.  Eyes:  Conjunctivae and EOM are normal. No scleral icterus.  Neck: Neck supple. No thyromegaly present.  Cardiovascular: Normal rate and regular rhythm.  Exam reveals no gallop and no friction rub.   No murmur heard. Pulmonary/Chest: No stridor. He has no wheezes. He has no rales. He exhibits no tenderness.  Abdominal: He exhibits no distension. There is no tenderness. There is no rebound.  Musculoskeletal: Normal range of motion. He exhibits no edema.  Lymphadenopathy:    He has no cervical adenopathy.  Neurological: He is oriented to person, place, and time. He exhibits normal muscle tone. Coordination normal.  Skin: No rash noted. No erythema.  Psychiatric: He has a normal mood and affect. His behavior is normal.    ED Course  Procedures (including critical care time) DIAGNOSTIC STUDIES: Oxygen Saturation is 95% on ra, nl by my interpretation.    COORDINATION OF CARE: 11:13 AM: Discussed treatment plan which includes meds, imaging, labs, EKG, consult with cardiologist with pt at bedside; patient verbalizes understanding and agrees with treatment plan.  Labs Review Labs Reviewed  BASIC METABOLIC PANEL - Abnormal; Notable for the following:    Sodium 134 (*)    Chloride 98 (*)    Glucose, Bld 123 (*)    All other components within normal limits  CBC - Abnormal; Notable for the following:    RDW 15.8 (*)    All other components within normal limits  I-STAT TROPOININ, ED    Imaging Review Dg Chest 2 View  07/02/2015  CLINICAL DATA:  Left side anterior chest pain starting last night EXAM: CHEST  2 VIEW COMPARISON:  09/06/2014 FINDINGS: Cardiomediastinal silhouette is stable. Hyperinflation again noted. No acute infiltrate or pulmonary edema. Osteopenia and mild degenerative changes thoracic spine. Stable streaky scarring or fibrotic changes bilateral lung bases. Stable peripheral fibrotic changes right midlung. IMPRESSION: No active disease. Again noted hyperinflation. Stable fibrotic  changes lung bases. Osteopenia and mild degenerative changes thoracic spine. Electronically Signed   By: Lahoma Crocker M.D.   On:  07/02/2015 10:58   I have personally reviewed and evaluated these images and lab results as part of my medical decision-making.   EKG Interpretation   Date/Time:  Friday July 02 2015 10:19:28 EST Ventricular Rate:  69 PR Interval:  216 QRS Duration: 98 QT Interval:  378 QTC Calculation: 405 R Axis:   -23 Text Interpretation:  Sinus rhythm Borderline prolonged PR interval  Borderline left axis deviation Confirmed by Shakeita Vandevander  MD, Lochlann Mastrangelo ([id number]) on  07/02/2015 10:47:45 AM      MDM   Final diagnoses:  None    Patient seen by cardiology. Cardiology has put the patient on heparin drip and will transfer the patient to [place], MD 07/02/15 1347

## 2015-07-02 NOTE — Progress Notes (Signed)
Pt to cath lab for procedure via stretcher

## 2015-07-02 NOTE — ED Notes (Signed)
MD at bedside. 

## 2015-07-02 NOTE — Interval H&P Note (Signed)
Cath Lab Visit (complete for each Cath Lab visit)  Clinical Evaluation Leading to the Procedure:   ACS: Yes.    Non-ACS:    Anginal Classification: CCS IV  Anti-ischemic medical therapy: Minimal Therapy (1 class of medications)  Non-Invasive Test Results: No non-invasive testing performed  Prior CABG: No previous CABG      History and Physical Interval Note:  07/02/2015 4:28 PM  Calum L Chaudoin  has presented today for surgery, with the diagnosis of c/p  The various methods of treatment have been discussed with the patient and family. After consideration of risks, benefits and other options for treatment, the patient has consented to  Procedure(s): Left Heart Cath and Coronary Angiography (N/A) as a surgical intervention .  The patient's history has been reviewed, patient examined, no change in status, stable for surgery.  I have reviewed the patient's chart and labs.  Questions were answered to the patient's satisfaction.     Tayah Idrovo S.

## 2015-07-02 NOTE — Progress Notes (Signed)
  Received pt from [facility] via [place]..  Pt alert and oriented X4, with complaint of chest pain at a level  4 out of 10.  Pt was previously medicated with Morpine 2 mg and Zofran '4mg'$  at [facility] for chest pain 8 out of 10.  Monitor on pt, IVF of NS infusing with NTG drip at 41mgs. No acute distress noted at this time.

## 2015-07-02 NOTE — Progress Notes (Signed)
ANTICOAGULATION CONSULT NOTE - Initial Consult  Pharmacy Consult for heparin Indication: chest pain/ACS  Allergies  Allergen Reactions  . Hydrocodone-Acetaminophen Rash and Other (See Comments)    Confusion  . Oxycodone Hives  . Penicillins Hives  . Lyrica [Pregabalin] Other (See Comments)  . Morphine And Related Other (See Comments)  . Ciprofloxacin Nausea Only  . Iron Itching    Patient Measurements:   Heparin Dosing Weight: 60 kg  Vital Signs: BP: 121/65 mmHg (03/03 1332) Pulse Rate: 79 (03/03 1332)  Labs:  Recent Labs  07/02/15 1025  HGB 14.2  HCT 41.8  PLT 287  CREATININE 1.02    CrCl cannot be calculated (Unknown ideal weight.).   Medical History: Past Medical History  Diagnosis Date  . Hyperthyroidism 1978    Status post radioactive iodine treatment  . History of herpes zoster 2001  . Vitreous hemorrhage [facility]) May 2009    Right eye  . Peripheral arterial disease ([place])   . Mixed hyperlipidemia   . Essential hypertension, benign   . Allergic rhinitis   . COPD (chronic obstructive pulmonary disease) ([place])   . History of asbestos exposure   . Depression   . Anemia   . Carotid artery occlusion   . History of myocardial infarction   . Coronary atherosclerosis of native coronary artery     BMS RCA 2003, DES RCA 2004, DES RCA 2005   . GERD (gastroesophageal reflux disease)   . Hemorrhoids   . Head injury, closed, with brief LOC ([place])   . HOH (hard of hearing)   . Hypothyroidism   . Lumbar pain   . Psoriasis   . Prostate cancer [facility])     Status post XRT/IMRT 2008 - Dr. Alinda Money  . Renal cancer ([place])     Cryoablation in 2012  . Pneumonia     November 2014    Medications:  See med history  Assessment: 82 yo man to start heparin for CP.  He was not on anticoagulation PTA. Goal of Therapy:  Heparin level 0.3-0.7 units/ml Monitor platelets by anticoagulation protocol: Yes   Plan:  Heparin 3000 unit bolus and drip at 850 units/hr Check HL  ~6-8 hours after start Daily HL and CBC while on heparin Monitor for bleeding complications  Thanks for allowing pharmacy to be a part of this patient's care.  Excell Seltzer, PharmD Clinical Pharmacist 07/02/2015,1:48 PM

## 2015-07-03 ENCOUNTER — Inpatient Hospital Stay (HOSPITAL_COMMUNITY): Payer: Medicare Other

## 2015-07-03 DIAGNOSIS — I10 Essential (primary) hypertension: Secondary | ICD-10-CM | POA: Diagnosis not present

## 2015-07-03 DIAGNOSIS — I25110 Atherosclerotic heart disease of native coronary artery with unstable angina pectoris: Secondary | ICD-10-CM | POA: Diagnosis not present

## 2015-07-03 DIAGNOSIS — R0602 Shortness of breath: Secondary | ICD-10-CM | POA: Diagnosis not present

## 2015-07-03 DIAGNOSIS — I739 Peripheral vascular disease, unspecified: Secondary | ICD-10-CM | POA: Diagnosis not present

## 2015-07-03 DIAGNOSIS — T82855A Stenosis of coronary artery stent, initial encounter: Secondary | ICD-10-CM | POA: Diagnosis not present

## 2015-07-03 DIAGNOSIS — R079 Chest pain, unspecified: Secondary | ICD-10-CM | POA: Diagnosis not present

## 2015-07-03 DIAGNOSIS — Z77090 Contact with and (suspected) exposure to asbestos: Secondary | ICD-10-CM | POA: Diagnosis not present

## 2015-07-03 DIAGNOSIS — I2510 Atherosclerotic heart disease of native coronary artery without angina pectoris: Secondary | ICD-10-CM | POA: Diagnosis not present

## 2015-07-03 DIAGNOSIS — J449 Chronic obstructive pulmonary disease, unspecified: Secondary | ICD-10-CM | POA: Diagnosis not present

## 2015-07-03 LAB — TROPONIN I: Troponin I: <0.03 ng/mL (ref <0.031)

## 2015-07-03 LAB — BASIC METABOLIC PANEL
Anion gap: 7 (ref 5–15)
BUN: 14 mg/dL (ref 6–20)
CHLORIDE: 99 mmol/L — AB (ref 101–111)
CO2: 26 mmol/L (ref 22–32)
CREATININE: 1.21 mg/dL (ref 0.61–1.24)
Calcium: 8.5 mg/dL — ABNORMAL LOW (ref 8.9–10.3)
GFR, EST NON AFRICAN AMERICAN: 54 mL/min — AB (ref >60)
Glucose, Bld: 92 mg/dL (ref 65–99)
Potassium: 4.3 mmol/L (ref 3.5–5.1)
SODIUM: 132 mmol/L — AB (ref 135–145)

## 2015-07-03 LAB — CBC
HEMATOCRIT: 33.1 % — AB (ref 39.0–52.0)
Hemoglobin: 10.8 g/dL — ABNORMAL LOW (ref 13.0–17.0)
MCH: 31.6 pg (ref 26.0–34.0)
MCHC: 32.6 g/dL (ref 30.0–36.0)
MCV: 96.8 fL (ref 78.0–100.0)
Platelets: 214 10*3/uL (ref 150–400)
RBC: 3.42 MIL/uL — ABNORMAL LOW (ref 4.22–5.81)
RDW: 15.8 % — AB (ref 11.5–15.5)
WBC: 7.4 10*3/uL (ref 4.0–10.5)

## 2015-07-03 MED ORDER — TECHNETIUM TC 99M DIETHYLENETRIAME-PENTAACETIC ACID: 30.1000 | Freq: Once | INTRAVENOUS | Status: DC | PRN

## 2015-07-03 MED ORDER — RANOLAZINE ER 500 MG PO TB12: 500.0000 mg | ORAL_TABLET | Freq: Two times a day (BID) | ORAL | Status: DC

## 2015-07-03 MED ORDER — RANOLAZINE ER 500 MG PO TB12
500.0000 mg | ORAL_TABLET | Freq: Two times a day (BID) | ORAL | Status: DC
  Administered 2015-07-03: 500 mg via ORAL
  Filled 2015-07-03: qty 1

## 2015-07-03 MED ORDER — TECHNETIUM TO 99M ALBUMIN AGGREGATED
4.1000 | Freq: Once | INTRAVENOUS | Status: AC | PRN
  Administered 2015-07-03: 4 via INTRAVENOUS

## 2015-07-03 MED ORDER — AMLODIPINE BESYLATE 5 MG PO TABS: 5.0000 mg | ORAL_TABLET | Freq: Every morning | ORAL | Status: DC

## 2015-07-03 NOTE — Progress Notes (Signed)
Subjective: No CP  Objective: Vital signs in last 24 hours: Temp:  [97.2 F (36.2 C)-98.1 F (36.7 C)] 97.9 F (36.6 C) (03/04 0726) Pulse Rate:  [0-96] 50 (03/04 0726) Resp:  [0-24] 17 (03/04 0726) BP: (91-152)/(44-69) 110/49 mmHg (03/04 0726) SpO2:  [0 %-100 %] 89 % (03/04 0726) Weight:  [145 lb 1 oz (65.8 kg)-147 lb 0.8 oz (66.7 kg)] 147 lb 0.8 oz (66.7 kg) (03/04 0403) Last BM Date: 07/03/15  Intake/Output from previous day: 03/03 0701 - 03/04 0700 In: 276.4 [I.V.:276.4] Out: 680 [Urine:680] Intake/Output this shift:    Medications Scheduled Meds: . amLODipine  5 mg Oral q morning - 10a  . aspirin EC  81 mg Oral Daily  . atorvastatin  40 mg Oral QHS  . citalopram  20 mg Oral q morning - 10a  . famotidine  20 mg Oral Daily  . ferrous sulfate  325 mg Oral QHS  . heparin  3,000 Units Intravenous Once  . heparin  5,000 Units Subcutaneous 3 times per day  . levothyroxine  125 mcg Oral QAC breakfast  . multivitamin with minerals  1 tablet Oral q morning - 10a  . omega-3 acid ethyl esters  1 g Oral BID  . sodium chloride flush  3 mL Intravenous Q12H  . tiotropium  18 mcg Inhalation Daily   Continuous Infusions: . nitroGLYCERIN Stopped (07/02/15 1713)   PRN Meds:.sodium chloride, acetaminophen, albuterol, nitroGLYCERIN, ondansetron (ZOFRAN) IV, sodium chloride flush, traZODone  PE: General appearance: alert, cooperative and no distress Lungs: decreased BS on the right more than left.  some rhonchi.  some of it clears with cough. Heart: regular rate and rhythm, S1, S2 normal, no murmur, click, rub or gallop Extremities: No LEE Pulses: 2+ and symmetric Skin: Warm and dry.  right wrist cath site stable.  Neurologic: Grossly normal  Lab Results:   Recent Labs  07/02/15 1025 07/02/15 1751 07/03/15 0504  WBC 10.0 6.9 7.4  HGB 14.2 12.0* 10.8*  HCT 41.8 36.4* 33.1*  PLT 287 273 214   BMET  Recent Labs  07/02/15 1025 07/02/15 1700 07/03/15 0504  NA  134*  --  132*  K 4.0  --  4.3  CL 98*  --  99*  CO2 28  --  26  GLUCOSE 123*  --  92  BUN 11  --  14  CREATININE 1.02 0.94 1.21  CALCIUM 9.4  --  8.5*   PT/INR  Recent Labs  07/02/15 1700  LABPROT 14.6  INR 1.12    Assessment/Plan   Active Problems:   CAD, NATIVE VESSEL   COPD with emphysema (HCC)   History of asbestos exposure   Essential hypertension   Dyslipidemia   Unstable angina (HCC)  SP left heart cath revealing patent stent in RCA with only mild instent restenosis.  Nononbstructive disease in the LAD and circumflex.  The left ventricular systolic function is normal.  Normal LVEDP.  ASA, statin.    BP controlled.  Amlodipine 5,   Respiratory administering meds this morning.   DC home today.  FU with Dr. Domenic Polite.   LOS: 1 day    HAGER, BRYAN PA-C 07/03/2015 7:48 AM  Attending Note  Patient seen and discussed with PA Samara Snide, I agree with his documentation. Admitted with chest pain, cath shows patent coronaries. Continue medical therapy, we will start ranexa '500mg'$  bid as additional antianginal in setting of soft bp's. He will need follow up with Dr Domenic Polite or NP Purcell Nails  in 3-4 weeks, plan for dishcarge today.   Zandra Abts MD

## 2015-07-03 NOTE — Discharge Summary (Signed)
Discharge Summary    Patient ID: Michael [name],  MRN: [id number], DOB/AGE: 80-Jul-1933 80 y.o.  Admit date: 07/02/2015 Discharge date: 07/03/2015  Primary Care Provider: Curlene Labrum Primary Cardiologist: Domenic Polite  Discharge Diagnoses    Active Problems:   CAD, NATIVE VESSEL   COPD with emphysema ([place])   History of asbestos exposure   Essential hypertension   Dyslipidemia   Unstable angina (HCC)   Allergies Allergies  Allergen Reactions  . Hydrocodone-Acetaminophen Rash and Other (See Comments)    Confusion  . Oxycodone Hives  . Penicillins Hives  . Lyrica [Pregabalin] Other (See Comments)  . Morphine And Related Other (See Comments)  . Ciprofloxacin Nausea Only  . Iron Itching    Diagnostic Studies/Procedures     _____________   History of Present Illness     Michael [name] is an 82 y.o.male with known history of coronary artery disease status post multiple interventions to the RCA, most recently DES in 2005, hypertension, hyperlipidemia, COPD, and carotid artery disease. He was seen in the office back in early February and clinically stable at that time on medical therapy. Cardiolite study from June 2016 was low risk.  The patient was in his usual state of health until last night around 8 PM, when he began to feel left-sided chest "pressure, squeezing feeling," nonradiating, without associated shortness breath, dizziness, or diaphoresis. Patient states he went to bed and was able to sleep all night. When he awoke this morning he continued to have the left-sided chest pressure and tightness. He to the nitroglycerin with mild improvement but it did not abate. He decided to call his primary care physician to discuss this, but his son who came to visit him, felt to be best to bring him to the emergency room.  On arrival to the emergency room, patient's blood pressure was 108/63, heart rate 67, O2 sat 95%. Labs on arrival reveal a sodium of 134, potassium 4.0, chloride  98, glucose 123, creatinine 1.02. Troponin 0.01. Chest x-ray revealed no active disease, hyperinflation was noted, stable fibrotic changes in the lung bases. ECG revealed normal sinus rhythm, first-degree AV block, rate of 69 bpm. He was given aspirin 324 mg daily, nitroglycerin sublingual, and a 2%. Nitroglycerin paste was placed topically. He reports some improvement with these interventions, but still has chest discomfort. It is not worsened with movement, breathing, or palpation.  He states he had a recent cold with cough. The symptoms have improved. He cannot entirely recall previous angina symptoms with his last intervention.  Hospital Course     Consultants: None  The patient was started on IV NTG and Heparin.  He ruled out for MI.  He was transferred to [facility] for left heart cath which revealed patent stent in RCA with only mild instent restenosis. Nononbstructive disease in the LAD and circumflex. The left ventricular systolic function is normal. Normal LVEDP. ASA, statin with the addition of ranexa '500mg'$  BID.  Imdur was stopped and amlodipine decreased to '5mg'$  due to low soft BP.   DDimer was 0.59 and VQ scan was low probability.  The patient was seen by Dr. Harl Bowie who felt he was stable for DC home.     _____________  Discharge Vitals Blood pressure 110/49, pulse 50, temperature 97.9 F (36.6 C), temperature source Oral, resp. rate 17, height '5\' 8"'$  (1.727 m), weight 147 lb 0.8 oz (66.7 kg), SpO2 89 %.  Filed Weights   07/02/15 1728 07/03/15 0403  Weight: 145  lb 1 oz (65.8 kg) 147 lb 0.8 oz (66.7 kg)    Labs & Radiologic Studies     CBC  Recent Labs  07/02/15 1751 07/03/15 0504  WBC 6.9 7.4  HGB 12.0* 10.8*  HCT 36.4* 33.1*  MCV 96.6 96.8  PLT 273 950   Basic Metabolic Panel  Recent Labs  07/02/15 1025 07/02/15 1700 07/03/15 0504  NA 134*  --  132*  K 4.0  --  4.3  CL 98*  --  99*  CO2 28  --  26  GLUCOSE 123*  --  92  BUN 11  --  14    CREATININE 1.02 0.94 1.21  CALCIUM 9.4  --  8.5*   Liver Function Tests No results for input(s): AST, ALT, ALKPHOS, BILITOT, PROT, ALBUMIN in the last 72 hours. No results for input(s): LIPASE, AMYLASE in the last 72 hours. Cardiac Enzymes  Recent Labs  07/02/15 1700 07/02/15 2251 07/03/15 0504  TROPONINI <0.03 <0.03 <0.03   BNP Invalid input(s): POCBNP D-Dimer  Recent Labs  07/02/15 1700  DDIMER 0.59*    Dg Chest 2 View  07/02/2015  CLINICAL DATA:  Left side anterior chest pain starting last night EXAM: CHEST  2 VIEW COMPARISON:  09/06/2014 FINDINGS: Cardiomediastinal silhouette is stable. Hyperinflation again noted. No acute infiltrate or pulmonary edema. Osteopenia and mild degenerative changes thoracic spine. Stable streaky scarring or fibrotic changes bilateral lung bases. Stable peripheral fibrotic changes right midlung. IMPRESSION: No active disease. Again noted hyperinflation. Stable fibrotic changes lung bases. Osteopenia and mild degenerative changes thoracic spine. Electronically Signed   By: Lahoma Crocker M.D.   On: 07/02/2015 10:58    Disposition   Pt is being discharged home today in good condition.  Follow-up Plans & Appointments    Follow-up Information    Follow up with Rozann Lesches, MD.   Specialty:  Cardiology   Why:  The office will call you to schedule your follow up appt.   Contact information:   [place] [phone]      Discharge Instructions    Diet - low sodium heart healthy    Complete by:  As directed      Discharge instructions    Complete by:  As directed   No lifting with right arm for three days.     Increase activity slowly    Complete by:  As directed            Discharge Medications   Current Discharge Medication List    START taking these medications   Details  ranolazine (RANEXA) 500 MG 12 hr tablet Take 1 tablet (500 mg total) by mouth 2 (two) times daily. Qty: 60 tablet, Refills: 11       CONTINUE these medications which have CHANGED   Details  amLODipine (NORVASC) 5 MG tablet Take 1 tablet (5 mg total) by mouth every morning. Qty: 30 tablet, Refills: 11      CONTINUE these medications which have NOT CHANGED   Details  acetaminophen (TYLENOL) 650 MG CR tablet Take 1,300 mg by mouth every 8 (eight) hours as needed for pain.    albuterol (PROAIR HFA) 108 (90 BASE) MCG/ACT inhaler Inhale 2 puffs into the lungs every 6 (six) hours as needed. For shortness of breath    aspirin 81 MG tablet Take 81 mg by mouth every morning.     atorvastatin (LIPITOR) 40 MG tablet Take 40 mg by mouth at bedtime.  cetirizine (ZYRTEC) 10 MG tablet Take 10 mg by mouth every morning.     citalopram (CELEXA) 20 MG tablet Take 20 mg by mouth every morning.     clobetasol cream (TEMOVATE) 7.62 % Apply 1 application topically daily. Applied to legs for psoriasis    ferrous sulfate 325 (65 FE) MG tablet Take 325 mg by mouth at bedtime.     Fluticasone-Salmeterol (ADVAIR DISKUS) 250-50 MCG/DOSE AEPB Inhale 1 puff into the lungs every 12 (twelve) hours.      levothyroxine (SYNTHROID, LEVOTHROID) 125 MCG tablet Take 125 mcg by mouth every morning.     Liniments (SALONPAS ARTHRITIS PAIN RELIEF) PADS Apply 1 each topically as needed (Shoulder Pain).    Multiple Vitamin (MULTIVITAMIN) tablet Take 1 tablet by mouth every morning.     nitroGLYCERIN (NITROSTAT) 0.4 MG SL tablet Place 1 tablet (0.4 mg total) under the tongue every 5 (five) minutes as needed. For chest pain Qty: 25 tablet, Refills: 3    Omega 3 1200 MG CAPS Take 2 capsules by mouth 2 (two) times daily. Take 2 capsules in AM and 2 capsules in  PM    polyethylene glycol (MIRALAX / GLYCOLAX) packet Take 17 g by mouth daily as needed for mild constipation. Qty: 14 each, Refills: 0    ranitidine (ZANTAC) 150 MG tablet Take 150 mg by mouth every morning.     tiotropium (SPIRIVA) 18 MCG inhalation capsule Place 1 capsule (18 mcg  total) into inhaler and inhale daily. Qty: 30 capsule, Refills: 0    traZODone (DESYREL) 100 MG tablet Take 100 mg by mouth at bedtime as needed. For sleep      STOP taking these medications     isosorbide mononitrate (IMDUR) 60 MG 24 hr tablet            Outstanding Labs/Studies     Duration of Discharge Encounter   Greater than 30 minutes including physician time.  Otilio Connors PAC 07/03/2015, 8:33 AM

## 2015-07-05 ENCOUNTER — Encounter (HOSPITAL_COMMUNITY): Payer: Self-pay | Admitting: Interventional Cardiology

## 2015-07-16 DIAGNOSIS — J441 Chronic obstructive pulmonary disease with (acute) exacerbation: Secondary | ICD-10-CM | POA: Diagnosis not present

## 2015-07-16 DIAGNOSIS — R05 Cough: Secondary | ICD-10-CM | POA: Diagnosis not present

## 2015-08-02 ENCOUNTER — Telehealth: Payer: Self-pay | Admitting: Cardiology

## 2015-08-02 MED ORDER — AMLODIPINE BESYLATE 5 MG PO TABS: 5.0000 mg | ORAL_TABLET | Freq: Every morning | ORAL | Status: DC

## 2015-08-02 MED ORDER — RANOLAZINE ER 500 MG PO TB12: 500.0000 mg | ORAL_TABLET | Freq: Two times a day (BID) | ORAL | Status: DC

## 2015-08-02 NOTE — Telephone Encounter (Signed)
Done

## 2015-08-02 NOTE — Telephone Encounter (Signed)
amLODipine (NORVASC) 5 MG tablet - changed dose earlier in march but not to correct place ranolazine (RANEXA) 500 MG 12 hr tablet  Need to be sent to [facility] [phone]

## 2015-08-04 DIAGNOSIS — K219 Gastro-esophageal reflux disease without esophagitis: Secondary | ICD-10-CM | POA: Diagnosis not present

## 2015-08-04 DIAGNOSIS — N183 Chronic kidney disease, stage 3 (moderate): Secondary | ICD-10-CM | POA: Diagnosis not present

## 2015-08-04 DIAGNOSIS — E039 Hypothyroidism, unspecified: Secondary | ICD-10-CM | POA: Diagnosis not present

## 2015-08-04 DIAGNOSIS — I10 Essential (primary) hypertension: Secondary | ICD-10-CM | POA: Diagnosis not present

## 2015-08-04 DIAGNOSIS — E782 Mixed hyperlipidemia: Secondary | ICD-10-CM | POA: Diagnosis not present

## 2015-08-10 DIAGNOSIS — J439 Emphysema, unspecified: Secondary | ICD-10-CM | POA: Diagnosis not present

## 2015-08-10 DIAGNOSIS — J302 Other seasonal allergic rhinitis: Secondary | ICD-10-CM | POA: Diagnosis not present

## 2015-08-10 DIAGNOSIS — I10 Essential (primary) hypertension: Secondary | ICD-10-CM | POA: Diagnosis not present

## 2015-08-10 DIAGNOSIS — K219 Gastro-esophageal reflux disease without esophagitis: Secondary | ICD-10-CM | POA: Diagnosis not present

## 2015-08-10 DIAGNOSIS — M545 Low back pain: Secondary | ICD-10-CM | POA: Diagnosis not present

## 2015-08-10 DIAGNOSIS — I708 Atherosclerosis of other arteries: Secondary | ICD-10-CM | POA: Diagnosis not present

## 2015-08-10 DIAGNOSIS — N183 Chronic kidney disease, stage 3 (moderate): Secondary | ICD-10-CM | POA: Diagnosis not present

## 2015-08-10 DIAGNOSIS — N400 Enlarged prostate without lower urinary tract symptoms: Secondary | ICD-10-CM | POA: Diagnosis not present

## 2015-08-16 DIAGNOSIS — M25511 Pain in right shoulder: Secondary | ICD-10-CM | POA: Diagnosis not present

## 2015-08-16 DIAGNOSIS — M19011 Primary osteoarthritis, right shoulder: Secondary | ICD-10-CM | POA: Diagnosis not present

## 2015-08-20 ENCOUNTER — Other Ambulatory Visit: Payer: Self-pay | Admitting: *Deleted

## 2015-08-20 ENCOUNTER — Encounter: Payer: Self-pay | Admitting: Cardiology

## 2015-08-20 ENCOUNTER — Ambulatory Visit (INDEPENDENT_AMBULATORY_CARE_PROVIDER_SITE_OTHER): Payer: Medicare Other | Admitting: Cardiology

## 2015-08-20 VITALS — BP 99/60 | HR 89 | Ht 67.0 in | Wt 147.8 lb

## 2015-08-20 DIAGNOSIS — I6523 Occlusion and stenosis of bilateral carotid arteries: Secondary | ICD-10-CM | POA: Diagnosis not present

## 2015-08-20 DIAGNOSIS — I25119 Atherosclerotic heart disease of native coronary artery with unspecified angina pectoris: Secondary | ICD-10-CM

## 2015-08-20 DIAGNOSIS — E782 Mixed hyperlipidemia: Secondary | ICD-10-CM | POA: Diagnosis not present

## 2015-08-20 MED ORDER — RANOLAZINE ER 500 MG PO TB12: 500.0000 mg | ORAL_TABLET | Freq: Two times a day (BID) | ORAL | Status: DC

## 2015-08-20 MED ORDER — AMLODIPINE BESYLATE 5 MG PO TABS: 5.0000 mg | ORAL_TABLET | Freq: Every morning | ORAL | Status: DC

## 2015-08-20 NOTE — Progress Notes (Signed)
Cardiology Office Note  Date: 08/20/2015   ID: Michael [name], DOB [birth year], MRN [id number]  PCP: Curlene Labrum, MD  Primary Cardiologist: Rozann Lesches, MD   Chief Complaint  Patient presents with  . Coronary Artery Disease    History of Present Illness: Michael [name] is an 82 y.o. male last seen in February, at that time clinically stable on medical therapy. I reviewed interval records, he was admitted to [facility] in March with chest discomfort. He ruled out for ACS with normal troponin I levels. Follow-up cardiac catheterization was undertaken revealing patent stent site within the RCA associated with mild in-stent restenosis, otherwise nonobstructive disease in the LAD and circumflex. He was continued on medical therapy, Ranexa was added, amlodipine dose was reduced due to blood pressure. He also underwent a ventilation perfusion lung scan which was low probability for pulmonary embolus.  He is here today with his daughter. Overall does not report any recurring chest pain at present. Blood pressure is low normal today, he was not dizzy or symptomatic with current measurement. I reviewed his medications. No significant change since hospital discharge. He seems to be tolerating Ranexa.  Past Medical History  Diagnosis Date  . Hyperthyroidism 1978    Status post radioactive iodine treatment  . History of herpes zoster 2001  . Peripheral arterial disease ([place])   . Mixed hyperlipidemia   . Essential hypertension, benign   . Allergic rhinitis   . COPD (chronic obstructive pulmonary disease) ([place])   . History of asbestos exposure   . Depression   . Anemia   . Carotid artery occlusion   . Coronary atherosclerosis of native coronary artery     BMS RCA 2003, DES RCA 2004, DES RCA 2005   . GERD (gastroesophageal reflux disease)   . Hemorrhoids   . HOH (hard of hearing)   . Hypothyroidism   . Lumbar pain   . Psoriasis   . Anginal pain ([place])   . Pneumonia  November 2014  . Myocardial infarction [facility])     "during 2nd or 3rd stent"  . Arthritis     "shoulders, knees mostly" (07/02/2015)  . Anxiety   . Prostate cancer [facility])     Status post XRT/IMRT 2008 - Dr. Alinda Money  . Renal cancer ([place])     Cryoablation in 2012    Current Outpatient Prescriptions  Medication Sig Dispense Refill  . acetaminophen (TYLENOL) 650 MG CR tablet Take 1,300 mg by mouth every 8 (eight) hours as needed for pain.    Marland Kitchen albuterol (PROAIR HFA) 108 (90 BASE) MCG/ACT inhaler Inhale 2 puffs into the lungs every 6 (six) hours as needed. For shortness of breath    . amLODipine (NORVASC) 5 MG tablet Take 1 tablet (5 mg total) by mouth every morning. 90 tablet 3  . aspirin 81 MG tablet Take 81 mg by mouth every morning.     Marland Kitchen atorvastatin (LIPITOR) 40 MG tablet Take 40 mg by mouth at bedtime.     . cetirizine (ZYRTEC) 10 MG tablet Take 10 mg by mouth every morning.     . citalopram (CELEXA) 20 MG tablet Take 20 mg by mouth every morning.     . clobetasol cream (TEMOVATE) 2.42 % Apply 1 application topically daily. Applied to legs for psoriasis    . ferrous sulfate 325 (65 FE) MG tablet Take 325 mg by mouth at bedtime.     . Fluticasone-Salmeterol (ADVAIR DISKUS) 250-50 MCG/DOSE AEPB Inhale 1 puff  into the lungs every 12 (twelve) hours.      Marland Kitchen levothyroxine (SYNTHROID, LEVOTHROID) 125 MCG tablet Take 125 mcg by mouth every morning.     . Liniments (SALONPAS ARTHRITIS PAIN RELIEF) PADS Apply 1 each topically as needed (Shoulder Pain).    . Multiple Vitamin (MULTIVITAMIN) tablet Take 1 tablet by mouth every morning.     . nitroGLYCERIN (NITROSTAT) 0.4 MG SL tablet Place 1 tablet (0.4 mg total) under the tongue every 5 (five) minutes as needed. For chest pain 25 tablet 3  . Omega 3 1200 MG CAPS Take 2 capsules by mouth 2 (two) times daily. Take 2 capsules in AM and 2 capsules in  PM    . polyethylene glycol (MIRALAX / GLYCOLAX) packet Take 17 g by mouth daily as needed for mild  constipation. 14 each 0  . ranitidine (ZANTAC) 150 MG tablet Take 150 mg by mouth every morning.     . ranolazine (RANEXA) 500 MG 12 hr tablet Take 1 tablet (500 mg total) by mouth 2 (two) times daily. 180 tablet 3  . tiotropium (SPIRIVA) 18 MCG inhalation capsule Place 1 capsule (18 mcg total) into inhaler and inhale daily. 30 capsule 0  . traZODone (DESYREL) 100 MG tablet Take 100 mg by mouth at bedtime as needed. For sleep     No current facility-administered medications for this visit.   Allergies:  Hydrocodone-acetaminophen; Oxycodone; Penicillins; Lyrica; Morphine and related; Ciprofloxacin; and Iron   Social History: The patient  reports that he has been smoking Cigarettes.  He started smoking about 71 years ago. He has a 7.92 pack-year smoking history. He has never used smokeless tobacco. He reports that he does not drink alcohol or use illicit drugs.   ROS:  Please see the history of present illness. Otherwise, complete review of systems is positive for memory trouble.  All other systems are reviewed and negative.   Physical Exam: VS:  BP 99/60 mmHg  Pulse 89  Ht '5\' 7"'$  (1.702 m)  Wt 147 lb 12.8 oz (67.042 kg)  BMI 23.14 kg/m2  SpO2 95%, BMI Body mass index is 23.14 kg/(m^2).  Wt Readings from Last 3 Encounters:  08/20/15 147 lb 12.8 oz (67.042 kg)  07/03/15 147 lb 0.8 oz (66.7 kg)  06/10/15 145 lb (65.772 kg)    General: Elderly male, appears comfortable at rest. HEENT: Conjunctiva and lids normal, oropharynx clear. Neck: Supple, no elevated JVP, bilateral CEA scars, no thyromegaly. Lungs: Diminished breath sounds without wheezing, nonlabored breathing at rest. Cardiac: Regular rate and rhythm, no S3 or significant systolic murmur, no pericardial rub. Abdomen: Soft, nontender, bowel sounds present, no guarding or rebound. Extremities: No pitting edema, distal pulses 2+.  ECG: I personally reviewed the prior tracing from 07/02/2015 which showed sinus rhythm with prolonged PR  interval and leftward axis.  Recent Labwork: 09/06/2014: ALT 15*; AST 24 07/03/2015: BUN 14; Creatinine, Ser 1.21; Hemoglobin 10.8*; Platelets 214; Potassium 4.3; Sodium 132*     Component Value Date/Time   CHOL 122 02/16/2014 0124   TRIG 74 02/16/2014 0124   HDL 67 02/16/2014 0124   CHOLHDL 1.8 02/16/2014 0124   VLDL 15 02/16/2014 0124   LDLCALC 40 02/16/2014 0124    Other Studies Reviewed Today:  Lexiscan Cardiolite 10/08/2014:  There was no ST segment deviation noted during stress.  The study is normal.  This is a low risk study.  The left ventricular ejection fraction is hyperdynamic (>65%).  Carotid Dopplers 02/20/2014: Widely patent bilateral CEA  sites.  Cardiac catheterization 07/02/2015:  Patent stents in the RCA with only mild instent restenosis.  Nononbstructive disease in the LAD and circumflex.  The left ventricular systolic function is normal.  Normal LVEDP.  Assessment and Plan:  1. Symptomatically stable CAD. Recent cardiac catheterization documented patent RCA stent sites with mild in-stent restenosis, otherwise nonobstructive LAD and circumflex disease. Will plan to continue current medical regimen. I asked him to keep an eye on blood pressure periodically at home. If remains low or symptomatic, may need to cut back Norvasc further.  2. Hyperlipidemia, continues on Lipitor.  Current medicines were reviewed with the patient today.  Disposition: FU with me in 6 months.   Signed, Satira Sark, MD, [facility] 08/20/2015 4:38 PM    [place] [facility] [place], [place], [place] [address] Phone: [phone]; Fax: [phone]

## 2015-08-20 NOTE — Patient Instructions (Signed)
Your physician recommends that you continue on your current medications as directed. Please refer to the Current Medication list given to you today.  Your physician recommends that you schedule a follow-up appointment in: 3 months  

## 2015-08-25 ENCOUNTER — Telehealth: Payer: Self-pay | Admitting: *Deleted

## 2015-08-25 ENCOUNTER — Other Ambulatory Visit (HOSPITAL_COMMUNITY): Payer: Self-pay | Admitting: Urology

## 2015-08-25 ENCOUNTER — Ambulatory Visit (HOSPITAL_COMMUNITY)
Admission: RE | Admit: 2015-08-25 | Discharge: 2015-08-25 | Disposition: A | Discharge status: Home / Self Care | Payer: Medicare Other | Source: Ambulatory Visit | Attending: Urology | Admitting: Urology

## 2015-08-25 DIAGNOSIS — J449 Chronic obstructive pulmonary disease, unspecified: Secondary | ICD-10-CM | POA: Insufficient documentation

## 2015-08-25 DIAGNOSIS — D49512 Neoplasm of unspecified behavior of left kidney: Secondary | ICD-10-CM

## 2015-08-25 DIAGNOSIS — D4102 Neoplasm of uncertain behavior of left kidney: Secondary | ICD-10-CM

## 2015-08-25 DIAGNOSIS — C61 Malignant neoplasm of prostate: Secondary | ICD-10-CM | POA: Diagnosis not present

## 2015-08-25 DIAGNOSIS — N281 Cyst of kidney, acquired: Secondary | ICD-10-CM | POA: Diagnosis not present

## 2015-08-25 DIAGNOSIS — C642 Malignant neoplasm of left kidney, except renal pelvis: Secondary | ICD-10-CM | POA: Diagnosis not present

## 2015-08-25 NOTE — Telephone Encounter (Signed)
Daughter Curley Spice) - question about continuing Ranexa & Imdur, if so need Imdur '60mg'$  sent to [facility].    Review of chart - Imdur was stopped at last hospital visit in March.    Attempted to return call - left message.

## 2015-08-26 NOTE — Telephone Encounter (Signed)
Imdur has been discontinued.

## 2015-08-26 NOTE — Telephone Encounter (Signed)
Daughter Richrd Prime) returning call this morning.  Stated her sister came with Dad to appointment.  Stated she has been out of the loop for a couple of months due to being sick herself (breast cancer).  She is usually the one that has been taking care of all of his medications.  Theophylline & Nasonex added to medication list today.  Stated that he had still been taking the Imdur also.  Explained to her that med was dc'd at last hospital visit.  Stated she will stop it today, but wanted confirmation from MD that this was correct.  She also questioned if that could have affected the low BP at OV.

## 2015-08-26 NOTE — Telephone Encounter (Signed)
Attempted to return call - busy.

## 2015-08-27 ENCOUNTER — Other Ambulatory Visit: Payer: Self-pay | Admitting: Cardiology

## 2015-08-27 MED ORDER — NITROGLYCERIN 0.4 MG SL SUBL: 0.4000 mg | SUBLINGUAL_TABLET | SUBLINGUAL | Status: DC | PRN

## 2015-08-27 NOTE — Telephone Encounter (Signed)
REFILL:  nitroGLYCERIN (NITROSTAT) 0.4 MG SL tablet requesting to be called to the [facility]. Would like to pick up today.

## 2015-08-27 NOTE — Telephone Encounter (Signed)
Medication sent to pharmacy  

## 2015-08-30 ENCOUNTER — Emergency Department (HOSPITAL_COMMUNITY)
Admission: EM | Admit: 2015-08-30 | Discharge: 2015-08-30 | Disposition: A | Discharge status: Home / Self Care | Payer: Medicare Other | Attending: Emergency Medicine | Admitting: Emergency Medicine

## 2015-08-30 ENCOUNTER — Emergency Department (HOSPITAL_COMMUNITY): Payer: Medicare Other

## 2015-08-30 ENCOUNTER — Encounter (HOSPITAL_COMMUNITY): Payer: Self-pay | Admitting: Emergency Medicine

## 2015-08-30 DIAGNOSIS — M179 Osteoarthritis of knee, unspecified: Secondary | ICD-10-CM | POA: Diagnosis not present

## 2015-08-30 DIAGNOSIS — J441 Chronic obstructive pulmonary disease with (acute) exacerbation: Secondary | ICD-10-CM | POA: Diagnosis not present

## 2015-08-30 DIAGNOSIS — F17210 Nicotine dependence, cigarettes, uncomplicated: Secondary | ICD-10-CM | POA: Diagnosis not present

## 2015-08-30 DIAGNOSIS — R0789 Other chest pain: Secondary | ICD-10-CM | POA: Diagnosis present

## 2015-08-30 DIAGNOSIS — E782 Mixed hyperlipidemia: Secondary | ICD-10-CM | POA: Diagnosis not present

## 2015-08-30 DIAGNOSIS — I2510 Atherosclerotic heart disease of native coronary artery without angina pectoris: Secondary | ICD-10-CM | POA: Insufficient documentation

## 2015-08-30 DIAGNOSIS — Z85528 Personal history of other malignant neoplasm of kidney: Secondary | ICD-10-CM | POA: Insufficient documentation

## 2015-08-30 DIAGNOSIS — F329 Major depressive disorder, single episode, unspecified: Secondary | ICD-10-CM | POA: Insufficient documentation

## 2015-08-30 DIAGNOSIS — Z7982 Long term (current) use of aspirin: Secondary | ICD-10-CM | POA: Insufficient documentation

## 2015-08-30 DIAGNOSIS — J189 Pneumonia, unspecified organism: Secondary | ICD-10-CM | POA: Diagnosis not present

## 2015-08-30 DIAGNOSIS — E039 Hypothyroidism, unspecified: Secondary | ICD-10-CM | POA: Insufficient documentation

## 2015-08-30 DIAGNOSIS — E0590 Thyrotoxicosis, unspecified without thyrotoxic crisis or storm: Secondary | ICD-10-CM | POA: Insufficient documentation

## 2015-08-30 DIAGNOSIS — Z79899 Other long term (current) drug therapy: Secondary | ICD-10-CM | POA: Insufficient documentation

## 2015-08-30 DIAGNOSIS — Z8546 Personal history of malignant neoplasm of prostate: Secondary | ICD-10-CM | POA: Diagnosis not present

## 2015-08-30 DIAGNOSIS — I252 Old myocardial infarction: Secondary | ICD-10-CM | POA: Diagnosis not present

## 2015-08-30 DIAGNOSIS — R918 Other nonspecific abnormal finding of lung field: Secondary | ICD-10-CM | POA: Diagnosis not present

## 2015-08-30 DIAGNOSIS — I10 Essential (primary) hypertension: Secondary | ICD-10-CM | POA: Diagnosis not present

## 2015-08-30 LAB — BASIC METABOLIC PANEL
Anion gap: 7 (ref 5–15)
BUN: 13 mg/dL (ref 6–20)
CO2: 26 mmol/L (ref 22–32)
Calcium: 9 mg/dL (ref 8.9–10.3)
Chloride: 92 mmol/L — ABNORMAL LOW (ref 101–111)
Creatinine, Ser: 1.19 mg/dL (ref 0.61–1.24)
GFR calc Af Amer: >60 mL/min (ref >60)
GFR calc non Af Amer: 55 mL/min — ABNORMAL LOW (ref >60)
Glucose, Bld: 118 mg/dL — ABNORMAL HIGH (ref 65–99)
Potassium: 3.9 mmol/L (ref 3.5–5.1)
Sodium: 125 mmol/L — ABNORMAL LOW (ref 135–145)

## 2015-08-30 LAB — CBC
HEMATOCRIT: 39.8 % (ref 39.0–52.0)
HEMOGLOBIN: 14 g/dL (ref 13.0–17.0)
MCH: 33.1 pg (ref 26.0–34.0)
MCHC: 35.2 g/dL (ref 30.0–36.0)
MCV: 94.1 fL (ref 78.0–100.0)
Platelets: 260 10*3/uL (ref 150–400)
RBC: 4.23 MIL/uL (ref 4.22–5.81)
RDW: 14.8 % (ref 11.5–15.5)
WBC: 11.1 10*3/uL — AB (ref 4.0–10.5)

## 2015-08-30 LAB — TROPONIN I: Troponin I: <0.03 ng/mL (ref <0.031)

## 2015-08-30 LAB — PROTIME-INR
INR: 0.95 (ref 0.00–1.49)
Prothrombin Time: 12.9 seconds (ref 11.6–15.2)

## 2015-08-30 MED ORDER — DOXYCYCLINE HYCLATE 100 MG PO CAPS: 100.0000 mg | ORAL_CAPSULE | Freq: Two times a day (BID) | ORAL | Status: DC

## 2015-08-30 MED ORDER — ALBUTEROL SULFATE (2.5 MG/3ML) 0.083% IN NEBU
2.5000 mg | INHALATION_SOLUTION | RESPIRATORY_TRACT | Status: DC | PRN
  Administered 2015-08-30: 2.5 mg via RESPIRATORY_TRACT
  Filled 2015-08-30: qty 3

## 2015-08-30 MED ORDER — PREDNISONE 20 MG PO TABS: 20.0000 mg | ORAL_TABLET | Freq: Two times a day (BID) | ORAL | Status: DC

## 2015-08-30 MED ORDER — METHYLPREDNISOLONE SODIUM SUCC 125 MG IJ SOLR
125.0000 mg | Freq: Once | INTRAMUSCULAR | Status: AC
  Administered 2015-08-30: 125 mg via INTRAVENOUS
  Filled 2015-08-30: qty 2

## 2015-08-30 MED ORDER — DOXYCYCLINE HYCLATE 100 MG PO TABS
100.0000 mg | ORAL_TABLET | Freq: Once | ORAL | Status: AC
  Administered 2015-08-30: 100 mg via ORAL
  Filled 2015-08-30: qty 1

## 2015-08-30 MED ORDER — ALBUTEROL SULFATE (2.5 MG/3ML) 0.083% IN NEBU
2.5000 mg | INHALATION_SOLUTION | Freq: Once | RESPIRATORY_TRACT | Status: AC
  Administered 2015-08-30: 2.5 mg via RESPIRATORY_TRACT
  Filled 2015-08-30: qty 3

## 2015-08-30 NOTE — ED Provider Notes (Signed)
CSN: [id number]     Arrival date & time 08/30/15  1357 History   First MD Initiated Contact with Patient 08/30/15 1405     Chief Complaint  Patient presents with  . Chest Pain      HPI  She presents evaluation of "tightness" in his anterior chest. Has a history of heart disease and has a right coronary stent. Follows with Dr. Domenic Polite. Most recent cath was February of this year. Showed minimal in stent restenosis. He is on medical management.  Has a history of COPD. States he normally uses inhalers every day. He has not used his inhalers today. Times are poorly localized anterior chest described as tightness. He states "wheezing in there too". Has had more of a cough. Felt chilled this morning but no frank riders. Did not check temperature at home. No more shortness of breath and usual. Has not used inhalers today, he is uncertain why.  Past Medical History  Diagnosis Date  . Hyperthyroidism 1978    Status post radioactive iodine treatment  . History of herpes zoster 2001  . Peripheral arterial disease ([place])   . Mixed hyperlipidemia   . Essential hypertension, benign   . Allergic rhinitis   . COPD (chronic obstructive pulmonary disease) ([place])   . History of asbestos exposure   . Depression   . Anemia   . Carotid artery occlusion   . Coronary atherosclerosis of native coronary artery     BMS RCA 2003, DES RCA 2004, DES RCA 2005   . GERD (gastroesophageal reflux disease)   . Hemorrhoids   . HOH (hard of hearing)   . Hypothyroidism   . Lumbar pain   . Psoriasis   . Anginal pain ([place])   . Pneumonia November 2014  . Myocardial infarction [facility])     "during 2nd or 3rd stent"  . Arthritis     "shoulders, knees mostly" (07/02/2015)  . Anxiety   . Prostate cancer [facility])     Status post XRT/IMRT 2008 - Dr. Alinda Money  . Renal cancer ([place])     Cryoablation in 2012   Past Surgical History  Procedure Laterality Date  . Popliteal synovial cyst excision    . Anterior cervical  decomp/discectomy fusion  07/2002  . Partial knee arthroplasty Right 10/2003  . Nasal septum surgery      SMR  . Back surgery  07/2002  . Knee arthroplasty      bilateral  . Endarterectomy  08/30/2011    Procedure: Left ENDARTERECTOMY CAROTID;  Surgeon: Conrad Paducah, MD;  Location: [place];  Service: Vascular;  Laterality: Left;  . Joint replacement      Right partial Knee  . Total knee arthroplasty Left 2011  . Endarterectomy Right 12/18/2012    Procedure: ENDARTERECTOMY CAROTID WITH BOVINE PATCH ANGIOPLASTY;  Surgeon: Conrad , MD;  Location: [place];  Service: Vascular;  Laterality: Right;  . Carotid endarterectomy    . Cataract extraction w/ intraocular lens  implant, bilateral Bilateral spring 2010  . Total knee revision Right 08/06/2013    Procedure: REVISION RIGHT TOTAL KNEE ARTHROPLASTY  ;  Surgeon: Gearlean Alf, MD;  Location: [facility];  Service: Orthopedics;  Laterality: Right;  . Coronary angioplasty with stent placement  11/2001,11/2002, 01/2004      HAS 3 STENTS DONE AT 3 DIFFERENT CATHS - ONE IS DRUG ELUTING STENT  . Cardiac catheterization  09/2004, 08/2005, 10/2007; 01/2009; 11/2010; 07/02/2015  . Colonoscopy  03/2005; 11/2007    "normal;  scattered diverticulosis, internal hemorrhoid"  . Laparoscopic cholecystectomy  10/1993  . Esophagogastroduodenoscopy  12/2005; 11/2007    "normal; inflammation"  . Eye surgery Right 08/2007    vitreous hemorrhage  . Renal cryoablation  10/2010  . Cardiac catheterization N/A 07/02/2015    Procedure: Left Heart Cath and Coronary Angiography;  Surgeon: Jettie Booze, MD;  Location: [place] [facility];  Service: Cardiovascular;  Laterality: N/A;   Family History  Problem Relation Age of Onset  . Coronary artery disease Brother   . Cancer Brother   . Heart disease Brother   . Heart attack Brother   . Anesthesia problems Neg Hx    Social History  Substance Use Topics  . Smoking status: Current Some Day Smoker -- 0.50 packs/day for 66 years     Types: Cigarettes    Start date: 10/20/1943  . Smokeless tobacco: Never Used     Comment: 1 pack every 3-4 days   . Alcohol Use: No    Review of Systems  Constitutional: Positive for chills. Negative for fever, diaphoresis, appetite change and fatigue.  HENT: Negative for mouth sores, sore throat and trouble swallowing.   Eyes: Negative for visual disturbance.  Respiratory: Positive for cough and chest tightness. Negative for shortness of breath and wheezing.   Cardiovascular: Negative for chest pain.  Gastrointestinal: Negative for nausea, vomiting, abdominal pain, diarrhea and abdominal distention.  Endocrine: Negative for polydipsia, polyphagia and polyuria.  Genitourinary: Negative for dysuria, frequency and hematuria.  Musculoskeletal: Negative for gait problem.  Skin: Negative for color change, pallor and rash.  Neurological: Negative for dizziness, syncope, light-headedness and headaches.  Hematological: Does not bruise/bleed easily.  Psychiatric/Behavioral: Negative for behavioral problems and confusion.      Allergies  Hydrocodone-acetaminophen; Oxycodone; Penicillins; Lyrica; Morphine and related; Ciprofloxacin; and Iron  Home Medications   Prior to Admission medications   Medication Sig Start Date End Date Taking? Authorizing Provider  acetaminophen (TYLENOL) 650 MG CR tablet Take 1,300 mg by mouth every 8 (eight) hours as needed for pain.   Yes Historical Provider, MD  albuterol (PROAIR HFA) 108 (90 BASE) MCG/ACT inhaler Inhale 2 puffs into the lungs every 6 (six) hours as needed. For shortness of breath   Yes Historical Provider, MD  amLODipine (NORVASC) 5 MG tablet Take 1 tablet (5 mg total) by mouth every morning. 08/20/15  Yes Satira Sark, MD  aspirin 81 MG tablet Take 81 mg by mouth every morning.    Yes Historical Provider, MD  atorvastatin (LIPITOR) 40 MG tablet Take 40 mg by mouth at bedtime.    Yes Historical Provider, MD  cetirizine (ZYRTEC) 10 MG  tablet Take 10 mg by mouth every morning.    Yes Historical Provider, MD  citalopram (CELEXA) 20 MG tablet Take 20 mg by mouth every morning.    Yes Historical Provider, MD  clobetasol cream (TEMOVATE) 7.09 % Apply 1 application topically daily. Applied to legs for psoriasis   Yes Historical Provider, MD  ferrous sulfate 325 (65 FE) MG tablet Take 325 mg by mouth at bedtime.    Yes Historical Provider, MD  Fluticasone-Salmeterol (ADVAIR DISKUS) 250-50 MCG/DOSE AEPB Inhale 1 puff into the lungs every 12 (twelve) hours.     Yes Historical Provider, MD  levothyroxine (SYNTHROID, LEVOTHROID) 125 MCG tablet Take 125 mcg by mouth every morning.    Yes Historical Provider, MD  [facility] ARTHRITIS PAIN RELIEF) PADS Apply 1 each topically as needed (Shoulder Pain).   Yes Historical  Provider, MD  mometasone (NASONEX) 50 MCG/ACT nasal spray Place 2 sprays into the nose at bedtime. 08/26/15  Yes Satira Sark, MD  Multiple Vitamin (MULTIVITAMIN) tablet Take 1 tablet by mouth every morning.    Yes Historical Provider, MD  nitroGLYCERIN (NITROSTAT) 0.4 MG SL tablet Place 1 tablet (0.4 mg total) under the tongue every 5 (five) minutes as needed. For chest pain 08/27/15  Yes Satira Sark, MD  Omega 3 1200 MG CAPS Take 2 capsules by mouth 2 (two) times daily. Take 2 capsules in AM and 2 capsules in  PM   Yes Historical Provider, MD  ranitidine (ZANTAC) 150 MG tablet Take 150 mg by mouth every morning.    Yes Historical Provider, MD  ranolazine (RANEXA) 500 MG 12 hr tablet Take 1 tablet (500 mg total) by mouth 2 (two) times daily. 08/20/15  Yes Satira Sark, MD  theophylline (THEODUR) 100 MG 12 hr tablet Take 1 tablet (100 mg total) by mouth 2 (two) times daily. 08/26/15  Yes Satira Sark, MD  tiotropium (SPIRIVA) 18 MCG inhalation capsule Place 1 capsule (18 mcg total) into inhaler and inhale daily. 09/21/14  Yes Deneise Lever, MD  traZODone (DESYREL) 100 MG tablet Take 100 mg by mouth at  bedtime as needed. For sleep   Yes Historical Provider, MD  doxycycline (VIBRAMYCIN) 100 MG capsule Take 1 capsule (100 mg total) by mouth 2 (two) times daily. 08/30/15   Tanna Furry, MD  polyethylene glycol [facility] / Floria Raveling) packet Take 17 g by mouth daily as needed for mild constipation. Patient not taking: Reported on 08/30/2015 08/06/13   Ardeen Jourdain, PA-C  predniSONE (DELTASONE) 20 MG tablet Take 1 tablet (20 mg total) by mouth 2 (two) times daily with a meal. 08/30/15   Tanna Furry, MD   BP 138/64 mmHg  Pulse 61  Temp(Src) 98.1 F (36.7 C) (Oral)  Resp 17  Ht '5\' 7"'$  (1.702 m)  Wt 156 lb (70.761 kg)  BMI 24.43 kg/m2  SpO2 93% Physical Exam  Constitutional: He is oriented to person, place, and time. He appears well-developed and well-nourished. No distress.  HENT:  Head: Normocephalic.  Eyes: Conjunctivae are normal. Pupils are equal, round, and reactive to light. No scleral icterus.  Neck: Normal range of motion. Neck supple. No thyromegaly present.  Cardiovascular: Normal rate and regular rhythm.  Exam reveals no gallop and no friction rub.   No murmur heard. Nontender over the chest wall. No crackles. No S3 or 4 gallop. No JVD. No dependent edema.  Pulmonary/Chest: Effort normal. No respiratory distress. He has wheezes. He has no rales.  Wheezing and prolongation in all fields. Overall good air exchange. No focal diminished breath sounds. No increased work of breathing.  Abdominal: Soft. Bowel sounds are normal. He exhibits no distension. There is no tenderness. There is no rebound.  Musculoskeletal: Normal range of motion.  Neurological: He is alert and oriented to person, place, and time.  Skin: Skin is warm and dry. No rash noted.  Psychiatric: He has a normal mood and affect. His behavior is normal.    ED Course  Procedures (including critical care time) Labs Review Labs Reviewed  BASIC METABOLIC PANEL - Abnormal; Notable for the following:    Sodium 125 (*)    Chloride  92 (*)    Glucose, Bld 118 (*)    GFR calc non Af Amer 55 (*)    All other components within normal limits  CBC - Abnormal;  Notable for the following:    WBC 11.1 (*)    All other components within normal limits  TROPONIN I  PROTIME-INR  TROPONIN I    Imaging Review Dg Chest 2 View  08/30/2015  CLINICAL DATA:  Two week history of cough. EXAM: CHEST  2 VIEW COMPARISON:  08/25/2015. FINDINGS: The cardiac silhouette, mediastinal and hilar contours are within normal limits and stable. There is tortuosity and calcification of the thoracic aorta. Patchy right basilar airspace opacity suspicious for pneumonia. The left lung is clear. Chronic underlying bronchitic changes. The bony thorax is intact. IMPRESSION: Right lower lobe infiltrate. Electronically Signed   By: Marijo Sanes M.D.   On: 08/30/2015 14:44   I have personally reviewed and evaluated these images and lab results as part of my medical decision-making.   EKG Interpretation None      MDM   Final diagnoses:  COPD exacerbation ([place])  CAP (community acquired pneumonia)    EKG is unchanged. Does sound consistent with bronchospasm related to his COPD. We'll try albuterol, and IV Solu-Medrol. Serial enzymes. Reevaluation.  On recheck patient states he feels "much better". Minimal index of 3 wheezing. Well oxygenated. After second that he is symptom-free. Repeat troponin is normal. Plan is home, doxycycline, prednisone, albuterol, primary care follow-up. ER with acute changes.   Tanna Furry, MD 08/30/15 [phone]

## 2015-08-30 NOTE — ED Notes (Signed)
Pt c/o sudden onset of LT sided, non-radiating CP that began this morning. Pt also reports SOB, dizziness, and nausea. Pt took 3 nitro PTA with some relief. AOx4.

## 2015-08-30 NOTE — Discharge Instructions (Signed)
Chronic Obstructive Pulmonary Disease Exacerbation Chronic obstructive pulmonary disease (COPD) is a common lung problem. In COPD, the flow of air from the lungs is limited. COPD exacerbations are times that breathing gets worse and you need extra treatment. Without treatment they can be life threatening. If they happen often, your lungs can become more damaged. If your COPD gets worse, your doctor may treat you with:  Medicines.  Oxygen.  Different ways to clear your airway, such as using a mask. HOME CARE  Do not smoke.  Avoid tobacco smoke and other things that bother your lungs.  If given, take your antibiotic medicine as told. Finish the medicine even if you start to feel better.  Only take medicines as told by your doctor.  Drink enough fluids to keep your pee (urine) clear or pale yellow (unless your doctor has told you not to).  Use a cool mist machine (vaporizer).  If you use oxygen or a machine that turns liquid medicine into a mist (nebulizer), continue to use them as told.  Keep up with shots (vaccinations) as told by your doctor.  Exercise regularly.  Eat healthy foods.  Keep all doctor visits as told. GET HELP RIGHT AWAY IF:  You are very short of breath and it gets worse.  You have trouble talking.  You have bad chest pain.  You have blood in your spit (sputum).  You have a fever.  You keep throwing up (vomiting).  You feel weak, or you pass out (faint).  You feel confused.  You keep getting worse. MAKE SURE YOU:  Understand these instructions.  Will watch your condition.  Will get help right away if you are not doing well or get worse.   This information is not intended to replace advice given to you by your health care provider. Make sure you discuss any questions you have with your health care provider.   Document Released: 04/06/2011 Document Revised: 05/08/2014 Document Reviewed: 12/20/2012 Elsevier Interactive Patient Education 2016  [place] Pneumonia, Adult Pneumonia is an infection of the lungs. One type of pneumonia can happen while a person is in a hospital. A different type can happen when a person is not in a hospital (community-acquired pneumonia). It is easy for this kind to spread from person to person. It can spread to you if you breathe near an infected person who coughs or sneezes. Some symptoms include:  A dry cough.  A wet (productive) cough.  Fever.  Sweating.  Chest pain. HOME CARE  Take over-the-counter and prescription medicines only as told by your doctor.  Only take cough medicine if you are losing sleep.  If you were prescribed an antibiotic medicine, take it as told by your doctor. Do not stop taking the antibiotic even if you start to feel better.  Sleep with your head and neck raised (elevated). You can do this by putting a few pillows under your head, or you can sleep in a recliner.  Do not use tobacco products. These include cigarettes, chewing tobacco, and e-cigarettes. If you need help quitting, ask your doctor.  Drink enough water to keep your pee (urine) clear or pale yellow. A shot (vaccine) can help prevent pneumonia. Shots are often suggested for:  People older than 80 years of age.  People older than 80 years of age:  Who are having cancer treatment.  Who have long-term (chronic) lung disease.  Who have problems with their body's defense system (immune system). You may also prevent  pneumonia if you take these actions:  Get the flu (influenza) shot every year.  Go to the dentist as often as told.  Wash your hands often. If soap and water are not available, use hand sanitizer. GET HELP IF:  You have a fever.  You lose sleep because your cough medicine does not help. GET HELP RIGHT AWAY IF:  You are short of breath and it gets worse.  You have more chest pain.  Your sickness gets worse. This is very serious if:  You are an older  adult.  Your body's defense system is weak.  You cough up blood.   This information is not intended to replace advice given to you by your health care provider. Make sure you discuss any questions you have with your health care provider.   Document Released: 10/04/2007 Document Revised: 01/06/2015 Document Reviewed: 08/12/2014 Elsevier Interactive Patient Education [employer].

## 2015-08-31 NOTE — Telephone Encounter (Signed)
Daughter Richrd Prime) notified.

## 2015-09-08 DIAGNOSIS — D49512 Neoplasm of unspecified behavior of left kidney: Secondary | ICD-10-CM | POA: Diagnosis not present

## 2015-09-08 DIAGNOSIS — C61 Malignant neoplasm of prostate: Secondary | ICD-10-CM | POA: Diagnosis not present

## 2015-10-21 DIAGNOSIS — H5203 Hypermetropia, bilateral: Secondary | ICD-10-CM | POA: Diagnosis not present

## 2015-10-21 DIAGNOSIS — H52223 Regular astigmatism, bilateral: Secondary | ICD-10-CM | POA: Diagnosis not present

## 2015-10-21 DIAGNOSIS — H524 Presbyopia: Secondary | ICD-10-CM | POA: Diagnosis not present

## 2015-10-21 DIAGNOSIS — Z961 Presence of intraocular lens: Secondary | ICD-10-CM | POA: Diagnosis not present

## 2015-11-11 DIAGNOSIS — K219 Gastro-esophageal reflux disease without esophagitis: Secondary | ICD-10-CM | POA: Diagnosis not present

## 2015-11-11 DIAGNOSIS — I10 Essential (primary) hypertension: Secondary | ICD-10-CM | POA: Diagnosis not present

## 2015-11-11 DIAGNOSIS — N183 Chronic kidney disease, stage 3 (moderate): Secondary | ICD-10-CM | POA: Diagnosis not present

## 2015-11-11 DIAGNOSIS — E871 Hypo-osmolality and hyponatremia: Secondary | ICD-10-CM | POA: Diagnosis not present

## 2015-11-11 DIAGNOSIS — J441 Chronic obstructive pulmonary disease with (acute) exacerbation: Secondary | ICD-10-CM | POA: Diagnosis not present

## 2015-11-11 DIAGNOSIS — E782 Mixed hyperlipidemia: Secondary | ICD-10-CM | POA: Diagnosis not present

## 2015-11-11 DIAGNOSIS — E039 Hypothyroidism, unspecified: Secondary | ICD-10-CM | POA: Diagnosis not present

## 2015-11-17 DIAGNOSIS — E039 Hypothyroidism, unspecified: Secondary | ICD-10-CM | POA: Diagnosis not present

## 2015-11-17 DIAGNOSIS — I10 Essential (primary) hypertension: Secondary | ICD-10-CM | POA: Diagnosis not present

## 2015-11-17 DIAGNOSIS — K219 Gastro-esophageal reflux disease without esophagitis: Secondary | ICD-10-CM | POA: Diagnosis not present

## 2015-11-17 DIAGNOSIS — N183 Chronic kidney disease, stage 3 (moderate): Secondary | ICD-10-CM | POA: Diagnosis not present

## 2015-11-17 DIAGNOSIS — Z6823 Body mass index (BMI) 23.0-23.9, adult: Secondary | ICD-10-CM | POA: Diagnosis not present

## 2015-11-17 DIAGNOSIS — J439 Emphysema, unspecified: Secondary | ICD-10-CM | POA: Diagnosis not present

## 2015-11-17 DIAGNOSIS — F329 Major depressive disorder, single episode, unspecified: Secondary | ICD-10-CM | POA: Diagnosis not present

## 2015-11-17 DIAGNOSIS — E782 Mixed hyperlipidemia: Secondary | ICD-10-CM | POA: Diagnosis not present

## 2015-11-22 ENCOUNTER — Encounter: Payer: Self-pay | Admitting: Cardiology

## 2015-11-22 ENCOUNTER — Ambulatory Visit (INDEPENDENT_AMBULATORY_CARE_PROVIDER_SITE_OTHER): Payer: Medicare Other | Admitting: Cardiology

## 2015-11-22 VITALS — BP 126/67 | HR 80 | Ht 67.0 in | Wt 148.6 lb

## 2015-11-22 DIAGNOSIS — I6523 Occlusion and stenosis of bilateral carotid arteries: Secondary | ICD-10-CM

## 2015-11-22 DIAGNOSIS — I10 Essential (primary) hypertension: Secondary | ICD-10-CM | POA: Diagnosis not present

## 2015-11-22 DIAGNOSIS — E782 Mixed hyperlipidemia: Secondary | ICD-10-CM | POA: Diagnosis not present

## 2015-11-22 DIAGNOSIS — I25119 Atherosclerotic heart disease of native coronary artery with unspecified angina pectoris: Secondary | ICD-10-CM

## 2015-11-22 DIAGNOSIS — J439 Emphysema, unspecified: Secondary | ICD-10-CM

## 2015-11-22 NOTE — Patient Instructions (Signed)

## 2015-11-22 NOTE — Progress Notes (Signed)
Cardiology Office Note  Date: 11/22/2015   ID: Michael [name], DOB April 09, 1932, MRN [id number]  PCP: Curlene Labrum, MD  Primary Cardiologist: Rozann Lesches, MD   Chief Complaint  Patient presents with  . Coronary Artery Disease    History of Present Illness: Michael [name] is an 82 y.o. male last seen in April. He is here today with a family member for a routine follow-up visit. Fortunately, he remains stable from a cardiac perspective without significant angina symptoms on his current regimen.  Interval records reviewed. He was seen in the ER in early May with chest tightness and wheezing. He was diagnosed with mild COPD exacerbation that was treated in the ER and discharged. Troponin I levels were normal. I reviewed his ECG as outlined below.  He does not report any nitroglycerin use. Most recent antianginal addition was Ranexa.  Cardiac catheterization from March is detailed below. We discussed continuing medical therapy and observation for now.  Past Medical History:  Diagnosis Date  . Allergic rhinitis   . Anemia   . Anginal pain ([place])   . Anxiety   . Arthritis    "shoulders, knees mostly" (07/02/2015)  . Carotid artery occlusion   . COPD (chronic obstructive pulmonary disease) ([place])   . Coronary atherosclerosis of native coronary artery    BMS RCA 2003, DES RCA 2004, DES RCA 2005   . Depression   . Essential hypertension, benign   . GERD (gastroesophageal reflux disease)   . Hemorrhoids   . History of asbestos exposure   . History of herpes zoster 2001  . HOH (hard of hearing)   . Hyperthyroidism 1978   Status post radioactive iodine treatment  . Hypothyroidism   . Lumbar pain   . Mixed hyperlipidemia   . Myocardial infarction [facility])    "during 2nd or 3rd stent"  . Peripheral arterial disease ([place])   . Pneumonia November 2014  . Prostate cancer [facility])    Status post XRT/IMRT 2008 - Dr. Alinda Money  . Psoriasis   . Renal cancer ([place])    Cryoablation in 2012     Past Surgical History:  Procedure Laterality Date  . ANTERIOR CERVICAL DECOMP/DISCECTOMY FUSION  07/2002  . BACK SURGERY  07/2002  . CARDIAC CATHETERIZATION  09/2004, 08/2005, 10/2007; 01/2009; 11/2010; 07/02/2015  . CARDIAC CATHETERIZATION N/A 07/02/2015   Procedure: Left Heart Cath and Coronary Angiography;  Surgeon: Jettie Booze, MD;  Location: [place] [facility];  Service: Cardiovascular;  Laterality: N/A;  . CAROTID ENDARTERECTOMY    . CATARACT EXTRACTION W/ INTRAOCULAR LENS  IMPLANT, BILATERAL Bilateral spring 2010  . COLONOSCOPY  03/2005; 11/2007   "normal; scattered diverticulosis, internal hemorrhoid"  . CORONARY ANGIOPLASTY WITH STENT PLACEMENT  11/2001,11/2002, 01/2004     HAS 3 STENTS DONE AT 3 DIFFERENT CATHS - ONE IS DRUG ELUTING STENT  . ENDARTERECTOMY  08/30/2011   Procedure: Left ENDARTERECTOMY CAROTID;  Surgeon: Conrad Cl[place] Hill, MD;  Location: [place];  Service: Vascular;  Laterality: Left;  . ENDARTERECTOMY Right 12/18/2012   Procedure: ENDARTERECTOMY CAROTID WITH BOVINE PATCH ANGIOPLASTY;  Surgeon: Conrad Why, MD;  Location: [place];  Service: Vascular;  Laterality: Right;  . ESOPHAGOGASTRODUODENOSCOPY  12/2005; 11/2007   "normal; inflammation"  . EYE SURGERY Right 08/2007   vitreous hemorrhage  . JOINT REPLACEMENT     Right partial Knee  . KNEE ARTHROPLASTY     bilateral  . LAPAROSCOPIC CHOLECYSTECTOMY  10/1993  . NASAL SEPTUM SURGERY  SMR  . PARTIAL KNEE ARTHROPLASTY Right 10/2003  . POPLITEAL SYNOVIAL CYST EXCISION    . RENAL CRYOABLATION  10/2010  . TOTAL KNEE ARTHROPLASTY Left 2011  . TOTAL KNEE REVISION Right 08/06/2013   Procedure: REVISION RIGHT TOTAL KNEE ARTHROPLASTY  ;  Surgeon: Gearlean Alf, MD;  [facility];  Service: Orthopedics;  Laterality: Right;    Current Outpatient Prescriptions  Medication Sig Dispense Refill  . acetaminophen (TYLENOL) 650 MG CR tablet Take 1,300 mg by mouth every 8 (eight) hours as needed for pain.    Marland Kitchen albuterol (PROAIR  HFA) 108 (90 BASE) MCG/ACT inhaler Inhale 2 puffs into the lungs every 6 (six) hours as needed. For shortness of breath    . amLODipine (NORVASC) 5 MG tablet Take 1 tablet (5 mg total) by mouth every morning. 90 tablet 3  . aspirin 81 MG tablet Take 81 mg by mouth every morning.     Marland Kitchen atorvastatin (LIPITOR) 40 MG tablet Take 40 mg by mouth at bedtime.     . cetirizine (ZYRTEC) 10 MG tablet Take 10 mg by mouth every morning.     . citalopram (CELEXA) 40 MG tablet Take 1 tablet by mouth daily.    . clobetasol cream (TEMOVATE) 8.29 % Apply 1 application topically daily. Applied to legs for psoriasis    . ferrous sulfate 325 (65 FE) MG tablet Take 325 mg by mouth at bedtime.     . Fluticasone-Salmeterol (ADVAIR DISKUS) 250-50 MCG/DOSE AEPB Inhale 1 puff into the lungs every 12 (twelve) hours.      Marland Kitchen levothyroxine (SYNTHROID, LEVOTHROID) 125 MCG tablet Take 125 mcg by mouth every morning.     . Liniments (SALONPAS ARTHRITIS PAIN RELIEF) PADS Apply 1 each topically as needed (Shoulder Pain).    . mometasone (NASONEX) 50 MCG/ACT nasal spray Place 2 sprays into the nose at bedtime.    . Multiple Vitamin (MULTIVITAMIN) tablet Take 1 tablet by mouth every morning.     . nitroGLYCERIN (NITROSTAT) 0.4 MG SL tablet Place 1 tablet (0.4 mg total) under the tongue every 5 (five) minutes as needed. For chest pain 25 tablet 3  . polyethylene glycol (MIRALAX / GLYCOLAX) packet Take 17 g by mouth daily as needed for mild constipation. 14 each 0  . ranitidine (ZANTAC) 150 MG tablet Take 150 mg by mouth every morning.     . ranolazine (RANEXA) 500 MG 12 hr tablet Take 1 tablet (500 mg total) by mouth 2 (two) times daily. 180 tablet 3  . traZODone (DESYREL) 100 MG tablet Take 100 mg by mouth at bedtime as needed. For sleep     No current facility-administered medications for this visit.    Allergies:  Hydrocodone-acetaminophen; Oxycodone; Penicillins; Lyrica [pregabalin]; Morphine and related; Ciprofloxacin; and Iron     Social History: The patient  reports that he has been smoking Cigarettes.  He started smoking about 72 years ago. He has a 33.00 pack-year smoking history. He has never used smokeless tobacco. He reports that he does not drink alcohol or use drugs.   ROS:  Please see the history of present illness. Otherwise, complete review of systems is positive for chronic dyspnea on exertion, some memory deficits.  All other systems are reviewed and negative.   Physical Exam: VS:  BP 126/67   Pulse 80   Ht '5\' 7"'$  (1.702 m)   Wt 148 lb 9.6 oz (67.4 kg)   SpO2 97%   BMI 23.27 kg/m , BMI Body mass  index is 23.27 kg/m.  Wt Readings from Last 3 Encounters:  11/22/15 148 lb 9.6 oz (67.4 kg)  08/30/15 156 lb (70.8 kg)  08/20/15 147 lb 12.8 oz (67 kg)    General: Elderly male, appears comfortable at rest. HEENT: Conjunctiva and lids normal, oropharynx clear. Neck: Supple, no elevated JVP, bilateral CEA scars, no thyromegaly. Lungs: Diminished breath sounds without wheezing, nonlabored breathing at rest. Cardiac: Regular rate and rhythm, no S3 or significant systolic murmur, no pericardial rub. Abdomen: Soft, nontender, bowel sounds present, no guarding or rebound. Extremities: No pitting edema, distal pulses 2+. Skin: Warm and dry. Musculoskeletal: No kyphosis Neuropsychiatric: Alert and oriented 3, affect appropriate.  ECG: I personally reviewed the tracing from 08/30/2015 which showed sinus rhythm with prolonged PR interval, left anterior fascicular block, and poor anterior R-wave progression.  Recent Labwork: 08/30/2015: BUN 13; Creatinine, Ser 1.19; Hemoglobin 14.0; Platelets 260; Potassium 3.9; Sodium 125     Component Value Date/Time   CHOL 122 02/16/2014 0124   TRIG 74 02/16/2014 0124   HDL 67 02/16/2014 0124   CHOLHDL 1.8 02/16/2014 0124   VLDL 15 02/16/2014 0124   LDLCALC 40 02/16/2014 0124    Other Studies Reviewed Today:  Cardiac catheterization 07/02/2015:  Patent stents in the  RCA with only mild instent restenosis.  Nononbstructive disease in the LAD and circumflex.  The left ventricular systolic function is normal.  Normal LVEDP.  Chest x-ray 08/30/2015: FINDINGS: The cardiac silhouette, mediastinal and hilar contours are within normal limits and stable. There is tortuosity and calcification of the thoracic aorta. Patchy right basilar airspace opacity suspicious for pneumonia. The left lung is clear. Chronic underlying bronchitic changes.  The bony thorax is intact.  IMPRESSION: Right lower lobe infiltrate.  Assessment and Plan:  1. CAD with recently documented patent RCA stents associated with only mild in-stent restenosis and otherwise nonobstructive LAD and circumflex disease. Plan is to continue medical therapy and observation.  2. COPD with recurrent exacerbations. He continues to follow with Dr. Pleas Koch. He has a long-standing history of tobacco abuse and has not been able to quit.  3. Hyperlipidemia, continues on Lipitor with good LDL control over time.  4. Essential hypertension, blood pressure is well controlled today.  Current medicines were reviewed with the patient today.  Disposition: Follow-up with me in 6 months.  Signed, Satira Sark, MD, [facility] 11/22/2015 3:15 PM    [facility] [place], [place], [place] [address] Phone: [phone]; Fax: [phone]

## 2015-12-29 ENCOUNTER — Other Ambulatory Visit: Payer: Self-pay

## 2016-01-31 DIAGNOSIS — I10 Essential (primary) hypertension: Secondary | ICD-10-CM | POA: Diagnosis not present

## 2016-01-31 DIAGNOSIS — J439 Emphysema, unspecified: Secondary | ICD-10-CM | POA: Diagnosis not present

## 2016-01-31 DIAGNOSIS — Z6823 Body mass index (BMI) 23.0-23.9, adult: Secondary | ICD-10-CM | POA: Diagnosis not present

## 2016-01-31 DIAGNOSIS — E782 Mixed hyperlipidemia: Secondary | ICD-10-CM | POA: Diagnosis not present

## 2016-01-31 DIAGNOSIS — E039 Hypothyroidism, unspecified: Secondary | ICD-10-CM | POA: Diagnosis not present

## 2016-01-31 DIAGNOSIS — F329 Major depressive disorder, single episode, unspecified: Secondary | ICD-10-CM | POA: Diagnosis not present

## 2016-01-31 DIAGNOSIS — N183 Chronic kidney disease, stage 3 (moderate): Secondary | ICD-10-CM | POA: Diagnosis not present

## 2016-01-31 DIAGNOSIS — Z23 Encounter for immunization: Secondary | ICD-10-CM | POA: Diagnosis not present

## 2016-02-03 DIAGNOSIS — R410 Disorientation, unspecified: Secondary | ICD-10-CM | POA: Diagnosis not present

## 2016-02-03 DIAGNOSIS — R413 Other amnesia: Secondary | ICD-10-CM | POA: Diagnosis not present

## 2016-02-03 DIAGNOSIS — F0390 Unspecified dementia without behavioral disturbance: Secondary | ICD-10-CM | POA: Diagnosis not present

## 2016-02-22 ENCOUNTER — Encounter: Payer: Self-pay | Admitting: Family

## 2016-02-25 ENCOUNTER — Ambulatory Visit: Payer: Self-pay | Admitting: Family

## 2016-02-25 ENCOUNTER — Encounter (HOSPITAL_COMMUNITY): Payer: Self-pay

## 2016-03-01 ENCOUNTER — Emergency Department (HOSPITAL_COMMUNITY): Payer: Medicare Other

## 2016-03-01 ENCOUNTER — Emergency Department (HOSPITAL_COMMUNITY)
Admission: EM | Admit: 2016-03-01 | Discharge: 2016-03-01 | Disposition: A | Discharge status: Home / Self Care | Payer: Medicare Other | Attending: Emergency Medicine | Admitting: Emergency Medicine

## 2016-03-01 ENCOUNTER — Encounter (HOSPITAL_COMMUNITY): Payer: Self-pay

## 2016-03-01 DIAGNOSIS — F17210 Nicotine dependence, cigarettes, uncomplicated: Secondary | ICD-10-CM | POA: Diagnosis not present

## 2016-03-01 DIAGNOSIS — Z7982 Long term (current) use of aspirin: Secondary | ICD-10-CM | POA: Insufficient documentation

## 2016-03-01 DIAGNOSIS — R42 Dizziness and giddiness: Secondary | ICD-10-CM

## 2016-03-01 DIAGNOSIS — I2510 Atherosclerotic heart disease of native coronary artery without angina pectoris: Secondary | ICD-10-CM | POA: Insufficient documentation

## 2016-03-01 DIAGNOSIS — R079 Chest pain, unspecified: Secondary | ICD-10-CM | POA: Diagnosis not present

## 2016-03-01 DIAGNOSIS — J449 Chronic obstructive pulmonary disease, unspecified: Secondary | ICD-10-CM | POA: Insufficient documentation

## 2016-03-01 DIAGNOSIS — Z79899 Other long term (current) drug therapy: Secondary | ICD-10-CM | POA: Diagnosis not present

## 2016-03-01 DIAGNOSIS — Z8546 Personal history of malignant neoplasm of prostate: Secondary | ICD-10-CM | POA: Diagnosis not present

## 2016-03-01 DIAGNOSIS — Z85528 Personal history of other malignant neoplasm of kidney: Secondary | ICD-10-CM | POA: Insufficient documentation

## 2016-03-01 DIAGNOSIS — E039 Hypothyroidism, unspecified: Secondary | ICD-10-CM | POA: Insufficient documentation

## 2016-03-01 DIAGNOSIS — J069 Acute upper respiratory infection, unspecified: Secondary | ICD-10-CM | POA: Insufficient documentation

## 2016-03-01 DIAGNOSIS — R0981 Nasal congestion: Secondary | ICD-10-CM | POA: Diagnosis present

## 2016-03-01 DIAGNOSIS — R0602 Shortness of breath: Secondary | ICD-10-CM | POA: Diagnosis not present

## 2016-03-01 LAB — CBC WITH DIFFERENTIAL/PLATELET
BASOS PCT: 1 %
Basophils Absolute: 0.1 10*3/uL (ref 0.0–0.1)
EOS ABS: 0.3 10*3/uL (ref 0.0–0.7)
Eosinophils Relative: 3 %
HCT: 34.7 % — ABNORMAL LOW (ref 39.0–52.0)
HEMOGLOBIN: 11.9 g/dL — AB (ref 13.0–17.0)
Lymphocytes Relative: 17 %
Lymphs Abs: 1.4 10*3/uL (ref 0.7–4.0)
MCH: 32.6 pg (ref 26.0–34.0)
MCHC: 34.3 g/dL (ref 30.0–36.0)
MCV: 95.1 fL (ref 78.0–100.0)
Monocytes Absolute: 0.7 10*3/uL (ref 0.1–1.0)
Monocytes Relative: 8 %
NEUTROS PCT: 71 %
Neutro Abs: 5.8 10*3/uL (ref 1.7–7.7)
Platelets: 254 10*3/uL (ref 150–400)
RBC: 3.65 MIL/uL — AB (ref 4.22–5.81)
RDW: 14.3 % (ref 11.5–15.5)
WBC: 8.2 10*3/uL (ref 4.0–10.5)

## 2016-03-01 LAB — URINALYSIS, ROUTINE W REFLEX MICROSCOPIC
BILIRUBIN URINE: NEGATIVE
Glucose, UA: NEGATIVE mg/dL
Hgb urine dipstick: NEGATIVE
Ketones, ur: NEGATIVE mg/dL
Leukocytes, UA: NEGATIVE
NITRITE: NEGATIVE
Protein, ur: NEGATIVE mg/dL
SPECIFIC GRAVITY, URINE: 1.01 (ref 1.005–1.030)
pH: 6.5 (ref 5.0–8.0)

## 2016-03-01 LAB — COMPREHENSIVE METABOLIC PANEL
ALK PHOS: 60 U/L (ref 38–126)
ALT: 14 U/L — AB (ref 17–63)
AST: 18 U/L (ref 15–41)
Albumin: 3.2 g/dL — ABNORMAL LOW (ref 3.5–5.0)
Anion gap: 3 — ABNORMAL LOW (ref 5–15)
BUN: 14 mg/dL (ref 6–20)
CALCIUM: 8.5 mg/dL — AB (ref 8.9–10.3)
CO2: 27 mmol/L (ref 22–32)
CREATININE: 1.09 mg/dL (ref 0.61–1.24)
Chloride: 102 mmol/L (ref 101–111)
Glucose, Bld: 123 mg/dL — ABNORMAL HIGH (ref 65–99)
Potassium: 3.6 mmol/L (ref 3.5–5.1)
Sodium: 132 mmol/L — ABNORMAL LOW (ref 135–145)
Total Bilirubin: 0.4 mg/dL (ref 0.3–1.2)
Total Protein: 6 g/dL — ABNORMAL LOW (ref 6.5–8.1)

## 2016-03-01 MED ORDER — SODIUM CHLORIDE 0.9 % IV BOLUS (SEPSIS)
500.0000 mL | Freq: Once | INTRAVENOUS | Status: AC
  Administered 2016-03-01: 500 mL via INTRAVENOUS

## 2016-03-01 MED ORDER — MECLIZINE HCL 12.5 MG PO TABS: 12.5000 mg | ORAL_TABLET | Freq: Three times a day (TID) | ORAL | 0 refills | Status: DC | PRN

## 2016-03-01 NOTE — ED Provider Notes (Addendum)
[facility] DEPT Provider Note   CSN: [id number] Arrival date & time: 03/01/16  1302     History   Chief Complaint Chief Complaint  Patient presents with  . Dizziness    HPI Michael [name] is a 82 y.o. male.  HPI Patient presents with dizziness. States he's had head congestion for last few days. States he hurtsin his head. States that now he feels dizzy. States he feels bad when he stands up. States he feels lightheaded dizzy and that the room was spinning. Unable to differentiate much more clearly than that. No localizing numbness or weakness. Has a history of COPD but states breathing has been doing okay. No chest pain. No diarrhea. Recently started on Aricept for dementia. No abdominal pain. Has been eating somewhat less.     Past Medical History:  Diagnosis Date  . Allergic rhinitis   . Anemia   . Anginal pain ([place])   . Anxiety   . Arthritis    "shoulders, knees mostly" (07/02/2015)  . Carotid artery occlusion   . COPD (chronic obstructive pulmonary disease) ([place])   . Coronary atherosclerosis of native coronary artery    BMS RCA 2003, DES RCA 2004, DES RCA 2005   . Depression   . Essential hypertension, benign   . GERD (gastroesophageal reflux disease)   . Hemorrhoids   . History of asbestos exposure   . History of herpes zoster 2001  . HOH (hard of hearing)   . Hyperthyroidism 1978   Status post radioactive iodine treatment  . Hypothyroidism   . Lumbar pain   . Mixed hyperlipidemia   . Myocardial infarction    "during 2nd or 3rd stent"  . Peripheral arterial disease ([place])   . Pneumonia November 2014  . Prostate cancer [facility])    Status post XRT/IMRT 2008 - Dr. Alinda Money  . Psoriasis   . Renal cancer [facility])    Cryoablation in 2012    Patient Active Problem List   Diagnosis Date Noted  . Unstable angina ([place])   . Adrenal gland cyst ([place])   . Primary renal papillary carcinoma ([place])   . Acute chest pain 09/06/2014  . Chest pain at rest   . Essential  hypertension   . Dyslipidemia   . Other specified hypothyroidism   . Carotid stenosis 02/20/2014  . Chest pain 02/15/2014  . Postoperative anemia due to acute blood loss 08/07/2013  . Pain due to unicompartmental arthroplasty of knee ([place]) 08/06/2013  . Preoperative cardiovascular examination 06/18/2013  . Carotid stenosis, bilateral 08/04/2011  . Carotid artery obstruction 02/03/2011  . TOBACCO USER 10/08/2009  . Clinical depression 02/25/2009  . CAD, NATIVE VESSEL 12/22/2008  . PVD 12/22/2008  . Peripheral blood vessel disorder ([place]) 12/22/2008  . COPD with emphysema ([place]) 11/29/2007  . History of asbestos exposure 11/29/2007    Past Surgical History:  Procedure Laterality Date  . ANTERIOR CERVICAL DECOMP/DISCECTOMY FUSION  07/2002  . BACK SURGERY  07/2002  . CARDIAC CATHETERIZATION  09/2004, 08/2005, 10/2007; 01/2009; 11/2010; 07/02/2015  . CARDIAC CATHETERIZATION N/A 07/02/2015   Procedure: Left Heart Cath and Coronary Angiography;  Surgeon: Jettie Booze, MD;  Location: [facility];  Service: Cardiovascular;  Laterality: N/A;  . CAROTID ENDARTERECTOMY    . CATARACT EXTRACTION W/ INTRAOCULAR LENS  IMPLANT, BILATERAL Bilateral spring 2010  . COLONOSCOPY  03/2005; 11/2007   "normal; scattered diverticulosis, internal hemorrhoid"  . CORONARY ANGIOPLASTY WITH STENT PLACEMENT  11/2001,11/2002, 01/2004     HAS 3 STENTS  DONE AT 3 DIFFERENT CATHS - ONE IS DRUG ELUTING STENT  . ENDARTERECTOMY  08/30/2011   Procedure: Left ENDARTERECTOMY CAROTID;  Surgeon: Conrad Duarte, MD;  Location: [facility];  Service: Vascular;  Laterality: Left;  . ENDARTERECTOMY Right 12/18/2012   Procedure: ENDARTERECTOMY CAROTID WITH BOVINE PATCH ANGIOPLASTY;  Surgeon: Conrad Round Hill, MD;  Location: [facility];  Service: Vascular;  Laterality: Right;  . ESOPHAGOGASTRODUODENOSCOPY  12/2005; 11/2007   "normal; inflammation"  . EYE SURGERY Right 08/2007   vitreous hemorrhage  . JOINT REPLACEMENT     Right partial Knee  . KNEE  ARTHROPLASTY     bilateral  . LAPAROSCOPIC CHOLECYSTECTOMY  10/1993  . NASAL SEPTUM SURGERY     SMR  . PARTIAL KNEE ARTHROPLASTY Right 10/2003  . POPLITEAL SYNOVIAL CYST EXCISION    . RENAL CRYOABLATION  10/2010  . TOTAL KNEE ARTHROPLASTY Left 2011  . TOTAL KNEE REVISION Right 08/06/2013   Procedure: REVISION RIGHT TOTAL KNEE ARTHROPLASTY  ;  Surgeon: Gearlean Alf, MD;  Location[facility];  Service: Orthopedics;  Laterality: Right;       Home Medications    Prior to Admission medications   Medication Sig Start Date End Date Taking? Authorizing Provider  acetaminophen (TYLENOL) 650 MG CR tablet Take 1,300 mg by mouth every 8 (eight) hours as needed for pain.   Yes Historical Provider, MD  albuterol (PROAIR HFA) 108 (90 BASE) MCG/ACT inhaler Inhale 2 puffs into the lungs every 6 (six) hours as needed. For shortness of breath   Yes Historical Provider, MD  amLODipine (NORVASC) 5 MG tablet Take 1 tablet (5 mg total) by mouth every morning. 08/20/15  Yes Satira Sark, MD  aspirin 81 MG tablet Take 81 mg by mouth every morning.    Yes Historical Provider, MD  atorvastatin (LIPITOR) 40 MG tablet Take 40 mg by mouth at bedtime.    Yes Historical Provider, MD  cetirizine (ZYRTEC) 10 MG tablet Take 10 mg by mouth every morning.    Yes Historical Provider, MD  citalopram (CELEXA) 40 MG tablet Take 1 tablet by mouth daily. 11/17/15  Yes Historical Provider, MD  clobetasol cream (TEMOVATE) 4.09 % Apply 1 application topically daily. Applied to legs for psoriasis   Yes Historical Provider, MD  donepezil (ARICEPT) 5 MG tablet Take 5 mg by mouth daily.    Yes Historical Provider, MD  ferrous sulfate 325 (65 FE) MG tablet Take 325 mg by mouth at bedtime.    Yes Historical Provider, MD  Fluticasone-Salmeterol (ADVAIR DISKUS) 250-50 MCG/DOSE AEPB Inhale 1 puff into the lungs every 12 (twelve) hours.     Yes Historical Provider, MD  levothyroxine (SYNTHROID, LEVOTHROID) 125 MCG tablet Take 125 mcg by mouth  every morning.    Yes Historical Provider, MD  Liniments [facility] ARTHRITIS PAIN RELIEF) PADS Apply 1 each topically as needed (Shoulder Pain).   Yes Historical Provider, MD  mometasone (NASONEX) 50 MCG/ACT nasal spray Place 2 sprays into the nose at bedtime. 08/26/15  Yes Satira Sark, MD  Multiple Vitamin (MULTIVITAMIN) tablet Take 1 tablet by mouth every morning.    Yes Historical Provider, MD  nitroGLYCERIN (NITROSTAT) 0.4 MG SL tablet Place 1 tablet (0.4 mg total) under the tongue every 5 (five) minutes as needed. For chest pain 08/27/15  Yes Satira Sark, MD  polyethylene glycol [facility] / GLYCOLAX) packet Take 17 g by mouth daily as needed for mild constipation. 08/06/13  Yes Amber Cecilio Asper, PA-C  ranitidine (ZANTAC)  150 MG tablet Take 150 mg by mouth every morning.    Yes Historical Provider, MD  ranolazine (RANEXA) 500 MG 12 hr tablet Take 1 tablet (500 mg total) by mouth 2 (two) times daily. 08/20/15  Yes Satira Sark, MD  traZODone (DESYREL) 100 MG tablet Take 100 mg by mouth at bedtime as needed. For sleep   Yes Historical Provider, MD    Family History Family History  Problem Relation Age of Onset  . Coronary artery disease Brother   . Cancer Brother   . Heart disease Brother   . Heart attack Brother   . Anesthesia problems Neg Hx     Social History Social History  Substance Use Topics  . Smoking status: Current Every Day Smoker    Packs/day: 0.50    Years: 66.00    Types: Cigarettes    Start date: 10/20/1943  . Smokeless tobacco: Never Used     Comment: 1 pack every 3-4 days   . Alcohol use No     Allergies   Hydrocodone-acetaminophen; Oxycodone; Penicillins; Lyrica [pregabalin]; Morphine and related; Ciprofloxacin; and Iron   Review of Systems Review of Systems   Physical Exam Updated Vital Signs BP 150/58   Pulse (!) 59   Temp 98.3 F (36.8 C) (Temporal)   Resp 19   Ht '5\' 8"'$  (1.727 m)   Wt 156 lb (70.8 kg)   SpO2 98%   BMI 23.72 kg/m    Physical Exam  Constitutional: He appears well-developed.  HENT:  Head: Atraumatic.  Eyes: Pupils are equal, round, and reactive to light.  Neck: Normal range of motion.  Cardiovascular: Normal rate.   Pulmonary/Chest:  Mildly harsh breath sounds  Abdominal: Soft.  Musculoskeletal: He exhibits no edema.  Neurological:  Extraocular was intact. No focal nystagmus. Finger-nose intact bilaterally. Patient does get dizziness with sitting up. Good grip strength bilaterally.  Skin: Skin is warm. Capillary refill takes less than 2 seconds.  Psychiatric: His behavior is normal.     ED Treatments / Results  Labs (all labs ordered are listed, but only abnormal results are displayed) Labs Reviewed  COMPREHENSIVE METABOLIC PANEL - Abnormal; Notable for the following:       Result Value   Sodium 132 (*)    Glucose, Bld 123 (*)    Calcium 8.5 (*)    Total Protein 6.0 (*)    Albumin 3.2 (*)    ALT 14 (*)    Anion gap 3 (*)    All other components within normal limits  CBC WITH DIFFERENTIAL/PLATELET - Abnormal; Notable for the following:    RBC 3.65 (*)    Hemoglobin 11.9 (*)    HCT 34.7 (*)    All other components within normal limits  URINALYSIS, ROUTINE W REFLEX MICROSCOPIC (NOT AT [facility])    EKG  EKG Interpretation  Date/Time:  Wednesday March 01 2016 14:24:11 EDT Ventricular Rate:  55 PR Interval:    QRS Duration: 100 QT Interval:  416 QTC Calculation: 398 R Axis:   -39 Text Interpretation:  Sinus rhythm Ventricular premature complex Prolonged PR interval Left axis deviation Low voltage, precordial leads Minimal ST depression, inferior leads Baseline wander in lead(s) V3 Confirmed by Alvino Chapel  MD, Sherril Shipman [id number]) on 03/01/2016 2:53:53 PM       Radiology Dg Chest 2 View  Result Date: 03/01/2016 CLINICAL DATA:  Shortness of breath and chest pain. History of renal and prostate carcinoma EXAM: CHEST  2 VIEW COMPARISON:  Aug 30, 2015  FINDINGS: There is bibasilar fibrotic  type change. There is also mild fibrosis in the periphery of the right mid lung. There is no frank edema or consolidation. Heart size and pulmonary vascularity are normal. No adenopathy. There is atherosclerotic calcification in the aorta. There is anterior wedging of two mid thoracic vertebral bodies. No blastic or lytic bone lesions are evident. There is postoperative change in the lower cervical spine. IMPRESSION: Areas of fibrosis. No edema or consolidation. No mass or adenopathy evident. There is aortic atherosclerosis. Bony structures appear stable. Electronically Signed   By: Lowella Grip III M.D.   On: 03/01/2016 14:16    Procedures Procedures (including critical care time)  Medications Ordered in ED Medications  sodium chloride 0.9 % bolus 500 mL (500 mLs Intravenous New Bag/Given 03/01/16 1437)     Initial Impression / Assessment and Plan / ED Course  I have reviewed the triage vital signs and the nursing notes.  Pertinent labs & imaging results that were available during my care of the patient were reviewed by me and considered in my medical decision making (see chart for details).  Clinical Course    Patient presents with generalized weakness and lightheadedness. Questionable dizziness/vertigo with it. Has had URI type symptoms with head congestion. Has history of COPD. Labs overall reassuring.Mild anemia but denies black stools. Will need outpatient follow-up. Urine pending. Will give IV fluid bolus and reevaluate. Possible discharge home versus MRI to evaluate for central cause of the lightheadedness. Care turned over to Dr Gilford Raid  Final Clinical Impressions(s) / ED Diagnoses   Final diagnoses:  Upper respiratory tract infection, unspecified type  Dizziness    New Prescriptions New Prescriptions   No medications on file     Davonna Belling, MD 03/01/16 [place], MD 03/01/16 [place], MD 03/29/16 2315

## 2016-03-01 NOTE — ED Triage Notes (Signed)
Reports of dizziness since last night with nasal congestion. Denies any visual changes. Also reports of fatigue. Denies any pain or fevers.

## 2016-03-01 NOTE — ED Provider Notes (Signed)
Pt signed out to me by Dr. Alvino Chapel.  Pt is feeling much better after IVFs.  He does not feel dizzy at all.  He is able to ambulate without problems.   Isla Pence, MD 03/01/16 [phone]

## 2016-04-19 DIAGNOSIS — M47812 Spondylosis without myelopathy or radiculopathy, cervical region: Secondary | ICD-10-CM | POA: Diagnosis not present

## 2016-04-19 DIAGNOSIS — M9901 Segmental and somatic dysfunction of cervical region: Secondary | ICD-10-CM | POA: Diagnosis not present

## 2016-04-19 DIAGNOSIS — M5032 Other cervical disc degeneration, mid-cervical region, unspecified level: Secondary | ICD-10-CM | POA: Diagnosis not present

## 2016-04-28 DIAGNOSIS — M9901 Segmental and somatic dysfunction of cervical region: Secondary | ICD-10-CM | POA: Diagnosis not present

## 2016-04-28 DIAGNOSIS — M47812 Spondylosis without myelopathy or radiculopathy, cervical region: Secondary | ICD-10-CM | POA: Diagnosis not present

## 2016-04-28 DIAGNOSIS — M5032 Other cervical disc degeneration, mid-cervical region, unspecified level: Secondary | ICD-10-CM | POA: Diagnosis not present

## 2016-05-03 DIAGNOSIS — M5032 Other cervical disc degeneration, mid-cervical region, unspecified level: Secondary | ICD-10-CM | POA: Diagnosis not present

## 2016-05-03 DIAGNOSIS — M9901 Segmental and somatic dysfunction of cervical region: Secondary | ICD-10-CM | POA: Diagnosis not present

## 2016-05-03 DIAGNOSIS — M47812 Spondylosis without myelopathy or radiculopathy, cervical region: Secondary | ICD-10-CM | POA: Diagnosis not present

## 2016-05-10 DIAGNOSIS — M47812 Spondylosis without myelopathy or radiculopathy, cervical region: Secondary | ICD-10-CM | POA: Diagnosis not present

## 2016-05-10 DIAGNOSIS — M5032 Other cervical disc degeneration, mid-cervical region, unspecified level: Secondary | ICD-10-CM | POA: Diagnosis not present

## 2016-05-10 DIAGNOSIS — M9901 Segmental and somatic dysfunction of cervical region: Secondary | ICD-10-CM | POA: Diagnosis not present

## 2016-05-24 ENCOUNTER — Other Ambulatory Visit (HOSPITAL_COMMUNITY): Payer: Self-pay | Admitting: Interventional Radiology

## 2016-05-24 DIAGNOSIS — C642 Malignant neoplasm of left kidney, except renal pelvis: Secondary | ICD-10-CM

## 2016-05-25 ENCOUNTER — Telehealth: Payer: Self-pay | Admitting: Radiology

## 2016-05-25 NOTE — Telephone Encounter (Signed)
Phoned [name] to schedule patient's follow up appointments with Dr. Kathlene Cote.    Michael [name] states that patient's dementia is worsening & that he does not want to leave his house.  The family is able to get him to appointments with his primary MD, [name].  These appointments are usually scheduled every 3 months.  The family is requesting cancellation for follow up appointments with Dr Kathlene Cote.    The above has been reviewed with Dr Kathlene Cote.  He is in agreement with family's request as patient is now 5.5 years s/p Cryoablation of Left Renal Papillary Carcinoma.    Barabara Motz [place], RN 05/25/2016 10:12 AM

## 2016-06-09 ENCOUNTER — Ambulatory Visit: Payer: Self-pay | Admitting: Cardiology

## 2016-06-22 DIAGNOSIS — E039 Hypothyroidism, unspecified: Secondary | ICD-10-CM | POA: Diagnosis not present

## 2016-06-22 DIAGNOSIS — N183 Chronic kidney disease, stage 3 (moderate): Secondary | ICD-10-CM | POA: Diagnosis not present

## 2016-06-22 DIAGNOSIS — K219 Gastro-esophageal reflux disease without esophagitis: Secondary | ICD-10-CM | POA: Diagnosis not present

## 2016-06-22 DIAGNOSIS — E782 Mixed hyperlipidemia: Secondary | ICD-10-CM | POA: Diagnosis not present

## 2016-06-22 DIAGNOSIS — E871 Hypo-osmolality and hyponatremia: Secondary | ICD-10-CM | POA: Diagnosis not present

## 2016-06-22 DIAGNOSIS — J441 Chronic obstructive pulmonary disease with (acute) exacerbation: Secondary | ICD-10-CM | POA: Diagnosis not present

## 2016-06-22 DIAGNOSIS — R4189 Other symptoms and signs involving cognitive functions and awareness: Secondary | ICD-10-CM | POA: Diagnosis not present

## 2016-06-22 DIAGNOSIS — I10 Essential (primary) hypertension: Secondary | ICD-10-CM | POA: Diagnosis not present

## 2016-06-22 DIAGNOSIS — F0390 Unspecified dementia without behavioral disturbance: Secondary | ICD-10-CM | POA: Diagnosis not present

## 2016-06-28 DIAGNOSIS — F329 Major depressive disorder, single episode, unspecified: Secondary | ICD-10-CM | POA: Diagnosis not present

## 2016-06-28 DIAGNOSIS — N183 Chronic kidney disease, stage 3 (moderate): Secondary | ICD-10-CM | POA: Diagnosis not present

## 2016-06-28 DIAGNOSIS — E039 Hypothyroidism, unspecified: Secondary | ICD-10-CM | POA: Diagnosis not present

## 2016-06-28 DIAGNOSIS — J439 Emphysema, unspecified: Secondary | ICD-10-CM | POA: Diagnosis not present

## 2016-06-28 DIAGNOSIS — K219 Gastro-esophageal reflux disease without esophagitis: Secondary | ICD-10-CM | POA: Diagnosis not present

## 2016-06-28 DIAGNOSIS — M7501 Adhesive capsulitis of right shoulder: Secondary | ICD-10-CM | POA: Diagnosis not present

## 2016-06-28 DIAGNOSIS — E782 Mixed hyperlipidemia: Secondary | ICD-10-CM | POA: Diagnosis not present

## 2016-06-28 DIAGNOSIS — I10 Essential (primary) hypertension: Secondary | ICD-10-CM | POA: Diagnosis not present

## 2016-07-10 ENCOUNTER — Telehealth: Payer: Self-pay | Admitting: *Deleted

## 2016-07-10 ENCOUNTER — Ambulatory Visit: Payer: Self-pay | Admitting: Cardiology

## 2016-07-10 NOTE — Telephone Encounter (Signed)
Spoke with Maudie Mercury one of the pharmacist for [facility] who suggested that if pt hasn't had any problems with Ranexa and Celexa after taking them both over a year to continue both Celexa and Ranexa  - daughter made aware of this and that we would forward to [name] so that he could make the decision on what anti depressant would work the best for the pt .

## 2016-07-10 NOTE — Telephone Encounter (Signed)
Pt had appt today at 10 - cx due to weather - daughter says pt has been taking Celexa and Ranexa for the last year - pharmacist says that contraindication with taking Celexa and Ranexa (prolonged QT interval) pt went to [name] for this who suggest Effexor - pharmacist says this was also contraindicated with Ranexa - [name] and pharmacist suggested that pt get Dr McDowell's recs on safest anti depressant with Ranexa

## 2016-07-10 NOTE — Telephone Encounter (Signed)
This is really something that Dr. Pleas Koch should take care of. Since the patient's daughter is uncertain of what to do and has not been provided guidance through PCP office, would recommend that we contact the patient's pharmacist and have them run a list of antidepressant medications with Ranexa in terms of potential contraindications for QT prolongation. Frankly, I did not see Effexor on a brief search that I did but would defer to the pharmacy list as it is probably more accurate. When these medications are defined, we can pass this along to Dr. Lizbeth Bark office, and then let him make the appropriate choice for an antidepressant for his patient.

## 2016-07-14 ENCOUNTER — Ambulatory Visit: Payer: Self-pay | Admitting: Cardiology

## 2016-08-14 ENCOUNTER — Ambulatory Visit (INDEPENDENT_AMBULATORY_CARE_PROVIDER_SITE_OTHER): Payer: Medicare Other | Admitting: Cardiology

## 2016-08-14 ENCOUNTER — Encounter: Payer: Self-pay | Admitting: Cardiology

## 2016-08-14 VITALS — BP 106/63 | HR 80 | Ht 67.0 in | Wt 140.2 lb

## 2016-08-14 DIAGNOSIS — E782 Mixed hyperlipidemia: Secondary | ICD-10-CM

## 2016-08-14 DIAGNOSIS — I209 Angina pectoris, unspecified: Secondary | ICD-10-CM | POA: Diagnosis not present

## 2016-08-14 DIAGNOSIS — I10 Essential (primary) hypertension: Secondary | ICD-10-CM | POA: Diagnosis not present

## 2016-08-14 DIAGNOSIS — I25119 Atherosclerotic heart disease of native coronary artery with unspecified angina pectoris: Secondary | ICD-10-CM

## 2016-08-14 DIAGNOSIS — F32A Depression, unspecified: Secondary | ICD-10-CM

## 2016-08-14 DIAGNOSIS — F329 Major depressive disorder, single episode, unspecified: Secondary | ICD-10-CM

## 2016-08-14 NOTE — Patient Instructions (Signed)

## 2016-08-14 NOTE — Progress Notes (Signed)
Cardiology Office Note  Date: 08/14/2016   ID: Michael [name], DOB Jun 01, 1931, MRN [id number]  PCP: Curlene Labrum, MD  Primary Cardiologist: Rozann Lesches, MD   Chief Complaint  Patient presents with  . Coronary Artery Disease    History of Present Illness: Michael [name] is an 83 y.o. male last seen in July 2017. He is here today with his daughter for a follow-up visit. He does not report any angina or increasing shortness of breath, does basic ADLs and otherwise does not push himself. She tells me that his depression symptoms seem to be better since he was switched to Effexor XR by Dr. Pleas Koch.  Current cardiac regimen includes Norvasc, aspirin, Lipitor, and Ranexa.  I personally reviewed his ECG today which shows sinus rhythm with prolonged PR interval, leftward axis and low voltage. QTc is normal.  Past Medical History:  Diagnosis Date  . Allergic rhinitis   . Anemia   . Anxiety   . Arthritis   . Carotid artery occlusion   . COPD (chronic obstructive pulmonary disease) ([place])   . Coronary atherosclerosis of native coronary artery    BMS RCA 2003, DES RCA 2004, DES RCA 2005   . Depression   . Essential hypertension   . GERD (gastroesophageal reflux disease)   . Hemorrhoids   . History of asbestos exposure   . History of herpes zoster 2001  . HOH (hard of hearing)   . Hyperthyroidism 1978   Status post radioactive iodine treatment  . Hypothyroidism   . Lumbar pain   . Mixed hyperlipidemia   . Myocardial infarction ([place])   . Peripheral arterial disease ([place])   . Pneumonia November 2014  . Prostate cancer [facility])    Status post XRT/IMRT 2008 - Dr. Alinda Money  . Psoriasis   . Renal cancer ([place])    Cryoablation in 2012    Past Surgical History:  Procedure Laterality Date  . ANTERIOR CERVICAL DECOMP/DISCECTOMY FUSION  07/2002  . BACK SURGERY  07/2002  . CARDIAC CATHETERIZATION  09/2004, 08/2005, 10/2007; 01/2009; 11/2010; 07/02/2015  . CARDIAC CATHETERIZATION N/A  07/02/2015   Procedure: Left Heart Cath and Coronary Angiography;  Surgeon: Jettie Booze, MD;  Location: [place] [facility];  Service: Cardiovascular;  Laterality: N/A;  . CAROTID ENDARTERECTOMY    . CATARACT EXTRACTION W/ INTRAOCULAR LENS  IMPLANT, BILATERAL Bilateral spring 2010  . COLONOSCOPY  03/2005; 11/2007   "normal; scattered diverticulosis, internal hemorrhoid"  . CORONARY ANGIOPLASTY WITH STENT PLACEMENT  11/2001,11/2002, 01/2004     HAS 3 STENTS DONE AT 3 DIFFERENT CATHS - ONE IS DRUG ELUTING STENT  . ENDARTERECTOMY  08/30/2011   Procedure: Left ENDARTERECTOMY CAROTID;  Surgeon: Conrad Olivet, MD;  Location: [place];  Service: Vascular;  Laterality: Left;  . ENDARTERECTOMY Right 12/18/2012   Procedure: ENDARTERECTOMY CAROTID WITH BOVINE PATCH ANGIOPLASTY;  Surgeon: Conrad Cortland, MD;  Location: [place];  Service: Vascular;  Laterality: Right;  . ESOPHAGOGASTRODUODENOSCOPY  12/2005; 11/2007   "normal; inflammation"  . EYE SURGERY Right 08/2007   vitreous hemorrhage  . JOINT REPLACEMENT     Right partial Knee  . KNEE ARTHROPLASTY     bilateral  . LAPAROSCOPIC CHOLECYSTECTOMY  10/1993  . NASAL SEPTUM SURGERY     SMR  . PARTIAL KNEE ARTHROPLASTY Right 10/2003  . POPLITEAL SYNOVIAL CYST EXCISION    . RENAL CRYOABLATION  10/2010  . TOTAL KNEE ARTHROPLASTY Left 2011  . TOTAL KNEE REVISION Right 08/06/2013  Procedure: REVISION RIGHT TOTAL KNEE ARTHROPLASTY  ;  Surgeon: Gearlean Alf, MD;  Location[facility];  Service: Orthopedics;  Laterality: Right;    Current Outpatient Prescriptions  Medication Sig Dispense Refill  . albuterol (PROAIR HFA) 108 (90 BASE) MCG/ACT inhaler Inhale 2 puffs into the lungs every 6 (six) hours as needed. For shortness of breath    . amLODipine (NORVASC) 5 MG tablet Take 1 tablet (5 mg total) by mouth every morning. 90 tablet 3  . aspirin 81 MG tablet Take 81 mg by mouth every morning.     Marland Kitchen atorvastatin (LIPITOR) 40 MG tablet Take 40 mg by mouth at bedtime.     .  cetirizine (ZYRTEC) 10 MG tablet Take 10 mg by mouth every morning.     . donepezil (ARICEPT) 5 MG tablet Take 5 mg by mouth daily.     . ferrous sulfate 325 (65 FE) MG tablet Take 325 mg by mouth at bedtime.     . Fluticasone-Salmeterol (ADVAIR DISKUS) 250-50 MCG/DOSE AEPB Inhale 1 puff into the lungs every 12 (twelve) hours.      Marland Kitchen levothyroxine (SYNTHROID, LEVOTHROID) 125 MCG tablet Take 125 mcg by mouth every morning.     . mometasone (NASONEX) 50 MCG/ACT nasal spray Place 2 sprays into the nose at bedtime.    . Multiple Vitamin (MULTIVITAMIN) tablet Take 1 tablet by mouth every morning.     . nitroGLYCERIN (NITROSTAT) 0.4 MG SL tablet Place 1 tablet (0.4 mg total) under the tongue every 5 (five) minutes as needed. For chest pain 25 tablet 3  . ranitidine (ZANTAC) 150 MG tablet Take 150 mg by mouth every morning.     . ranolazine (RANEXA) 500 MG 12 hr tablet Take 1 tablet (500 mg total) by mouth 2 (two) times daily. 180 tablet 3  . traZODone (DESYREL) 100 MG tablet Take 100 mg by mouth at bedtime as needed. For sleep    . venlafaxine XR (EFFEXOR-XR) 75 MG 24 hr capsule Take 75 mg by mouth every morning.     No current facility-administered medications for this visit.    Allergies:  Hydrocodone-acetaminophen; Oxycodone; Penicillins; Lyrica [pregabalin]; Morphine and related; Ciprofloxacin; and Iron   Social History: The patient  reports that he has been smoking Cigarettes.  He started smoking about 72 years ago. He has a 33.00 pack-year smoking history. He has never used smokeless tobacco. He reports that he does not drink alcohol or use drugs.   ROS:  Please see the history of present illness. Otherwise, complete review of systems is positive for weight loss with anorexia.  All other systems are reviewed and negative.   Physical Exam: VS:  BP 106/63   Pulse 80   Ht '5\' 7"'$  (1.702 m)   Wt 140 lb 3.2 oz (63.6 kg)   SpO2 97%   BMI 21.96 kg/m , BMI Body mass index is 21.96 kg/m.  Wt  Readings from Last 3 Encounters:  08/14/16 140 lb 3.2 oz (63.6 kg)  03/01/16 156 lb (70.8 kg)  11/22/15 148 lb 9.6 oz (67.4 kg)    General: Elderly male, appears comfortable at rest. HEENT: Conjunctiva and lids normal, oropharynx clear. Neck: Supple, no elevated JVP, bilateral CEA scars, no thyromegaly. Lungs: Diminished breath sounds without wheezing, nonlabored breathing at rest. Cardiac: Regular rate and rhythm, no S3 or significant systolic murmur, no pericardial rub. Abdomen: Soft, nontender, bowel sounds present, no guarding or rebound. Extremities: No pitting edema, distal pulses 2+. Skin: Warm and  dry. Musculoskeletal: No kyphosis Neuropsychiatric: Alert and oriented 3, affect appropriate.  ECG: I personally reviewed the tracing from 03/01/2016 which showed sinus rhythm with PVC, leftward axis, nonspecific ST changes and low voltage.  Recent Labwork: 03/01/2016: ALT 14; AST 18; BUN 14; Creatinine, Ser 1.09; Hemoglobin 11.9; Platelets 254; Potassium 3.6; Sodium 132     Component Value Date/Time   CHOL 122 02/16/2014 0124   TRIG 74 02/16/2014 0124   HDL 67 02/16/2014 0124   CHOLHDL 1.8 02/16/2014 0124   VLDL 15 02/16/2014 0124   LDLCALC 40 02/16/2014 0124    Other Studies Reviewed Today:  Cardiac catheterization 07/02/2015:  Patent stents in the RCA with only mild instent restenosis.  Nononbstructive disease in the LAD and circumflex.  The left ventricular systolic function is normal.  Normal LVEDP.  Assessment and Plan:  1. Symptomatically stable CAD status post previous percutaneous coronary interventions as outlined above. Cardiac catheterization from last year revealed patent stent sites with nonobstructive LAD and circumflex disease. Plan to continue medical therapy. He is tolerating Ranexa well, QTc normal on ECG.  2. Hyperlipidemia, continues on Lipitor. He continues to follow with Dr. Pleas Koch.  3. Essential hypertension, blood pressure is well controlled  today.  4. Depression and dementia. Has generally had a slow decline over time. His daughter states that he seems to be doing better on Effexor XR. He continues to follow with Dr. Pleas Koch.  Current medicines were reviewed with the patient today.   Orders Placed This Encounter  Procedures  . EKG 12-Lead    Disposition: Follow-up in 6 months.  Signed, Satira Sark, MD, [facility] 08/14/2016 4:45 PM    [facility] [place], [place], [place] [address] Phone: [phone]; Fax: [phone]

## 2016-08-20 ENCOUNTER — Other Ambulatory Visit: Payer: Self-pay | Admitting: Cardiology

## 2016-08-29 DIAGNOSIS — E039 Hypothyroidism, unspecified: Secondary | ICD-10-CM | POA: Diagnosis not present

## 2016-08-29 DIAGNOSIS — N183 Chronic kidney disease, stage 3 (moderate): Secondary | ICD-10-CM | POA: Diagnosis not present

## 2016-08-29 DIAGNOSIS — K219 Gastro-esophageal reflux disease without esophagitis: Secondary | ICD-10-CM | POA: Diagnosis not present

## 2016-08-29 DIAGNOSIS — E782 Mixed hyperlipidemia: Secondary | ICD-10-CM | POA: Diagnosis not present

## 2016-08-29 DIAGNOSIS — J441 Chronic obstructive pulmonary disease with (acute) exacerbation: Secondary | ICD-10-CM | POA: Diagnosis not present

## 2016-08-29 DIAGNOSIS — F17210 Nicotine dependence, cigarettes, uncomplicated: Secondary | ICD-10-CM | POA: Diagnosis not present

## 2016-08-29 DIAGNOSIS — I10 Essential (primary) hypertension: Secondary | ICD-10-CM | POA: Diagnosis not present

## 2016-08-31 DIAGNOSIS — E039 Hypothyroidism, unspecified: Secondary | ICD-10-CM | POA: Diagnosis not present

## 2016-08-31 DIAGNOSIS — Z955 Presence of coronary angioplasty implant and graft: Secondary | ICD-10-CM | POA: Diagnosis not present

## 2016-08-31 DIAGNOSIS — F0390 Unspecified dementia without behavioral disturbance: Secondary | ICD-10-CM | POA: Diagnosis not present

## 2016-08-31 DIAGNOSIS — F329 Major depressive disorder, single episode, unspecified: Secondary | ICD-10-CM | POA: Diagnosis not present

## 2016-08-31 DIAGNOSIS — E782 Mixed hyperlipidemia: Secondary | ICD-10-CM | POA: Diagnosis not present

## 2016-08-31 DIAGNOSIS — I2510 Atherosclerotic heart disease of native coronary artery without angina pectoris: Secondary | ICD-10-CM | POA: Diagnosis not present

## 2016-08-31 DIAGNOSIS — I708 Atherosclerosis of other arteries: Secondary | ICD-10-CM | POA: Diagnosis not present

## 2016-08-31 DIAGNOSIS — I10 Essential (primary) hypertension: Secondary | ICD-10-CM | POA: Diagnosis not present

## 2016-11-03 ENCOUNTER — Ambulatory Visit
Admission: RE | Admit: 2016-11-03 | Discharge: 2016-11-03 | Disposition: A | Discharge status: Home / Self Care | Payer: Self-pay | Source: Ambulatory Visit | Attending: Internal Medicine | Admitting: Internal Medicine

## 2016-11-03 ENCOUNTER — Other Ambulatory Visit: Payer: Self-pay | Admitting: Internal Medicine

## 2016-11-03 DIAGNOSIS — J432 Centrilobular emphysema: Secondary | ICD-10-CM

## 2016-12-14 DIAGNOSIS — E871 Hypo-osmolality and hyponatremia: Secondary | ICD-10-CM | POA: Diagnosis not present

## 2016-12-14 DIAGNOSIS — F329 Major depressive disorder, single episode, unspecified: Secondary | ICD-10-CM | POA: Diagnosis not present

## 2016-12-14 DIAGNOSIS — E039 Hypothyroidism, unspecified: Secondary | ICD-10-CM | POA: Diagnosis not present

## 2016-12-14 DIAGNOSIS — N183 Chronic kidney disease, stage 3 (moderate): Secondary | ICD-10-CM | POA: Diagnosis not present

## 2016-12-14 DIAGNOSIS — F17210 Nicotine dependence, cigarettes, uncomplicated: Secondary | ICD-10-CM | POA: Diagnosis not present

## 2016-12-14 DIAGNOSIS — I10 Essential (primary) hypertension: Secondary | ICD-10-CM | POA: Diagnosis not present

## 2016-12-14 DIAGNOSIS — K219 Gastro-esophageal reflux disease without esophagitis: Secondary | ICD-10-CM | POA: Diagnosis not present

## 2016-12-14 DIAGNOSIS — E782 Mixed hyperlipidemia: Secondary | ICD-10-CM | POA: Diagnosis not present

## 2016-12-20 DIAGNOSIS — I10 Essential (primary) hypertension: Secondary | ICD-10-CM | POA: Diagnosis not present

## 2016-12-20 DIAGNOSIS — Z955 Presence of coronary angioplasty implant and graft: Secondary | ICD-10-CM | POA: Diagnosis not present

## 2016-12-20 DIAGNOSIS — I708 Atherosclerosis of other arteries: Secondary | ICD-10-CM | POA: Diagnosis not present

## 2016-12-20 DIAGNOSIS — E782 Mixed hyperlipidemia: Secondary | ICD-10-CM | POA: Diagnosis not present

## 2016-12-20 DIAGNOSIS — Z0001 Encounter for general adult medical examination with abnormal findings: Secondary | ICD-10-CM | POA: Diagnosis not present

## 2016-12-20 DIAGNOSIS — F0390 Unspecified dementia without behavioral disturbance: Secondary | ICD-10-CM | POA: Diagnosis not present

## 2016-12-20 DIAGNOSIS — Z6822 Body mass index (BMI) 22.0-22.9, adult: Secondary | ICD-10-CM | POA: Diagnosis not present

## 2016-12-20 DIAGNOSIS — I2510 Atherosclerotic heart disease of native coronary artery without angina pectoris: Secondary | ICD-10-CM | POA: Diagnosis not present

## 2016-12-27 DIAGNOSIS — J440 Chronic obstructive pulmonary disease with acute lower respiratory infection: Secondary | ICD-10-CM | POA: Diagnosis not present

## 2016-12-27 DIAGNOSIS — N183 Chronic kidney disease, stage 3 (moderate): Secondary | ICD-10-CM | POA: Diagnosis not present

## 2016-12-27 DIAGNOSIS — F17210 Nicotine dependence, cigarettes, uncomplicated: Secondary | ICD-10-CM | POA: Diagnosis not present

## 2016-12-27 DIAGNOSIS — Z6823 Body mass index (BMI) 23.0-23.9, adult: Secondary | ICD-10-CM | POA: Diagnosis not present

## 2016-12-27 DIAGNOSIS — J302 Other seasonal allergic rhinitis: Secondary | ICD-10-CM | POA: Diagnosis not present

## 2016-12-27 DIAGNOSIS — J189 Pneumonia, unspecified organism: Secondary | ICD-10-CM | POA: Diagnosis not present

## 2016-12-27 DIAGNOSIS — I10 Essential (primary) hypertension: Secondary | ICD-10-CM | POA: Diagnosis not present

## 2017-01-07 ENCOUNTER — Emergency Department (HOSPITAL_COMMUNITY): Payer: Medicare Other

## 2017-01-07 ENCOUNTER — Encounter (HOSPITAL_COMMUNITY): Payer: Self-pay | Admitting: Emergency Medicine

## 2017-01-07 ENCOUNTER — Inpatient Hospital Stay (HOSPITAL_COMMUNITY)
Admission: EM | Admit: 2017-01-07 | Discharge: 2017-01-11 | DRG: 190 | Disposition: A | Discharge status: Home Health | Payer: Medicare Other | Attending: Internal Medicine | Admitting: Internal Medicine

## 2017-01-07 DIAGNOSIS — J9601 Acute respiratory failure with hypoxia: Secondary | ICD-10-CM

## 2017-01-07 DIAGNOSIS — Z885 Allergy status to narcotic agent status: Secondary | ICD-10-CM

## 2017-01-07 DIAGNOSIS — F17200 Nicotine dependence, unspecified, uncomplicated: Secondary | ICD-10-CM | POA: Diagnosis not present

## 2017-01-07 DIAGNOSIS — Z880 Allergy status to penicillin: Secondary | ICD-10-CM

## 2017-01-07 DIAGNOSIS — T380X5A Adverse effect of glucocorticoids and synthetic analogues, initial encounter: Secondary | ICD-10-CM | POA: Diagnosis present

## 2017-01-07 DIAGNOSIS — Z515 Encounter for palliative care: Secondary | ICD-10-CM

## 2017-01-07 DIAGNOSIS — J441 Chronic obstructive pulmonary disease with (acute) exacerbation: Secondary | ICD-10-CM | POA: Diagnosis not present

## 2017-01-07 DIAGNOSIS — R739 Hyperglycemia, unspecified: Secondary | ICD-10-CM | POA: Diagnosis present

## 2017-01-07 DIAGNOSIS — Z66 Do not resuscitate: Secondary | ICD-10-CM | POA: Diagnosis present

## 2017-01-07 DIAGNOSIS — E222 Syndrome of inappropriate secretion of antidiuretic hormone: Secondary | ICD-10-CM | POA: Diagnosis present

## 2017-01-07 DIAGNOSIS — E038 Other specified hypothyroidism: Secondary | ICD-10-CM | POA: Diagnosis not present

## 2017-01-07 DIAGNOSIS — Z7951 Long term (current) use of inhaled steroids: Secondary | ICD-10-CM

## 2017-01-07 DIAGNOSIS — Z85528 Personal history of other malignant neoplasm of kidney: Secondary | ICD-10-CM | POA: Diagnosis not present

## 2017-01-07 DIAGNOSIS — Z96653 Presence of artificial knee joint, bilateral: Secondary | ICD-10-CM | POA: Diagnosis present

## 2017-01-07 DIAGNOSIS — I2510 Atherosclerotic heart disease of native coronary artery without angina pectoris: Secondary | ICD-10-CM | POA: Diagnosis present

## 2017-01-07 DIAGNOSIS — N183 Chronic kidney disease, stage 3 unspecified: Secondary | ICD-10-CM

## 2017-01-07 DIAGNOSIS — N179 Acute kidney failure, unspecified: Secondary | ICD-10-CM | POA: Diagnosis not present

## 2017-01-07 DIAGNOSIS — J181 Lobar pneumonia, unspecified organism: Secondary | ICD-10-CM | POA: Diagnosis not present

## 2017-01-07 DIAGNOSIS — Z77090 Contact with and (suspected) exposure to asbestos: Secondary | ICD-10-CM | POA: Diagnosis present

## 2017-01-07 DIAGNOSIS — Z79899 Other long term (current) drug therapy: Secondary | ICD-10-CM

## 2017-01-07 DIAGNOSIS — R3915 Urgency of urination: Secondary | ICD-10-CM | POA: Diagnosis present

## 2017-01-07 DIAGNOSIS — J439 Emphysema, unspecified: Secondary | ICD-10-CM | POA: Diagnosis present

## 2017-01-07 DIAGNOSIS — E782 Mixed hyperlipidemia: Secondary | ICD-10-CM | POA: Diagnosis present

## 2017-01-07 DIAGNOSIS — K921 Melena: Secondary | ICD-10-CM | POA: Diagnosis present

## 2017-01-07 DIAGNOSIS — Z955 Presence of coronary angioplasty implant and graft: Secondary | ICD-10-CM

## 2017-01-07 DIAGNOSIS — E039 Hypothyroidism, unspecified: Secondary | ICD-10-CM | POA: Diagnosis present

## 2017-01-07 DIAGNOSIS — J189 Pneumonia, unspecified organism: Secondary | ICD-10-CM | POA: Diagnosis present

## 2017-01-07 DIAGNOSIS — Z8546 Personal history of malignant neoplasm of prostate: Secondary | ICD-10-CM | POA: Diagnosis not present

## 2017-01-07 DIAGNOSIS — I10 Essential (primary) hypertension: Secondary | ICD-10-CM | POA: Diagnosis present

## 2017-01-07 DIAGNOSIS — Z7189 Other specified counseling: Secondary | ICD-10-CM

## 2017-01-07 DIAGNOSIS — F0390 Unspecified dementia without behavioral disturbance: Secondary | ICD-10-CM | POA: Diagnosis present

## 2017-01-07 DIAGNOSIS — R05 Cough: Secondary | ICD-10-CM | POA: Diagnosis not present

## 2017-01-07 DIAGNOSIS — I739 Peripheral vascular disease, unspecified: Secondary | ICD-10-CM | POA: Diagnosis present

## 2017-01-07 DIAGNOSIS — F17210 Nicotine dependence, cigarettes, uncomplicated: Secondary | ICD-10-CM | POA: Diagnosis present

## 2017-01-07 DIAGNOSIS — H9190 Unspecified hearing loss, unspecified ear: Secondary | ICD-10-CM | POA: Diagnosis present

## 2017-01-07 DIAGNOSIS — E860 Dehydration: Secondary | ICD-10-CM | POA: Diagnosis present

## 2017-01-07 DIAGNOSIS — R0602 Shortness of breath: Secondary | ICD-10-CM | POA: Diagnosis not present

## 2017-01-07 DIAGNOSIS — J440 Chronic obstructive pulmonary disease with acute lower respiratory infection: Secondary | ICD-10-CM | POA: Diagnosis present

## 2017-01-07 DIAGNOSIS — Z7982 Long term (current) use of aspirin: Secondary | ICD-10-CM

## 2017-01-07 DIAGNOSIS — Z888 Allergy status to other drugs, medicaments and biological substances status: Secondary | ICD-10-CM

## 2017-01-07 DIAGNOSIS — I129 Hypertensive chronic kidney disease with stage 1 through stage 4 chronic kidney disease, or unspecified chronic kidney disease: Secondary | ICD-10-CM | POA: Diagnosis present

## 2017-01-07 DIAGNOSIS — Z923 Personal history of irradiation: Secondary | ICD-10-CM

## 2017-01-07 DIAGNOSIS — K219 Gastro-esophageal reflux disease without esophagitis: Secondary | ICD-10-CM | POA: Diagnosis present

## 2017-01-07 HISTORY — DX: Unspecified dementia, unspecified severity, without behavioral disturbance, psychotic disturbance, mood disturbance, and anxiety: F03.90

## 2017-01-07 LAB — COMPREHENSIVE METABOLIC PANEL
ALBUMIN: 3.7 g/dL (ref 3.5–5.0)
ALK PHOS: 74 U/L (ref 38–126)
ALT: 21 U/L (ref 17–63)
ANION GAP: 11 (ref 5–15)
AST: 40 U/L (ref 15–41)
BUN: 21 mg/dL — ABNORMAL HIGH (ref 6–20)
CALCIUM: 9.4 mg/dL (ref 8.9–10.3)
CO2: 26 mmol/L (ref 22–32)
Chloride: 93 mmol/L — ABNORMAL LOW (ref 101–111)
Creatinine, Ser: 1.34 mg/dL — ABNORMAL HIGH (ref 0.61–1.24)
GFR calc Af Amer: 55 mL/min — ABNORMAL LOW (ref >60)
GFR calc non Af Amer: 47 mL/min — ABNORMAL LOW (ref >60)
GLUCOSE: 124 mg/dL — AB (ref 65–99)
Potassium: 3.6 mmol/L (ref 3.5–5.1)
SODIUM: 130 mmol/L — AB (ref 135–145)
Total Bilirubin: 0.9 mg/dL (ref 0.3–1.2)
Total Protein: 6.5 g/dL (ref 6.5–8.1)

## 2017-01-07 LAB — CBC WITH DIFFERENTIAL/PLATELET
BASOS PCT: 0 %
Basophils Absolute: 0 10*3/uL (ref 0.0–0.1)
Eosinophils Absolute: 0 10*3/uL (ref 0.0–0.7)
Eosinophils Relative: 0 %
HEMATOCRIT: 36.8 % — AB (ref 39.0–52.0)
Hemoglobin: 12.9 g/dL — ABNORMAL LOW (ref 13.0–17.0)
Lymphocytes Relative: 8 %
Lymphs Abs: 1.6 10*3/uL (ref 0.7–4.0)
MCH: 33.4 pg (ref 26.0–34.0)
MCHC: 35.1 g/dL (ref 30.0–36.0)
MCV: 95.3 fL (ref 78.0–100.0)
MONO ABS: 0.9 10*3/uL (ref 0.1–1.0)
MONOS PCT: 5 %
NEUTROS ABS: 18.1 10*3/uL — AB (ref 1.7–7.7)
Neutrophils Relative %: 88 %
Platelets: 335 10*3/uL (ref 150–400)
RBC: 3.86 MIL/uL — ABNORMAL LOW (ref 4.22–5.81)
RDW: 14 % (ref 11.5–15.5)
WBC: 20.6 10*3/uL — ABNORMAL HIGH (ref 4.0–10.5)

## 2017-01-07 LAB — TROPONIN I: Troponin I: <0.03 ng/mL (ref <0.03)

## 2017-01-07 LAB — PROCALCITONIN: Procalcitonin: <0.1 ng/mL

## 2017-01-07 LAB — TSH: TSH: 1.135 u[IU]/mL (ref 0.350–4.500)

## 2017-01-07 MED ORDER — DEXTROSE 5 % IV SOLN
1.0000 g | INTRAVENOUS | Status: DC
  Administered 2017-01-07 – 2017-01-10 (×4): 1 g via INTRAVENOUS
  Filled 2017-01-07 (×6): qty 10

## 2017-01-07 MED ORDER — SODIUM CHLORIDE 0.9 % IV BOLUS (SEPSIS)
500.0000 mL | Freq: Once | INTRAVENOUS | Status: AC
  Administered 2017-01-07: 500 mL via INTRAVENOUS

## 2017-01-07 MED ORDER — FLUTICASONE PROPIONATE 50 MCG/ACT NA SUSP
2.0000 | Freq: Every day | NASAL | Status: DC
  Administered 2017-01-07 – 2017-01-10 (×4): 2 via NASAL
  Filled 2017-01-07: qty 16

## 2017-01-07 MED ORDER — METHYLPREDNISOLONE SODIUM SUCC 125 MG IJ SOLR
60.0000 mg | Freq: Two times a day (BID) | INTRAMUSCULAR | Status: DC
  Administered 2017-01-07 – 2017-01-09 (×5): 60 mg via INTRAVENOUS
  Filled 2017-01-07 (×5): qty 2

## 2017-01-07 MED ORDER — ENSURE ENLIVE PO LIQD
237.0000 mL | Freq: Two times a day (BID) | ORAL | Status: DC
  Administered 2017-01-08 – 2017-01-11 (×4): 237 mL via ORAL

## 2017-01-07 MED ORDER — ASPIRIN EC 81 MG PO TBEC
81.0000 mg | DELAYED_RELEASE_TABLET | Freq: Every morning | ORAL | Status: DC
  Administered 2017-01-08 – 2017-01-11 (×4): 81 mg via ORAL
  Filled 2017-01-07 (×6): qty 1

## 2017-01-07 MED ORDER — NICOTINE 14 MG/24HR TD PT24
14.0000 mg | MEDICATED_PATCH | Freq: Every day | TRANSDERMAL | Status: DC
  Administered 2017-01-08 – 2017-01-11 (×4): 14 mg via TRANSDERMAL
  Filled 2017-01-07 (×5): qty 1

## 2017-01-07 MED ORDER — ALBUTEROL SULFATE (2.5 MG/3ML) 0.083% IN NEBU
5.0000 mg | INHALATION_SOLUTION | Freq: Once | RESPIRATORY_TRACT | Status: AC
  Administered 2017-01-07: 5 mg via RESPIRATORY_TRACT
  Filled 2017-01-07: qty 6

## 2017-01-07 MED ORDER — METHYLPREDNISOLONE SODIUM SUCC 125 MG IJ SOLR
125.0000 mg | Freq: Once | INTRAMUSCULAR | Status: AC
  Administered 2017-01-07: 125 mg via INTRAVENOUS
  Filled 2017-01-07: qty 2

## 2017-01-07 MED ORDER — ENOXAPARIN SODIUM 40 MG/0.4ML ~~LOC~~ SOLN
40.0000 mg | SUBCUTANEOUS | Status: DC
  Administered 2017-01-07 – 2017-01-10 (×4): 40 mg via SUBCUTANEOUS
  Filled 2017-01-07 (×4): qty 0.4

## 2017-01-07 MED ORDER — RANOLAZINE ER 500 MG PO TB12
500.0000 mg | ORAL_TABLET | Freq: Two times a day (BID) | ORAL | Status: DC
  Administered 2017-01-07 – 2017-01-11 (×8): 500 mg via ORAL
  Filled 2017-01-07 (×8): qty 1

## 2017-01-07 MED ORDER — DOXYCYCLINE HYCLATE 100 MG PO TABS
100.0000 mg | ORAL_TABLET | Freq: Two times a day (BID) | ORAL | Status: DC
  Administered 2017-01-07 – 2017-01-11 (×8): 100 mg via ORAL
  Filled 2017-01-07 (×8): qty 1

## 2017-01-07 MED ORDER — VENLAFAXINE HCL ER 75 MG PO CP24
75.0000 mg | ORAL_CAPSULE | ORAL | Status: DC
  Administered 2017-01-08 – 2017-01-11 (×4): 75 mg via ORAL
  Filled 2017-01-07 (×4): qty 1

## 2017-01-07 MED ORDER — LEVOTHYROXINE SODIUM 25 MCG PO TABS
125.0000 ug | ORAL_TABLET | Freq: Every morning | ORAL | Status: DC
  Administered 2017-01-08 – 2017-01-11 (×4): 125 ug via ORAL
  Filled 2017-01-07 (×4): qty 1

## 2017-01-07 MED ORDER — ATORVASTATIN CALCIUM 40 MG PO TABS
40.0000 mg | ORAL_TABLET | Freq: Every day | ORAL | Status: DC
  Administered 2017-01-07 – 2017-01-10 (×4): 40 mg via ORAL
  Filled 2017-01-07 (×4): qty 1

## 2017-01-07 MED ORDER — AMLODIPINE BESYLATE 5 MG PO TABS
5.0000 mg | ORAL_TABLET | Freq: Every morning | ORAL | Status: DC
  Administered 2017-01-08 – 2017-01-11 (×4): 5 mg via ORAL
  Filled 2017-01-07 (×4): qty 1

## 2017-01-07 MED ORDER — FAMOTIDINE 20 MG PO TABS
20.0000 mg | ORAL_TABLET | Freq: Every day | ORAL | Status: DC
  Administered 2017-01-07 – 2017-01-11 (×5): 20 mg via ORAL
  Filled 2017-01-07 (×5): qty 1

## 2017-01-07 MED ORDER — ALBUTEROL SULFATE (2.5 MG/3ML) 0.083% IN NEBU
2.5000 mg | INHALATION_SOLUTION | RESPIRATORY_TRACT | Status: DC | PRN
Start: 2017-01-07 — End: 2017-01-11
  Administered 2017-01-08 (×2): 2.5 mg via RESPIRATORY_TRACT
  Filled 2017-01-07 (×2): qty 3

## 2017-01-07 MED ORDER — IPRATROPIUM-ALBUTEROL 0.5-2.5 (3) MG/3ML IN SOLN
3.0000 mL | Freq: Four times a day (QID) | RESPIRATORY_TRACT | Status: DC
  Administered 2017-01-07 – 2017-01-09 (×5): 3 mL via RESPIRATORY_TRACT
  Filled 2017-01-07 (×8): qty 3

## 2017-01-07 MED ORDER — TRAZODONE HCL 50 MG PO TABS: 100.0000 mg | ORAL_TABLET | Freq: Every evening | ORAL | Status: DC | PRN

## 2017-01-07 MED ORDER — DONEPEZIL HCL 5 MG PO TABS
5.0000 mg | ORAL_TABLET | Freq: Every day | ORAL | Status: DC
  Administered 2017-01-08 – 2017-01-11 (×4): 5 mg via ORAL
  Filled 2017-01-07 (×4): qty 1

## 2017-01-07 MED ORDER — IPRATROPIUM BROMIDE 0.02 % IN SOLN
0.5000 mg | Freq: Once | RESPIRATORY_TRACT | Status: AC
  Administered 2017-01-07: 0.5 mg via RESPIRATORY_TRACT
  Filled 2017-01-07: qty 2.5

## 2017-01-07 MED ORDER — MOMETASONE FURO-FORMOTEROL FUM 200-5 MCG/ACT IN AERO
2.0000 | INHALATION_SPRAY | Freq: Two times a day (BID) | RESPIRATORY_TRACT | Status: DC
  Administered 2017-01-07 – 2017-01-11 (×8): 2 via RESPIRATORY_TRACT
  Filled 2017-01-07 (×2): qty 8.8

## 2017-01-07 MED ORDER — LORATADINE 10 MG PO TABS
10.0000 mg | ORAL_TABLET | Freq: Every day | ORAL | Status: DC
  Administered 2017-01-08 – 2017-01-11 (×4): 10 mg via ORAL
  Filled 2017-01-07 (×4): qty 1

## 2017-01-07 MED ORDER — SODIUM CHLORIDE 0.9 % IV SOLN
INTRAVENOUS | Status: DC
  Administered 2017-01-07 – 2017-01-09 (×4): via INTRAVENOUS

## 2017-01-07 NOTE — H&P (Signed)
History and Physical    Michael [name] CBJ:[id number] DOB: 1932-03-31 DOA: 01/07/2017  PCP: Curlene Labrum, MD Consultants:  Domenic Polite - cardiology; [facility] Patient coming from:  Home - lives alone - has a Actuary in the mornings and his daughters help to get him dinner nightly; NOK: Daughter, [phone], [phone]  Chief Complaint: weakness  HPI: Michael [name] is a 83 y.o. male with medical history significant of renal and prostate cancers; hypothyroidism; HTN; HLD; GERD; dementia; COPD; and CAD presenting with progressive weakness.  On 8/29, he went to the doctor because he was gettting weaker, not feeling well.  Diagnosed with PNA after CXR and placed on Levaquin x 5 days.  Completed abx and when the doctor called back to check on him on 9/4, he was still not feeling well, weak, decreased PO, no energy.  Was given a prednisone dose pack and he just finished that today.  He had no response to that medication.  Continuing to get weaker, more unsteady, no appetite.  This AM, his daughter came to check on him about 9 and he was on the floor in the bathroom.  He said he didn't fall but felt weak and sat down.  They got him back into the bed.  He was wheezing loudly, which he has been doing for the last several days.  +hot - ?fevers.  Some cough, occasionally productive of yellowish sputum.  He has a lot of urinary urgency with incontinence at baseline.  Very sedentary, has back pain that he complains is his kidneys but daughters think it might be related to minimal activity.       ED Course: COPD-Exac/CAP - given solumedrol and albuterol with improved wheezing with sats 90% at rest.  Given Rocephin and Azithromycin.  AKI - treated with 500 cc bolus.  Continues to smoke.  Review of Systems: As per HPI; otherwise review of systems reviewed and negative.   Ambulatory Status:  Ambulates without assistance - "furniture walking", holding onto things to get around   Past Medical History:    Diagnosis Date  . Allergic rhinitis   . Anemia   . Anxiety   . Arthritis   . Carotid artery occlusion   . COPD (chronic obstructive pulmonary disease) ([place])   . Coronary atherosclerosis of native coronary artery    BMS RCA 2003, DES RCA 2004, DES RCA 2005; last cath 2017   . Dementia   . Depression   . Essential hypertension   . GERD (gastroesophageal reflux disease)   . Hemorrhoids   . History of asbestos exposure   . History of herpes zoster 2001  . HOH (hard of hearing)   . Hyperthyroidism 1978   Status post radioactive iodine treatment  . Hypothyroidism   . Lumbar pain   . Mixed hyperlipidemia   . Peripheral arterial disease ([place])   . Pneumonia November 2014  . Prostate cancer [facility])    Status post XRT/IMRT 2008 - Dr. Alinda Money  . Psoriasis   . Renal cancer ([place])    Cryoablation in 2012    Past Surgical History:  Procedure Laterality Date  . ANTERIOR CERVICAL DECOMP/DISCECTOMY FUSION  07/2002  . BACK SURGERY  07/2002  . CARDIAC CATHETERIZATION  09/2004, 08/2005, 10/2007; 01/2009; 11/2010; 07/02/2015  . CARDIAC CATHETERIZATION N/A 07/02/2015   Procedure: Left Heart Cath and Coronary Angiography;  Surgeon: Jettie Booze, MD;  Location: [place] [facility];  Service: Cardiovascular;  Laterality: N/A;  . CAROTID ENDARTERECTOMY Bilateral 08/2011; 11/2012  Dr. [name]  . CATARACT EXTRACTION W/ INTRAOCULAR LENS  IMPLANT, BILATERAL Bilateral spring 2010  . COLONOSCOPY  03/2005; 11/2007   "normal; scattered diverticulosis, internal hemorrhoid"  . CORONARY ANGIOPLASTY WITH STENT PLACEMENT  11/2001,11/2002, 01/2004     HAS 3 STENTS DONE AT 3 DIFFERENT CATHS - ONE IS DRUG ELUTING STENT  . ENDARTERECTOMY  08/30/2011   Procedure: Left ENDARTERECTOMY CAROTID;  Surgeon: Conrad Ferry Pass, MD;  Location: [place] [facility];  Service: Vascular;  Laterality: Left;  . ENDARTERECTOMY Right 12/18/2012   Procedure: ENDARTERECTOMY CAROTID WITH BOVINE PATCH ANGIOPLASTY;  Surgeon: Conrad Sanders, MD;  Location: [facility];   Service: Vascular;  Laterality: Right;  . ESOPHAGOGASTRODUODENOSCOPY  12/2005; 11/2007   "normal; inflammation"  . EYE SURGERY Right 08/2007   vitreous hemorrhage  . JOINT REPLACEMENT     Right partial Knee  . KNEE ARTHROPLASTY     bilateral  . LAPAROSCOPIC CHOLECYSTECTOMY  10/1993  . NASAL SEPTUM SURGERY     SMR  . PARTIAL KNEE ARTHROPLASTY Right 10/2003  . POPLITEAL SYNOVIAL CYST EXCISION    . RENAL CRYOABLATION  10/2010  . TOTAL KNEE ARTHROPLASTY Left 2011  . TOTAL KNEE REVISION Right 08/06/2013   Procedure: REVISION RIGHT TOTAL KNEE ARTHROPLASTY  ;  Surgeon: Gearlean Alf, MD;  [facility];  Service: Orthopedics;  Laterality: Right;    Social History   Social History  . Marital status: Widowed    Spouse name: N/A  . Number of children: N/A  . Years of education: N/A   Occupational History  . Retired      [employer]   Social History Main Topics  . Smoking status: Current Every Day Smoker    Packs/day: 0.50    Years: 66.00    Types: Cigarettes    Start date: 10/20/1943  . Smokeless tobacco: Never Used     Comment: 1 pack every 3-4 days   . Alcohol use No  . Drug use: No  . Sexual activity: Not Currently   Other Topics Concern  . Not on file   Social History Narrative  . No narrative on file    Allergies  Allergen Reactions  . Hydrocodone-Acetaminophen Rash and Other (See Comments)    Confusion  . Oxycodone Hives  . Penicillins Hives    Has patient had a PCN reaction causing immediate rash, facial/tongue/throat swelling, SOB or lightheadedness with hypotension:  no Has patient had a PCN reaction causing severe rash involving mucus membranes or skin necrosis: yes hives Has patient had a PCN reaction that required hospitalization no Has patient had a PCN reaction occurring within the last 10 years: Yes If all of the above answers are "NO", then may proceed with Cephalo  . Lyrica [Pregabalin] Other (See Comments)  . Morphine And Related Other  (See Comments)  . Ciprofloxacin Nausea Only  . Iron Itching    Family History  Problem Relation Age of Onset  . Coronary artery disease Brother   . Cancer Brother   . Heart disease Brother   . Heart attack Brother   . Anesthesia problems Neg Hx     Prior to Admission medications   Medication Sig Start Date End Date Taking? Authorizing Provider  albuterol (PROAIR HFA) 108 (90 BASE) MCG/ACT inhaler Inhale 2 puffs into the lungs every 6 (six) hours as needed. For shortness of breath   Yes [provider]  amLODipine (NORVASC) 5 MG tablet TAKE 1 TABLET EVERY MORNING 08/21/16  Yes Satira Sark, MD  aspirin 81 MG tablet Take 81 mg by mouth every morning.    Yes [provider]  atorvastatin (LIPITOR) 40 MG tablet Take 40 mg by mouth at bedtime.    Yes [provider]  cetirizine (ZYRTEC) 10 MG tablet Take 10 mg by mouth every morning.    Yes [provider]  donepezil (ARICEPT) 5 MG tablet Take 5 mg by mouth daily.    Yes [provider]  ferrous sulfate 325 (65 FE) MG tablet Take 325 mg by mouth at bedtime.    Yes [provider]  Fluticasone-Salmeterol (ADVAIR DISKUS) 250-50 MCG/DOSE AEPB Inhale 1 puff into the lungs every 12 (twelve) hours.     Yes [provider]  levothyroxine (SYNTHROID, LEVOTHROID) 125 MCG tablet Take 125 mcg by mouth every morning.    Yes [provider]  methylPREDNISolone (MEDROL DOSEPAK) 4 MG TBPK tablet Take 4 mg by mouth. Taper dose. 01/02/17  Yes [provider]  mometasone (NASONEX) 50 MCG/ACT nasal spray Place 2 sprays into the nose at bedtime. 08/26/15  Yes Satira Sark, MD  Multiple Vitamin (MULTIVITAMIN) tablet Take 1 tablet by mouth every morning.    Yes [provider]  nitroGLYCERIN (NITROSTAT) 0.4 MG SL tablet Place 1 tablet (0.4 mg total) under the tongue every 5 (five) minutes as needed. For chest pain 08/27/15  Yes Satira Sark, MD  ranitidine  (ZANTAC) 150 MG tablet Take 150 mg by mouth every morning.    Yes [provider]  ranolazine (RANEXA) 500 MG 12 hr tablet Take 1 tablet (500 mg total) by mouth 2 (two) times daily. 08/20/15  Yes Satira Sark, MD  traZODone (DESYREL) 100 MG tablet Take 100 mg by mouth at bedtime as needed. For sleep   Yes [provider]  venlafaxine XR (EFFEXOR-XR) 75 MG 24 hr capsule Take 75 mg by mouth every morning.   Yes [provider]    Physical Exam: Vitals:   01/07/17 1409 01/07/17 1531 01/07/17 1600 01/07/17 1632  BP:  131/66 120/61   Pulse:  86 82   Resp:  16 18   Temp:      TempSrc:      SpO2: 94% 93% 96% 95%  Weight:      Height:         General:  Appears calm and comfortable and is NAD; somnolent throughout visit Eyes:  PERRL, EOMI, normal lids, iris ENT:  grossly normal hearing, lips & tongue, mmm Neck:  no LAD, masses or thyromegaly; no carotid bruits Cardiovascular:  RRR, no m/r/g. No LE edema.  Respiratory:   CTA bilaterally with scant wheezes and no rales/rhonchi.  Normal respiratory effort. Abdomen:  soft, NT, ND, NABS Back:   normal alignment, no CVAT Skin:  no rash or induration seen on limited exam Musculoskeletal:  grossly normal tone BUE/BLE, good ROM, no bony abnormality Psychiatric:  grossly normal mood and affect, speech fluent and appropriate but somewhat sparse - deferred to his daughter to answer all questions but he did comment on all the "good-looking young doctors" in the ER Neurologic:  CN 2-12 grossly intact, moves all extremities in coordinated fashion, sensation intact    Radiological Exams on Admission: Dg Chest Port 1 View  Result Date: 01/07/2017 CLINICAL DATA:  Patient with cough.  COPD. EXAM: PORTABLE CHEST 1 VIEW COMPARISON:  Chest radiograph 12/27/2016 FINDINGS: Monitoring leads overlie the patient. Patient is mildly rotated. Stable enlarged cardiac and mediastinal contours. Persistent peripheral consolidative opacity  right mid lung. Bibasilar atelectasis. No pleural effusion or pneumothorax. IMPRESSION: Persistent peripheral opacity within the right mid lung which may represent pneumonia in the appropriate clinical setting. Followup PA and lateral chest X-ray is recommended in 3-4 weeks following trial of antibiotic therapy to ensure resolution and exclude underlying malignancy. Electronically Signed   By: Lovey Newcomer M.D.   On: 01/07/2017 14:35    EKG: Independently reviewed.  NSR with rate 75; nonspecific ST changes with no evidence of acute ischemia   Labs on Admission: I have personally reviewed the available labs and imaging studies at the time of the admission.  Pertinent labs:   Na++ 130 Glucose 124 BUN 21/Creatinine 1.34/GFR 47; prior 14/1.09/>60 in 11/17 Troponin <0.03 WBC 20.6  Assessment/Plan Principal Problem:   Pneumonia Active Problems:   TOBACCO USER   COPD with emphysema (HCC)   Essential hypertension   Other specified hypothyroidism   Dementia   AKI (acute kidney injury) ([place])   Hyperglycemia   Pneumonia, likely with concomitant COPD exacerbation -Given productive cough and infiltrate in right middle lobe on chest x-ray, this is concerning for ongoing community-acquired PNA. -Other etiologies include aspiration versus URI (most likely viral). -He has already received a course of Levaquin without improvement - indicating that this is either resistant PNA or that there is an alternate diagnosis. -CURB-65 score is 2 - will admit the patient to Med Surg. -Pneumonia Severity Index (PSI) is Class 3, 1% mortality. -Corticosteroids have been to shown to low overall mortality rate; risk of ARDS; and need for mechanical ventilation.  This is particularly true in severe PNA (class 3+ PSI).  He has already received a course of steroids PO but will add Solumedrol now given COPD component. -Will start Doxycyline 100 mg BID AND Rocephin due to no risk factors for MDR cause. -Additional  complicating factors include:  associated dehydration; failure of outpatient antibiotics; adverse effects from medications; and underlying dementia.   -There is also likely a component of Acute on chronic respiratory failure associated with a COPD exacerbation -He was heavily wheezing on presentation to the ER. -He was given a neb treatment in the ED with marked improvement. -The patient's daughter reports concern that the patient does not understand how to use his COPD therapies and requests family education so that they can better support him. -There is also a possibility that there is an underlying malignancy given his "persistent peripheral opacity" on CXR and ongoing smoking.  We discussed CT scanning for further information, but the patient is not certain that the patient/family would pursue treatment any differently.  There are interested in palliative care consultation to discuss goals of therapy in a family meeting.  We left the option of CT scanning open for now - either during this hospitalization; after subsequent f/u CXR in 4-6 weeks if still abnormal; or not at all - based on patient/family preference. -will admit patient - with his failure of outpatient therapy, it seems likely that he will need several days of hospitalization to show sufficient improvement for discharge. -Nebulizers: scheduled Duoneb and prn albuterol -Solu-Medrol 60 mg IV BID  -NS @ 75cc/hr -Fever control -Repeat CBC in am -Sputum cultures -Blood cultures -Strep pneumo testing -Will order procalcitonin level.  >0.5 indicates infection and >>0.5 indicates more serious disease.  As the procalcitonin level normalizes, it will be reasonable to consider de-escalation of antibiotic coverage.  AKI -Likely due to prerenal secondary to dehydration in the setting of poor PO intake and active infection -IVF -  Follow up renal function by BMP -Avoid ACEI and NSAIDs  HTN -Continue Norvasc  Hypothyroidism -Check  TSH -Continue Synthroid at current dose for now  Dementia -Continue Aricept -Family counseled about how patients with dementia often do better behaviorally while hospitalized if family members are present overnight  Hyperglycemia -May be stress response -Will follow with fasting AM labs -It is unlikely that he will need acute or chronic treatment for this issue  Tobacco dependence -Encourage cessation.  This was discussed with the daughter and should be reviewed on an ongoing basis.   -Patch ordered.   DVT prophylaxis: Lovenox  Code Status: DNR - confirmed with patient/family Family Communication: Daughter present throughout evaluation  Disposition Plan:  Home once clinically improved Consults called: Palliative care; CM/SW/Nutrition/RT/PT/OT Admission status: Admit - It is my clinical opinion that admission to INPATIENT is reasonable and necessary because this patient will require at least 2 midnights in the hospital to treat this condition based on the medical complexity of the problems presented.  Given the aforementioned information, the predictability of an adverse outcome is felt to be significant.    Karmen Bongo MD [facility]  If note is complete, please contact covering daytime or nighttime physician. [email or web address] Password [facility]  01/07/2017, 6:17 PM

## 2017-01-07 NOTE — ED Notes (Signed)
Per dr ray. Put pt on 2L of O2 nasal cannula. O2 was 90% on room air. Per dr. Jeanell Sparrow do not ambulate pt.

## 2017-01-07 NOTE — ED Provider Notes (Signed)
[facility] DEPT Provider Note   CSN: [id number] Arrival date & time: 01/07/17  1336     History   Chief Complaint Chief Complaint  Patient presents with  . Cough    HPI Michael [name] is a 83 y.o. male.  HPI 83 year old male with a history of COPD, coronary artery disease, hypertension, GERD, pneumonia, peripheral arterial disease presents today with cough, congestion, dyspnea, and found on floor this morning. Daughter states that patient lives alone. She last saw him and 9 PM last night and found himon the floor this morning. He told them that he sat down because she got weak. Daughter states that he has been sick for the past 2 weeks. He began having some cough, subjective fever, and dyspnea. He was seen by his primary care physician on August 29 and placed on a five-day course of Levaquin. He was seen and followed up but continued to have cough and dyspnea. He was placed on prednisone and is on the fifth day of that without relief. The daughter states that she does not think he has been eating or drinking as well as usual. He has had decreased activity. She isn't daily and they have aides that come and. However, he is alone at night. Is unclear what happened last night. He did complain of some chest pain on arrival here. He denies any other pain. Past Medical History:  Diagnosis Date  . Allergic rhinitis   . Anemia   . Anxiety   . Arthritis   . Carotid artery occlusion   . COPD (chronic obstructive pulmonary disease) ([place])   . Coronary atherosclerosis of native coronary artery    BMS RCA 2003, DES RCA 2004, DES RCA 2005   . Depression   . Essential hypertension   . GERD (gastroesophageal reflux disease)   . Hemorrhoids   . History of asbestos exposure   . History of herpes zoster 2001  . HOH (hard of hearing)   . Hyperthyroidism 1978   Status post radioactive iodine treatment  . Hypothyroidism   . Lumbar pain   . Mixed hyperlipidemia   . Myocardial infarction ([place])     . Peripheral arterial disease ([place])   . Pneumonia November 2014  . Prostate cancer [facility])    Status post XRT/IMRT 2008 - Dr. Alinda Money  . Psoriasis   . Renal cancer [facility])    Cryoablation in 2012    Patient Active Problem List   Diagnosis Date Noted  . Unstable angina ([place])   . Adrenal gland cyst ([place])   . Primary renal papillary carcinoma ([place])   . Acute chest pain 09/06/2014  . Chest pain at rest   . Essential hypertension   . Dyslipidemia   . Other specified hypothyroidism   . Carotid stenosis 02/20/2014  . Chest pain 02/15/2014  . Postoperative anemia due to acute blood loss 08/07/2013  . Pain due to unicompartmental arthroplasty of knee ([place]) 08/06/2013  . Preoperative cardiovascular examination 06/18/2013  . Carotid stenosis, bilateral 08/04/2011  . Carotid artery obstruction 02/03/2011  . TOBACCO USER 10/08/2009  . Clinical depression 02/25/2009  . CAD, NATIVE VESSEL 12/22/2008  . PVD 12/22/2008  . Peripheral blood vessel disorder ([place]) 12/22/2008  . COPD with emphysema ([place]) 11/29/2007  . History of asbestos exposure 11/29/2007    Past Surgical History:  Procedure Laterality Date  . ANTERIOR CERVICAL DECOMP/DISCECTOMY FUSION  07/2002  . BACK SURGERY  07/2002  . CARDIAC CATHETERIZATION  09/2004, 08/2005, 10/2007; 01/2009; 11/2010; 07/02/2015  .  CARDIAC CATHETERIZATION N/A 07/02/2015   Procedure: Left Heart Cath and Coronary Angiography;  Surgeon: Jettie Booze, MD;  Location: [facility] CV LAB;  Service: Cardiovascular;  Laterality: N/A;  . CAROTID ENDARTERECTOMY    . CATARACT EXTRACTION W/ INTRAOCULAR LENS  IMPLANT, BILATERAL Bilateral spring 2010  . COLONOSCOPY  03/2005; 11/2007   "normal; scattered diverticulosis, internal hemorrhoid"  . CORONARY ANGIOPLASTY WITH STENT PLACEMENT  11/2001,11/2002, 01/2004     HAS 3 STENTS DONE AT 3 DIFFERENT CATHS - ONE IS DRUG ELUTING STENT  . ENDARTERECTOMY  08/30/2011   Procedure: Left ENDARTERECTOMY CAROTID;  Surgeon: Conrad [place],  MD;  Location: [facility];  Service: Vascular;  Laterality: Left;  . ENDARTERECTOMY Right 12/18/2012   Procedure: ENDARTERECTOMY CAROTID WITH BOVINE PATCH ANGIOPLASTY;  Surgeon: Conrad [place], MD;  Location: [facility];  Service: Vascular;  Laterality: Right;  . ESOPHAGOGASTRODUODENOSCOPY  12/2005; 11/2007   "normal; inflammation"  . EYE SURGERY Right 08/2007   vitreous hemorrhage  . JOINT REPLACEMENT     Right partial Knee  . KNEE ARTHROPLASTY     bilateral  . LAPAROSCOPIC CHOLECYSTECTOMY  10/1993  . NASAL SEPTUM SURGERY     SMR  . PARTIAL KNEE ARTHROPLASTY Right 10/2003  . POPLITEAL SYNOVIAL CYST EXCISION    . RENAL CRYOABLATION  10/2010  . TOTAL KNEE ARTHROPLASTY Left 2011  . TOTAL KNEE REVISION Right 08/06/2013   Procedure: REVISION RIGHT TOTAL KNEE ARTHROPLASTY  ;  Surgeon: Gearlean Alf, MD;  Location: [facility];  Service: Orthopedics;  Laterality: Right;       Home Medications    Prior to Admission medications   Medication Sig Start Date End Date Taking? Authorizing Provider  albuterol (PROAIR HFA) 108 (90 BASE) MCG/ACT inhaler Inhale 2 puffs into the lungs every 6 (six) hours as needed. For shortness of breath    [provider]  amLODipine (NORVASC) 5 MG tablet TAKE 1 TABLET EVERY MORNING 08/21/16   Satira Sark, MD  aspirin 81 MG tablet Take 81 mg by mouth every morning.     [provider]  atorvastatin (LIPITOR) 40 MG tablet Take 40 mg by mouth at bedtime.     [provider]  cetirizine (ZYRTEC) 10 MG tablet Take 10 mg by mouth every morning.     [provider]  donepezil (ARICEPT) 5 MG tablet Take 5 mg by mouth daily.     [provider]  ferrous sulfate 325 (65 FE) MG tablet Take 325 mg by mouth at bedtime.     [provider]  Fluticasone-Salmeterol (ADVAIR DISKUS) 250-50 MCG/DOSE AEPB Inhale 1 puff into the lungs every 12 (twelve) hours.      [provider]  levothyroxine (SYNTHROID, LEVOTHROID) 125 MCG tablet Take  125 mcg by mouth every morning.     [provider]  mometasone (NASONEX) 50 MCG/ACT nasal spray Place 2 sprays into the nose at bedtime. 08/26/15   Satira Sark, MD  Multiple Vitamin (MULTIVITAMIN) tablet Take 1 tablet by mouth every morning.     [provider]  nitroGLYCERIN (NITROSTAT) 0.4 MG SL tablet Place 1 tablet (0.4 mg total) under the tongue every 5 (five) minutes as needed. For chest pain 08/27/15   Satira Sark, MD  RANEXA 500 MG 12 hr tablet TAKE 1 TABLET TWICE A DAY 08/21/16   Satira Sark, MD  ranitidine (ZANTAC) 150 MG tablet Take 150 mg by mouth every morning.     [provider]  ranolazine (RANEXA) 500 MG 12 hr tablet Take 1 tablet (500 mg total) by mouth 2 (two) times daily. 08/20/15   Satira Sark, MD  traZODone (DESYREL) 100 MG tablet Take 100 mg by mouth at bedtime as needed. For sleep    [provider]  venlafaxine XR (EFFEXOR-XR) 75 MG 24 hr capsule Take 75 mg by mouth every morning.    [provider]    Family History Family History  Problem Relation Age of Onset  . Coronary artery disease Brother   . Cancer Brother   . Heart disease Brother   . Heart attack Brother   . Anesthesia problems Neg Hx     Social History Social History  Substance Use Topics  . Smoking status: Current Every Day Smoker    Packs/day: 0.50    Years: 66.00    Types: Cigarettes    Start date: 10/20/1943  . Smokeless tobacco: Never Used     Comment: 1 pack every 3-4 days   . Alcohol use No     Allergies   Hydrocodone-acetaminophen; Oxycodone; Penicillins; Lyrica [pregabalin]; Morphine and related; Ciprofloxacin; and Iron   Review of Systems Review of Systems  All other systems reviewed and are negative.    Physical Exam Updated Vital Signs BP (!) 157/72 (BP Location: Left Arm)   Pulse 79   Temp 99.2 F (37.3 C) (Rectal)   Resp 20   Ht 1.727 m (5\' 8" )   Wt 63.5 kg (140 lb)   SpO2 94%   BMI 21.29 kg/m    Physical Exam  Constitutional: He appears well-developed and well-nourished. No distress.  HENT:  Head: Normocephalic.  Right Ear: External ear normal.  Left Ear: External ear normal.  Mucous membranes appear dry  Eyes: Pupils are equal, round, and reactive to light. EOM are normal.  Neck: Normal range of motion. Neck supple.  Cardiovascular: Normal rate and regular rhythm.   Pulmonary/Chest: Effort normal. He has wheezes.  Diffuse rhonchi and expiratory wheezes  Abdominal: Soft. Bowel sounds are normal.  Musculoskeletal: Normal range of motion. He exhibits no edema.  Neurological: He is alert.  Skin: Skin is warm. Capillary refill takes less than 2 seconds.  Psychiatric: He has a normal mood and affect.  Nursing note and vitals reviewed.    ED Treatments / Results  Labs (all labs ordered are listed, but only abnormal results are displayed) Labs Reviewed  CBC WITH DIFFERENTIAL/PLATELET  COMPREHENSIVE METABOLIC PANEL  TROPONIN I    EKG  EKG Interpretation  Date/Time:  Sunday January 07 2017 13:54:52 EDT Ventricular Rate:  75 PR Interval:    QRS Duration: 89 QT Interval:  385 QTC Calculation: 430 R Axis:   34 Text Interpretation:  Sinus rhythm Prolonged PR interval Baseline wander Confirmed by Pattricia Boss [phone]) on 01/07/2017 3:04:55 PM       Radiology Dg Chest Port 1 View  Result Date: 01/07/2017 CLINICAL DATA:  Patient with cough.  COPD. EXAM: PORTABLE CHEST 1 VIEW COMPARISON:  Chest radiograph 12/27/2016 FINDINGS: Monitoring leads overlie the patient. Patient is mildly rotated. Stable enlarged cardiac and mediastinal contours. Persistent peripheral consolidative opacity right mid lung. Bibasilar atelectasis. No pleural effusion or pneumothorax. IMPRESSION: Persistent peripheral opacity within the right mid lung which may represent pneumonia in the appropriate clinical setting. Followup PA and lateral chest X-Hawley Michel is recommended in 3-4 weeks following trial of  antibiotic therapy to ensure resolution and exclude underlying malignancy. Electronically Signed   By: Lovey Newcomer  M.D.   On: 01/07/2017 14:35    Procedures Procedures (including critical care time)  Medications Ordered in ED Medications  methylPREDNISolone sodium succinate (SOLU-MEDROL) 125 mg/2 mL injection 125 mg (not administered)  albuterol (PROVENTIL) (2.5 MG/3ML) 0.083% nebulizer solution 5 mg (not administered)  ipratropium (ATROVENT) nebulizer solution 0.5 mg (not administered)  sodium chloride 0.9 % bolus 500 mL (not administered)     Initial Impression / Assessment and Plan / ED Course  I have reviewed the triage vital signs and the nursing notes.  Pertinent labs & imaging results that were available during my care of the patient were reviewed by me and considered in my medical decision making (see chart for details).     1-copd exacerbation/ cap- Patient given Solu-Medrol and albuterol here. Wheezing improved but continues with sats at 90% at rest- will give [place]. Possible pneumonia in right mid lung. Rocephin /zithromax ordered here- patient treated with levaquin op. 2-aki- mild increase in creatinine- patient is given 500cc bolus 3- smoking- patient continues to smoke. Discussed with Dr. Lorin Mercy and she will see for admission.  Final Clinical Impressions(s) / ED Diagnoses   Final diagnoses:  COPD exacerbation ([place])  Community acquired pneumonia of right lung, unspecified part of lung    New Prescriptions New Prescriptions   No medications on file     Pattricia Boss, MD 01/07/17 1540

## 2017-01-07 NOTE — ED Triage Notes (Signed)
Pt and daughter report pt had recent tx for pneumonia with Levaquin.  Finished this and then finished course of Prednisone.  Pt continues to have generalized weakness and productive cough.

## 2017-01-08 ENCOUNTER — Encounter (HOSPITAL_COMMUNITY): Payer: Self-pay | Admitting: Primary Care

## 2017-01-08 DIAGNOSIS — Z515 Encounter for palliative care: Secondary | ICD-10-CM

## 2017-01-08 DIAGNOSIS — Z7189 Other specified counseling: Secondary | ICD-10-CM

## 2017-01-08 DIAGNOSIS — J181 Lobar pneumonia, unspecified organism: Secondary | ICD-10-CM

## 2017-01-08 LAB — BASIC METABOLIC PANEL
ANION GAP: 9 (ref 5–15)
BUN: 23 mg/dL — ABNORMAL HIGH (ref 6–20)
CO2: 23 mmol/L (ref 22–32)
Calcium: 8.3 mg/dL — ABNORMAL LOW (ref 8.9–10.3)
Chloride: 97 mmol/L — ABNORMAL LOW (ref 101–111)
Creatinine, Ser: 1.22 mg/dL (ref 0.61–1.24)
GFR calc Af Amer: >60 mL/min (ref >60)
GFR, EST NON AFRICAN AMERICAN: 53 mL/min — AB (ref >60)
GLUCOSE: 155 mg/dL — AB (ref 65–99)
POTASSIUM: 3.7 mmol/L (ref 3.5–5.1)
Sodium: 129 mmol/L — ABNORMAL LOW (ref 135–145)

## 2017-01-08 LAB — CBC WITH DIFFERENTIAL/PLATELET
BASOS ABS: 0 10*3/uL (ref 0.0–0.1)
Basophils Relative: 0 %
EOS PCT: 0 %
Eosinophils Absolute: 0 10*3/uL (ref 0.0–0.7)
HEMATOCRIT: 30.8 % — AB (ref 39.0–52.0)
Hemoglobin: 10.8 g/dL — ABNORMAL LOW (ref 13.0–17.0)
LYMPHS ABS: 0.5 10*3/uL — AB (ref 0.7–4.0)
LYMPHS PCT: 2 %
MCH: 33.3 pg (ref 26.0–34.0)
MCHC: 35.1 g/dL (ref 30.0–36.0)
MCV: 95.1 fL (ref 78.0–100.0)
MONO ABS: 0.3 10*3/uL (ref 0.1–1.0)
Monocytes Relative: 2 %
NEUTROS ABS: 20.1 10*3/uL — AB (ref 1.7–7.7)
Neutrophils Relative %: 96 %
PLATELETS: 319 10*3/uL (ref 150–400)
RBC: 3.24 MIL/uL — ABNORMAL LOW (ref 4.22–5.81)
RDW: 14.4 % (ref 11.5–15.5)
WBC: 21 10*3/uL — ABNORMAL HIGH (ref 4.0–10.5)

## 2017-01-08 LAB — STREP PNEUMONIAE URINARY ANTIGEN: STREP PNEUMO URINARY ANTIGEN: NEGATIVE

## 2017-01-08 MED ORDER — GUAIFENESIN ER 600 MG PO TB12
1200.0000 mg | ORAL_TABLET | Freq: Two times a day (BID) | ORAL | Status: DC
  Administered 2017-01-08 – 2017-01-11 (×6): 1200 mg via ORAL
  Filled 2017-01-08 (×7): qty 2

## 2017-01-08 NOTE — Progress Notes (Signed)
PROGRESS NOTE    FODAY CONE  ZOX:[id number] DOB: 29-Jun-1931 DOA: 01/07/2017 PCP: Curlene Labrum, MD    Brief Narrative: 83 year old male admitted to the hospital with community-acquired pneumonia and COPD exacerbation. On antibiotics, IV steroids, bronchodilators. Palliative care following to help address goals of care.   Assessment & Plan:   Principal Problem:   Pneumonia Active Problems:   TOBACCO USER   COPD with emphysema ([place])   Essential hypertension   Other specified hypothyroidism   Dementia   AKI (acute kidney injury) ([place])   Hyperglycemia   Palliative care encounter   Goals of care, counseling/discussion   1. Community-acquired pneumonia. Continue on ceftriaxone and doxycycline. Continue pulmonary hygiene. 2. COPD exacerbation. On IV steroids, antibiotics and bronchodilators. Wheezing appears to be improving. If he has continued improvement, can consider starting prednisone taper tomorrow 3. Acute kidney injury. Mild improvement with IV fluids. Recheck in a.m . 4. Hypertension. Continue Norvasc 5. Hypothyroidism. Continue Synthroid. TSH normal 6. Dementia. Continue on Aricept. 7. Hyperglycemia. Suspect is related to steroids. Continue to monitor. 8. Leukocytosis. The patient is coming off the prednisone taper and is now on intravenous Solu-Medrol. He does not appear septic or toxic. Continue to monitor.   DVT prophylaxis: Lovenox Code Status: DO NOT RESUSCITATE Family Communication: Discussed with daughter at the bedside Disposition Plan: Discharge home once improved   Consultants:   Palliative care  Procedures:     Antimicrobials:   Ceftriaxone 9/9>>  Doxycycline 9/9>>    Subjective: Feels that breathing is doing a little better today. Continues to have productive cough.  Objective: Vitals:   01/08/17 0231 01/08/17 0753 01/08/17 1400 01/08/17 1629  BP:   (!) 125/55   Pulse:   91   Resp:   18   Temp:   98.9 F (37.2 C)   TempSrc:    Oral   SpO2: 94% (!) 89% 94% 90%  Weight:      Height:        Intake/Output Summary (Last 24 hours) at 01/08/17 1906 Last data filed at 01/08/17 1500  Gross per 24 hour  Intake          2441.25 ml  Output              400 ml  Net          2041.25 ml   Filed Weights   01/07/17 1400  Weight: 63.5 kg (140 lb)    Examination:  General exam: Appears calm and comfortable  Respiratory system: Bilateral rhonchi with mild wheezes bilaterally Cardiovascular system: S1 & S2 heard, RRR. No JVD, murmurs, rubs, gallops or clicks. No pedal edema. Gastrointestinal system: Abdomen is nondistended, soft and nontender. No organomegaly or masses felt. Normal bowel sounds heard. Central nervous system: Alert and oriented. No focal neurological deficits. Extremities: Symmetric 5 x 5 power. Skin: No rashes, lesions or ulcers Psychiatry: Judgement and insight appear normal. Mood & affect appropriate.     Data Reviewed: I have personally reviewed following labs and imaging studies  CBC:  Recent Labs Lab 01/07/17 1407 01/08/17 0554  WBC 20.6* 21.0*  NEUTROABS 18.1* 20.1*  HGB 12.9* 10.8*  HCT 36.8* 30.8*  MCV 95.3 95.1  PLT 335 811   Basic Metabolic Panel:  Recent Labs Lab 01/07/17 1407 01/08/17 0554  NA 130* 129*  K 3.6 3.7  CL 93* 97*  CO2 26 23  GLUCOSE 124* 155*  BUN 21* 23*  CREATININE 1.34* 1.22  CALCIUM 9.4 8.3*  GFR: Estimated Creatinine Clearance: 41.2 mL/min (by C-G formula based on SCr of 1.22 mg/dL). Liver Function Tests:  Recent Labs Lab 01/07/17 1407  AST 40  ALT 21  ALKPHOS 74  BILITOT 0.9  PROT 6.5  ALBUMIN 3.7   No results for input(s): LIPASE, AMYLASE in the last 168 hours. No results for input(s): AMMONIA in the last 168 hours. Coagulation Profile: No results for input(s): INR, PROTIME in the last 168 hours. Cardiac Enzymes:  Recent Labs Lab 01/07/17 1407  TROPONINI <0.03   BNP (last 3 results) No results for input(s): PROBNP in the  last 8760 hours. HbA1C: No results for input(s): HGBA1C in the last 72 hours. CBG: No results for input(s): GLUCAP in the last 168 hours. Lipid Profile: No results for input(s): CHOL, HDL, LDLCALC, TRIG, CHOLHDL, LDLDIRECT in the last 72 hours. Thyroid Function Tests:  Recent Labs  01/07/17 1422  TSH 1.135   Anemia Panel: No results for input(s): VITAMINB12, FOLATE, FERRITIN, TIBC, IRON, RETICCTPCT in the last 72 hours. Sepsis Labs:  Recent Labs Lab 01/07/17 1422  PROCALCITON <0.10    Recent Results (from the past 240 hour(s))  Culture, blood (routine x 2) Call MD if unable to obtain prior to antibiotics being given     Status: None (Preliminary result)   Collection Time: 01/07/17  7:14 PM  Result Value Ref Range Status   Specimen Description BLOOD RIGHT ARM  Final   Special Requests Blood Culture adequate volume  Final   Culture NO GROWTH < 24 HOURS  Final   Report Status PENDING  Incomplete  Culture, blood (routine x 2) Call MD if unable to obtain prior to antibiotics being given     Status: None (Preliminary result)   Collection Time: 01/07/17  7:19 PM  Result Value Ref Range Status   Specimen Description BLOOD RIGHT ARM  Final   Special Requests Blood Culture adequate volume  Final   Culture NO GROWTH < 24 HOURS  Final   Report Status PENDING  Incomplete  Culture, sputum-assessment     Status: None (Preliminary result)   Collection Time: 01/08/17 10:00 AM  Result Value Ref Range Status   Specimen Description EXPECTORATED SPUTUM  Final   Special Requests Normal  Final   Sputum evaluation   Final    Sputum specimen not acceptable for testing.  Please recollect.   CALLED TO MCGIBBONY,C AT 1218 BY HUFFINES,S ON 01/08/17.    Report Status PENDING  Incomplete         Radiology Studies: Dg Chest Port 1 View  Result Date: 01/07/2017 CLINICAL DATA:  Patient with cough.  COPD. EXAM: PORTABLE CHEST 1 VIEW COMPARISON:  Chest radiograph 12/27/2016 FINDINGS: Monitoring  leads overlie the patient. Patient is mildly rotated. Stable enlarged cardiac and mediastinal contours. Persistent peripheral consolidative opacity right mid lung. Bibasilar atelectasis. No pleural effusion or pneumothorax. IMPRESSION: Persistent peripheral opacity within the right mid lung which may represent pneumonia in the appropriate clinical setting. Followup PA and lateral chest X-ray is recommended in 3-4 weeks following trial of antibiotic therapy to ensure resolution and exclude underlying malignancy. Electronically Signed   By: Lovey Newcomer M.D.   On: 01/07/2017 14:35        Scheduled Meds: . amLODipine  5 mg Oral q morning - 10a  . aspirin EC  81 mg Oral q morning - 10a  . atorvastatin  40 mg Oral QHS  . donepezil  5 mg Oral Daily  . doxycycline  100 mg  Oral Q12H  . enoxaparin (LOVENOX) injection  40 mg Subcutaneous Q24H  . famotidine  20 mg Oral Daily  . feeding supplement (ENSURE ENLIVE)  237 mL Oral BID BM  . fluticasone  2 spray Each Nare Daily  . ipratropium-albuterol  3 mL Nebulization Q6H  . levothyroxine  125 mcg Oral q morning - 10a  . loratadine  10 mg Oral Daily  . methylPREDNISolone (SOLU-MEDROL) injection  60 mg Intravenous Q12H  . mometasone-formoterol  2 puff Inhalation BID  . nicotine  14 mg Transdermal Daily  . ranolazine  500 mg Oral BID  . venlafaxine XR  75 mg Oral BH-q7a   Continuous Infusions: . sodium chloride 75 mL/hr at 01/08/17 0949  . cefTRIAXone (ROCEPHIN)  IV Stopped (01/08/17 1852)     LOS: 1 day    Time spent: 67mins    Thomasene Dubow, MD [facility] Pager [phone]  If 7PM-7AM, please contact night-coverage [email or web address] Password [email or web address] 01/08/2017, 7:06 PM

## 2017-01-08 NOTE — Evaluation (Addendum)
Physical Therapy Evaluation Patient Details Name: Michael [name] MRN: [id number] DOB: [birth year] Today's Date: 01/08/2017   History of Present Illness  Candy L Drummonds is a 83 y.o. male with medical history significant of renal and prostate cancers; hypothyroidism; HTN; HLD; GERD; dementia; COPD; and CAD presenting with progressive weakness.  On 8/29, he went to the doctor because he was gettting weaker, not feeling well.  Diagnosed with PNA after CXR and placed on Levaquin x 5 days.  Completed abx and when the doctor called back to check on him on 9/4, he was still not feeling well, weak, decreased PO, no energy.  Was given a prednisone dose pack and he just finished that today.  He had no response to that medication.  Continuing to get weaker, more unsteady, no appetite.  This AM, his daughter came to check on him about 9 and he was on the floor in the bathroom.  He said he didn't fall but felt weak and sat down.  They got him back into the bed.  He was wheezing loudly, which he has been doing for the last several days.  +hot - ?fevers.  Some cough, occasionally productive of yellowish sputum.  He has a lot of urinary urgency with incontinence at baseline.  Very sedentary, has back pain that he complains is his kidneys but daughters think it might be related to minimal activity.    Clinical Impression  Patient demonstrates slow labored functional mobility and gait with increased risk for falls due to BLE weakess,c/o fatigue and poor balance.  Patient will benefit from continued physical therapy in hospital and recommended venue below to increase strength, balance, endurance for safe ADLs and gait.    Follow Up Recommendations Home health PT;Supervision/Assistance - 24 hour    Equipment Recommendations  Rolling walker with 5" wheels    Recommendations for Other Services       Precautions / Restrictions Precautions Precautions: Fall Restrictions Weight Bearing Restrictions: No       Mobility  Bed Mobility Overal bed mobility: Needs Assistance Bed Mobility: Supine to Sit;Sit to Supine     Supine to sit: Supervision Sit to supine: Supervision      Transfers Overall transfer level: Needs assistance Equipment used: Rolling walker (2 wheeled) Transfers: Sit to/from [facility] Sit to Stand: Min assist Stand pivot transfers: Min assist          Ambulation/Gait Ambulation/Gait assistance: Min assist Ambulation Distance (Feet): 35 Feet Assistive device: Rolling walker (2 wheeled) Gait Pattern/deviations: Decreased step length - right;Decreased step length - left;Decreased stride length   Gait velocity interpretation: Below normal speed for age/gender General Gait Details: slow labored cadence with wider base of support, limited secondary to c/o fatigue  Stairs            Wheelchair Mobility    Modified Rankin (Stroke Patients Only)       Balance Overall balance assessment: Needs assistance Sitting-balance support: Feet supported;No upper extremity supported Sitting balance-Leahy Scale: Good     Standing balance support: Bilateral upper extremity supported;During functional activity Standing balance-Leahy Scale: Poor                               Pertinent Vitals/Pain Pain Assessment: No/denies pain    Home Living Family/patient expects to be discharged to:: Private residence Living Arrangements: Children;Non-relatives/Friends;Alone (has sitter in morning and daughter prepares diners in evening) Available Help at Discharge: Family;Personal care attendant  Type of Home: House Home Access: Stairs to enter Entrance Stairs-Rails: Can reach both Entrance Stairs-Number of Steps: 6 Home Layout: One level Home Equipment: Walker - 2 wheels Additional Comments: has access to another relatives walker    Prior Function Level of Independence: Needs assistance   Gait / Transfers Assistance Needed: mostly leaning on  furniture to walk in house  ADL's / [place] Needed: assisted by caregivers/daughter        Hand Dominance        Extremity/Trunk Assessment   Upper Extremity Assessment Upper Extremity Assessment: Generalized weakness    Lower Extremity Assessment Lower Extremity Assessment: Generalized weakness    Cervical / Trunk Assessment Cervical / Trunk Assessment: Normal  Communication   Communication: No difficulties  Cognition Arousal/Alertness: Awake/alert Behavior During Therapy: WFL for tasks assessed/performed Overall Cognitive Status: Within Functional Limits for tasks assessed                                        General Comments      Exercises     Assessment/Plan    PT Assessment Patient needs continued PT services  PT Problem List Decreased strength;Decreased activity tolerance;Decreased balance;Decreased mobility       PT Treatment Interventions Gait training;Stair training;Functional mobility training;Therapeutic activities;Therapeutic exercise;Patient/family education    PT Goals (Current goals can be found in the Care Plan section)  Acute Rehab PT Goals Patient Stated Goal: return home with family to assist PT Goal Formulation: With patient/family Time For Goal Achievement: 01/15/17 Potential to Achieve Goals: Good    Frequency Min 3X/week   Barriers to discharge        Co-evaluation               AM-PAC PT "6 Clicks" Daily Activity  Outcome Measure Difficulty turning over in bed (including adjusting bedclothes, sheets and blankets)?: None Difficulty moving from lying on back to sitting on the side of the bed? : None Difficulty sitting down on and standing up from a chair with arms (e.g., wheelchair, bedside commode, etc,.)?: None Help needed moving to and from a bed to chair (including a wheelchair)?: A Little Help needed walking in hospital room?: A Little Help needed climbing 3-5 steps with a railing? : A  Lot 6 Click Score: 20    End of Session Equipment Utilized During Treatment: Gait belt Activity Tolerance: Patient tolerated treatment well Patient left: in chair;with call bell/phone within reach;with family/visitor present Nurse Communication: Mobility status PT Visit Diagnosis: Unsteadiness on feet (R26.81);Other abnormalities of gait and mobility (R26.89);Muscle weakness (generalized) (M62.81);History of falling (Z91.81)   Time: 1056-1130 PT Time Calculation (min) (ACUTE ONLY): 34 min   Charges:   PT Evaluation $PT Eval Low Complexity: 1 Low PT Treatments $Therapeutic Activity: 23-37 mins   PT G Codes:        1:34 PM, Feb 05, 2017 Lonell Grandchild, MPT Physical Therapist with [facility] [phone] office [phone] mobile phone

## 2017-01-08 NOTE — ACP (Advance Care Planning) (Signed)
HC POA/AD completed in 2007. Request copy for chart. DNR, allowing natural death. We discussed the concepts of let nature take its course, which is not appropriate for Mr. Jenny at this time.

## 2017-01-08 NOTE — Consult Note (Signed)
Consultation Note Date: 01/08/2017   Patient Name: Michael [name]  DOB: 07-Feb-1932  MRN: [id number]  Age / Sex: 83 y.o., male  PCP: Michael Labrum, MD Referring Physician: Kathie Dike, MD  Reason for Consultation: Establishing goals of care and Psychosocial/spiritual support  HPI/Patient Profile: 83 y.o. male  with past medical history of Anxiety and depression, COPD current smoker one half pack per day, carotid artery occlusion bilateral indirect to me, dementia (which seems to be middle stages), high blood pressure and high cholesterol, prostate cancer, not being treated currently, hypothyroidism, GERD admitted on 01/07/2017 with pneumonia with COPD exacerbation.   Clinical Assessment and Goals of Care: Michael [name] is sitting quietly in the [place] chair in his room. He greets me making and keeping eye contact as I enter. He is oriented to person and place, but not time. Present at bedside today is his daughter, Michael [name]. We talk about Michael [name] chronic health history including his dementia and smoking. We talk about the current treatment plan including IV antibiotics, and current labs including white blood cells of 20, albumin of 3.7, BUN and creatinine levels, blood and sputum cultures pending.   Michael [name] states that she has seen the functional decline for her father. She states that she understands where he is, nearing end-of-life. I share a diagram of the chronic illness pathway, what is normal and expected. Michael [name] states that she can see these changes in her father. She also shares that she is concerned that some of her siblings do not understand. We talk about the dementia pathway in particular including frailty, functional decline, and the decreased his desire to eat. She shares that he completed healthcare power of attorney/advanced directives in 07-25-05 their mother died of cancer. See below.   We talk  about Michael [name] home life. Michael [name] states that they have a paid aid from about 930 to 1 PM. The 8 assists with breakfast, light housekeeping. Michael [name] states that he is able to take his own bath in the shower, but Michael [name] and I both question that. She is requesting information on how to contact the [place] for more additional services, and is working on more private pay in home services. We talk about SNF/rehab. Michael [name] is states that they have had difficulty in the past with behaviors due to Michael [name] dementia. The preference is for him to stay at home. I share that this is a wise choice.  I share the "[employer] playbook" information.  I also share a "Hard choices for loving people" book.  Health Care Power of Atty HCPOA - daughter Michael [name] states she and her brother Michael [name] share [place]. I ask for her to review paperwork and bring to hospital.    SUMMARY OF RECOMMENDATIONS   Continue to treat the treatable with hospitalization.  Code Status/Advance Care Planning: DNR - Michael [name] has advanced directives completed in Jul 25, 2005. Copy requested for chart. We discuss the concept of allowing natural death (DNR), and the concept of let nature take its course. I  share that I do not feel that Michael [name] is at the point to let nature take its course at this time, daughter agrees.  Symptom Management:   per hospitalist, no additional needs at this time.  Palliative Prophylaxis:   Aspiration  Additional Recommendations (Limitations, Scope, Preferences):  Continue to treat the treatable but no extraordinary measures such as CPR or intubation.  Psycho-social/Spiritual:   Desire for further Chaplaincy support:no  Additional Recommendations: Caregiving  Support/Resources and Education on Hospice  Prognosis:   < 12 months, would not be surprising based on middle stage dementia, functional decline, reduced desire to eat/drink.  Discharge Planning: Home with the benefits of current home health aide,  seeking additional help from [place], private pay source.      Primary Diagnoses: Present on Admission: . Pneumonia . COPD with emphysema ([place]) . Essential hypertension . Other specified hypothyroidism . TOBACCO USER . Dementia . AKI (acute kidney injury) ([place]) . Hyperglycemia   I have reviewed the medical record, interviewed the patient and family, and examined the patient. The following aspects are pertinent.  Past Medical History:  Diagnosis Date  . Allergic rhinitis   . Anemia   . Anxiety   . Arthritis   . Carotid artery occlusion   . COPD (chronic obstructive pulmonary disease) ([place])   . Coronary atherosclerosis of native coronary artery    BMS RCA 2003, DES RCA 2004, DES RCA 2005; last cath 2017   . Dementia   . Depression   . Essential hypertension   . GERD (gastroesophageal reflux disease)   . Hemorrhoids   . History of asbestos exposure   . History of herpes zoster 2001  . HOH (hard of hearing)   . Hyperthyroidism 1978   Status post radioactive iodine treatment  . Hypothyroidism   . Lumbar pain   . Mixed hyperlipidemia   . Peripheral arterial disease ([place])   . Pneumonia November 2014  . Prostate cancer [facility])    Status post XRT/IMRT 2008 - Dr. Alinda [name]  . Psoriasis   . Renal cancer ([place])    Cryoablation in 2012   Social History   Social History  . Marital status: Widowed    Spouse name: N/A  . Number of children: N/A  . Years of education: N/A   Occupational History  . Retired      [employer]   Social History Main Topics  . Smoking status: Current Every Day Smoker    Packs/day: 0.50    Years: 66.00    Types: Cigarettes    Start date: 10/20/1943  . Smokeless tobacco: Never Used     Comment: 1 pack every 3-4 days   . Alcohol use No  . Drug use: No  . Sexual activity: Not Currently   Other Topics Concern  . None   Social History Narrative  . None   Family History  Problem Relation Age of Onset  . Coronary artery disease  Brother   . Cancer Brother   . Heart disease Brother   . Heart attack Brother   . Anesthesia problems Neg Hx    Scheduled Meds: . amLODipine  5 mg Oral q morning - 10a  . aspirin EC  81 mg Oral q morning - 10a  . atorvastatin  40 mg Oral QHS  . donepezil  5 mg Oral Daily  . doxycycline  100 mg Oral Q12H  . enoxaparin (LOVENOX) injection  40 mg Subcutaneous Q24H  . famotidine  20 mg Oral Daily  .  feeding supplement (ENSURE ENLIVE)  237 mL Oral BID BM  . fluticasone  2 spray Each Nare Daily  . ipratropium-albuterol  3 mL Nebulization Q6H  . levothyroxine  125 mcg Oral q morning - 10a  . loratadine  10 mg Oral Daily  . methylPREDNISolone (SOLU-MEDROL) injection  60 mg Intravenous Q12H  . mometasone-formoterol  2 puff Inhalation BID  . nicotine  14 mg Transdermal Daily  . ranolazine  500 mg Oral BID  . venlafaxine XR  75 mg Oral BH-q7a   Continuous Infusions: . sodium chloride 75 mL/hr at 01/08/17 0949  . cefTRIAXone (ROCEPHIN)  IV Stopped (01/07/17 2328)   PRN Meds:.albuterol, traZODone Medications Prior to Admission:  Prior to Admission medications   Medication Sig Start Date End Date Taking? Authorizing Provider  albuterol (PROAIR HFA) 108 (90 BASE) MCG/ACT inhaler Inhale 2 puffs into the lungs every 6 (six) hours as needed. For shortness of breath   Yes [provider]  amLODipine (NORVASC) 5 MG tablet TAKE 1 TABLET EVERY MORNING 08/21/16  Yes Satira Sark, MD  aspirin 81 MG tablet Take 81 mg by mouth every morning.    Yes [provider]  atorvastatin (LIPITOR) 40 MG tablet Take 40 mg by mouth at bedtime.    Yes [provider]  cetirizine (ZYRTEC) 10 MG tablet Take 10 mg by mouth every morning.    Yes [provider]  donepezil (ARICEPT) 5 MG tablet Take 5 mg by mouth daily.    Yes [provider]  ferrous sulfate 325 (65 FE) MG tablet Take 325 mg by mouth at bedtime.    Yes [provider]  Fluticasone-Salmeterol  (ADVAIR DISKUS) 250-50 MCG/DOSE AEPB Inhale 1 puff into the lungs every 12 (twelve) hours.     Yes [provider]  levothyroxine (SYNTHROID, LEVOTHROID) 125 MCG tablet Take 125 mcg by mouth every morning.    Yes [provider]  mometasone (NASONEX) 50 MCG/ACT nasal spray Place 2 sprays into the nose at bedtime. 08/26/15  Yes Satira Sark, MD  Multiple Vitamin (MULTIVITAMIN) tablet Take 1 tablet by mouth every morning.    Yes [provider]  nitroGLYCERIN (NITROSTAT) 0.4 MG SL tablet Place 1 tablet (0.4 mg total) under the tongue every 5 (five) minutes as needed. For chest pain 08/27/15  Yes Satira Sark, MD  ranitidine (ZANTAC) 150 MG tablet Take 150 mg by mouth every morning.    Yes [provider]  ranolazine (RANEXA) 500 MG 12 hr tablet Take 1 tablet (500 mg total) by mouth 2 (two) times daily. 08/20/15  Yes Satira Sark, MD  traZODone (DESYREL) 100 MG tablet Take 100 mg by mouth at bedtime as needed. For sleep   Yes [provider]  venlafaxine XR (EFFEXOR-XR) 75 MG 24 hr capsule Take 75 mg by mouth every morning.   Yes [provider]   Allergies  Allergen Reactions  . Hydrocodone-Acetaminophen Rash and Other (See Comments)    Confusion  . Oxycodone Hives  . Penicillins Hives    Has patient had a PCN reaction causing immediate rash, facial/tongue/throat swelling, SOB or lightheadedness with hypotension:  no Has patient had a PCN reaction causing severe rash involving mucus membranes or skin necrosis: yes hives Has patient had a PCN reaction that required hospitalization no Has patient had a PCN reaction occurring within the last 10 years: Yes If all of the above answers are "NO", then may proceed with Cephalo  . Lyrica [Pregabalin]  Other (See Comments)  . Morphine And Related Other (See Comments)  . Ciprofloxacin Nausea Only  . Iron Itching   Review of Systems  Unable to perform ROS: Dementia    Physical Exam    Constitutional: No distress.  HENT:  Head: Atraumatic.  Cardiovascular: Normal rate and regular rhythm.   Pulmonary/Chest: Effort normal. No respiratory distress.  Abdominal: Soft. He exhibits no distension.  Musculoskeletal: He exhibits no edema.  Neurological: He is alert.  Known dementia  Skin: Skin is warm and dry.  Nursing note and vitals reviewed.   Vital Signs: BP (!) 125/55 (BP Location: Left Arm)   Pulse 91   Temp 98.9 F (37.2 C) (Oral)   Resp 18   Ht 5\' 8"  (1.727 m)   Wt 63.5 kg (140 lb)   SpO2 94%   BMI 21.29 kg/m  Pain Assessment: No/denies pain POSS *See Group Information*: 1-Acceptable,Awake and alert Pain Score: Asleep   SpO2: SpO2: 94 % O2 Device:SpO2: 94 % O2 Flow Rate: .O2 Flow Rate (L/min): 2 L/min  IO: Intake/output summary:  Intake/Output Summary (Last 24 hours) at 01/08/17 1451 Last data filed at 01/08/17 1047  Gross per 24 hour  Intake          1981.25 ml  Output              400 ml  Net          1581.25 ml    LBM: Last BM Date: 01/07/17 Baseline Weight: Weight: 63.5 kg (140 lb) Most recent weight: Weight: 63.5 kg (140 lb)     Palliative Assessment/Data:   Flowsheet Rows     Most Recent Value  Intake Tab  Referral Department  Hospitalist  Unit at Time of Referral  Med/Surg Unit  Palliative Care Primary Diagnosis  Pulmonary  Date Notified  01/07/17  Palliative Care Type  New Palliative care  Reason for referral  Clarify Goals of Care, Psychosocial or Spiritual support  Date of Admission  01/07/17  Date first seen by Palliative Care  01/08/17  # of days Palliative referral response time  1 Day(s)  # of days IP prior to Palliative referral  0  Clinical Assessment  Palliative Performance Scale Score  40%  Pain Max last 24 hours  Not able to report  Pain Min Last 24 hours  Not able to report  Dyspnea Max Last 24 Hours  Not able to report  Dyspnea Min Last 24 hours  Not able to report  Psychosocial & Spiritual Assessment   Palliative Care Outcomes  Patient/Family meeting held?  Yes  Who was at the meeting?  patient and daughter Michael [name]  Palliative Care Outcomes  Clarified goals of care, Provided advance care planning  Patient/Family wishes: Interventions discontinued/not started   Mechanical Ventilation      Time In: 1300 Time Out: 1410 Time Total: 70 minutes Greater than 50%  of this time was spent counseling and coordinating care related to the above assessment and plan.  Signed by: Drue Novel, NP   Please contact Palliative Medicine Team phone at [phone] for questions and concerns.  For individual provider: See Shea Evans

## 2017-01-08 NOTE — Progress Notes (Signed)
OT Cancellation Note  Patient Details Name: EMILLIANO DILWORTH MRN: [id number] DOB: 1931/09/03   Cancelled Treatment:    Reason Eval/Treat Not Completed: Patient at procedure or test/ unavailable. Pt with nursing when OT attempted to see patient this AM. Will re-attempt evaluation at a later time.    Ailene Ravel, OTR/L,CBIS  [phone]  01/08/2017, 10:08 AM

## 2017-01-08 NOTE — Progress Notes (Signed)
Initial Nutrition Assessment  DOCUMENTATION CODES:   Severe malnutrition in context of chronic illness  INTERVENTION:  - Continue Ensure Enlive po BID, each supplement provides 350 kcal and 20 grams of protein - Provide snacks BID  NUTRITION DIAGNOSIS:   Malnutrition (severe) related to chronic illness (COPD/Dementia) as evidenced by severe depletion of muscle mass, severe depletion of body fat.  GOAL:   Patient will meet greater than or equal to 90% of their needs  MONITOR:   PO intake, Supplement acceptance, Weight trends, I & O's  REASON FOR ASSESSMENT:   Consult, Malnutrition Screening Tool Assessment of nutrition requirement/status  ASSESSMENT:   Pt with PMH of HTN, HLD, GERD, COPD, Dementia, and hx of renal and prostate cancer presented with progressive weakness. Pt was found to have pneumonia likely accompanied by COPD exacerbation per MD note.   Spoke with pt sitting up in bedside chair and pt's daughter.   Pt reports an increase in appetite since admission.   Pt's daughter reports pt consumed a good dinner last night and a good breakfast this morning. Pt's daughter also reports pt consumed some of the fruit she brought from home in addition to meal tray. Per chart review, pt consumed ~85% of breakfast this morning, no other meal percentages recorded.   Pt's daughter reports pt typically consumes 2 meals/d. First meal late morning breakfast consisting of 2 eggs, toast and fruit brought by a care giver. For dinner pt's daughter states she alternates between her four siblings who brings the pt dinner and reporting type and amount varies.   Pt's daughter reports pt has experienced memory loss and before the siblings started to help out at meal time pt would forget to eat, but did not elaborate further on this.   Pt and pt's daughter are unsure of weight loss amount or time frame but report a UBW of ~165 lbs and that the pt's clothes have been "falling off" of him. Per  chart no recent weight history available, last weight recorded was 08/14/16 of 140 lbs.   Nutrition focused physical exam completed. Findings include moderate to severe muscle depletions and moderate to severe fat depletions, and no edema.  Labs reviewed; Na 129 Medications reviewed; Pepcid, Solu-medrol  Diet Order:  Diet Heart Room service appropriate? Yes; Fluid consistency: Thin  Skin:  Reviewed, no issues  Last BM:  01/07/17  Height:   Ht Readings from Last 1 Encounters:  01/07/17 5\' 8"  (1.727 m)   Weight:   Wt Readings from Last 1 Encounters:  01/07/17 140 lb (63.5 kg)   Ideal Body Weight:  70 kg  BMI:  Body mass index is 21.29 kg/m.  Estimated Nutritional Needs:   Kcal:  1700-1900  Protein:  85-95 grams  Fluid:  >/= 1.7 L/d  EDUCATION NEEDS:   Education needs addressed   Parks Ranger, MS, RDN, LDN 01/08/2017 12:49 PM

## 2017-01-08 NOTE — Clinical Social Work Note (Signed)
Patient does not have the required three admissions within a six month time period to meet COPD Gold criteria.  LCSW signing off.      Zevin Nevares, Clydene Pugh, LCSW

## 2017-01-09 LAB — BASIC METABOLIC PANEL
ANION GAP: 6 (ref 5–15)
BUN: 30 mg/dL — AB (ref 6–20)
CALCIUM: 8.1 mg/dL — AB (ref 8.9–10.3)
CO2: 25 mmol/L (ref 22–32)
CREATININE: 1.06 mg/dL (ref 0.61–1.24)
Chloride: 96 mmol/L — ABNORMAL LOW (ref 101–111)
GFR calc Af Amer: >60 mL/min (ref >60)
GLUCOSE: 120 mg/dL — AB (ref 65–99)
Potassium: 3.8 mmol/L (ref 3.5–5.1)
Sodium: 127 mmol/L — ABNORMAL LOW (ref 135–145)

## 2017-01-09 LAB — EXPECTORATED SPUTUM ASSESSMENT W REFEX TO RESP CULTURE: SPECIAL REQUESTS: NORMAL

## 2017-01-09 LAB — EXPECTORATED SPUTUM ASSESSMENT W GRAM STAIN, RFLX TO RESP C

## 2017-01-09 LAB — PROCALCITONIN

## 2017-01-09 MED ORDER — PREDNISONE 20 MG PO TABS
40.0000 mg | ORAL_TABLET | Freq: Every day | ORAL | Status: DC
  Administered 2017-01-10 – 2017-01-11 (×2): 40 mg via ORAL
  Filled 2017-01-09 (×2): qty 2

## 2017-01-09 MED ORDER — IPRATROPIUM-ALBUTEROL 0.5-2.5 (3) MG/3ML IN SOLN
3.0000 mL | RESPIRATORY_TRACT | Status: DC
  Administered 2017-01-09 – 2017-01-10 (×5): 3 mL via RESPIRATORY_TRACT
  Filled 2017-01-09 (×6): qty 3

## 2017-01-09 NOTE — Progress Notes (Signed)
Patient woke up confused, standing at bedside. IV tubing was disconnected from IV. Patient stated that he was going home. Daughter was in the room with him at that time. Patient reoriented, asked why he was here. Gown and all linen changed from IVF leaking. Patient back resting in bed at this time. Daughter at bedside. Bed alarm on. Will continue to monitor.

## 2017-01-09 NOTE — Progress Notes (Signed)
PROGRESS NOTE    Michael [name]  XFG:[id number] DOB: [birth year] DOA: 01/07/2017 PCP: Curlene Labrum, MD    Brief Narrative: 83 year old male admitted to the hospital with community-acquired pneumonia and COPD exacerbation. On antibiotics, IV steroids, bronchodilators. Palliative care following to help address goals of care. He has been slowly improving. Sodium has been trending down. Anticipate discharge in the next 24-48 hours.   Assessment & Plan:   Principal Problem:   Pneumonia Active Problems:   TOBACCO USER   COPD with emphysema ([place])   Essential hypertension   Other specified hypothyroidism   Dementia   AKI (acute kidney injury) ([place])   Hyperglycemia   Palliative care encounter   Goals of care, counseling/discussion   1. Community-acquired pneumonia. Continue on ceftriaxone and doxycycline. Continue pulmonary hygiene. Overall cough and shortness of breath improving. 2. COPD exacerbation. On IV steroids, antibiotics and bronchodilators. Wheezing appears to be improving. Will transition steroids to prednisone taper. May need to discharge with supplemental oxygen. Will do home oxygen evaluation in AM. 3. Acute kidney injury. Related to dehydration. Improvement with IV fluids.  4. Hyponatremia. Unclear etiology. He has been on normal saline and sodium has been trending down. Appears to be euvolemic. ?SIADH. Will check urine sodium. Hold further IV fluids. Recheck in AM 5. Hypertension. Continue Norvasc, blood pressure stable 6. Hypothyroidism. Continue Synthroid. TSH normal 7. Dementia. Continue on Aricept. 8. Hyperglycemia. Suspect is related to steroids. Continue to monitor. 9. Leukocytosis. The patient is coming off the prednisone taper and is now on intravenous Solu-Medrol. He does not appear septic or toxic. Continue to monitor.   DVT prophylaxis: Lovenox Code Status: DO NOT RESUSCITATE Family Communication: Discussed with son at the bedside Disposition Plan:  Discharge home once improved   Consultants:   Palliative care  Procedures:     Antimicrobials:   Ceftriaxone 9/9>>  Doxycycline 9/9>>    Subjective: Breathing continues to improve, cough is less productive. No chest pain  Objective: Vitals:   01/09/17 1140 01/09/17 1415 01/09/17 1514 01/09/17 1957  BP:  133/62    Pulse:  87    Resp:  18    Temp:  98.3 F (36.8 C)    TempSrc:      SpO2: 92% 94% 92% 91%  Weight:      Height:        Intake/Output Summary (Last 24 hours) at 01/09/17 2010 Last data filed at 01/09/17 1700  Gross per 24 hour  Intake             1620 ml  Output             1375 ml  Net              245 ml   Filed Weights   01/07/17 1400  Weight: 63.5 kg (140 lb)    Examination:  General exam: Appears calm and comfortable  Respiratory system: Bilateral rhonchi with mild wheezes bilaterally Cardiovascular system: S1 & S2 heard, RRR. No JVD, murmurs, rubs, gallops or clicks. No pedal edema. Gastrointestinal system: Abdomen is nondistended, soft and nontender. No organomegaly or masses felt. Normal bowel sounds heard. Central nervous system: Alert and oriented. No focal neurological deficits. Extremities: Symmetric 5 x 5 power. Skin: No rashes, lesions or ulcers Psychiatry: Judgement and insight appear normal. Mood & affect appropriate.     Data Reviewed: I have personally reviewed following labs and imaging studies  CBC:  Recent Labs Lab 01/07/17 1407 01/08/17 0554  WBC 20.6*  21.0*  NEUTROABS 18.1* 20.1*  HGB 12.9* 10.8*  HCT 36.8* 30.8*  MCV 95.3 95.1  PLT 335 546   Basic Metabolic Panel:  Recent Labs Lab 01/07/17 1407 01/08/17 0554 01/09/17 0406  NA 130* 129* 127*  K 3.6 3.7 3.8  CL 93* 97* 96*  CO2 26 23 25   GLUCOSE 124* 155* 120*  BUN 21* 23* 30*  CREATININE 1.34* 1.22 1.06  CALCIUM 9.4 8.3* 8.1*   GFR: Estimated Creatinine Clearance: 47.4 mL/min (by C-G formula based on SCr of 1.06 mg/dL). Liver Function  Tests:  Recent Labs Lab 01/07/17 1407  AST 40  ALT 21  ALKPHOS 74  BILITOT 0.9  PROT 6.5  ALBUMIN 3.7   No results for input(s): LIPASE, AMYLASE in the last 168 hours. No results for input(s): AMMONIA in the last 168 hours. Coagulation Profile: No results for input(s): INR, PROTIME in the last 168 hours. Cardiac Enzymes:  Recent Labs Lab 01/07/17 1407  TROPONINI <0.03   BNP (last 3 results) No results for input(s): PROBNP in the last 8760 hours. HbA1C: No results for input(s): HGBA1C in the last 72 hours. CBG: No results for input(s): GLUCAP in the last 168 hours. Lipid Profile: No results for input(s): CHOL, HDL, LDLCALC, TRIG, CHOLHDL, LDLDIRECT in the last 72 hours. Thyroid Function Tests:  Recent Labs  01/07/17 1422  TSH 1.135   Anemia Panel: No results for input(s): VITAMINB12, FOLATE, FERRITIN, TIBC, IRON, RETICCTPCT in the last 72 hours. Sepsis Labs:  Recent Labs Lab 01/07/17 1422 01/09/17 0406  PROCALCITON <0.10 <0.10    Recent Results (from the past 240 hour(s))  Culture, blood (routine x 2) Call MD if unable to obtain prior to antibiotics being given     Status: None (Preliminary result)   Collection Time: 01/07/17  7:14 PM  Result Value Ref Range Status   Specimen Description BLOOD RIGHT ARM  Final   Special Requests Blood Culture adequate volume  Final   Culture NO GROWTH 2 DAYS  Final   Report Status PENDING  Incomplete  Culture, blood (routine x 2) Call MD if unable to obtain prior to antibiotics being given     Status: None (Preliminary result)   Collection Time: 01/07/17  7:19 PM  Result Value Ref Range Status   Specimen Description BLOOD RIGHT ARM  Final   Special Requests Blood Culture adequate volume  Final   Culture NO GROWTH 2 DAYS  Final   Report Status PENDING  Incomplete  Culture, sputum-assessment     Status: None   Collection Time: 01/08/17 10:00 AM  Result Value Ref Range Status   Specimen Description EXPECTORATED SPUTUM   Final   Special Requests Normal  Final   Sputum evaluation   Final    Sputum specimen not acceptable for testing.  Please recollect.   CALLED TO MCGIBBONY,C AT 1218 BY HUFFINES,S ON 01/08/17.    Report Status 01/09/2017 FINAL  Final         Radiology Studies: No results found.      Scheduled Meds: . amLODipine  5 mg Oral q morning - 10a  . aspirin EC  81 mg Oral q morning - 10a  . atorvastatin  40 mg Oral QHS  . donepezil  5 mg Oral Daily  . doxycycline  100 mg Oral Q12H  . enoxaparin (LOVENOX) injection  40 mg Subcutaneous Q24H  . famotidine  20 mg Oral Daily  . feeding supplement (ENSURE ENLIVE)  237 mL Oral BID BM  .  fluticasone  2 spray Each Nare Daily  . guaiFENesin  1,200 mg Oral BID  . ipratropium-albuterol  3 mL Nebulization Q4H  . levothyroxine  125 mcg Oral q morning - 10a  . loratadine  10 mg Oral Daily  . mometasone-formoterol  2 puff Inhalation BID  . nicotine  14 mg Transdermal Daily  . [START ON 01/10/2017] predniSONE  40 mg Oral Q breakfast  . ranolazine  500 mg Oral BID  . venlafaxine XR  75 mg Oral BH-q7a   Continuous Infusions: . cefTRIAXone (ROCEPHIN)  IV 1 g (01/09/17 1757)     LOS: 2 days    Time spent: 70mins    MEMON,JEHANZEB, MD [facility] Pager [phone]  If 7PM-7AM, please contact night-coverage [email or web address] Password [email or web address] 01/09/2017, 8:10 PM

## 2017-01-09 NOTE — Progress Notes (Signed)
OT Cancellation Note  Patient Details Name: Michael [name] MRN: [id number] DOB: [birth year]   Cancelled Treatment:    Reason Eval/Treat Not Completed: Patient declined, no reason specified;Patient at procedure or test/ unavailable. Attempted OT evaluation this am. Pt refused, daughter reports pt had a rough night and asked if OT could check back after breakfast. At baseline pt has an aide from 9:30 to 1pm, who prepares breakfast and assists with housework. Pt's daughter reports pt does not get up until 12 or 12:30pm when at home, then once dressed goes to kitchen to eat and then to couch where he stays for the majority of the day.   On second attempt, respiratory therapy in room with pt. Will attempt evaluation again at a later time.   Guadelupe Sabin, OTR/L  [phone] 01/09/2017, 8:40 AM

## 2017-01-09 NOTE — Care Management Note (Signed)
Case Management Note  Patient Details  Name: Michael [name] MRN: [id number] Date of Birth: January 21, 1932  Subjective/Objective:                  Pt admitted with pneumonia. He is from home, lives alone, was very ind pta. He was not driving in the recent past. Has 2 daughters who help fix medications weekly and get pt to appointments, do shopping. Pt has cane and RW but was not using them pta. On oxygen acutely. 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 Daughters asking about who to determine if pt is eligible for aid services through [place]. PT has recommended HH PT. Pt agreeable, has chosen [facility] from list of [facility] providers. Pt/dtr aware HH has 48 hrs to make first visit. No other needs or concerns communicated by pt or daughter.   Action/Plan: Vaughan Basta [facility] rep, aware of referral and will obtain pt info from chart. Pt will need to wean from oxygen or have home O2 assessment prior to DC. Pt's daughter given contact info for [place] SW who can determine pt eligibility for aid services or connect him with [facility]. [place] home in ext 24 hrs. CM will cont to follow to monitor for O2 needs.   Expected Discharge Date:    01/10/2017              Expected Discharge Plan:  [place]  In-House Referral:  Hospice / Palliative Care  Discharge planning Services  CM Consult  Post Acute Care Choice:  Home Health Choice offered to:  Patient  HH Arranged:  RN, PT, Nurse's Aide [place] Agency:  [place]  Status of Service:  In process, will continue to follow  Sherald Barge, RN 01/09/2017, 12:41 PM

## 2017-01-10 ENCOUNTER — Inpatient Hospital Stay (HOSPITAL_COMMUNITY): Payer: Medicare Other

## 2017-01-10 DIAGNOSIS — J9601 Acute respiratory failure with hypoxia: Secondary | ICD-10-CM

## 2017-01-10 DIAGNOSIS — N183 Chronic kidney disease, stage 3 unspecified: Secondary | ICD-10-CM

## 2017-01-10 DIAGNOSIS — E038 Other specified hypothyroidism: Secondary | ICD-10-CM

## 2017-01-10 DIAGNOSIS — F17200 Nicotine dependence, unspecified, uncomplicated: Secondary | ICD-10-CM

## 2017-01-10 DIAGNOSIS — I10 Essential (primary) hypertension: Secondary | ICD-10-CM

## 2017-01-10 DIAGNOSIS — R739 Hyperglycemia, unspecified: Secondary | ICD-10-CM

## 2017-01-10 DIAGNOSIS — F0390 Unspecified dementia without behavioral disturbance: Secondary | ICD-10-CM

## 2017-01-10 DIAGNOSIS — J441 Chronic obstructive pulmonary disease with (acute) exacerbation: Principal | ICD-10-CM

## 2017-01-10 LAB — BASIC METABOLIC PANEL
ANION GAP: 7 (ref 5–15)
BUN: 23 mg/dL — ABNORMAL HIGH (ref 6–20)
CALCIUM: 8.3 mg/dL — AB (ref 8.9–10.3)
CO2: 24 mmol/L (ref 22–32)
CREATININE: 0.99 mg/dL (ref 0.61–1.24)
Chloride: 96 mmol/L — ABNORMAL LOW (ref 101–111)
GFR calc Af Amer: >60 mL/min (ref >60)
GFR calc non Af Amer: >60 mL/min (ref >60)
GLUCOSE: 119 mg/dL — AB (ref 65–99)
Potassium: 3.9 mmol/L (ref 3.5–5.1)
Sodium: 127 mmol/L — ABNORMAL LOW (ref 135–145)

## 2017-01-10 LAB — OSMOLALITY, URINE: Osmolality, Ur: 343 mOsm/kg (ref 300–900)

## 2017-01-10 LAB — SODIUM, URINE, RANDOM
SODIUM UR: 75 mmol/L
Sodium, Ur: 70 mmol/L

## 2017-01-10 LAB — OSMOLALITY: OSMOLALITY: 266 mosm/kg — AB (ref 275–295)

## 2017-01-10 LAB — CREATININE, URINE, RANDOM: CREATININE, URINE: 26.23 mg/dL

## 2017-01-10 MED ORDER — SODIUM CHLORIDE 0.9 % IV SOLN
INTRAVENOUS | Status: DC
  Administered 2017-01-10 – 2017-01-11 (×2): via INTRAVENOUS

## 2017-01-10 MED ORDER — IPRATROPIUM-ALBUTEROL 0.5-2.5 (3) MG/3ML IN SOLN
3.0000 mL | Freq: Four times a day (QID) | RESPIRATORY_TRACT | Status: DC
  Administered 2017-01-10 – 2017-01-11 (×3): 3 mL via RESPIRATORY_TRACT
  Filled 2017-01-10 (×3): qty 3

## 2017-01-10 MED ORDER — ACETAMINOPHEN 325 MG PO TABS: 650.0000 mg | ORAL_TABLET | Freq: Four times a day (QID) | ORAL | Status: DC | PRN

## 2017-01-10 MED ORDER — FENTANYL CITRATE (PF) 100 MCG/2ML IJ SOLN: 12.5000 ug | INTRAMUSCULAR | Status: DC | PRN

## 2017-01-10 MED ORDER — BUDESONIDE 0.25 MG/2ML IN SUSP
0.2500 mg | Freq: Two times a day (BID) | RESPIRATORY_TRACT | Status: DC
  Administered 2017-01-10 – 2017-01-11 (×2): 0.25 mg via RESPIRATORY_TRACT
  Filled 2017-01-10 (×2): qty 2

## 2017-01-10 NOTE — Evaluation (Signed)
Occupational Therapy Evaluation Patient Details Name: Michael [name] MRN: [id number] DOB: Feb 19, 1932 Today's Date: 01/10/2017    History of Present Illness Michael [name] is a 83 y.o. male with medical history significant of renal and prostate cancers; hypothyroidism; HTN; HLD; GERD; dementia; COPD; and CAD presenting with progressive weakness.  On 8/29, he went to the doctor because he was gettting weaker, not feeling well.  Diagnosed with PNA after CXR and placed on Levaquin x 5 days.  Completed abx and when the doctor called back to check on him on 9/4, he was still not feeling well, weak, decreased PO, no energy.  Was given a prednisone dose pack and he just finished that today.  He had no response to that medication.  Continuing to get weaker, more unsteady, no appetite.  This AM, his daughter came to check on him about 9 and he was on the floor in the bathroom.  He said he didn't fall but felt weak and sat down.  They got him back into the bed.  He was wheezing loudly, which he has been doing for the last several days.  +hot - ?fevers.  Some cough, occasionally productive of yellowish sputum.  He has a lot of urinary urgency with incontinence at baseline.  Very sedentary, has back pain that he complains is his kidneys but daughters think it might be related to minimal activity.   Clinical Impression   Pt received supine in bed, agreeable to OT evaluation; daughter [name]) in room. Pt completing ADL tasks with supervision to min assist during session, cuing for safety during tasks. Pt requiring min guard for functional mobility during session. At this time pt has aide in the mornings and daughter is with pt in the evenings and are able to assist with ADL tasks as he allows them to. No follow up OT on discharge, will continue to see pt acutely to educate on energy conservation, safety during ADL completion, and strengthening.     Follow Up Recommendations  No OT follow up;Supervision/Assistance -  24 hour    Equipment Recommendations  None recommended by OT       Precautions / Restrictions Precautions Precautions: Fall Restrictions Weight Bearing Restrictions: No      Mobility Bed Mobility Overal bed mobility: Needs Assistance Bed Mobility: Supine to Sit     Supine to sit: Supervision        Transfers Overall transfer level: Needs assistance   Transfers: Sit to/from Stand Sit to Stand: Min assist                  ADL either performed or assessed with clinical judgement   ADL Overall ADL's : Needs assistance/impaired Eating/Feeding: Set up;Sitting   Grooming: Wash/dry hands;Set up;Sitting Grooming Details (indicate cue type and reason): OT provided hand sanitizer while pt seated in chair due to fatigue after toileting         Upper Body Dressing : Minimal assistance;Sitting Upper Body Dressing Details (indicate cue type and reason): Assist for managing gown buttons and ties Lower Body Dressing: Minimal assistance;Sit to/from stand Lower Body Dressing Details (indicate cue type and reason): Pt donned Depends while standing, OT providing min assist for balance and bringing Depends completely over foot Toilet Transfer: Supervision/safety;Ambulation;Regular Toilet;Grab bars Toilet Transfer Details (indicate cue type and reason): Cuing to hold onto grab bars Toileting- Clothing Manipulation and Hygiene: Supervision/safety;Sitting/lateral lean       Functional mobility during ADLs: Min guard;Cueing for safety  Vision Baseline Vision/History: No visual deficits Patient Visual Report: No change from baseline Vision Assessment?: No apparent visual deficits            Pertinent Vitals/Pain Pain Assessment: No/denies pain     Hand Dominance Right   Extremity/Trunk Assessment Upper Extremity Assessment Upper Extremity Assessment: Generalized weakness   Lower Extremity Assessment Lower Extremity Assessment: Defer to PT evaluation    Cervical / Trunk Assessment Cervical / Trunk Assessment: Normal   Communication Communication Communication: No difficulties   Cognition Arousal/Alertness: Awake/alert Behavior During Therapy: WFL for tasks assessed/performed Overall Cognitive Status: Within Functional Limits for tasks assessed                                                Home Living Family/patient expects to be discharged to:: Private residence Living Arrangements: Alone (sitter 9:30-1pm, daughter in evenings for meal) Available Help at Discharge: Family;Personal care attendant;Available PRN/intermittently Type of Home: House Home Access: Stairs to enter [employer] of Steps: 6 Entrance Stairs-Rails: Can reach both Home Layout: One level     Bathroom Shower/Tub: Teacher, early years/pre: Standard     Home Equipment: Environmental consultant - 2 wheels          Prior Functioning/Environment Level of Independence: Needs assistance  Gait / Transfers Assistance Needed: mostly leaning on furniture to walk in house ADL's / Homemaking Assistance Needed: Complete ADLs independently, caregiver/family assists with meals/housework            OT Problem List: Decreased strength;Decreased activity tolerance;Impaired balance (sitting and/or standing)      OT Treatment/Interventions: Self-care/ADL training;Therapeutic exercise;Therapeutic activities;Patient/family education    OT Goals(Current goals can be found in the care plan section) Acute Rehab OT Goals Patient Stated Goal: To go home OT Goal Formulation: With patient Time For Goal Achievement: 01/24/17 Potential to Achieve Goals: Good  OT Frequency: Min 2X/week                 End of Session Equipment Utilized During Treatment: Gait belt  Activity Tolerance: Patient tolerated treatment well Patient left: in chair;with call bell/phone within reach;with family/visitor present  OT Visit Diagnosis: Muscle weakness  (generalized) (M62.81);Unsteadiness on feet (R26.81)                Time: 8756-4332 OT Time Calculation (min): 29 min Charges:  OT General Charges $OT Visit: 1 Visit OT Evaluation $OT Eval Low Complexity: [place], OTR/L  [phone] 01/10/2017, 8:47 AM

## 2017-01-10 NOTE — Progress Notes (Signed)
Physical Therapy Treatment Patient Details Name: Michael [name] MRN: [id number] DOB: 10-Apr-1932 Today's Date: 01/10/2017    History of Present Illness Michael [name] is a 83 y.o. male with medical history significant of renal and prostate cancers; hypothyroidism; HTN; HLD; GERD; dementia; COPD; and CAD presenting with progressive weakness.  On 8/29, he went to the doctor because he was gettting weaker, not feeling well.  Diagnosed with PNA after CXR and placed on Levaquin x 5 days.  Completed abx and when the doctor called back to check on him on 9/4, he was still not feeling well, weak, decreased PO, no energy.  Was given a prednisone dose pack and he just finished that today.  He had no response to that medication.  Continuing to get weaker, more unsteady, no appetite.  This AM, his daughter came to check on him about 9 and he was on the floor in the bathroom.  He said he didn't fall but felt weak and sat down.  They got him back into the bed.  He was wheezing loudly, which he has been doing for the last several days.  +hot - ?fevers.  Some cough, occasionally productive of yellowish sputum.  He has a lot of urinary urgency with incontinence at baseline.  Very sedentary, has back pain that he complains is his kidneys but daughters think it might be related to minimal activity.    PT Comments    Patient demonstrates slightly increased tolerance for gait, limited secondary to mild SOB and O2 sats at 87-88% after gait training with 2 LPM O2.  Patient will benefit from continued physical therapy in hospital and recommended venue below to increase strength, balance, endurance for safe ADLs and gait.   Follow Up Recommendations  Home health PT;Supervision/Assistance - 24 hour     Equipment Recommendations  Rolling walker with 5" wheels    Recommendations for Other Services       Precautions / Restrictions Precautions Precautions: Fall Precaution Comments: On 2 LPM during  treatment Restrictions Weight Bearing Restrictions: No    Mobility  Bed Mobility               General bed mobility comments: Patient presents up in chair with family member present at bedside.  Transfers Overall transfer level: Needs assistance Equipment used: Rolling walker (2 wheeled) Transfers: Sit to/from Stand Sit to Stand: Min assist Stand pivot transfers: Min assist          Ambulation/Gait Ambulation/Gait assistance: Min guard Ambulation Distance (Feet): 45 Feet Assistive device: Rolling walker (2 wheeled) Gait Pattern/deviations: Decreased step length - right;Decreased step length - left;Decreased stride length   Gait velocity interpretation: Below normal speed for age/gender General Gait Details: Demonstrates slighlty increased tolerance for gait, limited secondary to c/o fatigue   Stairs            Wheelchair Mobility    Modified Rankin (Stroke Patients Only)       Balance Overall balance assessment: Needs assistance Sitting-balance support: Feet supported;No upper extremity supported Sitting balance-Leahy Scale: Good     Standing balance support: Bilateral upper extremity supported;During functional activity Standing balance-Leahy Scale: Fair                              Cognition Arousal/Alertness: Awake/alert Behavior During Therapy: WFL for tasks assessed/performed Overall Cognitive Status: Within Functional Limits for tasks assessed  Exercises General Exercises - Lower Extremity Ankle Circles/Pumps: AROM;Seated;Both;10 reps Long Arc Quad: Seated;AROM;Both;10 reps Hip Flexion/Marching: Seated;AROM;Both;10 reps Toe Raises: Seated;AROM;Both;10 reps Heel Raises: Seated;AROM;Both;10 reps    General Comments        Pertinent Vitals/Pain Pain Assessment: No/denies pain    Home Living                      Prior Function            PT Goals  (current goals can now be found in the care plan section) Acute Rehab PT Goals Patient Stated Goal: To go home PT Goal Formulation: With patient/family Potential to Achieve Goals: Good Progress towards PT goals: Progressing toward goals    Frequency    Min 3X/week      PT Plan Current plan remains appropriate    Co-evaluation              AM-PAC PT "6 Clicks" Daily Activity  Outcome Measure  Difficulty turning over in bed (including adjusting bedclothes, sheets and blankets)?: None Difficulty moving from lying on back to sitting on the side of the bed? : None Difficulty sitting down on and standing up from a chair with arms (e.g., wheelchair, bedside commode, etc,.)?: None Help needed moving to and from a bed to chair (including a wheelchair)?: A Little Help needed walking in hospital room?: A Little Help needed climbing 3-5 steps with a railing? : A Lot 6 Click Score: 20    End of Session Equipment Utilized During Treatment: Gait belt;Oxygen Activity Tolerance: Patient tolerated treatment well;Patient limited by fatigue Patient left: in chair;with call bell/phone within reach;with family/visitor present Nurse Communication: Mobility status PT Visit Diagnosis: Unsteadiness on feet (R26.81);Other abnormalities of gait and mobility (R26.89);Muscle weakness (generalized) (M62.81)     Time: 2878-6767 PT Time Calculation (min) (ACUTE ONLY): 22 min  Charges:  $Gait Training: 8-22 mins                    G Codes:       2:02 PM, January 29, 2017 Lonell Grandchild, MPT Physical Therapist with [facility] [phone] office [phone] mobile phone

## 2017-01-10 NOTE — Progress Notes (Signed)
PROGRESS NOTE  Michael [name] IRC:[id number] DOB: [birth year] DOA: 01/07/2017 PCP: Curlene Labrum, MD  Brief History:  83 year old male with a history of renal cell carcinoma and prostate cancer, hypertension, dementia, COPD, coronary artery disease, depression presenting with progressive weakness. The patient was seen by his primary care provider on 12/27/2016 and was diagnosed with pneumonia. The patient was placed on levofloxacin 5 days. Upon follow-up, the patient continued to have generalized weakness. He was placed on prednisone without improvement. On the morning of 01/07/2017, the patient was found on the floor. Bathroom by his daughter. Apparently, the patient was too weak to get up. At that time, the patient was wheezing and had shortness of breath and coughing productive yellow sputum. The patient was brought to the emergency department. Initial chest x-ray showed persistent peripheral right midlung opacity. He was started on ceftriaxone and doxycycline. He was also started on intravenous IV Medrol.  Assessment/Plan: Acute respiratory failure with hypoxia -Secondary COPD exacerbation -Improved clinically on IV Solu-Medrol--> De-escalated to po prednisone -finish 5 days abx -continue Duonebs -Presently stable on 1.5 L -Patient will need ambulatory pulse ox prior to discharge  COPD exacerbation -Continue prednisone -Start Pulmicort -Continue bronchodilators  Hyponatremia -The patient has had a degree of mild hyponatremia in the past ranging 130-134 -Likely due to poor solute intake -Urine osmolarity -Serum osmolarity -Urine sodium and urine creatinine -am cortisol -am lipid panel  RLL/RML opacity -present since May 2017 -CT chest  CKD stage 3 -Baseline creatinine 1.0-1.2 -A.m. BMP  Essential hypertension -Continue amlodipine  Coronary artery disease -No anginal symptoms -Personally reviewed EKG--sinus rhythm, nonspecific ST changes -07/02/2015 heart  cath--nonobstructive disease in LAD and circumflex; patent stents with mild in-stent restenosis -Continue aspirin -continue ranexa  Hypothyroidism -TSH 1.135 -Continue Synthroid  Dementia without behavioral disturbance -Continue Aricept  Hyperglycemia -due to steroids -check A1C  Tobacco abuse -cessation discussed  GOC -palliative care saw pt -DNR    Disposition Plan:   Home in 1-2 days  Family Communication:   Daughter updated at bedside 9/12  Total time spent 35 minutes.  Greater than 50% spent face to face counseling and coordinating care.  Consultants:  none  Code Status:  FULL / DNR  DVT Prophylaxis:  [place] Heparin / [place] Lovenox   Procedures: As Listed in Progress Note Above  Antibiotics: Doxy 9/9>>> Ceftriaxone 9/9>>    Subjective: Patient denies fevers, chills, headache, chest pain, dyspnea, nausea, vomiting, diarrhea, abdominal pain, dysuria, hematuria, hematochezia, and melena.   Objective: Vitals:   01/09/17 2300 01/10/17 0534 01/10/17 0728 01/10/17 1521  BP:  (!) 154/73    Pulse:      Resp:      Temp:  98.5 F (36.9 C)    TempSrc:      SpO2: 92% 96% 94% 92%  Weight:      Height:        Intake/Output Summary (Last 24 hours) at 01/10/17 1719 Last data filed at 01/10/17 0900  Gross per 24 hour  Intake              605 ml  Output              375 ml  Net              230 ml   Weight change:  Exam:   General:  Pt is alert, follows commands appropriately, not in acute distress  HEENT: No icterus, No thrush, No  neck mass, [place]/AT  Cardiovascular: RRR, S1/S2, no rubs, no gallops  Respiratory: Diminished breath sounds with bibasilar crackles. No wheezing. Good air movement  Abdomen: Soft/+BS, non tender, non distended, no guarding  Extremities: No edema, No lymphangitis, No petechiae, No rashes, no synovitis   Data Reviewed: I have personally reviewed following labs and imaging studies Basic Metabolic Panel:  Recent Labs Lab  01/07/17 1407 01/08/17 0554 01/09/17 0406 01/10/17 0645  NA 130* 129* 127* 127*  K 3.6 3.7 3.8 3.9  CL 93* 97* 96* 96*  CO2 26 23 25 24   GLUCOSE 124* 155* 120* 119*  BUN 21* 23* 30* 23*  CREATININE 1.34* 1.22 1.06 0.99  CALCIUM 9.4 8.3* 8.1* 8.3*   Liver Function Tests:  Recent Labs Lab 01/07/17 1407  AST 40  ALT 21  ALKPHOS 74  BILITOT 0.9  PROT 6.5  ALBUMIN 3.7   No results for input(s): LIPASE, AMYLASE in the last 168 hours. No results for input(s): AMMONIA in the last 168 hours. Coagulation Profile: No results for input(s): INR, PROTIME in the last 168 hours. CBC:  Recent Labs Lab 01/07/17 1407 01/08/17 0554  WBC 20.6* 21.0*  NEUTROABS 18.1* 20.1*  HGB 12.9* 10.8*  HCT 36.8* 30.8*  MCV 95.3 95.1  PLT 335 319   Cardiac Enzymes:  Recent Labs Lab 01/07/17 1407  TROPONINI <0.03   BNP: Invalid input(s): POCBNP CBG: No results for input(s): GLUCAP in the last 168 hours. HbA1C: No results for input(s): HGBA1C in the last 72 hours. Urine analysis:    Component Value Date/Time   COLORURINE YELLOW 03/01/2016 1334   APPEARANCEUR CLEAR 03/01/2016 1334   LABSPEC 1.010 03/01/2016 1334   PHURINE 6.5 03/01/2016 1334   GLUCOSEU NEGATIVE 03/01/2016 1334   HGBUR NEGATIVE 03/01/2016 1334   BILIRUBINUR NEGATIVE 03/01/2016 1334   KETONESUR NEGATIVE 03/01/2016 1334   PROTEINUR NEGATIVE 03/01/2016 1334   UROBILINOGEN 0.2 07/30/2013 1120   NITRITE NEGATIVE 03/01/2016 1334   LEUKOCYTESUR NEGATIVE 03/01/2016 1334   Sepsis Labs: @LABRCNTIP (procalcitonin:4,lacticidven:4) ) Recent Results (from the past 240 hour(s))  Culture, blood (routine x 2) Call MD if unable to obtain prior to antibiotics being given     Status: None (Preliminary result)   Collection Time: 01/07/17  7:14 PM  Result Value Ref Range Status   Specimen Description BLOOD RIGHT ARM  Final   Special Requests Blood Culture adequate volume  Final   Culture NO GROWTH 3 DAYS  Final   Report Status  PENDING  Incomplete  Culture, blood (routine x 2) Call MD if unable to obtain prior to antibiotics being given     Status: None (Preliminary result)   Collection Time: 01/07/17  7:19 PM  Result Value Ref Range Status   Specimen Description BLOOD RIGHT ARM  Final   Special Requests Blood Culture adequate volume  Final   Culture NO GROWTH 3 DAYS  Final   Report Status PENDING  Incomplete  Culture, sputum-assessment     Status: None   Collection Time: 01/08/17 10:00 AM  Result Value Ref Range Status   Specimen Description EXPECTORATED SPUTUM  Final   Special Requests Normal  Final   Sputum evaluation   Final    Sputum specimen not acceptable for testing.  Please recollect.   CALLED TO MCGIBBONY,C AT 1218 BY HUFFINES,S ON 01/08/17.    Report Status 01/09/2017 FINAL  Final     Scheduled Meds: . amLODipine  5 mg Oral q morning - 10a  . aspirin EC  81 mg Oral q morning - 10a  . atorvastatin  40 mg Oral QHS  . donepezil  5 mg Oral Daily  . doxycycline  100 mg Oral Q12H  . enoxaparin (LOVENOX) injection  40 mg Subcutaneous Q24H  . famotidine  20 mg Oral Daily  . feeding supplement (ENSURE ENLIVE)  237 mL Oral BID BM  . fluticasone  2 spray Each Nare Daily  . guaiFENesin  1,200 mg Oral BID  . ipratropium-albuterol  3 mL Nebulization QID  . levothyroxine  125 mcg Oral q morning - 10a  . loratadine  10 mg Oral Daily  . mometasone-formoterol  2 puff Inhalation BID  . nicotine  14 mg Transdermal Daily  . predniSONE  40 mg Oral Q breakfast  . ranolazine  500 mg Oral BID  . venlafaxine XR  75 mg Oral BH-q7a   Continuous Infusions: . cefTRIAXone (ROCEPHIN)  IV Stopped (01/09/17 1900)    Procedures/Studies: Dg Chest Port 1 View  Result Date: 01/07/2017 CLINICAL DATA:  Patient with cough.  COPD. EXAM: PORTABLE CHEST 1 VIEW COMPARISON:  Chest radiograph 12/27/2016 FINDINGS: Monitoring leads overlie the patient. Patient is mildly rotated. Stable enlarged cardiac and mediastinal contours.  Persistent peripheral consolidative opacity right mid lung. Bibasilar atelectasis. No pleural effusion or pneumothorax. IMPRESSION: Persistent peripheral opacity within the right mid lung which may represent pneumonia in the appropriate clinical setting. Followup PA and lateral chest X-ray is recommended in 3-4 weeks following trial of antibiotic therapy to ensure resolution and exclude underlying malignancy. Electronically Signed   By: Lovey Newcomer M.D.   On: 01/07/2017 14:35    Zamiya Dillard, DO  [facility] Pager [phone]  If 7PM-7AM, please contact night-coverage [email or web address] Password [email or web address] 01/10/2017, 5:19 PM   LOS: 3 days

## 2017-01-10 NOTE — Progress Notes (Signed)
Patient's son states that patient has had some increased confusion through the night. Patient has frequently started to get up to urinate. Son starts to help patient until staff arrives. Bed alarm is on. Son still at bedside. Will continue to monitor.

## 2017-01-11 LAB — CORTISOL-AM, BLOOD: CORTISOL - AM: 2.8 ug/dL — AB (ref 6.7–22.6)

## 2017-01-11 LAB — LIPID PANEL
CHOL/HDL RATIO: 2.1 ratio
CHOLESTEROL: 117 mg/dL (ref 0–200)
HDL: 57 mg/dL (ref >40)
LDL Cholesterol: 47 mg/dL (ref 0–99)
Triglycerides: 66 mg/dL (ref <150)
VLDL: 13 mg/dL (ref 0–40)

## 2017-01-11 LAB — BASIC METABOLIC PANEL
ANION GAP: 6 (ref 5–15)
BUN: 19 mg/dL (ref 6–20)
CHLORIDE: 96 mmol/L — AB (ref 101–111)
CO2: 26 mmol/L (ref 22–32)
Calcium: 8.1 mg/dL — ABNORMAL LOW (ref 8.9–10.3)
Creatinine, Ser: 0.95 mg/dL (ref 0.61–1.24)
GFR calc Af Amer: >60 mL/min (ref >60)
GLUCOSE: 97 mg/dL (ref 65–99)
POTASSIUM: 4.2 mmol/L (ref 3.5–5.1)
Sodium: 128 mmol/L — ABNORMAL LOW (ref 135–145)

## 2017-01-11 LAB — CBC
HCT: 33.1 % — ABNORMAL LOW (ref 39.0–52.0)
Hemoglobin: 11.9 g/dL — ABNORMAL LOW (ref 13.0–17.0)
MCH: 33.8 pg (ref 26.0–34.0)
MCHC: 36 g/dL (ref 30.0–36.0)
MCV: 94 fL (ref 78.0–100.0)
PLATELETS: 239 10*3/uL (ref 150–400)
RBC: 3.52 MIL/uL — AB (ref 4.22–5.81)
RDW: 14.5 % (ref 11.5–15.5)
WBC: 19.9 10*3/uL — ABNORMAL HIGH (ref 4.0–10.5)

## 2017-01-11 LAB — PROCALCITONIN: Procalcitonin: <0.1 ng/mL

## 2017-01-11 LAB — MAGNESIUM: Magnesium: 1.7 mg/dL (ref 1.7–2.4)

## 2017-01-11 LAB — HEMOGLOBIN A1C
Hgb A1c MFr Bld: 5.2 % (ref 4.8–5.6)
MEAN PLASMA GLUCOSE: 102.54 mg/dL

## 2017-01-11 MED ORDER — DOXYCYCLINE HYCLATE 100 MG PO TABS: 100.0000 mg | ORAL_TABLET | Freq: Two times a day (BID) | ORAL | 0 refills | Status: DC

## 2017-01-11 MED ORDER — IPRATROPIUM-ALBUTEROL 0.5-2.5 (3) MG/3ML IN SOLN
3.0000 mL | Freq: Three times a day (TID) | RESPIRATORY_TRACT | Status: DC
  Administered 2017-01-11: 3 mL via RESPIRATORY_TRACT
  Filled 2017-01-11: qty 3

## 2017-01-11 MED ORDER — PREDNISONE 20 MG PO TABS: 40.0000 mg | ORAL_TABLET | Freq: Every day | ORAL | 0 refills | Status: DC

## 2017-01-11 NOTE — Care Management Note (Signed)
Case Management Note  Patient Details  Name: Michael [name] MRN: [id number] Date of Birth: 07/05/1931  Expected Discharge Date:  01/11/17               Expected Discharge Plan:  [place]  In-House Referral:  Hospice / Palliative Care  Discharge planning Services  CM Consult  Post Acute Care Choice:  Home Health Choice offered to:  Patient  DME Arranged:  Oxygen DME Agency:  [place] Arranged:  RN, PT [facility] Agency:  [place]  Status of Service:  Completed, signed off  If discussed at [place] of Stay Meetings, dates discussed: 01/11/2017  Additional Comments: Pt will DC home today. He needs oxygen and 3in1. Pt's daughter as requested [employer] for oxygen and [facility] for 3in1. Pt agreeable. Gilda, [employer] rep, given referral, info pulled from Epic and port oxygen tank will be delivered to pt room prior to DC. Brad, [facility] rep, aware of [place] and DME referral, has pulled info from Epic and delivered 3in1 to pt room prior to DC. Pt aware HH has 48 hrs to make first visit   Sherald Barge, RN 01/11/2017, 2:09 PM

## 2017-01-11 NOTE — Discharge Summary (Signed)
Physician Discharge Summary  JAHMEEK SHIRK ZOX:[id number] DOB: 07-May-1931 DOA: 01/07/2017  PCP: Curlene Labrum, MD  Admit date: 01/07/2017 Discharge date: 01/11/2017  Admitted From:Home Disposition:  Home   Recommendations for Outpatient Follow-up:  1. Follow up with PCP in 1-2 weeks 2. Please obtain BMP/CBC in one week   Home Health:yes Equipment/Devices:HHPT  Discharge Condition: Stable CODE STATUS:DNR Diet recommendation: Heart Healthy    Brief/Interim Summary: 83 year old male with a history of renal cell carcinoma and prostate cancer, hypertension, dementia, COPD, coronary artery disease, depression presenting with progressive weakness. The patient was seen by his primary care provider on 12/27/2016 and was diagnosed with pneumonia. The patient was placed on levofloxacin 5 days. Upon follow-up, the patient continued to have generalized weakness. He was placed on prednisone without improvement. On the morning of 01/07/2017, the patient was found on the floor. Bathroom by his daughter. Apparently, the patient was too weak to get up. At that time, the patient was wheezing and had shortness of breath and coughing productive yellow sputum. The patient was brought to the emergency department. Initial chest x-ray showed persistent peripheral right midlung opacity. He was started on ceftriaxone and doxycycline. He was also started on intravenous IV Solu-Medrol with clinical improvement.  Discharge Diagnoses:  Acute respiratory failure with hypoxia -Secondary COPD exacerbation -Improved clinically on IV Solu-Medrol--> De-escalated to po prednisone -finish 5 days abx--one more day after dc -continue Duonebs -Presently stable on 1.5 L-->weaned to RA -ambulatory pulseox showed desat to 86%-->home with 2L  COPD exacerbation -Continue prednisone--home with prednisone 40 mg daily x 2 more days -Started Pulmicort during hospitalization -Continue bronchodilators  Hyponatremia -The  patient has had a degree of mild hyponatremia in the past ranging 130-134 -Likely due to poor solute intake and SIADH -Na remained stable throughout hospitalization 127-130 -Urine osmolarity--343 -Serum osmolarity--266 -Urine sodium and urine creatinine--FeNa--2.08% -am cortisol--pending, but likely will be spurious in setting of prednisone -am lipid panel--LDL 47, triglycerides 66  RLL/RML opacity -present since May 2017 on CXR -CT chest--2.4 x 3.1 cm spiculated mass right upper lobe with adjacent satellite nodule. 12 mm right hilar lymphadenopathy -Findings were discussed with the patient's daughter at the bedside--she does not want any aggressive intervention at this time -Discussed the possibility of obtaining PET scan in outpt setting if family desires  Hematochezia -likely diverticular vs hemorrhoidal bleed -pt had one episode during the hospitalization -he remained hemodynamically stable  -subsequent BMs did not show any blood -daughter did not want any aggressive intervention -drop in Hgb likely dilutional  CKD stage 3 -Baseline creatinine 1.0-1.2 -A.m. BMP  Essential hypertension -Continue amlodipine  Coronary artery disease -No anginal symptoms -Personally reviewed EKG--sinus rhythm, nonspecific ST changes -07/02/2015 heart cath--nonobstructive disease in LAD and circumflex; patent stents with mild in-stent restenosis -Continue aspirin -continue ranexa  Hypothyroidism -TSH 1.135 -Continue Synthroid  Dementia without behavioral disturbance -Continue Aricept  Hyperglycemia -due to steroids -check A1C--pending  Tobacco abuse -cessation discussed  GOC -palliative care saw pt -DNR   Discharge Instructions  Discharge Instructions    Diet - low sodium heart healthy    Complete by:  As directed    Increase activity slowly    Complete by:  As directed      Allergies as of 01/11/2017      Reactions   Hydrocodone-acetaminophen Rash, Other (See  Comments)   Confusion   Oxycodone Hives   Penicillins Hives   Has patient had a PCN reaction causing immediate rash, facial/tongue/throat swelling, SOB or lightheadedness  with hypotension:  no Has patient had a PCN reaction causing severe rash involving mucus membranes or skin necrosis: yes hives Has patient had a PCN reaction that required hospitalization no Has patient had a PCN reaction occurring within the last 10 years: Yes If all of the above answers are "NO", then may proceed with Cephalo   Lyrica [pregabalin] Other (See Comments)   Morphine And Related Other (See Comments)   Ciprofloxacin Nausea Only   Iron Itching      Medication List    TAKE these medications   ADVAIR DISKUS 250-50 MCG/DOSE Aepb Generic drug:  Fluticasone-Salmeterol Inhale 1 puff into the lungs every 12 (twelve) hours.   amLODipine 5 MG tablet Commonly known as:  NORVASC TAKE 1 TABLET EVERY MORNING   aspirin 81 MG tablet Take 81 mg by mouth every morning.   atorvastatin 40 MG tablet Commonly known as:  LIPITOR Take 40 mg by mouth at bedtime.   cetirizine 10 MG tablet Commonly known as:  ZYRTEC Take 10 mg by mouth every morning.   donepezil 5 MG tablet Commonly known as:  ARICEPT Take 5 mg by mouth daily.   doxycycline 100 MG tablet Commonly known as:  VIBRA-TABS Take 1 tablet (100 mg total) by mouth every 12 (twelve) hours.   ferrous sulfate 325 (65 FE) MG tablet Take 325 mg by mouth at bedtime.   levothyroxine 125 MCG tablet Commonly known as:  SYNTHROID, LEVOTHROID Take 125 mcg by mouth every morning.   multivitamin tablet Take 1 tablet by mouth every morning.   NASONEX 50 MCG/ACT nasal spray Generic drug:  mometasone Place 2 sprays into the nose at bedtime.   nitroGLYCERIN 0.4 MG SL tablet Commonly known as:  NITROSTAT Place 1 tablet (0.4 mg total) under the tongue every 5 (five) minutes as needed. For chest pain   predniSONE 20 MG tablet Commonly known as:  DELTASONE Take  2 tablets (40 mg total) by mouth daily with breakfast.   PROAIR HFA 108 (90 Base) MCG/ACT inhaler Generic drug:  albuterol Inhale 2 puffs into the lungs every 6 (six) hours as needed. For shortness of breath   ranitidine 150 MG tablet Commonly known as:  ZANTAC Take 150 mg by mouth every morning.   ranolazine 500 MG 12 hr tablet Commonly known as:  RANEXA Take 1 tablet (500 mg total) by mouth 2 (two) times daily.   traZODone 100 MG tablet Commonly known as:  DESYREL Take 100 mg by mouth at bedtime as needed. For sleep   venlafaxine XR 75 MG 24 hr capsule Commonly known as:  EFFEXOR-XR Take 75 mg by mouth every morning.            Durable Medical Equipment        Start     Ordered   01/11/17 1114  For home use only DME oxygen  Once    Question Answer Comment  Mode or (Route) Nasal cannula   Liters per Minute 2   Frequency Continuous (stationary and portable oxygen unit needed)   Oxygen delivery system Gas      01/11/17 1113   01/11/17 1113  For home use only DME 3 n 1  Once     01/11/17 1113       Discharge Care Instructions        Start     Ordered   01/12/17 0000  predniSONE (DELTASONE) 20 MG tablet  Daily with breakfast     01/11/17 1105   01/11/17  0000  doxycycline (VIBRA-TABS) 100 MG tablet  Every 12 hours     01/11/17 1105   01/11/17 0000  Increase activity slowly     01/11/17 1105   01/11/17 0000  Diet - low sodium heart healthy     01/11/17 1105     Follow-up Information    [facility] Follow up.   Contact information: [place] [address] [phone]          Allergies  Allergen Reactions  . Hydrocodone-Acetaminophen Rash and Other (See Comments)    Confusion  . Oxycodone Hives  . Penicillins Hives    Has patient had a PCN reaction causing immediate rash, facial/tongue/throat swelling, SOB or lightheadedness with hypotension:  no Has patient had a PCN reaction causing severe rash involving  mucus membranes or skin necrosis: yes hives Has patient had a PCN reaction that required hospitalization no Has patient had a PCN reaction occurring within the last 10 years: Yes If all of the above answers are "NO", then may proceed with Cephalo  . Lyrica [Pregabalin] Other (See Comments)  . Morphine And Related Other (See Comments)  . Ciprofloxacin Nausea Only  . Iron Itching    Consultations:  none   Procedures/Studies: Ct Chest Wo Contrast  Result Date: 01/11/2017 CLINICAL DATA:  Shortness of breath. EXAM: CT CHEST WITHOUT CONTRAST TECHNIQUE: Multidetector CT imaging of the chest was performed following the standard protocol without IV contrast. COMPARISON:  None. FINDINGS: Cardiovascular: Heart size normal. Coronary artery calcification is evident. Atherosclerotic calcification is noted in the wall of the thoracic aorta. Mediastinum/Nodes: 12 mm right hilar lymph nodes seen image 79 series 2. No mediastinal lymphadenopathy no evidence for hilar lymphadenopathy although lack of intravenous contrast lessens sensitivity. The esophagus has normal imaging features. There is no axillary lymphadenopathy. Lungs/Pleura: 2.4 x 3.1 cm spiculated mass identified in the peripheral right upper lobe (image 72 series 4) with a tiny adjacent satellite nodule. Underlying changes of emphysema. Left upper lobe interstitial and patchy areas of ground-glass attenuation evident. There is left lower lobe collapse/consolidation with small left pleural effusion. Similar collapse/ consolidative change identified posterior costophrenic sulcus on the right with tiny effusion. Upper Abdomen: Tiny low-density lesion posterior right hepatic dome probably a cyst. 2.2 cm left adrenal nodule compatible with adenoma. 4.0 cm exophytic cyst identified upper pole right kidney Musculoskeletal: Marked degenerative changes noted in each shoulder. Bone windows reveal no worrisome lytic or sclerotic osseous lesions. IMPRESSION: 1. 3.1  cm spiculated mass peripheral right upper lobe is highly suspicious for primary bronchogenic neoplasm. PET-CT recommended to further evaluate. 2. Mild right hilar lymphadenopathy. 3. Interstitial and patchy ground-glass attenuation in the left lung, nonspecific. This may be related to infection/inflammation. Attention on follow-up suggested. 4.  Emphysema. (ICD10-J43.9) 5. Coronary artery and Aortic Atherosclerois (ICD10-170.0) 6. 2.2 cm left adrenal nodule likely adenoma. These results will be called to the ordering clinician or representative by the Radiologist Assistant, and communication documented in the PACS or zVision Dashboard. Electronically Signed   By: Misty Stanley M.D.   On: 01/11/2017 09:45   Dg Chest Port 1 View  Result Date: 01/07/2017 CLINICAL DATA:  Patient with cough.  COPD. EXAM: PORTABLE CHEST 1 VIEW COMPARISON:  Chest radiograph 12/27/2016 FINDINGS: Monitoring leads overlie the patient. Patient is mildly rotated. Stable enlarged cardiac and mediastinal contours. Persistent peripheral consolidative opacity right mid lung. Bibasilar atelectasis. No pleural effusion or pneumothorax. IMPRESSION: Persistent peripheral opacity within the right mid lung which may  represent pneumonia in the appropriate clinical setting. Followup PA and lateral chest X-ray is recommended in 3-4 weeks following trial of antibiotic therapy to ensure resolution and exclude underlying malignancy. Electronically Signed   By: Lovey Newcomer M.D.   On: 01/07/2017 14:35        Discharge Exam: Vitals:   01/11/17 0805 01/11/17 0814  BP:    Pulse:    Resp:    Temp:    SpO2: 92% 92%   Vitals:   01/11/17 0623 01/11/17 0801 01/11/17 0805 01/11/17 0814  BP: (!) 131/48     Pulse: 73     Resp:      Temp: 97.7 F (36.5 C)     TempSrc: Oral     SpO2: 96% 92% 92% 92%  Weight:      Height:        General: Pt is alert, awake, not in acute distress Cardiovascular: RRR, S1/S2 +, no rubs, no gallops Respiratory:  CTA bilaterally, no wheezing, no rhonchi Abdominal: Soft, NT, ND, bowel sounds + Extremities: no edema, no cyanosis   The results of significant diagnostics from this hospitalization (including imaging, microbiology, ancillary and laboratory) are listed below for reference.    Significant Diagnostic Studies: Ct Chest Wo Contrast  Result Date: 01/11/2017 CLINICAL DATA:  Shortness of breath. EXAM: CT CHEST WITHOUT CONTRAST TECHNIQUE: Multidetector CT imaging of the chest was performed following the standard protocol without IV contrast. COMPARISON:  None. FINDINGS: Cardiovascular: Heart size normal. Coronary artery calcification is evident. Atherosclerotic calcification is noted in the wall of the thoracic aorta. Mediastinum/Nodes: 12 mm right hilar lymph nodes seen image 79 series 2. No mediastinal lymphadenopathy no evidence for hilar lymphadenopathy although lack of intravenous contrast lessens sensitivity. The esophagus has normal imaging features. There is no axillary lymphadenopathy. Lungs/Pleura: 2.4 x 3.1 cm spiculated mass identified in the peripheral right upper lobe (image 72 series 4) with a tiny adjacent satellite nodule. Underlying changes of emphysema. Left upper lobe interstitial and patchy areas of ground-glass attenuation evident. There is left lower lobe collapse/consolidation with small left pleural effusion. Similar collapse/ consolidative change identified posterior costophrenic sulcus on the right with tiny effusion. Upper Abdomen: Tiny low-density lesion posterior right hepatic dome probably a cyst. 2.2 cm left adrenal nodule compatible with adenoma. 4.0 cm exophytic cyst identified upper pole right kidney Musculoskeletal: Marked degenerative changes noted in each shoulder. Bone windows reveal no worrisome lytic or sclerotic osseous lesions. IMPRESSION: 1. 3.1 cm spiculated mass peripheral right upper lobe is highly suspicious for primary bronchogenic neoplasm. PET-CT recommended to  further evaluate. 2. Mild right hilar lymphadenopathy. 3. Interstitial and patchy ground-glass attenuation in the left lung, nonspecific. This may be related to infection/inflammation. Attention on follow-up suggested. 4.  Emphysema. (ICD10-J43.9) 5. Coronary artery and Aortic Atherosclerois (ICD10-170.0) 6. 2.2 cm left adrenal nodule likely adenoma. These results will be called to the ordering clinician or representative by the Radiologist Assistant, and communication documented in the PACS or zVision Dashboard. Electronically Signed   By: Misty Stanley M.D.   On: 01/11/2017 09:45   Dg Chest Port 1 View  Result Date: 01/07/2017 CLINICAL DATA:  Patient with cough.  COPD. EXAM: PORTABLE CHEST 1 VIEW COMPARISON:  Chest radiograph 12/27/2016 FINDINGS: Monitoring leads overlie the patient. Patient is mildly rotated. Stable enlarged cardiac and mediastinal contours. Persistent peripheral consolidative opacity right mid lung. Bibasilar atelectasis. No pleural effusion or pneumothorax. IMPRESSION: Persistent peripheral opacity within the right mid lung which may represent pneumonia in the  appropriate clinical setting. Followup PA and lateral chest X-ray is recommended in 3-4 weeks following trial of antibiotic therapy to ensure resolution and exclude underlying malignancy. Electronically Signed   By: Lovey Newcomer M.D.   On: 01/07/2017 14:35     Microbiology: Recent Results (from the past 240 hour(s))  Culture, blood (routine x 2) Call MD if unable to obtain prior to antibiotics being given     Status: None (Preliminary result)   Collection Time: 01/07/17  7:14 PM  Result Value Ref Range Status   Specimen Description BLOOD RIGHT ARM  Final   Special Requests Blood Culture adequate volume  Final   Culture NO GROWTH 4 DAYS  Final   Report Status PENDING  Incomplete  Culture, blood (routine x 2) Call MD if unable to obtain prior to antibiotics being given     Status: None (Preliminary result)   Collection Time:  01/07/17  7:19 PM  Result Value Ref Range Status   Specimen Description BLOOD RIGHT ARM  Final   Special Requests Blood Culture adequate volume  Final   Culture NO GROWTH 4 DAYS  Final   Report Status PENDING  Incomplete  Culture, sputum-assessment     Status: None   Collection Time: 01/08/17 10:00 AM  Result Value Ref Range Status   Specimen Description EXPECTORATED SPUTUM  Final   Special Requests Normal  Final   Sputum evaluation   Final    Sputum specimen not acceptable for testing.  Please recollect.   CALLED TO MCGIBBONY,C AT 1218 BY HUFFINES,S ON 01/08/17.    Report Status 01/09/2017 FINAL  Final     Labs: Basic Metabolic Panel:  Recent Labs Lab 01/07/17 1407 01/08/17 0554 01/09/17 0406 01/10/17 0645 01/11/17 0559  NA 130* 129* 127* 127* 128*  K 3.6 3.7 3.8 3.9 4.2  CL 93* 97* 96* 96* 96*  CO2 26 23 25 24 26   GLUCOSE 124* 155* 120* 119* 97  BUN 21* 23* 30* 23* 19  CREATININE 1.34* 1.22 1.06 0.99 0.95  CALCIUM 9.4 8.3* 8.1* 8.3* 8.1*  MG  --   --   --   --  1.7   Liver Function Tests:  Recent Labs Lab 01/07/17 1407  AST 40  ALT 21  ALKPHOS 74  BILITOT 0.9  PROT 6.5  ALBUMIN 3.7   No results for input(s): LIPASE, AMYLASE in the last 168 hours. No results for input(s): AMMONIA in the last 168 hours. CBC:  Recent Labs Lab 01/07/17 1407 01/08/17 0554  WBC 20.6* 21.0*  NEUTROABS 18.1* 20.1*  HGB 12.9* 10.8*  HCT 36.8* 30.8*  MCV 95.3 95.1  PLT 335 319   Cardiac Enzymes:  Recent Labs Lab 01/07/17 1407  TROPONINI <0.03   BNP: Invalid input(s): POCBNP CBG: No results for input(s): GLUCAP in the last 168 hours.  Time coordinating discharge:  Greater than 30 minutes  Signed:  Andrews Tener, DO [facility] Pager: [phone] 01/11/2017, 11:17 AM

## 2017-01-11 NOTE — Care Management Important Message (Signed)
Important Message  Patient Details  Name: Michael [name] MRN: [id number] Date of Birth: 1931-09-14   Medicare Important Message Given:  Yes    Sherald Barge, RN 01/11/2017, 2:09 PM

## 2017-01-11 NOTE — Progress Notes (Signed)
Occupational Therapy Treatment Patient Details Name: Michael [name] MRN: [id number] DOB: 1932-03-20 Today's Date: 01/11/2017    History of present illness Michael [name] is a 83 y.o. male with medical history significant of renal and prostate cancers; hypothyroidism; HTN; HLD; GERD; dementia; COPD; and CAD presenting with progressive weakness.  On 8/29, he went to the doctor because he was gettting weaker, not feeling well.  Diagnosed with PNA after CXR and placed on Levaquin x 5 days.  Completed abx and when the doctor called back to check on him on 9/4, he was still not feeling well, weak, decreased PO, no energy.  Was given a prednisone dose pack and he just finished that today.  He had no response to that medication.  Continuing to get weaker, more unsteady, no appetite.  This AM, his daughter came to check on him about 9 and he was on the floor in the bathroom.  He said he didn't fall but felt weak and sat down.  They got him back into the bed.  He was wheezing loudly, which he has been doing for the last several days.  +hot - ?fevers.  Some cough, occasionally productive of yellowish sputum.  He has a lot of urinary urgency with incontinence at baseline.  Very sedentary, has back pain that he complains is his kidneys but daughters think it might be related to minimal activity.   OT comments  Pt seated up in chair on OT arrival, marginally agreeable to OT treatment. Pt provided with BUE strengthening HEP using theraband. Pt completed exercises with OT, verbal cuing for form during completion. Pt and daughter educated on importance of BUE strengthening to facilitate BUE use with walker, transfers, bed mobility, and ADL completion; pt and daughter verbalize understanding. Discharge plan remains appropriate with no OT follow up.    Follow Up Recommendations  No OT follow up;Supervision/Assistance - 24 hour    Equipment Recommendations  None recommended by OT       Precautions / Restrictions  Precautions Precautions: Fall Restrictions Weight Bearing Restrictions: No       Mobility Bed Mobility               General bed mobility comments: Pt up in chair on OT arrival  Transfers                 General transfer comment: Not completed during session    Balance                                                            Cognition Arousal/Alertness: Awake/alert Behavior During Therapy: [facility] for tasks assessed/performed Overall Cognitive Status: Within Functional Limits for tasks assessed                                          Exercises Exercises: General Upper Extremity General Exercises - Upper Extremity Shoulder Flexion: Strengthening;Theraband;10 reps Theraband Level (Shoulder Flexion): Level 2 (Red) Shoulder Extension: Strengthening;Theraband;10 reps Theraband Level (Shoulder Extension): Level 2 (Red) Shoulder Horizontal ABduction: Strengthening;Theraband;10 reps Theraband Level (Shoulder Horizontal Abduction): Level 2 (Red) Shoulder Horizontal ADduction: Strengthening;Theraband;10 reps Theraband Level (Shoulder Horizontal Adduction): Level 2 (Red) Elbow Flexion: Strengthening;Theraband;10 reps Theraband Level (Elbow  Flexion): Level 2 (Red) Elbow Extension: Strengthening;Theraband;10 reps Theraband Level (Elbow Extension): Level 2 (Red)           Pertinent Vitals/ Pain       Pain Assessment: No/denies pain         Frequency  Min 2X/week        Progress Toward Goals  OT Goals(current goals can now be found in the care plan section)  Progress towards OT goals: Progressing toward goals  Acute Rehab OT Goals Patient Stated Goal: To go home OT Goal Formulation: With patient Time For Goal Achievement: 01/24/17 Potential to Achieve Goals: Good ADL Goals Pt Will Perform Lower Body Dressing: with supervision;sitting/lateral leans;sit to/from stand Pt Will Perform Toileting - Clothing  Manipulation and hygiene: with supervision;sitting/lateral leans;sit to/from stand Pt/caregiver will Perform Home Exercise Program: Increased strength;Both right and left upper extremity;With Supervision;With written HEP provided  Plan Discharge plan remains appropriate          End of Session    OT Visit Diagnosis: Muscle weakness (generalized) (M62.81);Unsteadiness on feet (R26.81)   Activity Tolerance Patient tolerated treatment well   Patient Left in chair;with call bell/phone within reach;with family/visitor present   Nurse Communication          Time: 3361-2244 OT Time Calculation (min): 12 min  Charges: OT General Charges $OT Visit: 1 Visit OT Treatments $Therapeutic Exercise: 8-22 mins    Guadelupe Sabin, OTR/L  [phone] 01/11/2017, 9:59 AM

## 2017-01-11 NOTE — Progress Notes (Addendum)
SATURATION QUALIFICATIONS: (This note is used to comply with regulatory documentation for home oxygen)  Patient Saturations on Room Air at Rest = 86%  Patient Saturations on Room Air while Ambulating =86%\  Patient Saturations while on 2 Liters of O2: 95%  Please briefly explain why patient needs home oxygen: Pt desaturation even at rest.

## 2017-01-11 NOTE — Care Management (Signed)
    Durable Medical Equipment        Start     Ordered   01/11/17 1114  For home use only DME oxygen  Once    Question Answer Comment  Mode or (Route) Nasal cannula   Liters per Minute 2   Frequency Continuous (stationary and portable oxygen unit needed)   Oxygen delivery system Gas      01/11/17 1113   01/11/17 1113  For home use only DME 3 n 1  Once     01/11/17 1113     Pt needs oxygen because alternate treatments (duo neb)  have been tried and found to be insufficient.

## 2017-01-12 LAB — CULTURE, BLOOD (ROUTINE X 2)
CULTURE: NO GROWTH
CULTURE: NO GROWTH
Special Requests: ADEQUATE
Special Requests: ADEQUATE

## 2017-01-13 DIAGNOSIS — Z9981 Dependence on supplemental oxygen: Secondary | ICD-10-CM | POA: Diagnosis not present

## 2017-01-13 DIAGNOSIS — I2510 Atherosclerotic heart disease of native coronary artery without angina pectoris: Secondary | ICD-10-CM | POA: Diagnosis not present

## 2017-01-13 DIAGNOSIS — F329 Major depressive disorder, single episode, unspecified: Secondary | ICD-10-CM | POA: Diagnosis not present

## 2017-01-13 DIAGNOSIS — Z85528 Personal history of other malignant neoplasm of kidney: Secondary | ICD-10-CM | POA: Diagnosis not present

## 2017-01-13 DIAGNOSIS — N183 Chronic kidney disease, stage 3 (moderate): Secondary | ICD-10-CM | POA: Diagnosis not present

## 2017-01-13 DIAGNOSIS — E039 Hypothyroidism, unspecified: Secondary | ICD-10-CM | POA: Diagnosis not present

## 2017-01-13 DIAGNOSIS — J189 Pneumonia, unspecified organism: Secondary | ICD-10-CM | POA: Diagnosis not present

## 2017-01-13 DIAGNOSIS — F419 Anxiety disorder, unspecified: Secondary | ICD-10-CM | POA: Diagnosis not present

## 2017-01-13 DIAGNOSIS — F0390 Unspecified dementia without behavioral disturbance: Secondary | ICD-10-CM | POA: Diagnosis not present

## 2017-01-13 DIAGNOSIS — J9621 Acute and chronic respiratory failure with hypoxia: Secondary | ICD-10-CM | POA: Diagnosis not present

## 2017-01-13 DIAGNOSIS — Z7982 Long term (current) use of aspirin: Secondary | ICD-10-CM | POA: Diagnosis not present

## 2017-01-13 DIAGNOSIS — I739 Peripheral vascular disease, unspecified: Secondary | ICD-10-CM | POA: Diagnosis not present

## 2017-01-13 DIAGNOSIS — J441 Chronic obstructive pulmonary disease with (acute) exacerbation: Secondary | ICD-10-CM | POA: Diagnosis not present

## 2017-01-13 DIAGNOSIS — Z7951 Long term (current) use of inhaled steroids: Secondary | ICD-10-CM | POA: Diagnosis not present

## 2017-01-13 DIAGNOSIS — J440 Chronic obstructive pulmonary disease with acute lower respiratory infection: Secondary | ICD-10-CM | POA: Diagnosis not present

## 2017-01-13 DIAGNOSIS — F17210 Nicotine dependence, cigarettes, uncomplicated: Secondary | ICD-10-CM | POA: Diagnosis not present

## 2017-01-13 DIAGNOSIS — I129 Hypertensive chronic kidney disease with stage 1 through stage 4 chronic kidney disease, or unspecified chronic kidney disease: Secondary | ICD-10-CM | POA: Diagnosis not present

## 2017-01-15 ENCOUNTER — Other Ambulatory Visit: Payer: Self-pay

## 2017-01-15 DIAGNOSIS — J441 Chronic obstructive pulmonary disease with (acute) exacerbation: Secondary | ICD-10-CM | POA: Diagnosis not present

## 2017-01-15 DIAGNOSIS — J440 Chronic obstructive pulmonary disease with acute lower respiratory infection: Secondary | ICD-10-CM | POA: Diagnosis not present

## 2017-01-15 DIAGNOSIS — F0390 Unspecified dementia without behavioral disturbance: Secondary | ICD-10-CM | POA: Diagnosis not present

## 2017-01-15 DIAGNOSIS — J189 Pneumonia, unspecified organism: Secondary | ICD-10-CM | POA: Diagnosis not present

## 2017-01-15 DIAGNOSIS — J9621 Acute and chronic respiratory failure with hypoxia: Secondary | ICD-10-CM | POA: Diagnosis not present

## 2017-01-15 DIAGNOSIS — I2510 Atherosclerotic heart disease of native coronary artery without angina pectoris: Secondary | ICD-10-CM | POA: Diagnosis not present

## 2017-01-15 MED ORDER — NITROGLYCERIN 0.4 MG SL SUBL: 0.4000 mg | SUBLINGUAL_TABLET | SUBLINGUAL | 3 refills | Status: AC | PRN

## 2017-01-16 DIAGNOSIS — J440 Chronic obstructive pulmonary disease with acute lower respiratory infection: Secondary | ICD-10-CM | POA: Diagnosis not present

## 2017-01-16 DIAGNOSIS — J441 Chronic obstructive pulmonary disease with (acute) exacerbation: Secondary | ICD-10-CM | POA: Diagnosis not present

## 2017-01-16 DIAGNOSIS — J189 Pneumonia, unspecified organism: Secondary | ICD-10-CM | POA: Diagnosis not present

## 2017-01-16 DIAGNOSIS — F0390 Unspecified dementia without behavioral disturbance: Secondary | ICD-10-CM | POA: Diagnosis not present

## 2017-01-16 DIAGNOSIS — I2510 Atherosclerotic heart disease of native coronary artery without angina pectoris: Secondary | ICD-10-CM | POA: Diagnosis not present

## 2017-01-16 DIAGNOSIS — J9621 Acute and chronic respiratory failure with hypoxia: Secondary | ICD-10-CM | POA: Diagnosis not present

## 2017-01-18 DIAGNOSIS — I2510 Atherosclerotic heart disease of native coronary artery without angina pectoris: Secondary | ICD-10-CM | POA: Diagnosis not present

## 2017-01-18 DIAGNOSIS — J440 Chronic obstructive pulmonary disease with acute lower respiratory infection: Secondary | ICD-10-CM | POA: Diagnosis not present

## 2017-01-18 DIAGNOSIS — J441 Chronic obstructive pulmonary disease with (acute) exacerbation: Secondary | ICD-10-CM | POA: Diagnosis not present

## 2017-01-18 DIAGNOSIS — J189 Pneumonia, unspecified organism: Secondary | ICD-10-CM | POA: Diagnosis not present

## 2017-01-18 DIAGNOSIS — F0390 Unspecified dementia without behavioral disturbance: Secondary | ICD-10-CM | POA: Diagnosis not present

## 2017-01-18 DIAGNOSIS — J9621 Acute and chronic respiratory failure with hypoxia: Secondary | ICD-10-CM | POA: Diagnosis not present

## 2017-01-19 DIAGNOSIS — F0390 Unspecified dementia without behavioral disturbance: Secondary | ICD-10-CM | POA: Diagnosis not present

## 2017-01-19 DIAGNOSIS — K441 Diaphragmatic hernia with gangrene: Secondary | ICD-10-CM | POA: Diagnosis not present

## 2017-01-19 DIAGNOSIS — J440 Chronic obstructive pulmonary disease with acute lower respiratory infection: Secondary | ICD-10-CM | POA: Diagnosis not present

## 2017-01-19 DIAGNOSIS — N178 Other acute kidney failure: Secondary | ICD-10-CM | POA: Diagnosis not present

## 2017-01-19 DIAGNOSIS — J189 Pneumonia, unspecified organism: Secondary | ICD-10-CM | POA: Diagnosis not present

## 2017-01-19 DIAGNOSIS — J441 Chronic obstructive pulmonary disease with (acute) exacerbation: Secondary | ICD-10-CM | POA: Diagnosis not present

## 2017-01-19 DIAGNOSIS — J9621 Acute and chronic respiratory failure with hypoxia: Secondary | ICD-10-CM | POA: Diagnosis not present

## 2017-01-19 DIAGNOSIS — I2510 Atherosclerotic heart disease of native coronary artery without angina pectoris: Secondary | ICD-10-CM | POA: Diagnosis not present

## 2017-01-23 DIAGNOSIS — J9621 Acute and chronic respiratory failure with hypoxia: Secondary | ICD-10-CM | POA: Diagnosis not present

## 2017-01-23 DIAGNOSIS — J189 Pneumonia, unspecified organism: Secondary | ICD-10-CM | POA: Diagnosis not present

## 2017-01-23 DIAGNOSIS — F0390 Unspecified dementia without behavioral disturbance: Secondary | ICD-10-CM | POA: Diagnosis not present

## 2017-01-23 DIAGNOSIS — J441 Chronic obstructive pulmonary disease with (acute) exacerbation: Secondary | ICD-10-CM | POA: Diagnosis not present

## 2017-01-23 DIAGNOSIS — I2510 Atherosclerotic heart disease of native coronary artery without angina pectoris: Secondary | ICD-10-CM | POA: Diagnosis not present

## 2017-01-23 DIAGNOSIS — J440 Chronic obstructive pulmonary disease with acute lower respiratory infection: Secondary | ICD-10-CM | POA: Diagnosis not present

## 2017-01-25 DIAGNOSIS — J440 Chronic obstructive pulmonary disease with acute lower respiratory infection: Secondary | ICD-10-CM | POA: Diagnosis not present

## 2017-01-25 DIAGNOSIS — F0390 Unspecified dementia without behavioral disturbance: Secondary | ICD-10-CM | POA: Diagnosis not present

## 2017-01-25 DIAGNOSIS — J441 Chronic obstructive pulmonary disease with (acute) exacerbation: Secondary | ICD-10-CM | POA: Diagnosis not present

## 2017-01-25 DIAGNOSIS — J189 Pneumonia, unspecified organism: Secondary | ICD-10-CM | POA: Diagnosis not present

## 2017-01-25 DIAGNOSIS — J9621 Acute and chronic respiratory failure with hypoxia: Secondary | ICD-10-CM | POA: Diagnosis not present

## 2017-01-25 DIAGNOSIS — I2510 Atherosclerotic heart disease of native coronary artery without angina pectoris: Secondary | ICD-10-CM | POA: Diagnosis not present

## 2017-01-26 DIAGNOSIS — J441 Chronic obstructive pulmonary disease with (acute) exacerbation: Secondary | ICD-10-CM | POA: Diagnosis not present

## 2017-01-26 DIAGNOSIS — J189 Pneumonia, unspecified organism: Secondary | ICD-10-CM | POA: Diagnosis not present

## 2017-01-26 DIAGNOSIS — F0390 Unspecified dementia without behavioral disturbance: Secondary | ICD-10-CM | POA: Diagnosis not present

## 2017-01-26 DIAGNOSIS — J9621 Acute and chronic respiratory failure with hypoxia: Secondary | ICD-10-CM | POA: Diagnosis not present

## 2017-01-26 DIAGNOSIS — J440 Chronic obstructive pulmonary disease with acute lower respiratory infection: Secondary | ICD-10-CM | POA: Diagnosis not present

## 2017-01-26 DIAGNOSIS — I2510 Atherosclerotic heart disease of native coronary artery without angina pectoris: Secondary | ICD-10-CM | POA: Diagnosis not present

## 2017-02-01 DIAGNOSIS — I2510 Atherosclerotic heart disease of native coronary artery without angina pectoris: Secondary | ICD-10-CM | POA: Diagnosis not present

## 2017-02-01 DIAGNOSIS — J9621 Acute and chronic respiratory failure with hypoxia: Secondary | ICD-10-CM | POA: Diagnosis not present

## 2017-02-01 DIAGNOSIS — J189 Pneumonia, unspecified organism: Secondary | ICD-10-CM | POA: Diagnosis not present

## 2017-02-01 DIAGNOSIS — F0390 Unspecified dementia without behavioral disturbance: Secondary | ICD-10-CM | POA: Diagnosis not present

## 2017-02-01 DIAGNOSIS — J440 Chronic obstructive pulmonary disease with acute lower respiratory infection: Secondary | ICD-10-CM | POA: Diagnosis not present

## 2017-02-01 DIAGNOSIS — J441 Chronic obstructive pulmonary disease with (acute) exacerbation: Secondary | ICD-10-CM | POA: Diagnosis not present

## 2017-02-03 DIAGNOSIS — J189 Pneumonia, unspecified organism: Secondary | ICD-10-CM | POA: Diagnosis not present

## 2017-02-03 DIAGNOSIS — J9621 Acute and chronic respiratory failure with hypoxia: Secondary | ICD-10-CM | POA: Diagnosis not present

## 2017-02-03 DIAGNOSIS — J440 Chronic obstructive pulmonary disease with acute lower respiratory infection: Secondary | ICD-10-CM | POA: Diagnosis not present

## 2017-02-03 DIAGNOSIS — J441 Chronic obstructive pulmonary disease with (acute) exacerbation: Secondary | ICD-10-CM | POA: Diagnosis not present

## 2017-02-03 DIAGNOSIS — F0390 Unspecified dementia without behavioral disturbance: Secondary | ICD-10-CM | POA: Diagnosis not present

## 2017-02-03 DIAGNOSIS — I2510 Atherosclerotic heart disease of native coronary artery without angina pectoris: Secondary | ICD-10-CM | POA: Diagnosis not present

## 2017-02-05 DIAGNOSIS — J440 Chronic obstructive pulmonary disease with acute lower respiratory infection: Secondary | ICD-10-CM | POA: Diagnosis not present

## 2017-02-05 DIAGNOSIS — J9621 Acute and chronic respiratory failure with hypoxia: Secondary | ICD-10-CM | POA: Diagnosis not present

## 2017-02-05 DIAGNOSIS — J441 Chronic obstructive pulmonary disease with (acute) exacerbation: Secondary | ICD-10-CM | POA: Diagnosis not present

## 2017-02-05 DIAGNOSIS — F0390 Unspecified dementia without behavioral disturbance: Secondary | ICD-10-CM | POA: Diagnosis not present

## 2017-02-05 DIAGNOSIS — I2510 Atherosclerotic heart disease of native coronary artery without angina pectoris: Secondary | ICD-10-CM | POA: Diagnosis not present

## 2017-02-05 DIAGNOSIS — J189 Pneumonia, unspecified organism: Secondary | ICD-10-CM | POA: Diagnosis not present

## 2017-02-08 DIAGNOSIS — J440 Chronic obstructive pulmonary disease with acute lower respiratory infection: Secondary | ICD-10-CM | POA: Diagnosis not present

## 2017-02-08 DIAGNOSIS — I2510 Atherosclerotic heart disease of native coronary artery without angina pectoris: Secondary | ICD-10-CM | POA: Diagnosis not present

## 2017-02-08 DIAGNOSIS — J189 Pneumonia, unspecified organism: Secondary | ICD-10-CM | POA: Diagnosis not present

## 2017-02-08 DIAGNOSIS — F0390 Unspecified dementia without behavioral disturbance: Secondary | ICD-10-CM | POA: Diagnosis not present

## 2017-02-08 DIAGNOSIS — J9621 Acute and chronic respiratory failure with hypoxia: Secondary | ICD-10-CM | POA: Diagnosis not present

## 2017-02-08 DIAGNOSIS — J441 Chronic obstructive pulmonary disease with (acute) exacerbation: Secondary | ICD-10-CM | POA: Diagnosis not present

## 2017-02-10 DIAGNOSIS — J441 Chronic obstructive pulmonary disease with (acute) exacerbation: Secondary | ICD-10-CM | POA: Diagnosis not present

## 2017-02-10 DIAGNOSIS — J9601 Acute respiratory failure with hypoxia: Secondary | ICD-10-CM | POA: Diagnosis not present

## 2017-02-10 DIAGNOSIS — J439 Emphysema, unspecified: Secondary | ICD-10-CM | POA: Diagnosis not present

## 2017-02-10 DIAGNOSIS — E782 Mixed hyperlipidemia: Secondary | ICD-10-CM | POA: Diagnosis not present

## 2017-02-10 DIAGNOSIS — E039 Hypothyroidism, unspecified: Secondary | ICD-10-CM | POA: Diagnosis not present

## 2017-02-10 DIAGNOSIS — C3411 Malignant neoplasm of upper lobe, right bronchus or lung: Secondary | ICD-10-CM | POA: Diagnosis not present

## 2017-02-10 DIAGNOSIS — J189 Pneumonia, unspecified organism: Secondary | ICD-10-CM | POA: Diagnosis not present

## 2017-02-10 DIAGNOSIS — Z6822 Body mass index (BMI) 22.0-22.9, adult: Secondary | ICD-10-CM | POA: Diagnosis not present

## 2017-02-12 ENCOUNTER — Ambulatory Visit (INDEPENDENT_AMBULATORY_CARE_PROVIDER_SITE_OTHER): Payer: Medicare Other | Admitting: Cardiology

## 2017-02-12 ENCOUNTER — Encounter: Payer: Self-pay | Admitting: Cardiology

## 2017-02-12 VITALS — BP 120/60 | HR 70 | Ht 68.0 in | Wt 144.0 lb

## 2017-02-12 DIAGNOSIS — I10 Essential (primary) hypertension: Secondary | ICD-10-CM

## 2017-02-12 DIAGNOSIS — I209 Angina pectoris, unspecified: Secondary | ICD-10-CM | POA: Diagnosis not present

## 2017-02-12 DIAGNOSIS — I25119 Atherosclerotic heart disease of native coronary artery with unspecified angina pectoris: Secondary | ICD-10-CM | POA: Diagnosis not present

## 2017-02-12 DIAGNOSIS — J449 Chronic obstructive pulmonary disease, unspecified: Secondary | ICD-10-CM | POA: Diagnosis not present

## 2017-02-12 DIAGNOSIS — F0280 Dementia in other diseases classified elsewhere without behavioral disturbance: Secondary | ICD-10-CM

## 2017-02-12 DIAGNOSIS — G309 Alzheimer's disease, unspecified: Secondary | ICD-10-CM | POA: Diagnosis not present

## 2017-02-12 NOTE — Progress Notes (Signed)
Cardiology Office Note  Date: 02/12/2017   ID: [name], DOB 1932/02/20, MRN [id number]  PCP: Curlene Labrum, MD  Primary Cardiologist: Rozann Lesches, MD   Chief Complaint  Patient presents with  . Coronary Artery Disease    History of Present Illness: Michael [name] is an 83 y.o. male last seen in April.I reviewed interval records. He was hospitalized in September with hypoxic respiratory failure in the setting of COPD exacerbation. He is managed on the hospitalist service.also found to have a spiculated mass in the right upper lobe by CT imaging, managed conservatively based on discharge summary.  He is here today with a family member. He has 24 hour assistance at home. At baseline he is sedentary, does not report any angina or increasing nitroglycerin use on current medical regimen. He is not reporting any fevers or chills, no increasing cough or shortness of breath at baseline. He does have supplemental oxygen to use at home.  Current cardiac regimen includes aspirin, Norvasc, Lipitor, Ranexa, and as needed nitroglycerin.  Past Medical History:  Diagnosis Date  . Allergic rhinitis   . Anemia   . Anxiety   . Arthritis   . Carotid artery occlusion   . COPD (chronic obstructive pulmonary disease) ([place])   . Coronary atherosclerosis of native coronary artery    BMS RCA 2003, DES RCA 2004, DES RCA 2005; last cath 2017   . Dementia   . Depression   . Essential hypertension   . GERD (gastroesophageal reflux disease)   . Hemorrhoids   . History of asbestos exposure   . History of herpes zoster 2001  . HOH (hard of hearing)   . Hyperthyroidism 1978   Status post radioactive iodine treatment  . Hypothyroidism   . Lumbar pain   . Mixed hyperlipidemia   . Peripheral arterial disease ([place])   . Pneumonia November 2014  . Prostate cancer [facility])    Status post XRT/IMRT 2008 - Dr. Alinda Money  . Psoriasis   . Renal cancer ([place])    Cryoablation in 2012    Past  Surgical History:  Procedure Laterality Date  . ANTERIOR CERVICAL DECOMP/DISCECTOMY FUSION  07/2002  . BACK SURGERY  07/2002  . CARDIAC CATHETERIZATION  09/2004, 08/2005, 10/2007; 01/2009; 11/2010; 07/02/2015  . CARDIAC CATHETERIZATION N/A 07/02/2015   Procedure: Left Heart Cath and Coronary Angiography;  Surgeon: Jettie Booze, MD;  Location: [facility];  Service: Cardiovascular;  Laterality: N/A;  . CAROTID ENDARTERECTOMY Bilateral 08/2011; 11/2012   Dr. [name]  . CATARACT EXTRACTION W/ INTRAOCULAR LENS  IMPLANT, BILATERAL Bilateral spring 2010  . COLONOSCOPY  03/2005; 11/2007   "normal; scattered diverticulosis, internal hemorrhoid"  . CORONARY ANGIOPLASTY WITH STENT PLACEMENT  11/2001,11/2002, 01/2004     HAS 3 STENTS DONE AT 3 DIFFERENT CATHS - ONE IS DRUG ELUTING STENT  . ENDARTERECTOMY  08/30/2011   Procedure: Left ENDARTERECTOMY CAROTID;  Surgeon: Conrad [place], MD;  Location: [facility];  Service: Vascular;  Laterality: Left;  . ENDARTERECTOMY Right 12/18/2012   Procedure: ENDARTERECTOMY CAROTID WITH BOVINE PATCH ANGIOPLASTY;  Surgeon: Conrad Chilton, MD;  Location: [facility];  Service: Vascular;  Laterality: Right;  . ESOPHAGOGASTRODUODENOSCOPY  12/2005; 11/2007   "normal; inflammation"  . EYE SURGERY Right 08/2007   vitreous hemorrhage  . JOINT REPLACEMENT     Right partial Knee  . KNEE ARTHROPLASTY     bilateral  . LAPAROSCOPIC CHOLECYSTECTOMY  10/1993  . NASAL SEPTUM SURGERY  [facility]  . PARTIAL KNEE ARTHROPLASTY Right 10/2003  . POPLITEAL SYNOVIAL CYST EXCISION    . RENAL CRYOABLATION  10/2010  . TOTAL KNEE ARTHROPLASTY Left 2011  . TOTAL KNEE REVISION Right 08/06/2013   Procedure: REVISION RIGHT TOTAL KNEE ARTHROPLASTY  ;  Surgeon: Gearlean Alf, MD;  Location: [facility];  Service: Orthopedics;  Laterality: Right;    Current Outpatient Prescriptions  Medication Sig Dispense Refill  . amLODipine (NORVASC) 5 MG tablet TAKE 1 TABLET EVERY MORNING 90 tablet 3  . aspirin 81 MG tablet Take 81 mg  by mouth every morning.     Marland Kitchen atorvastatin (LIPITOR) 40 MG tablet Take 40 mg by mouth at bedtime.     . cetirizine (ZYRTEC) 10 MG tablet Take 10 mg by mouth every morning.     . donepezil (ARICEPT) 5 MG tablet Take 5 mg by mouth daily.     . ferrous sulfate 325 (65 FE) MG tablet Take 325 mg by mouth at bedtime.     . Fluticasone-Salmeterol (ADVAIR DISKUS) 250-50 MCG/DOSE AEPB Inhale 1 puff into the lungs every 12 (twelve) hours.      Marland Kitchen levothyroxine (SYNTHROID, LEVOTHROID) 125 MCG tablet Take 125 mcg by mouth every morning.     . Multiple Vitamin (MULTIVITAMIN) tablet Take 1 tablet by mouth every morning.     . nitroGLYCERIN (NITROSTAT) 0.4 MG SL tablet Place 1 tablet (0.4 mg total) under the tongue every 5 (five) minutes as needed. For chest pain 25 tablet 3  . ranitidine (ZANTAC) 150 MG tablet Take 150 mg by mouth every morning.     . ranolazine (RANEXA) 500 MG 12 hr tablet Take 1 tablet (500 mg total) by mouth 2 (two) times daily. 180 tablet 3  . traZODone (DESYREL) 100 MG tablet Take 100 mg by mouth at bedtime as needed. For sleep    . venlafaxine XR (EFFEXOR-XR) 75 MG 24 hr capsule Take 75 mg by mouth every morning.     No current facility-administered medications for this visit.    Allergies:  Hydrocodone-acetaminophen; Oxycodone; Penicillins; Lyrica [pregabalin]; Morphine and related; Ciprofloxacin; and Iron   Social History: The patient  reports that he has been smoking Cigarettes.  He started smoking about 73 years ago. He has a 33.00 pack-year smoking history. He has never used smokeless tobacco. He reports that he does not drink alcohol or use drugs.   ROS:  Please see the history of present illness. Otherwise, complete review of systems is positive for chronic memory loss.  All other systems are reviewed and negative.   Physical Exam: VS:  BP 120/60   Pulse 70   Ht 5\' 8"  (1.727 m)   Wt 144 lb (65.3 kg)   SpO2 95%   BMI 21.90 kg/m , BMI Body mass index is 21.9 kg/m.  Wt  Readings from Last 3 Encounters:  02/12/17 144 lb (65.3 kg)  01/07/17 140 lb (63.5 kg)  08/14/16 140 lb 3.2 oz (63.6 kg)    General: Elderly male, seated in wheelchair. HEENT: Conjunctiva and lids normal, oropharynx clear. Neck: Supple, no elevated JVP or carotid bruits, no thyromegaly. Lungs: Decreased breath sounds without wheezing, nonlabored breathing at rest. Cardiac: Regular rate and rhythm, no S3 or significant systolic murmur, no pericardial rub. Abdomen: Soft, nontender, bowel sounds present, no guarding or rebound. Extremities: No pitting edema, distal pulses 2+. Skin: Warm and dry. Musculoskeletal: No kyphosis. Neuropsychiatric: Alert and oriented x3, affect grossly appropriate.  ECG: I personally reviewed the tracing  from 01/07/2017 which showed sinus rhythm with prolonged PR interval.  Recent Labwork: 01/07/2017: ALT 21; AST 40; TSH 1.135 01/11/2017: BUN 19; Creatinine, Ser 0.95; Hemoglobin 11.9; Magnesium 1.7; Platelets 239; Potassium 4.2; Sodium 128     Component Value Date/Time   CHOL 117 01/11/2017 0559   TRIG 66 01/11/2017 0559   HDL 57 01/11/2017 0559   CHOLHDL 2.1 01/11/2017 0559   VLDL 13 01/11/2017 0559   LDLCALC 47 01/11/2017 0559    Other Studies Reviewed Today:  Cardiac catheterization 07/02/2015:  Patent stents in the RCA with only mild instent restenosis.  Nononbstructive disease in the LAD and circumflex.  The left ventricular systolic function is normal.  Normal LVEDP.  Chest CT 01/10/2017: IMPRESSION: 1. 3.1 cm spiculated mass peripheral right upper lobe is highly suspicious for primary bronchogenic neoplasm. PET-CT recommended to further evaluate. 2. Mild right hilar lymphadenopathy. 3. Interstitial and patchy ground-glass attenuation in the left lung, nonspecific. This may be related to infection/inflammation. Attention on follow-up suggested. 4.  Emphysema. (ICD10-J43.9) 5. Coronary artery and Aortic Atherosclerois (ICD10-170.0) 6. 2.2 cm  left adrenal nodule likely adenoma.  Assessment and Plan:  1. CAD with history of previous RCA interventions as outlined above, currently stable without progressive angina on medical therapy. Continue with conservative management.  2. COPD, interval hospitalization noted with exacerbation. Also found to have spiculated lung mass concerning for carcinoma. Per discussion with family member today,they do not plan to aggressively pursue this. He has seen Dr. Pleas Koch in the interim as well.  3. Essential hypertension, blood pressure is well controlled today.  4. History of dementia and depression. He is on Aricept and Effexor XR. Still with gradual decline  Current medicines were reviewed with the patient today.  Disposition: Follow-up in 6 months.  Signed, Michael Sark, MD, [facility] 02/12/2017 2:25 PM    [facility] [address], [place], [place] [address] Phone: [phone]; Fax: [phone]

## 2017-02-12 NOTE — Patient Instructions (Signed)
Your physician wants you to follow-up in:  6 months at the [facility] with [place] will receive a reminder letter in the mail two months in advance. If you don't receive a letter, please call our office to schedule the follow-up appointment.    Your physician recommends that you continue on your current medications as directed. Please refer to the Current Medication list given to you today.    If you need a refill on your cardiac medications before your next appointment, please call your pharmacy.      No lab work or tests ordered today.      Thank you for choosing [facility] !

## 2017-02-13 DIAGNOSIS — J440 Chronic obstructive pulmonary disease with acute lower respiratory infection: Secondary | ICD-10-CM | POA: Diagnosis not present

## 2017-02-13 DIAGNOSIS — F0390 Unspecified dementia without behavioral disturbance: Secondary | ICD-10-CM | POA: Diagnosis not present

## 2017-02-13 DIAGNOSIS — J441 Chronic obstructive pulmonary disease with (acute) exacerbation: Secondary | ICD-10-CM | POA: Diagnosis not present

## 2017-02-13 DIAGNOSIS — J189 Pneumonia, unspecified organism: Secondary | ICD-10-CM | POA: Diagnosis not present

## 2017-02-13 DIAGNOSIS — I2510 Atherosclerotic heart disease of native coronary artery without angina pectoris: Secondary | ICD-10-CM | POA: Diagnosis not present

## 2017-02-13 DIAGNOSIS — J9621 Acute and chronic respiratory failure with hypoxia: Secondary | ICD-10-CM | POA: Diagnosis not present

## 2017-02-15 DIAGNOSIS — J440 Chronic obstructive pulmonary disease with acute lower respiratory infection: Secondary | ICD-10-CM | POA: Diagnosis not present

## 2017-02-15 DIAGNOSIS — J189 Pneumonia, unspecified organism: Secondary | ICD-10-CM | POA: Diagnosis not present

## 2017-02-15 DIAGNOSIS — J9621 Acute and chronic respiratory failure with hypoxia: Secondary | ICD-10-CM | POA: Diagnosis not present

## 2017-02-15 DIAGNOSIS — I2510 Atherosclerotic heart disease of native coronary artery without angina pectoris: Secondary | ICD-10-CM | POA: Diagnosis not present

## 2017-02-15 DIAGNOSIS — J441 Chronic obstructive pulmonary disease with (acute) exacerbation: Secondary | ICD-10-CM | POA: Diagnosis not present

## 2017-02-15 DIAGNOSIS — F0390 Unspecified dementia without behavioral disturbance: Secondary | ICD-10-CM | POA: Diagnosis not present

## 2017-02-21 DIAGNOSIS — F0390 Unspecified dementia without behavioral disturbance: Secondary | ICD-10-CM | POA: Diagnosis not present

## 2017-02-21 DIAGNOSIS — I2510 Atherosclerotic heart disease of native coronary artery without angina pectoris: Secondary | ICD-10-CM | POA: Diagnosis not present

## 2017-02-21 DIAGNOSIS — J9621 Acute and chronic respiratory failure with hypoxia: Secondary | ICD-10-CM | POA: Diagnosis not present

## 2017-02-21 DIAGNOSIS — J441 Chronic obstructive pulmonary disease with (acute) exacerbation: Secondary | ICD-10-CM | POA: Diagnosis not present

## 2017-02-21 DIAGNOSIS — J440 Chronic obstructive pulmonary disease with acute lower respiratory infection: Secondary | ICD-10-CM | POA: Diagnosis not present

## 2017-02-21 DIAGNOSIS — J189 Pneumonia, unspecified organism: Secondary | ICD-10-CM | POA: Diagnosis not present

## 2017-03-28 DIAGNOSIS — J189 Pneumonia, unspecified organism: Secondary | ICD-10-CM | POA: Diagnosis not present

## 2017-03-28 DIAGNOSIS — J440 Chronic obstructive pulmonary disease with acute lower respiratory infection: Secondary | ICD-10-CM | POA: Diagnosis not present

## 2017-03-28 DIAGNOSIS — J441 Chronic obstructive pulmonary disease with (acute) exacerbation: Secondary | ICD-10-CM | POA: Diagnosis not present

## 2017-03-28 DIAGNOSIS — J9621 Acute and chronic respiratory failure with hypoxia: Secondary | ICD-10-CM | POA: Diagnosis not present

## 2017-03-29 DIAGNOSIS — J441 Chronic obstructive pulmonary disease with (acute) exacerbation: Secondary | ICD-10-CM | POA: Diagnosis not present

## 2017-03-29 DIAGNOSIS — F329 Major depressive disorder, single episode, unspecified: Secondary | ICD-10-CM | POA: Diagnosis not present

## 2017-03-29 DIAGNOSIS — E782 Mixed hyperlipidemia: Secondary | ICD-10-CM | POA: Diagnosis not present

## 2017-03-29 DIAGNOSIS — N183 Chronic kidney disease, stage 3 (moderate): Secondary | ICD-10-CM | POA: Diagnosis not present

## 2017-03-29 DIAGNOSIS — E871 Hypo-osmolality and hyponatremia: Secondary | ICD-10-CM | POA: Diagnosis not present

## 2017-03-29 DIAGNOSIS — D519 Vitamin B12 deficiency anemia, unspecified: Secondary | ICD-10-CM | POA: Diagnosis not present

## 2017-03-29 DIAGNOSIS — D529 Folate deficiency anemia, unspecified: Secondary | ICD-10-CM | POA: Diagnosis not present

## 2017-03-29 DIAGNOSIS — I10 Essential (primary) hypertension: Secondary | ICD-10-CM | POA: Diagnosis not present

## 2017-03-29 DIAGNOSIS — D509 Iron deficiency anemia, unspecified: Secondary | ICD-10-CM | POA: Diagnosis not present

## 2017-03-29 DIAGNOSIS — F17210 Nicotine dependence, cigarettes, uncomplicated: Secondary | ICD-10-CM | POA: Diagnosis not present

## 2017-03-29 DIAGNOSIS — E039 Hypothyroidism, unspecified: Secondary | ICD-10-CM | POA: Diagnosis not present

## 2017-03-29 DIAGNOSIS — K219 Gastro-esophageal reflux disease without esophagitis: Secondary | ICD-10-CM | POA: Diagnosis not present

## 2017-04-04 DIAGNOSIS — C3411 Malignant neoplasm of upper lobe, right bronchus or lung: Secondary | ICD-10-CM | POA: Diagnosis not present

## 2017-04-04 DIAGNOSIS — J439 Emphysema, unspecified: Secondary | ICD-10-CM | POA: Diagnosis not present

## 2017-04-04 DIAGNOSIS — E871 Hypo-osmolality and hyponatremia: Secondary | ICD-10-CM | POA: Diagnosis not present

## 2017-04-04 DIAGNOSIS — Z6822 Body mass index (BMI) 22.0-22.9, adult: Secondary | ICD-10-CM | POA: Diagnosis not present

## 2017-04-04 DIAGNOSIS — J9601 Acute respiratory failure with hypoxia: Secondary | ICD-10-CM | POA: Diagnosis not present

## 2017-04-04 DIAGNOSIS — E039 Hypothyroidism, unspecified: Secondary | ICD-10-CM | POA: Diagnosis not present

## 2017-04-04 DIAGNOSIS — D075 Carcinoma in situ of prostate: Secondary | ICD-10-CM | POA: Diagnosis not present

## 2017-04-04 DIAGNOSIS — E782 Mixed hyperlipidemia: Secondary | ICD-10-CM | POA: Diagnosis not present

## 2017-05-22 ENCOUNTER — Telehealth: Payer: Self-pay | Admitting: Cardiology

## 2017-05-22 NOTE — Telephone Encounter (Signed)
Spoke with daughter and she wanted to know that since patient is on palliative care at this point, can he still get his cardiac refills when needed since he is completely home bound. Daughter advised that cardiac medications can be refilled by our office when they are needed. Daughter advised to have the pharmacy contact our office r/e any refills. Daughter verbalized understanding of plan.

## 2017-05-22 NOTE — Telephone Encounter (Signed)
Lytle Michaels (daughter) called stating that patient is on palletive care now. Daughter states that he has 24 hour care.  She is wanting to know if Dr. Domenic Polite would refill his heart medications without having to bring patient into the office. Please call [phone] to speak to [place].

## 2017-06-06 DIAGNOSIS — N183 Chronic kidney disease, stage 3 (moderate): Secondary | ICD-10-CM | POA: Diagnosis not present

## 2017-06-06 DIAGNOSIS — E039 Hypothyroidism, unspecified: Secondary | ICD-10-CM | POA: Diagnosis not present

## 2017-06-06 DIAGNOSIS — I2510 Atherosclerotic heart disease of native coronary artery without angina pectoris: Secondary | ICD-10-CM | POA: Diagnosis not present

## 2017-06-06 DIAGNOSIS — F0390 Unspecified dementia without behavioral disturbance: Secondary | ICD-10-CM | POA: Diagnosis not present

## 2017-06-06 DIAGNOSIS — Z6823 Body mass index (BMI) 23.0-23.9, adult: Secondary | ICD-10-CM | POA: Diagnosis not present

## 2017-06-06 DIAGNOSIS — R600 Localized edema: Secondary | ICD-10-CM | POA: Diagnosis not present

## 2017-06-06 DIAGNOSIS — J439 Emphysema, unspecified: Secondary | ICD-10-CM | POA: Diagnosis not present

## 2017-06-06 DIAGNOSIS — I10 Essential (primary) hypertension: Secondary | ICD-10-CM | POA: Diagnosis not present

## 2017-06-28 DIAGNOSIS — E782 Mixed hyperlipidemia: Secondary | ICD-10-CM | POA: Diagnosis not present

## 2017-06-28 DIAGNOSIS — N183 Chronic kidney disease, stage 3 (moderate): Secondary | ICD-10-CM | POA: Diagnosis not present

## 2017-06-28 DIAGNOSIS — K219 Gastro-esophageal reflux disease without esophagitis: Secondary | ICD-10-CM | POA: Diagnosis not present

## 2017-06-28 DIAGNOSIS — E871 Hypo-osmolality and hyponatremia: Secondary | ICD-10-CM | POA: Diagnosis not present

## 2017-06-28 DIAGNOSIS — J441 Chronic obstructive pulmonary disease with (acute) exacerbation: Secondary | ICD-10-CM | POA: Diagnosis not present

## 2017-06-28 DIAGNOSIS — F17210 Nicotine dependence, cigarettes, uncomplicated: Secondary | ICD-10-CM | POA: Diagnosis not present

## 2017-06-28 DIAGNOSIS — F329 Major depressive disorder, single episode, unspecified: Secondary | ICD-10-CM | POA: Diagnosis not present

## 2017-06-28 DIAGNOSIS — E039 Hypothyroidism, unspecified: Secondary | ICD-10-CM | POA: Diagnosis not present

## 2017-06-28 DIAGNOSIS — I10 Essential (primary) hypertension: Secondary | ICD-10-CM | POA: Diagnosis not present

## 2017-07-04 DIAGNOSIS — I10 Essential (primary) hypertension: Secondary | ICD-10-CM | POA: Diagnosis not present

## 2017-07-04 DIAGNOSIS — E039 Hypothyroidism, unspecified: Secondary | ICD-10-CM | POA: Diagnosis not present

## 2017-07-04 DIAGNOSIS — I2510 Atherosclerotic heart disease of native coronary artery without angina pectoris: Secondary | ICD-10-CM | POA: Diagnosis not present

## 2017-07-04 DIAGNOSIS — N183 Chronic kidney disease, stage 3 (moderate): Secondary | ICD-10-CM | POA: Diagnosis not present

## 2017-07-04 DIAGNOSIS — J441 Chronic obstructive pulmonary disease with (acute) exacerbation: Secondary | ICD-10-CM | POA: Diagnosis not present

## 2017-07-04 DIAGNOSIS — F17210 Nicotine dependence, cigarettes, uncomplicated: Secondary | ICD-10-CM | POA: Diagnosis not present

## 2017-07-04 DIAGNOSIS — Z6823 Body mass index (BMI) 23.0-23.9, adult: Secondary | ICD-10-CM | POA: Diagnosis not present

## 2017-07-04 DIAGNOSIS — J439 Emphysema, unspecified: Secondary | ICD-10-CM | POA: Diagnosis not present

## 2017-07-22 ENCOUNTER — Other Ambulatory Visit: Payer: Self-pay | Admitting: Cardiology

## 2017-08-24 DIAGNOSIS — J441 Chronic obstructive pulmonary disease with (acute) exacerbation: Secondary | ICD-10-CM | POA: Diagnosis present

## 2017-08-24 DIAGNOSIS — Z8546 Personal history of malignant neoplasm of prostate: Secondary | ICD-10-CM | POA: Diagnosis not present

## 2017-08-24 DIAGNOSIS — C3490 Malignant neoplasm of unspecified part of unspecified bronchus or lung: Secondary | ICD-10-CM | POA: Diagnosis not present

## 2017-08-24 DIAGNOSIS — F0390 Unspecified dementia without behavioral disturbance: Secondary | ICD-10-CM | POA: Diagnosis present

## 2017-08-24 DIAGNOSIS — C719 Malignant neoplasm of brain, unspecified: Secondary | ICD-10-CM | POA: Diagnosis not present

## 2017-08-24 DIAGNOSIS — I69359 Hemiplegia and hemiparesis following cerebral infarction affecting unspecified side: Secondary | ICD-10-CM | POA: Diagnosis not present

## 2017-08-24 DIAGNOSIS — Z79899 Other long term (current) drug therapy: Secondary | ICD-10-CM | POA: Diagnosis not present

## 2017-08-24 DIAGNOSIS — M7989 Other specified soft tissue disorders: Secondary | ICD-10-CM | POA: Diagnosis present

## 2017-08-24 DIAGNOSIS — C801 Malignant (primary) neoplasm, unspecified: Secondary | ICD-10-CM | POA: Diagnosis not present

## 2017-08-24 DIAGNOSIS — I639 Cerebral infarction, unspecified: Secondary | ICD-10-CM | POA: Diagnosis not present

## 2017-08-24 DIAGNOSIS — Z85528 Personal history of other malignant neoplasm of kidney: Secondary | ICD-10-CM | POA: Diagnosis not present

## 2017-08-24 DIAGNOSIS — Z66 Do not resuscitate: Secondary | ICD-10-CM | POA: Diagnosis present

## 2017-08-24 DIAGNOSIS — C7931 Secondary malignant neoplasm of brain: Secondary | ICD-10-CM | POA: Diagnosis not present

## 2017-08-24 DIAGNOSIS — Z6823 Body mass index (BMI) 23.0-23.9, adult: Secondary | ICD-10-CM | POA: Diagnosis not present

## 2017-08-24 DIAGNOSIS — Z87891 Personal history of nicotine dependence: Secondary | ICD-10-CM | POA: Diagnosis not present

## 2017-08-24 DIAGNOSIS — Z7982 Long term (current) use of aspirin: Secondary | ICD-10-CM | POA: Diagnosis not present

## 2017-08-24 DIAGNOSIS — E0590 Thyrotoxicosis, unspecified without thyrotoxic crisis or storm: Secondary | ICD-10-CM | POA: Diagnosis present

## 2017-08-24 DIAGNOSIS — I2510 Atherosclerotic heart disease of native coronary artery without angina pectoris: Secondary | ICD-10-CM | POA: Diagnosis present

## 2017-08-24 DIAGNOSIS — R222 Localized swelling, mass and lump, trunk: Secondary | ICD-10-CM | POA: Diagnosis not present

## 2017-08-24 DIAGNOSIS — J189 Pneumonia, unspecified organism: Secondary | ICD-10-CM | POA: Diagnosis not present

## 2017-08-24 DIAGNOSIS — C3491 Malignant neoplasm of unspecified part of right bronchus or lung: Secondary | ICD-10-CM | POA: Diagnosis present

## 2017-08-24 DIAGNOSIS — F17200 Nicotine dependence, unspecified, uncomplicated: Secondary | ICD-10-CM | POA: Diagnosis present

## 2017-08-26 DIAGNOSIS — Z85118 Personal history of other malignant neoplasm of bronchus and lung: Secondary | ICD-10-CM | POA: Diagnosis not present

## 2017-08-26 DIAGNOSIS — F0390 Unspecified dementia without behavioral disturbance: Secondary | ICD-10-CM | POA: Diagnosis not present

## 2017-08-26 DIAGNOSIS — I2510 Atherosclerotic heart disease of native coronary artery without angina pectoris: Secondary | ICD-10-CM | POA: Diagnosis not present

## 2017-08-26 DIAGNOSIS — E079 Disorder of thyroid, unspecified: Secondary | ICD-10-CM | POA: Diagnosis not present

## 2017-08-26 DIAGNOSIS — J449 Chronic obstructive pulmonary disease, unspecified: Secondary | ICD-10-CM | POA: Diagnosis not present

## 2017-08-27 DIAGNOSIS — I2510 Atherosclerotic heart disease of native coronary artery without angina pectoris: Secondary | ICD-10-CM | POA: Diagnosis not present

## 2017-08-27 DIAGNOSIS — E079 Disorder of thyroid, unspecified: Secondary | ICD-10-CM | POA: Diagnosis not present

## 2017-08-27 DIAGNOSIS — J449 Chronic obstructive pulmonary disease, unspecified: Secondary | ICD-10-CM | POA: Diagnosis not present

## 2017-08-27 DIAGNOSIS — Z85118 Personal history of other malignant neoplasm of bronchus and lung: Secondary | ICD-10-CM | POA: Diagnosis not present

## 2017-08-27 DIAGNOSIS — F0390 Unspecified dementia without behavioral disturbance: Secondary | ICD-10-CM | POA: Diagnosis not present

## 2017-08-29 DIAGNOSIS — I2510 Atherosclerotic heart disease of native coronary artery without angina pectoris: Secondary | ICD-10-CM | POA: Diagnosis not present

## 2017-08-29 DIAGNOSIS — Z85118 Personal history of other malignant neoplasm of bronchus and lung: Secondary | ICD-10-CM | POA: Diagnosis not present

## 2017-08-29 DIAGNOSIS — E079 Disorder of thyroid, unspecified: Secondary | ICD-10-CM | POA: Diagnosis not present

## 2017-08-29 DIAGNOSIS — J449 Chronic obstructive pulmonary disease, unspecified: Secondary | ICD-10-CM | POA: Diagnosis not present

## 2017-08-29 DIAGNOSIS — F0390 Unspecified dementia without behavioral disturbance: Secondary | ICD-10-CM | POA: Diagnosis not present

## 2017-08-31 DIAGNOSIS — J449 Chronic obstructive pulmonary disease, unspecified: Secondary | ICD-10-CM | POA: Diagnosis not present

## 2017-08-31 DIAGNOSIS — F0390 Unspecified dementia without behavioral disturbance: Secondary | ICD-10-CM | POA: Diagnosis not present

## 2017-08-31 DIAGNOSIS — Z85118 Personal history of other malignant neoplasm of bronchus and lung: Secondary | ICD-10-CM | POA: Diagnosis not present

## 2017-08-31 DIAGNOSIS — I2510 Atherosclerotic heart disease of native coronary artery without angina pectoris: Secondary | ICD-10-CM | POA: Diagnosis not present

## 2017-08-31 DIAGNOSIS — E079 Disorder of thyroid, unspecified: Secondary | ICD-10-CM | POA: Diagnosis not present

## 2017-09-03 DIAGNOSIS — Z85118 Personal history of other malignant neoplasm of bronchus and lung: Secondary | ICD-10-CM | POA: Diagnosis not present

## 2017-09-03 DIAGNOSIS — E079 Disorder of thyroid, unspecified: Secondary | ICD-10-CM | POA: Diagnosis not present

## 2017-09-03 DIAGNOSIS — J449 Chronic obstructive pulmonary disease, unspecified: Secondary | ICD-10-CM | POA: Diagnosis not present

## 2017-09-03 DIAGNOSIS — F0390 Unspecified dementia without behavioral disturbance: Secondary | ICD-10-CM | POA: Diagnosis not present

## 2017-09-03 DIAGNOSIS — I2510 Atherosclerotic heart disease of native coronary artery without angina pectoris: Secondary | ICD-10-CM | POA: Diagnosis not present

## 2017-09-05 DIAGNOSIS — I2510 Atherosclerotic heart disease of native coronary artery without angina pectoris: Secondary | ICD-10-CM | POA: Diagnosis not present

## 2017-09-05 DIAGNOSIS — F0390 Unspecified dementia without behavioral disturbance: Secondary | ICD-10-CM | POA: Diagnosis not present

## 2017-09-05 DIAGNOSIS — J449 Chronic obstructive pulmonary disease, unspecified: Secondary | ICD-10-CM | POA: Diagnosis not present

## 2017-09-05 DIAGNOSIS — E079 Disorder of thyroid, unspecified: Secondary | ICD-10-CM | POA: Diagnosis not present

## 2017-09-05 DIAGNOSIS — Z85118 Personal history of other malignant neoplasm of bronchus and lung: Secondary | ICD-10-CM | POA: Diagnosis not present

## 2017-09-06 DIAGNOSIS — I2510 Atherosclerotic heart disease of native coronary artery without angina pectoris: Secondary | ICD-10-CM | POA: Diagnosis not present

## 2017-09-06 DIAGNOSIS — Z85118 Personal history of other malignant neoplasm of bronchus and lung: Secondary | ICD-10-CM | POA: Diagnosis not present

## 2017-09-06 DIAGNOSIS — J449 Chronic obstructive pulmonary disease, unspecified: Secondary | ICD-10-CM | POA: Diagnosis not present

## 2017-09-06 DIAGNOSIS — F0390 Unspecified dementia without behavioral disturbance: Secondary | ICD-10-CM | POA: Diagnosis not present

## 2017-09-06 DIAGNOSIS — E079 Disorder of thyroid, unspecified: Secondary | ICD-10-CM | POA: Diagnosis not present

## 2017-09-07 DIAGNOSIS — Z85118 Personal history of other malignant neoplasm of bronchus and lung: Secondary | ICD-10-CM | POA: Diagnosis not present

## 2017-09-07 DIAGNOSIS — F0390 Unspecified dementia without behavioral disturbance: Secondary | ICD-10-CM | POA: Diagnosis not present

## 2017-09-07 DIAGNOSIS — E079 Disorder of thyroid, unspecified: Secondary | ICD-10-CM | POA: Diagnosis not present

## 2017-09-07 DIAGNOSIS — J449 Chronic obstructive pulmonary disease, unspecified: Secondary | ICD-10-CM | POA: Diagnosis not present

## 2017-09-07 DIAGNOSIS — I2510 Atherosclerotic heart disease of native coronary artery without angina pectoris: Secondary | ICD-10-CM | POA: Diagnosis not present

## 2017-09-10 DIAGNOSIS — F0390 Unspecified dementia without behavioral disturbance: Secondary | ICD-10-CM | POA: Diagnosis not present

## 2017-09-10 DIAGNOSIS — Z85118 Personal history of other malignant neoplasm of bronchus and lung: Secondary | ICD-10-CM | POA: Diagnosis not present

## 2017-09-10 DIAGNOSIS — J449 Chronic obstructive pulmonary disease, unspecified: Secondary | ICD-10-CM | POA: Diagnosis not present

## 2017-09-10 DIAGNOSIS — E079 Disorder of thyroid, unspecified: Secondary | ICD-10-CM | POA: Diagnosis not present

## 2017-09-10 DIAGNOSIS — I2510 Atherosclerotic heart disease of native coronary artery without angina pectoris: Secondary | ICD-10-CM | POA: Diagnosis not present

## 2017-09-12 DIAGNOSIS — E079 Disorder of thyroid, unspecified: Secondary | ICD-10-CM | POA: Diagnosis not present

## 2017-09-12 DIAGNOSIS — I2510 Atherosclerotic heart disease of native coronary artery without angina pectoris: Secondary | ICD-10-CM | POA: Diagnosis not present

## 2017-09-12 DIAGNOSIS — J449 Chronic obstructive pulmonary disease, unspecified: Secondary | ICD-10-CM | POA: Diagnosis not present

## 2017-09-12 DIAGNOSIS — Z85118 Personal history of other malignant neoplasm of bronchus and lung: Secondary | ICD-10-CM | POA: Diagnosis not present

## 2017-09-12 DIAGNOSIS — F0390 Unspecified dementia without behavioral disturbance: Secondary | ICD-10-CM | POA: Diagnosis not present

## 2017-09-14 DIAGNOSIS — J449 Chronic obstructive pulmonary disease, unspecified: Secondary | ICD-10-CM | POA: Diagnosis not present

## 2017-09-14 DIAGNOSIS — I2510 Atherosclerotic heart disease of native coronary artery without angina pectoris: Secondary | ICD-10-CM | POA: Diagnosis not present

## 2017-09-14 DIAGNOSIS — F0390 Unspecified dementia without behavioral disturbance: Secondary | ICD-10-CM | POA: Diagnosis not present

## 2017-09-14 DIAGNOSIS — Z85118 Personal history of other malignant neoplasm of bronchus and lung: Secondary | ICD-10-CM | POA: Diagnosis not present

## 2017-09-14 DIAGNOSIS — E079 Disorder of thyroid, unspecified: Secondary | ICD-10-CM | POA: Diagnosis not present

## 2017-09-17 DIAGNOSIS — I2510 Atherosclerotic heart disease of native coronary artery without angina pectoris: Secondary | ICD-10-CM | POA: Diagnosis not present

## 2017-09-17 DIAGNOSIS — F0390 Unspecified dementia without behavioral disturbance: Secondary | ICD-10-CM | POA: Diagnosis not present

## 2017-09-17 DIAGNOSIS — J449 Chronic obstructive pulmonary disease, unspecified: Secondary | ICD-10-CM | POA: Diagnosis not present

## 2017-09-17 DIAGNOSIS — E079 Disorder of thyroid, unspecified: Secondary | ICD-10-CM | POA: Diagnosis not present

## 2017-09-17 DIAGNOSIS — Z85118 Personal history of other malignant neoplasm of bronchus and lung: Secondary | ICD-10-CM | POA: Diagnosis not present

## 2017-09-18 DIAGNOSIS — F0390 Unspecified dementia without behavioral disturbance: Secondary | ICD-10-CM | POA: Diagnosis not present

## 2017-09-18 DIAGNOSIS — Z85118 Personal history of other malignant neoplasm of bronchus and lung: Secondary | ICD-10-CM | POA: Diagnosis not present

## 2017-09-18 DIAGNOSIS — E079 Disorder of thyroid, unspecified: Secondary | ICD-10-CM | POA: Diagnosis not present

## 2017-09-18 DIAGNOSIS — I2510 Atherosclerotic heart disease of native coronary artery without angina pectoris: Secondary | ICD-10-CM | POA: Diagnosis not present

## 2017-09-18 DIAGNOSIS — J449 Chronic obstructive pulmonary disease, unspecified: Secondary | ICD-10-CM | POA: Diagnosis not present

## 2017-09-19 DIAGNOSIS — F0390 Unspecified dementia without behavioral disturbance: Secondary | ICD-10-CM | POA: Diagnosis not present

## 2017-09-19 DIAGNOSIS — J449 Chronic obstructive pulmonary disease, unspecified: Secondary | ICD-10-CM | POA: Diagnosis not present

## 2017-09-19 DIAGNOSIS — E079 Disorder of thyroid, unspecified: Secondary | ICD-10-CM | POA: Diagnosis not present

## 2017-09-19 DIAGNOSIS — I2510 Atherosclerotic heart disease of native coronary artery without angina pectoris: Secondary | ICD-10-CM | POA: Diagnosis not present

## 2017-09-19 DIAGNOSIS — Z85118 Personal history of other malignant neoplasm of bronchus and lung: Secondary | ICD-10-CM | POA: Diagnosis not present

## 2017-09-20 DIAGNOSIS — J449 Chronic obstructive pulmonary disease, unspecified: Secondary | ICD-10-CM | POA: Diagnosis not present

## 2017-09-20 DIAGNOSIS — I2510 Atherosclerotic heart disease of native coronary artery without angina pectoris: Secondary | ICD-10-CM | POA: Diagnosis not present

## 2017-09-20 DIAGNOSIS — Z85118 Personal history of other malignant neoplasm of bronchus and lung: Secondary | ICD-10-CM | POA: Diagnosis not present

## 2017-09-20 DIAGNOSIS — F0390 Unspecified dementia without behavioral disturbance: Secondary | ICD-10-CM | POA: Diagnosis not present

## 2017-09-20 DIAGNOSIS — E079 Disorder of thyroid, unspecified: Secondary | ICD-10-CM | POA: Diagnosis not present

## 2017-09-21 DIAGNOSIS — J449 Chronic obstructive pulmonary disease, unspecified: Secondary | ICD-10-CM | POA: Diagnosis not present

## 2017-09-21 DIAGNOSIS — E079 Disorder of thyroid, unspecified: Secondary | ICD-10-CM | POA: Diagnosis not present

## 2017-09-21 DIAGNOSIS — F0390 Unspecified dementia without behavioral disturbance: Secondary | ICD-10-CM | POA: Diagnosis not present

## 2017-09-21 DIAGNOSIS — Z85118 Personal history of other malignant neoplasm of bronchus and lung: Secondary | ICD-10-CM | POA: Diagnosis not present

## 2017-09-21 DIAGNOSIS — I2510 Atherosclerotic heart disease of native coronary artery without angina pectoris: Secondary | ICD-10-CM | POA: Diagnosis not present

## 2017-09-24 DIAGNOSIS — F0390 Unspecified dementia without behavioral disturbance: Secondary | ICD-10-CM | POA: Diagnosis not present

## 2017-09-24 DIAGNOSIS — Z85118 Personal history of other malignant neoplasm of bronchus and lung: Secondary | ICD-10-CM | POA: Diagnosis not present

## 2017-09-24 DIAGNOSIS — I2510 Atherosclerotic heart disease of native coronary artery without angina pectoris: Secondary | ICD-10-CM | POA: Diagnosis not present

## 2017-09-24 DIAGNOSIS — E079 Disorder of thyroid, unspecified: Secondary | ICD-10-CM | POA: Diagnosis not present

## 2017-09-24 DIAGNOSIS — J449 Chronic obstructive pulmonary disease, unspecified: Secondary | ICD-10-CM | POA: Diagnosis not present

## 2017-09-25 DIAGNOSIS — I2510 Atherosclerotic heart disease of native coronary artery without angina pectoris: Secondary | ICD-10-CM | POA: Diagnosis not present

## 2017-09-25 DIAGNOSIS — E079 Disorder of thyroid, unspecified: Secondary | ICD-10-CM | POA: Diagnosis not present

## 2017-09-25 DIAGNOSIS — F0390 Unspecified dementia without behavioral disturbance: Secondary | ICD-10-CM | POA: Diagnosis not present

## 2017-09-25 DIAGNOSIS — Z85118 Personal history of other malignant neoplasm of bronchus and lung: Secondary | ICD-10-CM | POA: Diagnosis not present

## 2017-09-25 DIAGNOSIS — J449 Chronic obstructive pulmonary disease, unspecified: Secondary | ICD-10-CM | POA: Diagnosis not present

## 2017-09-26 DIAGNOSIS — I2510 Atherosclerotic heart disease of native coronary artery without angina pectoris: Secondary | ICD-10-CM | POA: Diagnosis not present

## 2017-09-26 DIAGNOSIS — F0390 Unspecified dementia without behavioral disturbance: Secondary | ICD-10-CM | POA: Diagnosis not present

## 2017-09-26 DIAGNOSIS — E079 Disorder of thyroid, unspecified: Secondary | ICD-10-CM | POA: Diagnosis not present

## 2017-09-26 DIAGNOSIS — Z85118 Personal history of other malignant neoplasm of bronchus and lung: Secondary | ICD-10-CM | POA: Diagnosis not present

## 2017-09-26 DIAGNOSIS — J449 Chronic obstructive pulmonary disease, unspecified: Secondary | ICD-10-CM | POA: Diagnosis not present

## 2017-09-27 DIAGNOSIS — F0390 Unspecified dementia without behavioral disturbance: Secondary | ICD-10-CM | POA: Diagnosis not present

## 2017-09-27 DIAGNOSIS — I2510 Atherosclerotic heart disease of native coronary artery without angina pectoris: Secondary | ICD-10-CM | POA: Diagnosis not present

## 2017-09-27 DIAGNOSIS — Z85118 Personal history of other malignant neoplasm of bronchus and lung: Secondary | ICD-10-CM | POA: Diagnosis not present

## 2017-09-27 DIAGNOSIS — E079 Disorder of thyroid, unspecified: Secondary | ICD-10-CM | POA: Diagnosis not present

## 2017-09-27 DIAGNOSIS — J449 Chronic obstructive pulmonary disease, unspecified: Secondary | ICD-10-CM | POA: Diagnosis not present

## 2017-09-28 DIAGNOSIS — C3481 Malignant neoplasm of overlapping sites of right bronchus and lung: Secondary | ICD-10-CM | POA: Diagnosis not present

## 2017-09-29 DEATH — deceased
